# Patient Record
Sex: Female | Born: 1950 | Race: White | Hispanic: No | Smoking: Former smoker
Health system: Southern US, Community
[De-identification: ages and names within clinical notes are randomized; demographics above are authoritative.]

## PROBLEM LIST (undated history)

## (undated) DIAGNOSIS — K635 Polyp of colon: Secondary | ICD-10-CM

## (undated) DIAGNOSIS — D649 Anemia, unspecified: Secondary | ICD-10-CM

## (undated) DIAGNOSIS — K589 Irritable bowel syndrome without diarrhea: Secondary | ICD-10-CM

## (undated) DIAGNOSIS — R110 Nausea: Secondary | ICD-10-CM

## (undated) DIAGNOSIS — E785 Hyperlipidemia, unspecified: Secondary | ICD-10-CM

## (undated) DIAGNOSIS — R002 Palpitations: Secondary | ICD-10-CM

## (undated) DIAGNOSIS — J449 Chronic obstructive pulmonary disease, unspecified: Secondary | ICD-10-CM

## (undated) DIAGNOSIS — N281 Cyst of kidney, acquired: Secondary | ICD-10-CM

## (undated) DIAGNOSIS — D329 Benign neoplasm of meninges, unspecified: Secondary | ICD-10-CM

## (undated) DIAGNOSIS — R911 Solitary pulmonary nodule: Secondary | ICD-10-CM

## (undated) DIAGNOSIS — L570 Actinic keratosis: Secondary | ICD-10-CM

## (undated) DIAGNOSIS — J358 Other chronic diseases of tonsils and adenoids: Secondary | ICD-10-CM

## (undated) HISTORY — DX: Hyperlipidemia, unspecified: E78.5

## (undated) HISTORY — DX: Actinic keratosis: L57.0

## (undated) HISTORY — DX: Solitary pulmonary nodule: R91.1

## (undated) HISTORY — DX: Anemia, unspecified: D64.9

## (undated) HISTORY — DX: Benign neoplasm of meninges, unspecified: D32.9

## (undated) HISTORY — DX: Nausea: R11.0

## (undated) HISTORY — DX: Irritable bowel syndrome, unspecified: K58.9

## (undated) HISTORY — PX: VAGINAL HYSTERECTOMY: SUR661

## (undated) HISTORY — DX: Cyst of kidney, acquired: N28.1

## (undated) HISTORY — DX: Chronic obstructive pulmonary disease, unspecified: J44.9

## (undated) HISTORY — DX: Palpitations: R00.2

## (undated) HISTORY — DX: Polyp of colon: K63.5

## (undated) HISTORY — DX: Other chronic diseases of tonsils and adenoids: J35.8

## (undated) HISTORY — PX: COLONOSCOPY: SHX174

## (undated) HISTORY — PX: ESOPHAGOGASTRODUODENOSCOPY: SHX1529

---

## 1998-09-14 ENCOUNTER — Encounter (INDEPENDENT_AMBULATORY_CARE_PROVIDER_SITE_OTHER): Payer: Self-pay | Admitting: Internal Medicine

## 1998-09-14 LAB — CONVERTED CEMR LAB

## 2001-09-26 ENCOUNTER — Other Ambulatory Visit: Admission: RE | Admit: 2001-09-26 | Discharge: 2001-09-26 | Payer: Self-pay | Admitting: Obstetrics and Gynecology

## 2001-10-03 ENCOUNTER — Encounter: Admission: RE | Admit: 2001-10-03 | Discharge: 2001-10-03 | Payer: Self-pay | Admitting: Obstetrics and Gynecology

## 2001-10-03 ENCOUNTER — Encounter: Payer: Self-pay | Admitting: Obstetrics and Gynecology

## 2002-07-31 ENCOUNTER — Emergency Department (HOSPITAL_COMMUNITY): Admission: EM | Admit: 2002-07-31 | Discharge: 2002-07-31 | Payer: Self-pay

## 2002-10-02 ENCOUNTER — Other Ambulatory Visit: Admission: RE | Admit: 2002-10-02 | Discharge: 2002-10-02 | Payer: Self-pay | Admitting: Obstetrics and Gynecology

## 2002-10-09 ENCOUNTER — Encounter: Admission: RE | Admit: 2002-10-09 | Discharge: 2002-10-09 | Payer: Self-pay | Admitting: Obstetrics and Gynecology

## 2002-10-09 ENCOUNTER — Encounter: Payer: Self-pay | Admitting: Obstetrics and Gynecology

## 2003-10-06 ENCOUNTER — Other Ambulatory Visit: Admission: RE | Admit: 2003-10-06 | Discharge: 2003-10-06 | Payer: Self-pay | Admitting: Obstetrics and Gynecology

## 2003-10-11 ENCOUNTER — Encounter: Admission: RE | Admit: 2003-10-11 | Discharge: 2003-10-11 | Payer: Self-pay | Admitting: Obstetrics and Gynecology

## 2003-10-22 ENCOUNTER — Encounter (HOSPITAL_COMMUNITY): Admission: RE | Admit: 2003-10-22 | Discharge: 2004-01-20 | Payer: Self-pay | Admitting: Family Medicine

## 2003-10-30 LAB — FECAL OCCULT BLOOD, GUAIAC: Fecal Occult Blood: NEGATIVE

## 2004-06-05 ENCOUNTER — Encounter: Admission: RE | Admit: 2004-06-05 | Discharge: 2004-06-05 | Payer: Self-pay | Admitting: Orthopedic Surgery

## 2004-10-11 ENCOUNTER — Ambulatory Visit: Payer: Self-pay | Admitting: Internal Medicine

## 2004-10-12 ENCOUNTER — Encounter: Admission: RE | Admit: 2004-10-12 | Discharge: 2004-10-12 | Payer: Self-pay | Admitting: Family Medicine

## 2004-10-12 ENCOUNTER — Other Ambulatory Visit: Admission: RE | Admit: 2004-10-12 | Discharge: 2004-10-12 | Payer: Self-pay | Admitting: Obstetrics and Gynecology

## 2004-12-15 ENCOUNTER — Ambulatory Visit (HOSPITAL_COMMUNITY): Admission: RE | Admit: 2004-12-15 | Discharge: 2004-12-15 | Payer: Self-pay | Admitting: Endocrinology

## 2004-12-29 ENCOUNTER — Ambulatory Visit (HOSPITAL_COMMUNITY): Admission: RE | Admit: 2004-12-29 | Discharge: 2004-12-29 | Payer: Self-pay | Admitting: Endocrinology

## 2005-01-04 ENCOUNTER — Ambulatory Visit: Payer: Self-pay | Admitting: Family Medicine

## 2005-01-29 ENCOUNTER — Encounter: Admission: RE | Admit: 2005-01-29 | Discharge: 2005-01-29 | Payer: Self-pay | Admitting: General Surgery

## 2005-06-28 ENCOUNTER — Ambulatory Visit: Payer: Self-pay | Admitting: Unknown Physician Specialty

## 2005-07-04 ENCOUNTER — Ambulatory Visit: Payer: Self-pay | Admitting: Unknown Physician Specialty

## 2005-08-13 ENCOUNTER — Ambulatory Visit (HOSPITAL_COMMUNITY): Admission: RE | Admit: 2005-08-13 | Discharge: 2005-08-13 | Payer: Self-pay | Admitting: Endocrinology

## 2005-08-17 ENCOUNTER — Ambulatory Visit: Payer: Self-pay | Admitting: Unknown Physician Specialty

## 2005-09-03 ENCOUNTER — Encounter (INDEPENDENT_AMBULATORY_CARE_PROVIDER_SITE_OTHER): Payer: Self-pay | Admitting: Specialist

## 2005-09-03 ENCOUNTER — Other Ambulatory Visit: Admission: RE | Admit: 2005-09-03 | Discharge: 2005-09-03 | Payer: Self-pay | Admitting: Interventional Radiology

## 2005-09-03 ENCOUNTER — Encounter: Admission: RE | Admit: 2005-09-03 | Discharge: 2005-09-03 | Payer: Self-pay | Admitting: Endocrinology

## 2005-09-05 ENCOUNTER — Ambulatory Visit: Payer: Self-pay | Admitting: Family Medicine

## 2005-10-10 ENCOUNTER — Ambulatory Visit: Payer: Self-pay | Admitting: Family Medicine

## 2005-10-12 ENCOUNTER — Other Ambulatory Visit: Admission: RE | Admit: 2005-10-12 | Discharge: 2005-10-12 | Payer: Self-pay | Admitting: Obstetrics and Gynecology

## 2005-10-12 ENCOUNTER — Encounter: Admission: RE | Admit: 2005-10-12 | Discharge: 2005-10-12 | Payer: Self-pay | Admitting: Obstetrics and Gynecology

## 2005-10-26 ENCOUNTER — Ambulatory Visit: Payer: Self-pay | Admitting: Family Medicine

## 2005-11-13 ENCOUNTER — Ambulatory Visit: Payer: Self-pay | Admitting: Family Medicine

## 2005-11-15 HISTORY — PX: THYROIDECTOMY: SHX17

## 2005-11-19 ENCOUNTER — Ambulatory Visit (HOSPITAL_COMMUNITY): Admission: RE | Admit: 2005-11-19 | Discharge: 2005-11-20 | Payer: Self-pay | Admitting: Surgery

## 2005-11-19 ENCOUNTER — Encounter (INDEPENDENT_AMBULATORY_CARE_PROVIDER_SITE_OTHER): Payer: Self-pay | Admitting: Specialist

## 2006-01-01 ENCOUNTER — Encounter: Admission: RE | Admit: 2006-01-01 | Discharge: 2006-01-01 | Payer: Self-pay | Admitting: Surgery

## 2006-02-18 ENCOUNTER — Ambulatory Visit: Payer: Self-pay | Admitting: Family Medicine

## 2006-03-04 ENCOUNTER — Encounter: Admission: RE | Admit: 2006-03-04 | Discharge: 2006-03-04 | Payer: Self-pay | Admitting: Allergy

## 2006-04-23 ENCOUNTER — Ambulatory Visit: Payer: Self-pay | Admitting: Family Medicine

## 2006-07-26 ENCOUNTER — Ambulatory Visit: Payer: Self-pay | Admitting: Family Medicine

## 2006-10-14 ENCOUNTER — Ambulatory Visit: Payer: Self-pay | Admitting: Family Medicine

## 2006-10-14 ENCOUNTER — Encounter: Admission: RE | Admit: 2006-10-14 | Discharge: 2006-10-14 | Payer: Self-pay | Admitting: Family Medicine

## 2007-01-23 ENCOUNTER — Encounter: Payer: Self-pay | Admitting: Cardiovascular Disease

## 2007-02-05 ENCOUNTER — Encounter (INDEPENDENT_AMBULATORY_CARE_PROVIDER_SITE_OTHER): Payer: Self-pay | Admitting: Internal Medicine

## 2007-02-06 ENCOUNTER — Encounter (INDEPENDENT_AMBULATORY_CARE_PROVIDER_SITE_OTHER): Payer: Self-pay | Admitting: Internal Medicine

## 2007-02-14 ENCOUNTER — Encounter (INDEPENDENT_AMBULATORY_CARE_PROVIDER_SITE_OTHER): Payer: Self-pay | Admitting: Internal Medicine

## 2007-02-25 ENCOUNTER — Encounter (INDEPENDENT_AMBULATORY_CARE_PROVIDER_SITE_OTHER): Payer: Self-pay | Admitting: Internal Medicine

## 2007-02-25 HISTORY — PX: CARDIAC CATHETERIZATION: SHX172

## 2007-02-28 ENCOUNTER — Ambulatory Visit: Payer: Self-pay | Admitting: Otolaryngology

## 2007-02-28 ENCOUNTER — Encounter (INDEPENDENT_AMBULATORY_CARE_PROVIDER_SITE_OTHER): Payer: Self-pay | Admitting: Internal Medicine

## 2007-03-14 ENCOUNTER — Encounter (INDEPENDENT_AMBULATORY_CARE_PROVIDER_SITE_OTHER): Payer: Self-pay | Admitting: Internal Medicine

## 2007-03-18 ENCOUNTER — Ambulatory Visit: Payer: Self-pay | Admitting: Family Medicine

## 2007-03-18 DIAGNOSIS — I471 Supraventricular tachycardia, unspecified: Secondary | ICD-10-CM | POA: Insufficient documentation

## 2007-03-18 DIAGNOSIS — E0590 Thyrotoxicosis, unspecified without thyrotoxic crisis or storm: Secondary | ICD-10-CM | POA: Insufficient documentation

## 2007-03-19 ENCOUNTER — Encounter: Payer: Self-pay | Admitting: Pulmonary Disease

## 2007-03-19 ENCOUNTER — Ambulatory Visit: Payer: Self-pay

## 2007-03-24 ENCOUNTER — Encounter (INDEPENDENT_AMBULATORY_CARE_PROVIDER_SITE_OTHER): Payer: Self-pay | Admitting: Internal Medicine

## 2007-03-28 ENCOUNTER — Encounter: Payer: Self-pay | Admitting: Cardiovascular Disease

## 2007-04-01 ENCOUNTER — Telehealth (INDEPENDENT_AMBULATORY_CARE_PROVIDER_SITE_OTHER): Payer: Self-pay | Admitting: Internal Medicine

## 2007-04-25 ENCOUNTER — Ambulatory Visit: Payer: Self-pay | Admitting: Podiatry

## 2007-04-25 HISTORY — PX: FOOT SURGERY: SHX648

## 2007-05-16 ENCOUNTER — Ambulatory Visit: Payer: Self-pay | Admitting: Family Medicine

## 2007-05-16 LAB — CONVERTED CEMR LAB
Bilirubin Urine: NEGATIVE
Glucose, Urine, Semiquant: NEGATIVE
Ketones, urine, test strip: NEGATIVE
Nitrite: NEGATIVE
Protein, U semiquant: NEGATIVE
Specific Gravity, Urine: 1.02
Urobilinogen, UA: NEGATIVE
pH: 6

## 2007-06-12 ENCOUNTER — Encounter (INDEPENDENT_AMBULATORY_CARE_PROVIDER_SITE_OTHER): Payer: Self-pay | Admitting: Internal Medicine

## 2007-06-19 ENCOUNTER — Ambulatory Visit: Payer: Self-pay | Admitting: Internal Medicine

## 2007-06-19 DIAGNOSIS — J019 Acute sinusitis, unspecified: Secondary | ICD-10-CM | POA: Insufficient documentation

## 2007-07-17 ENCOUNTER — Encounter (INDEPENDENT_AMBULATORY_CARE_PROVIDER_SITE_OTHER): Payer: Self-pay | Admitting: Internal Medicine

## 2007-08-04 ENCOUNTER — Telehealth (INDEPENDENT_AMBULATORY_CARE_PROVIDER_SITE_OTHER): Payer: Self-pay | Admitting: *Deleted

## 2007-08-11 ENCOUNTER — Ambulatory Visit: Payer: Self-pay | Admitting: Family Medicine

## 2007-08-13 ENCOUNTER — Encounter (INDEPENDENT_AMBULATORY_CARE_PROVIDER_SITE_OTHER): Payer: Self-pay | Admitting: Internal Medicine

## 2007-08-13 ENCOUNTER — Telehealth (INDEPENDENT_AMBULATORY_CARE_PROVIDER_SITE_OTHER): Payer: Self-pay | Admitting: *Deleted

## 2007-08-13 DIAGNOSIS — M3500 Sicca syndrome, unspecified: Secondary | ICD-10-CM | POA: Insufficient documentation

## 2007-08-13 DIAGNOSIS — Z860100 Personal history of colon polyps, unspecified: Secondary | ICD-10-CM | POA: Insufficient documentation

## 2007-08-13 DIAGNOSIS — F17200 Nicotine dependence, unspecified, uncomplicated: Secondary | ICD-10-CM | POA: Insufficient documentation

## 2007-08-13 DIAGNOSIS — F410 Panic disorder [episodic paroxysmal anxiety] without agoraphobia: Secondary | ICD-10-CM | POA: Insufficient documentation

## 2007-08-13 DIAGNOSIS — N6019 Diffuse cystic mastopathy of unspecified breast: Secondary | ICD-10-CM | POA: Insufficient documentation

## 2007-08-13 DIAGNOSIS — E039 Hypothyroidism, unspecified: Secondary | ICD-10-CM | POA: Insufficient documentation

## 2007-09-01 ENCOUNTER — Encounter: Admission: RE | Admit: 2007-09-01 | Discharge: 2007-09-01 | Payer: Self-pay | Admitting: Family Medicine

## 2007-09-01 ENCOUNTER — Encounter (INDEPENDENT_AMBULATORY_CARE_PROVIDER_SITE_OTHER): Payer: Self-pay | Admitting: Internal Medicine

## 2007-09-01 ENCOUNTER — Encounter: Admission: RE | Admit: 2007-09-01 | Discharge: 2007-09-01 | Payer: Self-pay | Admitting: Neurosurgery

## 2007-09-15 ENCOUNTER — Encounter (INDEPENDENT_AMBULATORY_CARE_PROVIDER_SITE_OTHER): Payer: Self-pay | Admitting: Internal Medicine

## 2007-09-30 ENCOUNTER — Encounter (INDEPENDENT_AMBULATORY_CARE_PROVIDER_SITE_OTHER): Payer: Self-pay | Admitting: Internal Medicine

## 2007-09-30 ENCOUNTER — Encounter: Admission: RE | Admit: 2007-09-30 | Discharge: 2007-09-30 | Payer: Self-pay | Admitting: Obstetrics and Gynecology

## 2007-09-30 ENCOUNTER — Encounter (INDEPENDENT_AMBULATORY_CARE_PROVIDER_SITE_OTHER): Payer: Self-pay | Admitting: *Deleted

## 2007-10-01 ENCOUNTER — Encounter (INDEPENDENT_AMBULATORY_CARE_PROVIDER_SITE_OTHER): Payer: Self-pay | Admitting: Internal Medicine

## 2007-10-07 ENCOUNTER — Ambulatory Visit: Payer: Self-pay | Admitting: Family Medicine

## 2007-10-07 DIAGNOSIS — M6281 Muscle weakness (generalized): Secondary | ICD-10-CM | POA: Insufficient documentation

## 2007-10-07 DIAGNOSIS — M25559 Pain in unspecified hip: Secondary | ICD-10-CM | POA: Insufficient documentation

## 2007-10-08 ENCOUNTER — Encounter (INDEPENDENT_AMBULATORY_CARE_PROVIDER_SITE_OTHER): Payer: Self-pay | Admitting: Internal Medicine

## 2008-03-02 ENCOUNTER — Telehealth (INDEPENDENT_AMBULATORY_CARE_PROVIDER_SITE_OTHER): Payer: Self-pay | Admitting: Internal Medicine

## 2008-03-22 ENCOUNTER — Ambulatory Visit: Payer: Self-pay | Admitting: Family Medicine

## 2008-03-22 ENCOUNTER — Telehealth (INDEPENDENT_AMBULATORY_CARE_PROVIDER_SITE_OTHER): Payer: Self-pay | Admitting: Internal Medicine

## 2008-03-22 DIAGNOSIS — M2550 Pain in unspecified joint: Secondary | ICD-10-CM | POA: Insufficient documentation

## 2008-03-22 DIAGNOSIS — E162 Hypoglycemia, unspecified: Secondary | ICD-10-CM | POA: Insufficient documentation

## 2008-03-23 ENCOUNTER — Encounter (INDEPENDENT_AMBULATORY_CARE_PROVIDER_SITE_OTHER): Payer: Self-pay | Admitting: Internal Medicine

## 2008-03-26 ENCOUNTER — Telehealth (INDEPENDENT_AMBULATORY_CARE_PROVIDER_SITE_OTHER): Payer: Self-pay | Admitting: Internal Medicine

## 2008-04-13 ENCOUNTER — Encounter (INDEPENDENT_AMBULATORY_CARE_PROVIDER_SITE_OTHER): Payer: Self-pay | Admitting: Internal Medicine

## 2008-04-14 DIAGNOSIS — IMO0002 Reserved for concepts with insufficient information to code with codable children: Secondary | ICD-10-CM | POA: Insufficient documentation

## 2008-04-14 DIAGNOSIS — M1710 Unilateral primary osteoarthritis, unspecified knee: Secondary | ICD-10-CM

## 2008-04-14 DIAGNOSIS — E559 Vitamin D deficiency, unspecified: Secondary | ICD-10-CM | POA: Insufficient documentation

## 2008-04-14 DIAGNOSIS — M1990 Unspecified osteoarthritis, unspecified site: Secondary | ICD-10-CM | POA: Insufficient documentation

## 2008-08-06 ENCOUNTER — Ambulatory Visit: Payer: Self-pay | Admitting: Family Medicine

## 2008-08-06 DIAGNOSIS — R413 Other amnesia: Secondary | ICD-10-CM | POA: Insufficient documentation

## 2008-08-16 ENCOUNTER — Ambulatory Visit: Payer: Self-pay | Admitting: Pulmonary Disease

## 2008-08-16 DIAGNOSIS — G4733 Obstructive sleep apnea (adult) (pediatric): Secondary | ICD-10-CM | POA: Insufficient documentation

## 2008-08-18 ENCOUNTER — Telehealth (INDEPENDENT_AMBULATORY_CARE_PROVIDER_SITE_OTHER): Payer: Self-pay | Admitting: *Deleted

## 2008-08-19 ENCOUNTER — Ambulatory Visit: Payer: Self-pay | Admitting: Family Medicine

## 2008-08-19 ENCOUNTER — Encounter (INDEPENDENT_AMBULATORY_CARE_PROVIDER_SITE_OTHER): Payer: Self-pay | Admitting: Internal Medicine

## 2008-08-30 ENCOUNTER — Encounter: Admission: RE | Admit: 2008-08-30 | Discharge: 2008-08-30 | Payer: Self-pay | Admitting: Neurosurgery

## 2008-09-01 ENCOUNTER — Telehealth: Payer: Self-pay | Admitting: Pulmonary Disease

## 2008-09-13 ENCOUNTER — Encounter: Admission: RE | Admit: 2008-09-13 | Discharge: 2008-09-13 | Payer: Self-pay | Admitting: Family Medicine

## 2008-09-13 ENCOUNTER — Ambulatory Visit: Payer: Self-pay | Admitting: Pulmonary Disease

## 2008-09-14 ENCOUNTER — Encounter (INDEPENDENT_AMBULATORY_CARE_PROVIDER_SITE_OTHER): Payer: Self-pay | Admitting: *Deleted

## 2008-09-17 ENCOUNTER — Encounter (INDEPENDENT_AMBULATORY_CARE_PROVIDER_SITE_OTHER): Payer: Self-pay | Admitting: Internal Medicine

## 2008-09-29 ENCOUNTER — Ambulatory Visit: Payer: Self-pay | Admitting: Unknown Physician Specialty

## 2008-09-29 ENCOUNTER — Encounter (INDEPENDENT_AMBULATORY_CARE_PROVIDER_SITE_OTHER): Payer: Self-pay | Admitting: Internal Medicine

## 2008-10-06 ENCOUNTER — Telehealth (INDEPENDENT_AMBULATORY_CARE_PROVIDER_SITE_OTHER): Payer: Self-pay | Admitting: *Deleted

## 2008-10-28 ENCOUNTER — Encounter (INDEPENDENT_AMBULATORY_CARE_PROVIDER_SITE_OTHER): Payer: Self-pay | Admitting: Internal Medicine

## 2008-10-29 ENCOUNTER — Telehealth: Payer: Self-pay | Admitting: Pulmonary Disease

## 2008-11-16 ENCOUNTER — Telehealth: Payer: Self-pay | Admitting: Pulmonary Disease

## 2008-11-23 ENCOUNTER — Ambulatory Visit: Payer: Self-pay | Admitting: Family Medicine

## 2008-11-23 ENCOUNTER — Encounter (INDEPENDENT_AMBULATORY_CARE_PROVIDER_SITE_OTHER): Payer: Self-pay | Admitting: Internal Medicine

## 2008-11-25 ENCOUNTER — Encounter (INDEPENDENT_AMBULATORY_CARE_PROVIDER_SITE_OTHER): Payer: Self-pay | Admitting: *Deleted

## 2009-03-04 ENCOUNTER — Encounter (INDEPENDENT_AMBULATORY_CARE_PROVIDER_SITE_OTHER): Payer: Self-pay | Admitting: Internal Medicine

## 2009-04-22 ENCOUNTER — Encounter (INDEPENDENT_AMBULATORY_CARE_PROVIDER_SITE_OTHER): Payer: Self-pay | Admitting: Internal Medicine

## 2009-06-06 ENCOUNTER — Encounter (INDEPENDENT_AMBULATORY_CARE_PROVIDER_SITE_OTHER): Payer: Self-pay | Admitting: Internal Medicine

## 2009-06-28 ENCOUNTER — Encounter (INDEPENDENT_AMBULATORY_CARE_PROVIDER_SITE_OTHER): Payer: Self-pay | Admitting: Internal Medicine

## 2009-06-29 ENCOUNTER — Ambulatory Visit: Payer: Self-pay | Admitting: Family Medicine

## 2009-07-05 ENCOUNTER — Telehealth (INDEPENDENT_AMBULATORY_CARE_PROVIDER_SITE_OTHER): Payer: Self-pay | Admitting: Internal Medicine

## 2009-07-05 LAB — CONVERTED CEMR LAB
BUN: 6 mg/dL (ref 6–23)
Basophils Absolute: 0 10*3/uL (ref 0.0–0.1)
Basophils Relative: 0.4 % (ref 0.0–3.0)
CO2: 29 meq/L (ref 19–32)
Calcium: 8.8 mg/dL (ref 8.4–10.5)
Chloride: 105 meq/L (ref 96–112)
Creatinine, Ser: 0.6 mg/dL (ref 0.4–1.2)
Eosinophils Absolute: 0 10*3/uL (ref 0.0–0.7)
Eosinophils Relative: 1.2 % (ref 0.0–5.0)
GFR calc non Af Amer: 108.63 mL/min (ref >60)
Glucose, Bld: 83 mg/dL (ref 70–99)
HCT: 38.3 % (ref 36.0–46.0)
Hemoglobin: 13 g/dL (ref 12.0–15.0)
Lymphocytes Relative: 33.2 % (ref 12.0–46.0)
Lymphs Abs: 1.1 10*3/uL (ref 0.7–4.0)
MCHC: 33.8 g/dL (ref 30.0–36.0)
MCV: 98.3 fL (ref 78.0–100.0)
Monocytes Absolute: 0.3 10*3/uL (ref 0.1–1.0)
Monocytes Relative: 8 % (ref 3.0–12.0)
Neutro Abs: 1.8 10*3/uL (ref 1.4–7.7)
Neutrophils Relative %: 57.2 % (ref 43.0–77.0)
Platelets: 149 10*3/uL — ABNORMAL LOW (ref 150.0–400.0)
Potassium: 4.4 meq/L (ref 3.5–5.1)
RBC: 3.9 M/uL (ref 3.87–5.11)
RDW: 12.1 % (ref 11.5–14.6)
Sodium: 142 meq/L (ref 135–145)
WBC: 3.2 10*3/uL — ABNORMAL LOW (ref 4.5–10.5)

## 2009-07-25 ENCOUNTER — Encounter (INDEPENDENT_AMBULATORY_CARE_PROVIDER_SITE_OTHER): Payer: Self-pay | Admitting: Internal Medicine

## 2009-08-09 ENCOUNTER — Encounter (INDEPENDENT_AMBULATORY_CARE_PROVIDER_SITE_OTHER): Payer: Self-pay | Admitting: Internal Medicine

## 2009-08-11 ENCOUNTER — Telehealth (INDEPENDENT_AMBULATORY_CARE_PROVIDER_SITE_OTHER): Payer: Self-pay | Admitting: Internal Medicine

## 2009-08-18 ENCOUNTER — Encounter (INDEPENDENT_AMBULATORY_CARE_PROVIDER_SITE_OTHER): Payer: Self-pay | Admitting: Internal Medicine

## 2009-08-19 ENCOUNTER — Ambulatory Visit: Payer: Self-pay | Admitting: Family Medicine

## 2009-08-19 DIAGNOSIS — M8580 Other specified disorders of bone density and structure, unspecified site: Secondary | ICD-10-CM | POA: Insufficient documentation

## 2009-08-19 DIAGNOSIS — E785 Hyperlipidemia, unspecified: Secondary | ICD-10-CM | POA: Insufficient documentation

## 2009-09-16 ENCOUNTER — Encounter: Admission: RE | Admit: 2009-09-16 | Discharge: 2009-09-16 | Payer: Self-pay | Admitting: Obstetrics and Gynecology

## 2009-09-23 ENCOUNTER — Encounter (INDEPENDENT_AMBULATORY_CARE_PROVIDER_SITE_OTHER): Payer: Self-pay | Admitting: Internal Medicine

## 2009-10-17 ENCOUNTER — Telehealth (INDEPENDENT_AMBULATORY_CARE_PROVIDER_SITE_OTHER): Payer: Self-pay | Admitting: Internal Medicine

## 2010-01-26 ENCOUNTER — Encounter: Payer: Self-pay | Admitting: Cardiovascular Disease

## 2010-02-13 ENCOUNTER — Telehealth: Payer: Self-pay | Admitting: Cardiovascular Disease

## 2010-02-24 ENCOUNTER — Telehealth: Payer: Self-pay | Admitting: Cardiovascular Disease

## 2010-02-24 ENCOUNTER — Ambulatory Visit: Payer: Self-pay | Admitting: Cardiovascular Disease

## 2010-02-24 DIAGNOSIS — R000 Tachycardia, unspecified: Secondary | ICD-10-CM | POA: Insufficient documentation

## 2010-02-27 ENCOUNTER — Encounter: Payer: Self-pay | Admitting: Cardiovascular Disease

## 2010-03-22 ENCOUNTER — Telehealth: Payer: Self-pay | Admitting: Cardiovascular Disease

## 2010-03-28 ENCOUNTER — Encounter: Payer: Self-pay | Admitting: Cardiovascular Disease

## 2010-03-28 ENCOUNTER — Telehealth: Payer: Self-pay | Admitting: Family Medicine

## 2010-03-29 ENCOUNTER — Telehealth: Payer: Self-pay | Admitting: Family Medicine

## 2010-04-06 ENCOUNTER — Encounter: Payer: Self-pay | Admitting: Cardiovascular Disease

## 2010-04-12 ENCOUNTER — Telehealth: Payer: Self-pay | Admitting: Cardiovascular Disease

## 2010-06-08 ENCOUNTER — Telehealth: Payer: Self-pay | Admitting: Cardiovascular Disease

## 2010-06-13 ENCOUNTER — Ambulatory Visit: Payer: Self-pay | Admitting: Family Medicine

## 2010-06-15 ENCOUNTER — Telehealth: Payer: Self-pay | Admitting: Family Medicine

## 2010-08-18 ENCOUNTER — Encounter: Payer: Self-pay | Admitting: Family Medicine

## 2010-08-23 ENCOUNTER — Telehealth: Payer: Self-pay | Admitting: Family Medicine

## 2010-08-28 ENCOUNTER — Ambulatory Visit: Payer: Self-pay | Admitting: Cardiovascular Disease

## 2010-08-29 ENCOUNTER — Ambulatory Visit: Payer: Self-pay | Admitting: Family Medicine

## 2010-08-29 DIAGNOSIS — R3915 Urgency of urination: Secondary | ICD-10-CM | POA: Insufficient documentation

## 2010-08-29 LAB — CONVERTED CEMR LAB
Bilirubin Urine: NEGATIVE
Blood in Urine, dipstick: NEGATIVE
Glucose, Urine, Semiquant: NEGATIVE
Ketones, urine, test strip: NEGATIVE
Nitrite: NEGATIVE
Specific Gravity, Urine: 1.015
Urobilinogen, UA: 0.2
WBC Urine, dipstick: NEGATIVE
pH: 7

## 2010-09-10 ENCOUNTER — Encounter: Admission: RE | Admit: 2010-09-10 | Discharge: 2010-09-10 | Payer: Self-pay | Admitting: Neurosurgery

## 2010-09-22 ENCOUNTER — Encounter
Admission: RE | Admit: 2010-09-22 | Discharge: 2010-09-22 | Payer: Self-pay | Source: Home / Self Care | Attending: Family Medicine | Admitting: Family Medicine

## 2010-09-22 ENCOUNTER — Encounter: Payer: Self-pay | Admitting: Family Medicine

## 2010-11-10 ENCOUNTER — Encounter: Payer: Self-pay | Admitting: Family Medicine

## 2010-11-14 NOTE — Progress Notes (Signed)
Summary: discussion about bipap...will d/c  Phone Note Call from Patient   Caller: Patient Call For: Keasia Dubose Summary of Call: wants to talk to dr Jettie Mannor re: bi-pap. best time to reach pt is between 12:30-1:00pm at [phone] or 4:45pm at [phone] or [phone]. Initial call taken by: Tivis Ringer,  November 16, 2008 10:36 AM  Follow-up for Phone Call        called and spoke with pt.  Pt is calling back regarding the message she left on 10-29-2008.  Pt states she is still having problems with bipap machine.  Pt states after approx 2 hours of wearing bipap the pressue goes up and ends up blowing mask off her face.  pt would like to discuss other options.    Pt states it's best to reach her between 12:30 to 1 at [phone]  or after  4:30pm at [phone] or [phone].  Please advise .Aundra Millet Reynolds LPN  November 16, 2008 10:52 AM   Additional Follow-up for Phone Call Additional follow up Details #1::        Finally able to speak with pt.  she is having continued intolerance to the pressure even with low level bipap.  I have had a long discussion with her...she and I have both come to the conclusion this is not a viable therapy for her.  she has been successful at losing weight, and is continuing to do so.  I also discussed with her the possibility of nasal surgery.  She will think about this.  I will send an order to dme to d/c bipap Additional Follow-up by: Barbaraann Share MD,  November 17, 2008 5:45 PM

## 2010-11-14 NOTE — Progress Notes (Signed)
Summary: needs order for labwork  Phone Note Call from Patient Call back at [phone]   Caller: Patient Summary of Call: Pt is coming in november for her physical.  She is asking that order for labwork be mailed to her home.  She works at [employer]. Initial call taken by: Lowella Petties CMA,  March 29, 2010 10:09 AM  Follow-up for Phone Call        In my box. Follow-up by: Ruthe Mannan MD,  March 29, 2010 10:16 AM  Additional Follow-up for Phone Call Additional follow up Details #1::        Order mailed to patient's home address. Additional Follow-up by: Linde Gillis CMA Duncan Dull),  March 29, 2010 10:23 AM

## 2010-11-14 NOTE — Letter (Signed)
Summary: Results Follow up Letter  [place] [facility]  [address] [place], [place] [address]   Phone: [phone]  Fax: [phone]    09/14/2008 MRN: [id number]        [facility] [address] [place], [place]  [address]      Dear Meghan Mack,  The following are the results of your recent test(s):  Test         Result    Pap Smear:        Normal _____  Not Normal _____ Comments: ______________________________________________________ Cholesterol: LDL(Bad cholesterol):         Your goal is less than:         HDL (Good cholesterol):       Your goal is more than: Comments:  ______________________________________________________ Mammogram:        Normal __X___  Not Normal _____ Comments:  ___________________________________________________________________ Hemoccult:        Normal _____  Not normal _______ Comments:    _____________________________________________________________________ Other Tests:    We routinely do not discuss normal results over the telephone.  If you desire a copy of the results, or you have any questions about this information we can discuss them at your next office visit.   Sincerely,   Billie-Lynn Garner Nash Bean,FNP

## 2010-11-14 NOTE — Assessment & Plan Note (Signed)
Summary: cpx/cmt   Vital Signs:  Patient Profile:   60 Years Old Female Height:     66.75 inches Weight:      168 pounds Temp:     97.9 degrees F oral Pulse rate:   72 / minute Pulse rhythm:   regular BP sitting:   120 / 82  (left arm) Cuff size:   regular  Vitals Entered By: Lowella Petties (August 06, 2008 8:45 AM)                 Chief Complaint:  CPX.  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  was found to have hypo Vit D and started on 2000mg  of vit D daily.  --to see Dr Jule Ser in 12/09--if no increase this time in size of meningioma will be seen in 1 yr--MRI to be done 11/09.  --continues to wake gasping for air, not nightly but 3xwk.  Had sleep study done this past summer in [place], ordered by cardio--Dr McQueen--[facility]--now Dr Sonnie Alamo will call them for referral.  --daughter has noted memory loss, no one at work has said anything--last eval 4/09--excellent.  See ROS.  --has stopped paxil for 2wks--wants to be off due to wanting to loose wt--was taking for anxiety, so far so good.  has joined [employer] and is loosing.    Current Allergies: ! SULFA ! CODEINE ! * NEXIUM ! * DECONGESTANTS ! PROPANTHELINE BROMIDE (PROPANTHELINE BROMIDE)  Past Surgical History:    Reviewed history from 09/29/2007 and no changes required:       R foot surgery 04/25/07       Hysterectomy (ovaries intact)              EGD - gastritis-- 01/99/// 11/06       Colonoscopy negative-- 01/99/// polyps 11/06       DEXA - wn-- 02/13/02       MRI L/S basically-- wnl       MRI pelvis, right hip ? lesion        Thyroidectomy, total--02/07       MRI brain- meningeal based mass--02/28/07       EMG--egative--02/14/07       Cardiac cath-- negative --02/25/07       2 D Echo- borderline mitral valve prolapse with MR--5/03       Holter  negative-- 1995/// palpitations-- 04/08                                                                                                        MRI brain with and without contrast--09/01/07--[facility]--probalbl neg   Social History:    Reviewed history from 03/18/2007 and no changes required:       Marital Status: Married       Children: 2       Occupation: Journalist, newspaper, [employer]   Risk Factors:  Tobacco use:  quit    Year quit:  1998 Passive smoke exposure:  no Drug use:  no HIV high-risk behavior:  no Caffeine use:  2 drinks per day Alcohol use:  no Exercise:  yes    Times per week:  5    Type:  walks 5xwk for Seatbelt use:  100 % Sun Exposure:  rarely  Mammogram History:    Date of Last Mammogram:  10/14/2006  PAP Smear History:    Date of Last PAP Smear:  09/14/1998   Review of Systems  Eyes      eye doc 3/09--no cats, ? early glaucoma--new rx needed  CV      Complains of palpitations.      Denies chest pain or discomfort, swelling of feet, and swelling of hands.      Beta blocker helping with increased rate  Resp      See HPI      Denies cough, shortness of breath, and wheezing.  GI      Complains of constipation.      Denies abdominal pain, bloody stools, diarrhea, nausea, and vomiting.      colonoscopy due 12/09--Dr Elliott--had polyps last time done  MS      See HPI  Derm      Denies lesion(s) and rash.      uses sun skin at times of increased exposure  Neuro      Complains of difficulty with concentration and memory loss.      Denies disturbances in coordination, falling down, tremors, and weakness.      not sleeping well --memory--problems with birthdates--new, has not gotten lost in car, does not burn food, husband pays the bills,   Psych      Denies anxiety and depression.   Physical Exam  General:     alert, well-developed, well-nourished, and well-hydrated.  10 lb wt loss since 6/09 Neck:     thyroid surgically  absentno masses, no JVD, and no carotid bruits.   Lungs:     normal respiratory effort, no intercostal retractions, no accessory muscle use, and normal breath sounds.   Heart:     normal rate, regular rhythm, and no murmur.   Abdomen:     soft, non-tender, normal bowel sounds, no distention, no masses, no guarding, no abdominal hernia, no inguinal hernia, no hepatomegaly, and no splenomegaly.   Msk:     no joint tenderness, no joint swelling, no joint warmth, and no redness over joints.  both knees have boggy appearance Extremities:     no edema either lower  leg Neurologic:     alert & oriented X3, strength normal in all extremities, sensation intact to light touch, gait normal, and DTRs symmetrical and normal.   Skin:     turgor normal, color normal, and no rashes.   Cervical Nodes:     no anterior cervical adenopathy and no posterior cervical adenopathy.   Inguinal Nodes:     no R inguinal adenopathy and no L inguinal adenopathy.   Psych:     normally interactive.      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) Assessment: New will schedule in for memory testing all blood work except RPR has been done recently by Dr Jimmy Footman or myself to have MRI of head in 11/09--had one within last yr--neg  except for miningioma  Problem # 3:  VITAMIN D DEFICIENCY (ICD-268.9) Assessment: New taking additional vit  D per Dr Selinda Eon note of 7/09 was reviewed with her  Problem # 4:  PANIC ATTACK (ICD-300.01) Assessment: Comment Only took herself off paxil as she felt it contributed to her inability to loose wt--follow and restart if needed The following medications were removed from the medication list:    Paxil Cr 12.5 Mg Tb24 (Paroxetine hcl) .Marland Kitchen... 1 once daily   Complete Medication List: 1)  Bystolic 2.5mg   .... Take 1 tablet by mouth once a day 2)   Prevacid 30 Mg Cpdr (Lansoprazole) .... Take one by mouth once a day 3)  Zocor 20 Mg Tabs (Simvastatin) .... Take 1 tablet by mouth once a day 4)  Synthroid 75 Mcg Tabs (Levothyroxine sodium) .... Take 1 tablet by mouth once a day   Patient Instructions: 1)  schedule in for memory testing--30 min   ] Prior Medications (reviewed today): BYSTOLIC  2.5MG  () Take 1 tablet by mouth once a day PREVACID 30 MG  CPDR (LANSOPRAZOLE) Take one by mouth once a day ZOCOR 20 MG  TABS (SIMVASTATIN) Take 1 tablet by mouth once a day SYNTHROID 75 MCG  TABS (LEVOTHYROXINE SODIUM) Take 1 tablet by mouth once a day Current Allergies: ! SULFA ! CODEINE ! * NEXIUM ! * DECONGESTANTS ! PROPANTHELINE BROMIDE (PROPANTHELINE BROMIDE)

## 2010-11-14 NOTE — Progress Notes (Signed)
Summary: rxn to pnemonia vacc  Phone Note Call from Patient Call back at Work Phone [phone]   Caller: Patient Call For: bean Summary of Call: pt seen mon for pnemonia vacc she says her arm is extremely sore, but today in front of arm and down into armpit she has broken out in a rash that is really sore, no redness around injection site. please advise Initial call taken by: Liane Comber,  August 13, 2007 8:48 AM  Follow-up for Phone Call        needs to come in if not better tomorrow  ..................................................................Marland KitchenBillie-Lynn Tyler Deis FNP  August 13, 2007 1:47 PM   Additional Follow-up for Phone Call Additional follow up Details #1::        Advised patient.  ......................................................Marland KitchenLiane Comber August 13, 2007 2:02 PM

## 2010-11-14 NOTE — Letter (Signed)
Summary: Medical Record Release  Medical Record Release   Imported By: Harlon Flor 02/22/2010 14:14:46  _____________________________________________________________________  External Attachment:    Type:   Image     Comment:   External Document

## 2010-11-14 NOTE — Progress Notes (Signed)
Summary: pnemonia shot booster?  Phone Note Call from Patient Call back at Work Phone [phone]   Caller: Patient Call For: bean Summary of Call: pt had a pnemonia shot in 2002, pt wants to know if she needs a booster shot since it has been 6 years. Initial call taken by: Liane Comber,  August 04, 2007 1:25 PM  Follow-up for Phone Call        she can have 2 before the age of 40 and 1 after 25....can you help her with this--is ok to have if she meets this ..................................................................Marland KitchenBillie-Lynn Tyler Deis FNP  August 04, 2007 1:53 PM   Additional Follow-up for Phone Call Additional follow up Details #1::        Advised patient.  ......................................................Marland KitchenLiane Comber August 04, 2007 2:02 PM

## 2010-11-14 NOTE — Progress Notes (Signed)
Summary: ? Tamiflu  Phone Note Call from Patient Call back at Work Phone [phone]   Caller: Patient Call For: Everrett Coombe, FNP Summary of Call: Patient states that her grandchildren have been diagnosed at their Pediatrician's office with "flu".  She has been exposed to them.  Should she begin Tamiflu? Initial call taken by: Delilah Shan,  Mar 02, 2008 1:56 PM  Follow-up for Phone Call        --do we know what kind of "flu" the grandchildren have? --do9es not need Tamiflu unless intimate contact or begins to have symptoms   Billie-Lynn Tyler Deis FNP  Mar 02, 2008 2:01 PM   Additional Follow-up for Phone Call Additional follow up Details #1::        pc to pt, given instructions. Additional Follow-up by: Cooper Render,  Mar 02, 2008 2:57 PM

## 2010-11-14 NOTE — Assessment & Plan Note (Signed)
Summary: CPX/OK'D BY DR [place] Inthavong/CLE   Vital Signs:  Patient profile:   60 year old female Height:      67 inches Weight:      153 pounds BMI:     24.05 Temp:     97.5 degrees F oral Pulse rate:   80 / minute Pulse rhythm:   regular BP sitting:   140 / 80  (left arm) Cuff size:   regular  Vitals Entered By: Linde Gillis CMA Duncan Dull) (August 29, 2010 8:04 AM) CC: complete physicial, no pap   History of Present Illness: 60 yo here for CPX.  Labs reviewed with her- cholesterol, liver function, BMEt and Vit D look excellent. She would like to continue higher dose Vitamin D for a few more weeks because she had been in 2000 International Units daily and Vit D was very low.  Immunizations UTD. Scheduled for mammogram in december, pap smear tomorrow.    Urinary frequency- started a few days ago, has vaginal itching as well.  No dysuria, back pain, nause or vomiting.  No fevers.    Anxiety- has had panic attacks for years, used to be on Paxil. Now anxiety is much improved but under a great deal of stress at work and planning her son's wedding. Notices that certain situations make her very anxious and trigger a panic attack, a few times per month. Denies any symptoms of depression. No SI or HI. Does not want to take something daily.  Was ok at her son's wedding, no panic attacks.  Current Medications (verified): 1)  Bystolic 5 Mg Tabs (Nebivolol Hcl) .... Take 1/2 Tablet By Mouth Once A Day 2)  Synthroid 88 Mcg Tabs (Levothyroxine Sodium) .... Take 1 By Mouth Once Daily 3)  Nasonex 50 Mcg/act Susp (Mometasone Furoate) .... One Spray Each Nostril Once or Twice Daily 4)  Prevacid 15 Mg Cpdr (Lansoprazole) .... Take One To Two Tabs Daily As Needed 5)  Zocor 20 Mg Tabs (Simvastatin) .... Take One Tablet Daily 6)  Zantac 150 Mg Tabs (Ranitidine Hcl) .... As Needed 7)  Multivitamins   Tabs (Multiple Vitamin) .... One Tab Once Daily 8)  Probiotic  Caps (Probiotic Product) .... Once Daily 9)   Valium 5 Mg Tabs (Diazepam) .Marland Kitchen.. 1 Tab By Mouth Two Times A Day As Needed Anxiety 10)  Vitamin D3 50000 Unit Caps (Cholecalciferol) .Marland Kitchen.. 1 Tab By Mouth Weekly X 8 Weeks.  Allergies: 1)  ! Sulfa 2)  ! Codeine 3)  ! * Nexium 4)  ! * Decongestants 5)  ! Propantheline Bromide (Propantheline Bromide) 6)  ! Demerol (Meperidine Hcl) 7)  ! Darvocet  Past History:  Past Medical History: Last updated: 08/13/2007 COPD Colonic polyps, hx of  Past Surgical History: Last updated: 10/28/2008 R foot surgery 04/25/07 Hysterectomy (ovaries intact)  EGD - gastritis-- 01/99/// 11/06 Colonoscopy negative-- 01/99/// polyps 11/06--polyps 09/29/08 DEXA - wn-- 02/13/02 MRI L/S basically-- wnl MRI pelvis, right hip ? lesion  Thyroidectomy, total--02/07 MRI brain- meningeal based mass--02/28/07---minimal increase in mass 08/30/08 EMG--egative--02/14/07 Cardiac cath-- negative --02/25/07 2 D Echo- borderline mitral valve prolapse with MR--5/03 Holter negative-- 1995/// palpitations-- 04/08  MRI brain with and without contrast--09/01/07--[facility]--probalbl neg  Family History: Last updated: 02/07/2010 Family history - CAD, elevated BP, CVA, DM, lung cancer, COPD, palpitations  emphysema: father allergies: daughter heart disease: father rheumatism: mother cancer: brother (lung), mother (colon), father (leukemia), maternal grandfather (stomach), Maunt--melanoma   Social History: Last updated: 08/16/2008 Marital Status: Married Children: 2 Occupation: Journalist, newspaper, Clinical cytogeneticist 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   Risk Factors: Alcohol Use: 0 (08/19/2009) Caffeine Use: 3 (08/19/2009) Exercise: yes (08/19/2009)  Risk Factors: Smoking Status: quit (08/19/2009) Packs/Day: 0.5 (08/19/2009) Passive Smoke Exposure: no (08/06/2008)  Review of Systems      See HPI General:  Denies  malaise. Eyes:  Denies blurring. ENT:  Denies difficulty swallowing. CV:  Denies chest pain or discomfort. Resp:  Denies shortness of breath. GI:  Denies abdominal pain and bloody stools. GU:  Complains of urinary frequency; denies discharge and dysuria. MS:  Denies joint pain, joint redness, and joint swelling. Derm:  Denies rash. Neuro:  Denies headaches. Psych:  Complains of anxiety; denies depression. Endo:  Denies cold intolerance and heat intolerance.  Physical Exam  General:  well-appearing woman in no apparent distress, pleasant. Head:  Normocephalic and atraumatic without obvious abnormalities. No apparent alopecia or balding. Eyes:  vision grossly intact, pupils equal, pupils round, and pupils reactive to light.   Ears:  R ear normal and L ear normal.   Nose:  no external deformity.   Mouth:  good dentition.   Neck:  No deformities, masses, or tenderness noted. Lungs:  Normal respiratory effort, chest expands symmetrically. Lungs are clear to auscultation, no crackles or wheezes. Heart:  Normal rate and regular rhythm. S1 and S2 normal without gallop, murmur, click, rub or other extra sounds. Abdomen:  Bowel sounds positive,abdomen soft and non-tender without masses, organomegaly or hernias noted. NO CVA or suprapubic tenderness Msk:  No deformity or scoliosis noted of thoracic or lumbar spine.   Extremities:  no edema Neurologic:  alert & oriented X3 and gait normal.   Skin:  Intact without suspicious lesions or rashes Psych:  Cognition and judgment appear intact. Alert and cooperative with normal attention span and concentration. No apparent delusions, illusions, hallucinations   Impression & Recommendations:  Problem # 1:  WELL ADULT EXAM (ICD-V70.0) Reviewed preventive care protocols, scheduled due services, and updated immunizations Discussed nutrition, exercise, diet, and healthy lifestyle.  UTD on prevention.  Discussed labs.  Problem # 2:  URINARY URGENCY  (ZOX-096.04) Assessment: New UA neg. ? vaginal infection.   she deffered vaginal exam as she has appt with OBGYN tomorrow.    Complete Medication List: 1)  Bystolic 5 Mg Tabs (Nebivolol hcl) .... Take 1/2 tablet by mouth once a day 2)  Synthroid 88 Mcg Tabs (Levothyroxine sodium) .... Take 1 by mouth once daily 3)  Nasonex 50 Mcg/act Susp (Mometasone furoate) .... One spray each nostril once or twice daily 4)  Prevacid 15 Mg Cpdr (Lansoprazole) .... Take one to two tabs daily as needed 5)  Zocor 20 Mg Tabs (Simvastatin) .... Take one tablet daily 6)  Zantac 150 Mg Tabs (Ranitidine hcl) .... As needed 7)  Multivitamins Tabs (Multiple vitamin) .... One tab once daily 8)  Probiotic Caps (Probiotic product) .... Once daily 9)  Valium 5 Mg Tabs (Diazepam) .Marland Kitchen.. 1 tab by mouth two times a day as needed anxiety 10)  Vitamin D3 50000 Unit Caps (Cholecalciferol) .Marland Kitchen.. 1 tab by mouth weekly x 8 weeks.  Other Orders: UA Dipstick w/o Micro (manual) (54098)  Prescriptions: VITAMIN D3 50000 UNIT CAPS (CHOLECALCIFEROL) 1 tab by mouth weekly x 8 weeks.  #8 x 0   Entered and Authorized by:   Ruthe Mannan MD   Signed by:   Ruthe Mannan MD on 08/29/2010   Method used:   Electronically to        [facility] [id number][facility] (retail)       [address] [place], [place]  [address]       Ph: [phone] or [phone]       Fax: [phone]   RxID:   [id number]    Orders Added: 1)  UA Dipstick w/o Micro (manual) [81002] 2)  Est. Patient 40-64 years [34742]    Current Allergies (reviewed today): ! SULFA ! CODEINE ! * NEXIUM ! * DECONGESTANTS ! PROPANTHELINE BROMIDE (PROPANTHELINE BROMIDE) ! DEMEROL (MEPERIDINE HCL) ! DARVOCET    Laboratory Results   Urine Tests  Date/Time Received: August 29, 2010 8:25 AM   Routine Urinalysis   Color: yellow Appearance: Clear Glucose: negative   (Normal Range: Negative) Bilirubin: negative   (Normal Range: Negative) Ketone:  negative   (Normal Range: Negative) Spec. Gravity: 1.015   (Normal Range: 1.003-1.035) Blood: negative   (Normal Range: Negative) pH: 7.0   (Normal Range: 5.0-8.0) Protein: trace   (Normal Range: Negative) Urobilinogen: 0.2   (Normal Range: 0-1) Nitrite: negative   (Normal Range: Negative) Leukocyte Esterace: negative   (Normal Range: Negative)

## 2010-11-14 NOTE — Miscellaneous (Signed)
Summary: Mammogram screening  Clinical Lists Changes  Observations: Added new observation of MAMMO DUE: 09/2009 (08/18/2009 17:39) Added new observation of MAMMOGRAM: normal (09/13/2008 17:45)      Preventive Care Screening  Mammogram:    Date:  09/13/2008    Next Due:  09/2009    Results:  normal

## 2010-11-14 NOTE — Miscellaneous (Signed)
Summary: MRI brain 11/09  Clinical Lists Changes  Observations: Added new observation of PAST SURG HX: R foot surgery 04/25/07 Hysterectomy (ovaries intact)  EGD - gastritis-- 01/99/// 11/06 Colonoscopy negative-- 01/99/// polyps 11/06--polyps 09/29/08 DEXA - wn-- 02/13/02 MRI L/S basically-- wnl MRI pelvis, right hip ? lesion  Thyroidectomy, total--02/07 MRI brain- meningeal based mass--02/28/07---minimal increase in mass 08/30/08 EMG--egative--02/14/07 Cardiac cath-- negative --02/25/07 2 D Echo- borderline mitral valve prolapse with MR--5/03 Holter negative-- 1995/// palpitations-- 04/08                                                                                                        MRI brain with and without contrast--09/01/07--[facility]--probalbl neg  (10/28/2008 18:03)      Past Surgical History:    R foot surgery 04/25/07    Hysterectomy (ovaries intact)        EGD - gastritis-- 01/99/// 11/06    Colonoscopy negative-- 01/99/// polyps 11/06--polyps 09/29/08    DEXA - wn-- 02/13/02    MRI L/S basically-- wnl    MRI pelvis, right hip ? lesion     Thyroidectomy, total--02/07    MRI brain- meningeal based mass--02/28/07---minimal increase in mass 08/30/08    EMG--egative--02/14/07    Cardiac cath-- negative --02/25/07    2 D Echo- borderline mitral valve prolapse with MR--5/03    Holter negative-- 1995/// palpitations-- 04/08                                                                                                        MRI brain with and without contrast--09/01/07--[facility]--probalbl neg

## 2010-11-14 NOTE — Progress Notes (Signed)
Summary: wants order for UA  Phone Note Call from Patient Call back at [phone]   Caller: Patient Call For: Meghan Mannan MD Summary of Call: Patient says that she has been having burning with urination and urine frequency. I offered her an appt for today, she says that she is coming in next week for a cpx and doesn't want to come in right now. She is asking if she could get an order for a UA sent to [facility] which is where she works. She wants the order to be faxed to [phone] to her attention. Please advise.  Initial call taken by: Melody Comas,  August 23, 2010 8:41 AM  Follow-up for Phone Call        I think we were told that we could no longer do this right?  I will send to [facility] to verify. Meghan Mannan MD  August 23, 2010 8:48 AM  Verified with Aram Beecham, patients are no longer to come in and leave urine specimen if they think they have a UTI, needs to be seen.  Linde Gillis CMA Duncan Dull)  August 23, 2010 9:05 AM   Patient notified.  Follow-up by: Melody Comas,  August 23, 2010 9:20 AM

## 2010-11-14 NOTE — Assessment & Plan Note (Signed)
Summary: CPX/CLE   Vital Signs:  Patient profile:   60 year old female Height:      67.25 inches Weight:      152.50 pounds BMI:     23.79 Temp:     98.2 degrees F oral Pulse rate:   72 / minute Pulse rhythm:   regular BP sitting:   122 / 82  (left arm) Cuff size:   regular  Vitals Entered By: Lewanda Rife LPN (August 19, 2009 9:24 AM)  Primary Care Provider:  Everrett Coombe  CC:  complete physical .  History of Present Illness: Here for CPX--follow up of several medical problems --has appt with GYN for pap and breast --Has Vit D edff, currently o 50K qowk--Ca+ in diet--milk 4oz. yogurt--6 oz, both daily, no suppliment tabs, followed by DrBalan  --last colonoscopy 1/09--benign polyps--is under GI care, seen regulrily --last hemacult --1/05--neg --last mammo--1109--neg --last DEXA--9/09--osteopenia done by Dr Talmage Nap --last tetnus 10/3./2002 --last pneumonia vaccine--07/2007  --follow up of recent labs--LDL well conttrolled on zocor  --dizzy x 1 wk, got better, then lightening bolt in head, R ear full --seen at [facility] 07/25/2009 for palpitations--see note in chart  Preventive Screening-Counseling & Management  Alcohol-Tobacco     Alcohol drinks/day: 0     Smoking Status: quit     Packs/Day: 0.5     Year Quit: 2000  Caffeine-Diet-Exercise     Caffeine use/day: 3     Does Patient Exercise: yes     Type of exercise: walks      Exercise (avg: min/session): 30-60     Times/week: 5  Problems Prior to Update: 1)  Lymphangitis  (ICD-457.2) 2)  Leg Cramps  (ICD-729.82) 3)  Dyspnea  (ICD-786.05) 4)  Obstructive Sleep Apnea  (ICD-327.23) 5)  Vitamin D Deficiency  (ICD-268.9) 6)  Degenerative Joint Disease, Knee  (ICD-715.96) 7)  Memory Loss  (ICD-780.93) 8)  Well Adult Exam  (ICD-V70.0) 9)  Hypoglycemia  (ICD-251.2) 10)  Pain in Joint, Multiple Sites  (ICD-719.49) 11)  Hip Pain, Right  (ICD-719.45) 12)  Muscle Weakness (GENERALIZED)  (ICD-728.87) 13)  Meningioma,  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) 14)  Colonic Polyps, Hx of  (ICD-V12.72) 15)  COPD Via X-ray  (ICD-496) 16)  Gastritis, Reflex Esophagitis, Duodenitis  (ICD-535.50) 17)  Panic Attack  (ICD-300.01) 18)  Fibrocystic Breast Disease  (ICD-610.1) 19)  Supraventricular Tachycardia, (HOLTER)  (ICD-427.89) 20)  Goiter, Multicystic  (ICD-240.9) 21)  Tobacco Abuse  (ICD-305.1) 22)  Sjogren's Syndrome  (ICD-710.2) 23)  Mitral Valve Prolapse, + Sbe Prophylaxis  (ICD-424.0) 24)  Sinusitis, Acute  (ICD-461.9) 25)  Vaginitis Nos  (ICD-616.10) 26)  Palpitations  (ICD-785.1) 27)  Hypothyroidism  (ICD-244.9) 28)  Hyperthyroidism  (ICD-242.90) 29)  Preoperative Examination  (ICD-V72.84)  Medications Prior to Update: 1)  Bystolic  2.5mg  .... Take 1 Tablet By Mouth Once A Day 2)  Prevacid 30 Mg  Cpdr (Lansoprazole) .... Take One By Mouth Once A Day 3)  Zocor 10 Mg Tabs (Simvastatin) .... Take 1 Tablet By Mouth Once A Day 4)  Synthroid 75 Mcg  Tabs (Levothyroxine Sodium) .... Take 1 Tablet By Mouth Once A Day 5)  Nasonex 50 Mcg/act Susp (Mometasone Furoate) .... One Spray Each Nostril Once or Twice Daily 6)  Align  Caps (Probiotic Product) .... One Daily  Allergies: 1)  ! Sulfa 2)  ! Codeine 3)  ! * Nexium 4)  ! * Decongestants 5)  ! Propantheline Bromide (Propantheline Bromide) 6)  !  Demerol (Meperidine Hcl) 7)  ! Darvocet  Past History:  Past Medical History: Last updated: 08/13/2007 COPD Colonic polyps, hx of  Past Surgical History: Last updated: 10/28/2008 R foot surgery 04/25/07 Hysterectomy (ovaries intact)  EGD - gastritis-- 01/99/// 11/06 Colonoscopy negative-- 01/99/// polyps 11/06--polyps 09/29/08 DEXA - wn-- 02/13/02 MRI L/S basically-- wnl MRI pelvis, right hip ? lesion  Thyroidectomy, total--02/07 MRI brain- meningeal based mass--02/28/07---minimal increase in mass 08/30/08 EMG--egative--02/14/07 Cardiac cath-- negative --02/25/07 2 D Echo- borderline mitral  valve prolapse with MR--5/03 Holter negative-- 1995/// palpitations-- 04/08                                                                                                        MRI brain with and without contrast--09/01/07--[facility]--probalbl neg  Family History: Last updated: 08/19/2009 Family history - CAD, elevated BP, CVA, DM, lung cancer, COPD   emphysema: father allergies: daughter heart disease: father rheumatism: mother cancer: brother (lung), mother (colon), father (leukemia), maternal grandfather (stomach), Maunt--melanoma   Social History: Last updated: 08/16/2008 Marital Status: Married Children: 2 Occupation: Journalist, newspaper, Clinical cytogeneticist 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   Risk Factors: Alcohol Use: 0 (08/19/2009) Caffeine Use: 3 (08/19/2009) Exercise: yes (08/19/2009)  Risk Factors: Smoking Status: quit (08/19/2009) Packs/Day: 0.5 (08/19/2009) Passive Smoke Exposure: no (08/06/2008)  Family History: Family history - CAD, elevated BP, CVA, DM, lung cancer, COPD   emphysema: father allergies: daughter heart disease: father rheumatism: mother cancer: brother (lung), mother (colon), father (leukemia), maternal grandfather (stomach), Maunt--melanoma   Social History: Reviewed history from 08/16/2008 and no changes required. Marital Status: Married Children: 2 Occupation: Journalist, newspaper, Clinical cytogeneticist 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  Packs/Day:  0.5  Review of Systems Eyes:  eye doc --<65yr, boarderline glaucoma--62mo, no cats. CV:  Complains of palpitations and swelling of feet; denies chest pain or discomfort and swelling of hands; wears support hose in cooler weather. Resp:  Denies cough, shortness of breath, and wheezing. GI:  Complains of constipation and vomiting blood; denies change in bowel habits, diarrhea, nausea, and vomiting; uses dulcolax tab every other day--woeks hx of colon polyps--last colonoscopy--09/29/08. MS:  Denies joint pain and  muscle aches. Derm:  Denies lesion(s) and rash; full body check by Derm--has AK--to be removed next mo. Neuro:  Complains of memory loss and tremors; denies difficulty with concentration, falling down, headaches, seizures, and weakness; memory unchanged.  Physical Exam  General:  alert, well-developed, well-nourished, and well-hydrated.   Head:  normocephalic, atraumatic, and no abnormalities observed.   Eyes:  vision grossly intact, pupils equal, pupils round, and no injection.  glasses Ears:  R ear impacted with cerumen L ear canal clear, TM clear Mouth:  pharynx pink and moist and no exudates.   Neck:  thyroid surgically absent, scar well healed no JVD and no carotid bruits.   Lungs:  normal respiratory effort, no intercostal retractions, no accessory muscle use, and normal breath sounds.   Heart:  normal rate, regular rhythm, and no murmur.   Abdomen:  soft, non-tender, normal bowel sounds, no distention, no masses, no guarding, no abdominal  hernia, no inguinal hernia, no hepatomegaly, and no splenomegaly.   Genitalia:  deferred to GYN Msk:  no joint swelling, no joint warmth, and no redness over joints.   Extremities:  no edema either lower leg at 9:30am  Neurologic:  alert & oriented X3, strength normal in all extremities, sensation intact to light touch, and gait normal.   Skin:  turgor normal, color normal, and no rashes.   Inguinal Nodes:  no R inguinal adenopathy and no L inguinal adenopathy.   Psych:  normally interactive, flat affect, and subdued.     Impression & Recommendations:  Problem # 1:  GOITER, MULTICYSTIC (ICD-240.9) Assessment Unchanged thyroidectomy stable on current dose of synthroid--sees endocrinologist  Problem # 2:  CERUMEN IMPACTION, LEFT (ICD-380.4) Assessment: New  removed wit irrigation with irrigation instrument  Orders: Cerumen Impaction Removal (06237)  Problem # 3:  OSTEOPENIA (ICD-733.90) Assessment: Comment Only DEXA ordered by Dr  Talmage Nap, rheumatologist has known vit D def--is taking high dose Vit D suppliment  Problem # 4:  GASTRITIS, REFLEX ESOPHAGITIS, DUODENITIS (ICD-535.50) Assessment: Unchanged cuurrently stable on zantac 150 as needed and daily prevacid15 mg The following medications were removed from the medication list:    Prevacid 30 Mg Cpdr (Lansoprazole) .Marland Kitchen... Take one by mouth once a day Her updated medication list for this problem includes:    Prevacid 15 Mg Cpdr (Lansoprazole) .Marland Kitchen... Take one to two tabs daily    Zantac 150 Mg Tabs (Ranitidine hcl) ..... Otc as directed.  Problem # 5:  HYPERLIPIDEMIA (ICD-272.4) stable on current dose of zocor 20 mg --taking daily labs done at [facility] The following medications were removed from the medication list:    Zocor 10 Mg Tabs (Simvastatin) .Marland Kitchen... Take 1 tablet by mouth once a day Her updated medication list for this problem includes:    Zocor 20 Mg Tabs (Simvastatin) .Marland Kitchen... Take one tablet daily  Complete Medication List: 1)  Bystolic 2.5mg   .... Take 1 tablet by mouth once a day 2)  Synthroid 75 Mcg Tabs (Levothyroxine sodium) .... Take 1 tablet by mouth once a day 3)  Nasonex 50 Mcg/act Susp (Mometasone furoate) .... One spray each nostril once or twice daily 4)  Align Caps (Probiotic product) .... One daily 5)  Prevacid 15 Mg Cpdr (Lansoprazole) .... Take one to two tabs daily 6)  Zocor 20 Mg Tabs (Simvastatin) .... Take one tablet daily 7)  Zantac 150 Mg Tabs (Ranitidine hcl) .... Otc as directed. 8)  Multivitamins Tabs (Multiple vitamin) .... One tab twice a day 9)  Vitamin D 50,000 Units  .... One every other week  Current Allergies (reviewed today): ! SULFA ! CODEINE ! * NEXIUM ! * DECONGESTANTS ! PROPANTHELINE BROMIDE (PROPANTHELINE BROMIDE) ! DEMEROL (MEPERIDINE HCL) ! DARVOCET   Immunization History:  Influenza Immunization History:    Influenza:  historical (06/22/2009)

## 2010-11-14 NOTE — Progress Notes (Signed)
Summary: paxil cr needs to be changed to generic  Phone Note Call from Patient Call back at Home Phone [phone]   Caller: Patient Summary of Call: Pt called  says her insurance says her Paxil Cr will be outrageous if she doesnt get the generic, so pharmacy will be calling for the generic paxil   any questons can call at home and tomorrow she will be at [facility] at work [phone] Initial call taken by: Kandice Hams,  March 22, 2008 3:14 PM  Follow-up for Phone Call        noted   Billie-Lynn Tyler Deis FNP  March 22, 2008 5:01 PM

## 2010-11-14 NOTE — Progress Notes (Signed)
  Phone Note Call from Patient Call back at [phone]   Caller: Patient Call For: Dr.Bryah O[place]ltree Summary of Call: This is a patient of Billie's.  She called to set up a cpx w/ you in November.  She said Billie recommended you and her daughter,Jessica Sneed,was able to transfer to you from [place].  She asked if you would consider seeing her.  Please advise. Initial call taken by: Beau Fanny,  March 28, 2010 3:50 PM  Follow-up for Phone Call        yes that is fine. Ruthe Mannan MD  March 29, 2010 7:38 AM   Additional Follow-up for Phone Call Additional follow up Details #1::        Pt. scheduled appt. for 03/24/10. Additional Follow-up by: Beau Fanny,  March 29, 2010 10:07 AM

## 2010-11-14 NOTE — Progress Notes (Signed)
Summary: RESULTS  Phone Note Call from Patient Call back at [phone]   Summary of Call: Patient would like results from event monitor.  She goes on break today at 3:30 and can take a phone call, please call her cell phone number [phone] Initial call taken by: [place],  April 12, 2010 12:57 PM  Follow-up for Phone Call        04/06/10 Holter/event monitor results scanned into EMR show NSR with occ PVC's per Dr Mariah Milling.  Will attempt to notify pt at 3:30 when pt is able to take phone calls. Follow-up by: Cloyde Reams RN,  April 12, 2010 3:03 PM  Additional Follow-up for Phone Call Additional follow up Details #1::        Called spoke with pt advised of results.  Pt states Dr Mariah Milling was going to review results of holter and advise pt whether or not to incr Bystolic to 5mg  daily or stay at 2.5mg .  Pt states when she has the fluttering feeling sometimes she gets lightheaded.  Results reflect NSR when pt reports fluttering feeling, pt wants to know what else this might be.  Additional Follow-up by: Cloyde Reams RN,  April 12, 2010 3:44 PM    Additional Follow-up for Phone Call Additional follow up Details #2::    She could try bystolic 5 mg daily. Not seeing fluttering. Occassional PVC which may feel like fluttering. Benign rhythm.   Appended Document: RESULTS    Clinical Lists Changes  Medications: Changed medication from BYSTOLIC 2.5 MG TABS (NEBIVOLOL HCL) Take 1 by mouth once daily to BYSTOLIC 5 MG TABS (NEBIVOLOL HCL) Take 1 tablet by mouth once a day

## 2010-11-14 NOTE — Procedures (Signed)
[facility]: [facility]   Imported By: Harlon Flor 03/24/2010 11:46:09  _____________________________________________________________________  External Attachment:    Type:   Image     Comment:   External Document

## 2010-11-14 NOTE — Consult Note (Signed)
Summary: [facility]/Consultation Report/Dr. Claudette [facility]/Consultation Report/Dr. [place] Callas   Imported By: Mickle Asper 10/17/2007 14:58:46  _____________________________________________________________________  External Attachment:    Type:   Image     Comment:   External Document

## 2010-11-14 NOTE — Consult Note (Signed)
Summary: Consultation Report  Consultation Report   Imported By: Beau Fanny 04/09/2007 09:53:27  _____________________________________________________________________  External Attachment:    Type:   Image     Comment:   External Document

## 2010-11-14 NOTE — Letter (Signed)
Summary: [facility]  [facility]   Imported By: Lanelle Bal 06/07/2009 09:32:26  _____________________________________________________________________  External Attachment:    Type:   Image     Comment:   External Document

## 2010-11-14 NOTE — Consult Note (Signed)
Summary: Dr. Mcarthur Rossetti Office Note  Dr. Mcarthur Rossetti Office Note   Imported By: Beau Fanny 08/04/2007 11:29:44  _____________________________________________________________________  External Attachment:    Type:   Image     Comment:   External Document

## 2010-11-14 NOTE — Progress Notes (Signed)
Summary: EVENT MONITOR  Phone Note Call from Patient Call back at [phone]   Caller: SELF Call For: [facility] Summary of Call: PT SPOKE WITH [facility] ABOUT GETTING A 30 DAY EVENT MONITOR-WOULD LIKE FOR AN ORDER TO BE SENT TO THEM WITH A DX CODE-THE LADY THAT SHE SPOKE WITH IS Consuella Lose #[phone] Initial call taken by: Harlon Flor,  Feb 24, 2010 2:22 PM  Follow-up for Phone Call        Called spoke with pt.  Pt is an employee at [employer], and she just needs a faxed rx with dx sent to [facility] and they will file the insurance and cover the monitor even if insurance doesn't cover.  No pre-cert required.  Order faxed.  Pt aware.   Follow-up by: Cloyde Reams RN,  Feb 24, 2010 2:52 PM

## 2010-11-14 NOTE — Assessment & Plan Note (Signed)
Summary: acute/discuss going back on previous meds/sore and stiff/cmt   Vital Signs:  Patient Profile:   60 Years Old Female Height:     68 inches Weight:      178 pounds Temp:     98.0 degrees F oral Pulse rate:   79 / minute BP sitting:   134 / 84  (right arm) Cuff size:   regular  Vitals Entered By: Cooper Render (March 22, 2008 8:53 AM)                 Chief Complaint:  discuss going back on paxil, ck thyroid, and stays stiff & sore.  History of Present Illness: Here to discuss need for antidepressant--off Paxil x 1 yr.--feels tired and anxious.  She called her cardiologist re side effect of Bystoic --told can cause depression.  Agreed that antidepressant may be a good thing.  Wants thyroid checked  Increase stress at work and at home.    Is stiff and sore--onset x few months.  Hard to get out of the car, feet sore.      Current Allergies (reviewed today): ! SULFA ! CODEINE ! * NEXIUM ! * DECONGESTANTS ! PROPANTHELINE BROMIDE (PROPANTHELINE BROMIDE)  Past Medical History:    Reviewed history from 08/13/2007 and no changes required:       COPD       Colonic polyps, hx of     Review of Systems  General      Complains of fatigue and malaise.  CV      Complains of swelling of feet.      Denies chest pain or discomfort, palpitations, and swelling of hands.  Resp      Denies chest pain with inspiration, cough, shortness of breath, and wheezing.  MS      see HPI  Psych      Complains of depression.   Physical Exam  General:     alert, well-developed, well-nourished, and well-hydrated.  up 5 lbs in past 1 yr Eyes:     pupils equal, pupils round, and no injection.  glasses Lungs:     normal respiratory effort, no intercostal retractions, no accessory muscle use, and normal breath sounds.   Heart:     normal rate, regular rhythm, and no murmur.   Msk:     no joint swelling, no joint warmth, and no redness over joints.  minimal deformity in finger  joints Neurologic:     alert & oriented X3.  slow to rise from seated, gait nl after several steps Psych:     normally interactive, good eye contact, flat affect, and subdued.      Impression & Recommendations:  Problem # 1:  PANIC ATTACK (ICD-300.01) Assessment: Deteriorated due to increased stress, will start back on Paxil--discussed generic vs branded--wants branded see back in 1 mo for follow up discussed fact that she may need to stay on for more than 1 yr as has been off and on several times Her updated medication list for this problem includes:    Paxil Cr 12.5 Mg Tb24 (Paroxetine hcl) .Marland Kitchen... 1 once daily   Problem # 2:  SJOGREN'S SYNDROME (ICD-710.2) Assessment: Comment Only Has seen Rheumatoogy in the past, was told would probalbly have periods of fatigue and joint pain may need referral if antidepressant not helpful and if joint pain continues Orders: TLB-TSH (Thyroid Stimulating Hormone) (84443-TSH)   Problem # 3:  PAIN IN JOINT, MULTIPLE SITES (ICD-719.49) Assessment: Deteriorated having more pain in  multiple joints--will get labs encouraged tylenol on regular basis Orders: Venipuncture (30865) TLB-Rheumatoid Factor (RA) (86430-RA) TLB-Sedimentation Rate (ESR) (85651-ESR) TLB-BMP (Basic Metabolic Panel-BMET) (80048-METABOL) TLB-CBC Platelet - w/Differential (85025-CBCD)   Complete Medication List: 1)  Singulair 10 Mg Tabs (Montelukast sodium) .... Take 1 tablet by mouth once a day 2)  Bystolic 2.5mg   .... Take 1 tablet by mouth once a day 3)  Aspirin 81 Mg Tbec (Aspirin) .... Take 1 tablet by mouth once a day 4)  Prevacid 30 Mg Cpdr (Lansoprazole) .... Take one by mouth once a day 5)  Zocor 20 Mg Tabs (Simvastatin) .... Take 1 tablet by mouth once a day 6)  Synthroid 75 Mcg Tabs (Levothyroxine sodium) .... Take 1 tablet by mouth once a day 7)  Paxil Cr 12.5 Mg Tb24 (Paroxetine hcl) .Marland Kitchen.. 1 once daily   Patient Instructions: 1)  Please schedule a follow-up  appointment in 1 month.   Prescriptions: PAXIL CR 12.5 MG  TB24 (PAROXETINE HCL) 1 once daily Brand medically necessary #90 x 3   Entered and Authorized by:   Gildardo Griffes FNP   Signed by:   Gildardo Griffes FNP on 03/22/2008   Method used:   Print then Give to Patient   RxID:   [id number]  ] Prior Medications (reviewed today): SINGULAIR 10 MG TABS (MONTELUKAST SODIUM) Take 1 tablet by mouth once a day BYSTOLIC  2.5MG  () Take 1 tablet by mouth once a day ASPIRIN 81 MG TBEC (ASPIRIN) Take 1 tablet by mouth once a day PREVACID 30 MG  CPDR (LANSOPRAZOLE) Take one by mouth once a day ZOCOR 20 MG  TABS (SIMVASTATIN) Take 1 tablet by mouth once a day SYNTHROID 75 MCG  TABS (LEVOTHYROXINE SODIUM) Take 1 tablet by mouth once a day Current Allergies (reviewed today): ! SULFA ! CODEINE ! * NEXIUM ! * DECONGESTANTS ! PROPANTHELINE BROMIDE (PROPANTHELINE BROMIDE)

## 2010-11-14 NOTE — Progress Notes (Signed)
Summary: EVENT MONITOR  Phone Note Call from Patient Call back at [phone]   Caller: SELF Call For: [facility] Summary of Call: PT HAS BEEN WEARING AN EVENT MONITOR-WAS IN A REMOTE AREA FOR AWHILE AND COULD NOT TRANSMIT-HAS BEEN TOLD BY [facility] THAT SHE CAN HAVE AN EXTENSION ON WEARING IT Initial call taken by: Harlon Flor,  March 22, 2010 3:19 PM  Follow-up for Phone Call        Talked to pt she was camping for 1 week were she did not get to wear event monitor due to being in remote location, order sent to [facility] to wear event monitor for 1 more week. Follow-up by: Benedict Needy, RN,  March 22, 2010 3:52 PM

## 2010-11-14 NOTE — Progress Notes (Signed)
Summary: written order for cbc and lab results mailed  Phone Note Outgoing Call Call back at [phone] pt's cell   Call placed by: Lewanda Rife Call placed to: Patient Summary of Call: I called pt with lab results and pt wants copy of lab results mailed to her at her home address along with a written order for the CBC to be done at [facility]. Pt request call back at [phone] or pt's cell [phone] to verify this can be done. Please advise.  Initial call taken by: Lewanda Rife LPN,  July 05, 2009 4:02 PM  Follow-up for Phone Call        order completed  Billie-Lynn Tyler Deis FNP  July 05, 2009 5:14 PM   Patient notified as instructed by telephone. Confirmed pt's address to mail info to. Letter mailed to patient as instructed.  Follow-up by: Lewanda Rife LPN,  July 06, 2009 9:40 AM

## 2010-11-14 NOTE — Assessment & Plan Note (Signed)
Summary: HAS NEW PT APPT COMING UP BUT NEEDS SOONER FOR VIRUS AND COUG...   Vital Signs:  Patient profile:   60 year old female Height:      67 inches Weight:      151.50 pounds BMI:     23.81 Temp:     98.6 degrees F oral Pulse rate:   80 / minute Pulse rhythm:   regular BP sitting:   110 / 80  (left arm) Cuff size:   regular  Vitals Entered By: Linde Gillis CMA Duncan Dull) (June 13, 2010 7:56 AM) CC: anxiety   History of Present Illness: 60 yo here to discuss anxiety.  Anxiety- has had panic attacks for years, used to be on Paxil. Now anxiety is much improved but under a great deal of stress at work and planning her son's wedding. Notices that certain situations make her very anxious and trigger a panic attack, a few times per month. Denies any symptoms of depression. No SI or HI. Does not want to take something daily.  Current Medications (verified): 1)  Bystolic 5 Mg Tabs (Nebivolol Hcl) .... Take 1 Tablet By Mouth Once A Day 2)  Synthroid 88 Mcg Tabs (Levothyroxine Sodium) .... Take 1 By Mouth Once Daily 3)  Nasonex 50 Mcg/act Susp (Mometasone Furoate) .... One Spray Each Nostril Once or Twice Daily 4)  Prevacid 15 Mg Cpdr (Lansoprazole) .... Take One To Two Tabs Daily 5)  Zocor 20 Mg Tabs (Simvastatin) .... Take One Tablet Daily 6)  Zantac 150 Mg Tabs (Ranitidine Hcl) .... Otc As Directed. 7)  Multivitamins   Tabs (Multiple Vitamin) .... One Tab Twice A Day 8)  Align  Caps (Probiotic Product) .... Take 1 By Mouth Once Daily 9)  Valium 5 Mg Tabs (Diazepam) .Marland Kitchen.. 1 Tab By Mouth Two Times A Day As Needed Anxiety  Allergies: 1)  ! Sulfa 2)  ! Codeine 3)  ! * Nexium 4)  ! * Decongestants 5)  ! Propantheline Bromide (Propantheline Bromide) 6)  ! Demerol (Meperidine Hcl) 7)  ! Darvocet  Past History:  Past Medical History: Last updated: 08/13/2007 COPD Colonic polyps, hx of  Past Surgical History: Last updated: 10/28/2008 R foot surgery 04/25/07 Hysterectomy  (ovaries intact)  EGD - gastritis-- 01/99/// 11/06 Colonoscopy negative-- 01/99/// polyps 11/06--polyps 09/29/08 DEXA - wn-- 02/13/02 MRI L/S basically-- wnl MRI pelvis, right hip ? lesion  Thyroidectomy, total--02/07 MRI brain- meningeal based mass--02/28/07---minimal increase in mass 08/30/08 EMG--egative--02/14/07 Cardiac cath-- negative --02/25/07 2 D Echo- borderline mitral valve prolapse with MR--5/03 Holter negative-- 1995/// palpitations-- 04/08                                                                                                        MRI brain with and without contrast--09/01/07--[facility]--probalbl neg  Family History: Last updated: 02/07/2010 Family history - CAD, elevated BP, CVA, DM, lung cancer, COPD, palpitations  emphysema: father allergies: daughter heart disease: father rheumatism: mother cancer: brother (lung), mother (colon), father (leukemia), maternal grandfather (stomach), Maunt--melanoma   Social History: Last updated: 08/16/2008 Marital Status: Married Children:  2 Occupation: Journalist, newspaper, [employer] 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   Risk Factors: Alcohol Use: 0 (08/19/2009) Caffeine Use: 3 (08/19/2009) Exercise: yes (08/19/2009)  Risk Factors: Smoking Status: quit (08/19/2009) Packs/Day: 0.5 (08/19/2009) Passive Smoke Exposure: no (08/06/2008)  Review of Systems      See HPI Psych:  Complains of anxiety and panic attacks; denies depression, easily angered, easily tearful, irritability, mental problems, sense of great danger, suicidal thoughts/plans, thoughts of violence, unusual visions or sounds, and thoughts /plans of harming others.  Physical Exam  General:  well-appearing woman in no apparent distress, pleasant. Psych:  normally interactive, flat affect, and subdued.     Impression & Recommendations:  Problem # 1:  PANIC ATTACK (ICD-300.01) Assessment Unchanged Time spent with patient 25 minutes, more than 50% of  this time was spent counseling patient on anxiety. Discussed addictive potential of benzos but she would only want to use it a few times a month. Ok with using in this situation as they are short acting and very effective with panic attacks. Pt in agreement with plan.  Her updated medication list for this problem includes:    Valium 5 Mg Tabs (Diazepam) .Marland Kitchen... 1 tab by mouth two times a day as needed anxiety  Complete Medication List: 1)  Bystolic 5 Mg Tabs (Nebivolol hcl) .... Take 1 tablet by mouth once a day 2)  Synthroid 88 Mcg Tabs (Levothyroxine sodium) .... Take 1 by mouth once daily 3)  Nasonex 50 Mcg/act Susp (Mometasone furoate) .... One spray each nostril once or twice daily 4)  Prevacid 15 Mg Cpdr (Lansoprazole) .... Take one to two tabs daily 5)  Zocor 20 Mg Tabs (Simvastatin) .... Take one tablet daily 6)  Zantac 150 Mg Tabs (Ranitidine hcl) .... Otc as directed. 7)  Multivitamins Tabs (Multiple vitamin) .... One tab twice a day 8)  Align Caps (Probiotic product) .... Take 1 by mouth once daily 9)  Valium 5 Mg Tabs (Diazepam) .Marland Kitchen.. 1 tab by mouth two times a day as needed anxiety Prescriptions: VALIUM 5 MG TABS (DIAZEPAM) 1 tab by mouth two times a day as needed anxiety  #60 x 0   Entered and Authorized by:   Ruthe Mannan MD   Signed by:   Ruthe Mannan MD on 06/13/2010   Method used:   Print then Give to Patient   RxID:   [id number]   Current Allergies (reviewed today): ! SULFA ! CODEINE ! * NEXIUM ! * DECONGESTANTS ! PROPANTHELINE BROMIDE (PROPANTHELINE BROMIDE) ! DEMEROL (MEPERIDINE HCL) ! DARVOCET  Flex Sig Next Due:  Not Indicated Last Hemoccult Result: Negative (10/30/2003 10:15:21 AM) Hemoccult Next Due:  Not Indicated   Appended Document: HAS NEW PT APPT COMING UP BUT NEEDS SOONER FOR VIRUS AND COUG...     Allergies: 1)  ! Sulfa 2)  ! Codeine 3)  ! * Nexium 4)  ! * Decongestants 5)  ! Propantheline Bromide (Propantheline Bromide) 6)  ! Demerol  (Meperidine Hcl) 7)  ! Darvocet   Complete Medication List: 1)  Bystolic 5 Mg Tabs (Nebivolol hcl) .... Take 1 tablet by mouth once a day 2)  Synthroid 88 Mcg Tabs (Levothyroxine sodium) .... Take 1 by mouth once daily 3)  Nasonex 50 Mcg/act Susp (Mometasone furoate) .... One spray each nostril once or twice daily 4)  Prevacid 15 Mg Cpdr (Lansoprazole) .... Take one to two tabs daily 5)  Zocor 20 Mg Tabs (Simvastatin) .... Take one tablet daily 6)  Zantac 150 Mg Tabs (  Ranitidine hcl) .... Otc as directed. 7)  Multivitamins Tabs (Multiple vitamin) .... One tab twice a day 8)  Align Caps (Probiotic product) .... Take 1 by mouth once daily 9)  Valium 5 Mg Tabs (Diazepam) .Marland Kitchen.. 1 tab by mouth two times a day as needed anxiety  Other Orders: Admin 1st Vaccine ([address]) Flu Vaccine 29yrs + ([address]) Zoster (Shingles) Vaccine Live (09811) Admin of Any Addtl Vaccine (91478) Flu Vaccine Consent Questions     Do you have a history of severe allergic reactions to this vaccine? no    Any prior history of allergic reactions to egg and/or gelatin? no    Do you have a sensitivity to the preservative Thimersol? no    Do you have a past history of Guillan-Barre Syndrome? no    Do you currently have an acute febrile illness? no    Have you ever had a severe reaction to latex? no    Vaccine information given and explained to patient? yes    Are you currently pregnant? no    Lot Number:[id number]   Exp Date:04/14/2011   Site Given  Right Deltoid IM  Orders: Admin 1st Vaccine (29562) Flu Vaccine 55yrs + ([address])      Immunizations Administered:  Zostavax # 1:    Vaccine Type: Zostavax    Site: left deltoid    Mfr: Merck    Dose: 0.70ml    Route: [place]    Given by: Linde Gillis CMA (AAMA)    Exp. Date: 05/10/2011    Lot #: [id number]    VIS given: 07/27/05 given June 13, 2010.

## 2010-11-14 NOTE — Consult Note (Signed)
Summary: Consultation Report  Consultation Report   Imported By: Beau Fanny 04/08/2007 11:16:55  _____________________________________________________________________  External Attachment:    Type:   Image     Comment:   External Document

## 2010-11-14 NOTE — Progress Notes (Signed)
Summary: reminder  Phone Note Call from Patient Call back at Work Phone [phone]   Caller: Patient Call For: Shamia Uppal Summary of Call: pt seen for a pre op cpx last week, pt says you told her to call you to remind you to send cpx paperwork to dr troxler surgeon Initial call taken by: Liane Comber,  April 01, 2007 1:07 PM  Follow-up for Phone Call        noted

## 2010-11-14 NOTE — Progress Notes (Signed)
Summary: cpap  Phone Note Call from Patient Call back at Work Phone [phone] Call back at try work phone first then cell:[phone]   Caller: Patient Call For: Meghan Mack Reason for Call: Talk to Nurse, Talk to Doctor Summary of Call: patient having problems with cpap machine and has some questions for the nurse Initial call taken by: Rickard Patience,  September 01, 2008 12:39 PM  Follow-up for Phone Call        Pt states she has been using the nasal pillows for 2 weeks now and she is not sleeping as well. She says her nose feels like it is on fire. She had tried a full face mask previously and says this caused irritation of her face. Please advise. [name] [facility]  September 01, 2008 2:03 PM  Additional Follow-up for Phone Call Additional follow up Details #1::        we can switch her to nasal cpap mask will send order to dme for this.  she needs to keep 4 week f/u with me.  this should be first week of december. Additional Follow-up by: Barbaraann Share MD,  September 01, 2008 4:45 PM    Additional Follow-up for Phone Call Additional follow up Details #2::    LMTCB. [name] [facility]  September 01, 2008 4:52 PM   called pt at her work number....she is aware that the order for the nasal mask has been sent to her DME.  she also requested that rhonda call her, she has some questions for her from the last time they talked.  i told pt that i would send rhonda a flag and let her know to call pt.   Marijo File CMA  September 02, 2008 11:23 AM

## 2010-11-14 NOTE — Assessment & Plan Note (Signed)
Summary: 30 MIN MEMORY TESTING/DLO   Vital Signs:  Patient Profile:   60 Years Old Female Height:     66.75 inches Weight:      168.04 pounds Temp:     97.6 degrees F oral Pulse rate:   88 / minute BP sitting:   120 / 90  (left arm)  Pt. in pain?   no  Vitals Entered By: Jeremy Johann CMA (August 19, 2008 8:31 AM)                  Referred by:  Everrett Coombe  PCP:  Everrett Coombe  Chief Complaint:  memory test and clean right ear .  History of Present Illness: Here for memory testing --hasindicated concern at last office visit that she is forgetful of names, has not gottern lost in car or burned food on stove.  Husband pays bills.  Does not forget appointments --has had excellent evals at [facility], no mention by cowowwrkers of things forgotten --no family member, except daughter has said anything about her memory --does not keep a calander for events, birthdays, etc  No family hx of Alzheimer's.    Current Allergies (reviewed today): ! SULFA ! CODEINE ! * NEXIUM ! * DECONGESTANTS ! PROPANTHELINE BROMIDE (PROPANTHELINE BROMIDE)  Past Medical History:    Reviewed history from 08/13/2007 and no changes required:       COPD       Colonic polyps, hx of   Family History:    Reviewed history from 08/16/2008 and no changes required:       Family history - CAD, elevated BP, CVA, DM, lung cancer, COPD                     emphysema: father       allergies: daughter       heart disease: father       rheumatism: mother       cancer: brother (lung), mother (colon), father (leukemia), maternal grandfather (stomach)   Social History:    Reviewed history from 08/16/2008 and no changes required:       Marital Status: Married       Children: 2       Occupation: Journalist, newspaper, Clinical cytogeneticist       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     Review of Systems      See HPI   Physical Exam  General:     alert, well-developed, well-nourished, and well-hydrated.    Neurologic:     --scored 29/30 on Folstein--missed date, changed and missed last number in serial 7's but was able to spell word "world" correctly backwards on first attempt --scored 4/4 on clock draw --named 17 animals in 1 min for animalgram    Impression & Recommendations:  Problem # 1:  MEMORY LOSS (ICD-780.93) Assessment: New scored high on all memory testing, if has memory loss is very early and meds not indicated at this time labs to evaluate treatable problems have been done and are all WNL--no treatable etiology found. cannot justify MRI of brain at this time--will follow discussed all this with her and the need to repeat testing in 1 yr for comparisome--agrees   in exam room  Problem # 2:  PANIC ATTACK (ICD-300.01) Assessment: New agrees to try Buspar for anxiety see back in 1 mo to monitor and titrate Her updated medication list for this problem includes:    Buspar 5 Mg Tabs (Buspirone hcl) .Marland KitchenMarland KitchenMarland KitchenMarland Kitchen  1/2 tab once daily x 1-2 wks, then increase to 1 once daily or as directed   Complete Medication List: 1)  Bystolic 2.5mg   .... Take 1 tablet by mouth once a day 2)  Prevacid 30 Mg Cpdr (Lansoprazole) .... Take one by mouth once a day 3)  Zocor 10 Mg Tabs (Simvastatin) .... Take 1 tablet by mouth once a day 4)  Synthroid 75 Mcg Tabs (Levothyroxine sodium) .... Take 1 tablet by mouth once a day 5)  Buspar 5 Mg Tabs (Buspirone hcl) .... 1/2 tab once daily x 1-2 wks, then increase to 1 once daily or as directed   Patient Instructions: 1)  Please schedule a follow-up appointment in 1 month.   Prescriptions: BUSPAR 5 MG TABS (BUSPIRONE HCL) 1/2 tab once daily x 1-2 wks, then increase to 1 once daily or as directed  #30 x 1   Entered and Authorized by:   Gildardo Griffes FNP   Signed by:   Gildardo Griffes FNP on 08/19/2008   Method used:   Print then Give to Patient   RxID:   [id number]  ]

## 2010-11-14 NOTE — Letter (Signed)
Summary: Generic Letter  [place] [facility]  [address] [place], [place] [address]   Phone: [phone]  Fax: [phone]    11/25/2008     [facility] [address] [place], [place]  [address]     Dear Ms. Gayman,    Enclosed is a copy of your lab results.     Sincerely,   [place] at [facility]

## 2010-11-14 NOTE — Progress Notes (Signed)
Summary: C PAP  Phone Note Call from Patient Call back at [phone]   Caller: Patient Call For: [facility] Summary of Call: HAVE QUESTIONS C PAP MACHINE CARD Initial call taken by: Rickard Patience,  August 18, 2008 4:57 PM  Follow-up for Phone Call        Spoke with pt and advised that per [facility] instructions she is to try cpap x 4 wks, then followup, but call sooner if had problems.  She thought that a "card" was supposed to be sent.  I advised to just use cpap as directed and keep fu, but call sooner as needed. Follow-up by: Vernie Murders,  August 18, 2008 5:11 PM

## 2010-11-14 NOTE — Progress Notes (Signed)
Summary: generic paxil cr 12.5  Phone Note Call from Patient   Caller: Patient Call For: Nigel Wessman Summary of Call: pt uses mail order pharmacy Next rx. rx given for paxil cr 12.5 # 90. med needs to be generic paxil cr12.5. pt is checking on status. Initial call taken by: Cooper Render,  March 26, 2008 10:40 AM  Follow-up for Phone Call        Rx to Karen's desk ---is signed on the left--this makes it generic--paxilCR   Billie-Lynn Tyler Deis FNP  March 29, 2008 1:23 PM   Additional Follow-up for Phone Call Additional follow up Details #1::        pt informed. rx mailed to pt. Additional Follow-up by: Cooper Render,  March 29, 2008 2:43 PM      Prescriptions: PAXIL CR 12.5 MG  TB24 (PAROXETINE HCL) 1 once daily Brand medically necessary #90 x 3   Entered and Authorized by:   Gildardo Griffes FNP   Signed by:   Gildardo Griffes FNP on 03/29/2008   Method used:   Print then Give to Patient   RxID:   [id number]

## 2010-11-14 NOTE — Assessment & Plan Note (Signed)
Summary: PRE OP CLEARANCE FOR FOOT SURG/CLE   Vital Signs:  Patient Profile:   60 Years Old Female Height:     68 inches Weight:      173 pounds Temp:     97.6 degrees F oral Pulse rate:   78 / minute BP sitting:   111 / 80  (right arm) Cuff size:   regular  Vitals Entered By: Cooper Render (March 18, 2007 11:35 AM)               Chief Complaint:  pre op clearance.  History of Present Illness: Here for clearance for R great toe surg 04/25/07, under local block, by Dr Recardo Evangelist, podiatrist.  Has had 2 previous surg on this foot.  This toe has had previous surg and has scar tissue build up causing the problem.    Now on Simvastatin 20mg .  LDL now 90.  Is seen by Dr Jenne Campus, cardio.  Had cardiac cath 02/25/07 neg per pt.  Dyastolic has been 90 or above, systolic is OK.  Palpitations improved on current med. (we have no records of procedures)  Sudden onset of dizziness, saw ENT.  Had MRI which revealed small mein-----(tumor).  To see Dr Jule Ser, neurosurg today.--(-we have no records)  Current Allergies: ! SULFA ! CODEINE ! * NEXIUM ! * DECONGESTANTS   Social History:    Marital Status: Married    Children: 2    Occupation: Journalist, newspaper, [employer]   Risk Factors:  Tobacco use:  quit Drug use:  no HIV high-risk behavior:  no   Review of Systems  General      Denies chills and fever.      wt stable x1+ yr  CV      Complains of palpitations.      Denies chest pain or discomfort.      see HPI  Resp      Denies shortness of breath.  GI      Denies change in bowel habits, nausea, and vomiting.  MS      Complains of joint pain.      Denies joint redness and joint swelling.      see HPI  Derm      Denies lesion(s) and rash.  Psych      Denies anxiety and depression.  Endo      see HPI   Physical Exam  General:     alert, well-developed, well-nourished, well-hydrated, and overweight-appearing.   Head:     normocephalic.   Eyes:  pupils equal, pupils round, and no injection.   Ears:     R ear normal and L ear normal.   Nose:     boggy with some edema Mouth:     good dentition and pharynx pink and moist.   Neck:     supple and no masses.  thyroid surgically absent Chest Wall:     no tenderness and no mass.   Lungs:     normal respiratory effort, normal breath sounds, no crackles, and no wheezes.   Heart:     normal rate, regular rhythm, and no murmur.   Abdomen:     soft, non-tender, normal bowel sounds, no distention, no masses, no inguinal hernia, no hepatomegaly, and no splenomegaly.   Msk:     normal ROM, no joint tenderness, no joint swelling, no joint warmth, and no muscle atrophy.   Pulses:     4+ pedals bilat, cannot papl post tibs bilat.  dilate capillaries both lower legs Extremities:     no edema lower legs bilat Neurologic:     alert & oriented X3, strength normal in all extremities, sensation intact to light touch, gait normal, and DTRs symmetrical and normal.   Skin:     turgor normal and color normal.   Cervical Nodes:     no anterior cervical adenopathy and no posterior cervical adenopathy.   Inguinal Nodes:     no R inguinal adenopathy and no L inguinal adenopathy.   Psych:     Oriented X3, normally interactive, not anxious appearing, and not depressed appearing.      Impression & Recommendations:  Problem # 1:  PREOPERATIVE EXAMINATION (ICD-V72.84) get needed records--ENT, Cardiologist will do CBC --notify of results will do clearance when records available  Problem # 2:  HYPOTHYROIDISM (ICD-244.9)  Her updated medication list for this problem includes:    Synthroid 88 Mcg Tabs (Levothyroxine sodium) .Marland Kitchen... Take 1 tablet by mouth once a day under care of Dr Talmage Nap, endocrin  Problem # 3:  PALPITATIONS (ICD-785.1) under care of Cardio.  Medications Added to Medication List This Visit: 1)  Synthroid 88 Mcg Tabs (Levothyroxine sodium) .... Take 1 tablet by mouth once a day 2)   Zocor 20 Mg Tabs (Simvastatin) .... .qdtab 3)  Singulair 10 Mg Tabs (Montelukast sodium) .... Take 1 tablet by mouth once a day 4)  Bystolic 2.5mg   .... Take 1 tablet by mouth once a day 5)  Aspirin 81 Mg Tbec (Aspirin) .... Take 1 tablet by mouth once a day   Patient Instructions: 1)  please get records from [facility] Heart:  last EKG, cardiac cath report, last office note 2)  please ger MRI report from Dr Willeen Cass, [facility], and office note--most recent 3)  Get labs done 03/13/07 at [facility] for Dr Talmage Nap

## 2010-11-14 NOTE — Progress Notes (Signed)
Summary: Refill simvastatin  Phone Note Refill Request   Refills Requested: Medication #1:  ZOCOR 20 MG TABS take one tablet daily   Dosage confirmed as above?Dosage Confirmed [facility]- 90 day supply  Initial call taken by: West Carbo,  June 08, 2010 10:37 AM    Prescriptions: ZOCOR 20 MG TABS (SIMVASTATIN) take one tablet daily  #90 x 3   Entered by:   Bishop Dublin, CMA   Authorized by:   Dossie Arbour MD   Signed by:   Bishop Dublin, CMA on 06/08/2010   Method used:   Faxed to ...       [facility] [phone] (retail)       [address] [address]       [place], [place]  [id number]       Ph: [phone]       Fax: [phone]   RxID:   [id number]

## 2010-11-14 NOTE — Progress Notes (Signed)
Summary: needs order for UA and culture  Phone Note Call from Patient Call back at [phone], [phone].   Caller: Patient Call For: Gildardo Griffes FNP Summary of Call: Pt was treated by her gyn for a UTI and was told to follow up with you to make sure it is gone.  She finished her cipro yesterday and didnt know how long she should wait before getting another ua and culture.  She works at [employer] so she will need an order faxed to them at [phone]. Initial call taken by: Lowella Petties CMA,  October 17, 2009 4:27 PM  Follow-up for Phone Call        she should wait 3-4 d after completion of ABT for culture she needs to call her GYN for the order for the culture  Billie-Lynn Tyler Deis FNP  October 18, 2009 7:43 AM   Patient notified as instructed by telephone.  Follow-up by: Lewanda Rife LPN,  October 18, 2009 9:39 AM

## 2010-11-14 NOTE — Procedures (Signed)
Summary: [facility]/Colonoscopy/Dr. Mechele Collin  [facility]/Colonoscopy/Dr. Mechele Collin   Imported By: Eleonore Chiquito 10/06/2008 11:19:47  _____________________________________________________________________  External Attachment:    Type:   Image     Comment:   External Document

## 2010-11-14 NOTE — Letter (Signed)
Summary: [facility]  [facility]   Imported By: Maryln Gottron 07/29/2009 15:19:53  _____________________________________________________________________  External Attachment:    Type:   Image     Comment:   External Document

## 2010-11-14 NOTE — Letter (Signed)
Summary: Results Follow up Letter  [place] at [facility]  [address] [place], [place] [address]   Phone: [phone]  Fax: [phone]    09/30/2007 MRN: [id number]  [facility] [address] [place], [place]  [address]  Dear Ms. [place]well,  The following are the results of your recent test(s):  Test         Result    Pap Smear:        Normal _____  Not Normal _____ Comments: ______________________________________________________ Cholesterol: LDL(Bad cholesterol):         Your goal is less than:         HDL (Good cholesterol):       Your goal is more than: Comments:  ______________________________________________________ Mammogram:        Normal __X___  Not Normal _____ Comments:  Repeat in 1 year please.  ___________________________________________________________________ Hemoccult:        Normal _____  Not normal _______ Comments:    _____________________________________________________________________ Other Tests:    We routinely do not discuss normal results over the telephone.  If you desire a copy of the results, or you have any questions about this information we can discuss them at your next office visit.   Sincerely,    Billie Bean,FNP/[place],CMA

## 2010-11-14 NOTE — Assessment & Plan Note (Signed)
Summary: ST,FEVER/CLE   Vital Signs:  Patient Profile:   60 Years Old Female Height:     68 inches Weight:      175.4 pounds Temp:     98.3 degrees F oral Pulse rate:   68 / minute Pulse rhythm:   regular BP sitting:   112 / 78  (left arm) Cuff size:   regular  Vitals Entered By: Sydell Axon (June 19, 2007 5:16 PM)                 Chief Complaint:  sinus drainage, fever, and sore neck.  History of Present Illness: Sinus problems for about 1 week Now with soreness in glands on right neck Has frontal pressure and pain Drainage down throat causing sore throat some cough--has been clear so far Low grade fever last night and this morning  No SOB No N/V/diarrhea Appetite okay No ear pain--just a little pressure  Tried claritin Is on singulair for allergies Advil has helped a little  Current Allergies (reviewed today): ! SULFA ! CODEINE ! * NEXIUM ! * DECONGESTANTS ! PROPANTHELINE BROMIDE (PROPANTHELINE BROMIDE)     Review of Systems      See HPI   Physical Exam  General:     NAD Head:     maxillary tenderness slight frontal sensitvity Ears:     R ear normal and L ear normal.   Nose:     mild inflammation Mouth:     no erythema and no exudates.   Neck:     supple.   Mildly tender right ant cervical nodes Lungs:     normal respiratory effort and normal breath sounds.      Impression & Recommendations:  Problem # 1:  SINUSITIS, ACUTE (ICD-461.9) Assessment: New Not clear that this is more than just viral but fever and tender node is concerning for secondary bacterial infection P:Rx for amoxil to fill if worsens    analgesics  The following medications were removed from the medication list:    Cipro 500 Mg Tabs (Ciprofloxacin hcl) .Marland Kitchen... 1 bid  Her updated medication list for this problem includes:    Amoxil 500 Mg Caps (Amoxicillin) .Marland Kitchen... 2 two times a day   Complete Medication List: 1)  Synthroid 88 Mcg Tabs (Levothyroxine  sodium) .... Take 1 tablet by mouth once a day 2)  Zocor 20 Mg Tabs (Simvastatin) .... .qdtab 3)  Singulair 10 Mg Tabs (Montelukast sodium) .... Take 1 tablet by mouth once a day 4)  Bystolic 2.5mg   .... Take 1 tablet by mouth once a day 5)  Aspirin 81 Mg Tbec (Aspirin) .... Take 1 tablet by mouth once a day 6)  Prevacid 30 Mg Cpdr (Lansoprazole) .... Take one by mouth once a day 7)  Amoxil 500 Mg Caps (Amoxicillin) .... 2 two times a day   Patient Instructions: 1)  Please schedule a follow-up appointment as needed.    Prescriptions: AMOXIL 500 MG  CAPS (AMOXICILLIN) 2 two times a day  #40 x 0   Entered and Authorized by:   Cindee Salt MD   Signed by:   Cindee Salt MD on 06/19/2007   Method used:   Print then Give to Patient   RxID:   [id number]   Current Allergies (reviewed today): ! SULFA ! CODEINE ! * NEXIUM ! * DECONGESTANTS ! PROPANTHELINE BROMIDE (PROPANTHELINE BROMIDE)

## 2010-11-14 NOTE — Cardiovascular Report (Signed)
Summary: Cardiac Cath Other  Cardiac Cath Other   Imported By: Frazier Butt Chriscoe 03/28/2010 10:59:17  _____________________________________________________________________  External Attachment:    Type:   Image     Comment:   External Document

## 2010-11-14 NOTE — Progress Notes (Signed)
Summary: Question about Labs  Phone Note Call from Patient Call back at Home Phone [phone]   Caller: Patient Call For: Dr. Mariah Milling Summary of Call: Patient called and stated she has appt. with Dr. Mariah Milling on May 13th.  Does she need to have lab work prior to appt and if so can we add a TSH onto this lab work? Initial call taken by: [place],  Feb 13, 2010 2:11 PM  Follow-up for Phone Call        Attempted TCB pt.  LMOM to call back.  Pt appears to have a new pt appt with Dr Mariah Milling on 02/24/10.  No bloodwork scheduled to be done prior to appt. Follow-up by: Cloyde Reams RN,  Feb 13, 2010 3:09 PM  Additional Follow-up for Phone Call Additional follow up Details #1::        Spoke with pt.  Pt is a former pt of Dr Windell Hummingbird at [facility] saw him in [place].  Has signed release of records.  Advised no prior labwork necessary before 02/24/10 OV.  Pt works at [employer] and will have labs drawn there if needed.   Additional Follow-up by: Cloyde Reams RN,  Feb 13, 2010 4:24 PM

## 2010-11-14 NOTE — Progress Notes (Signed)
Summary: local reaction to topicort  Phone Note Call from Patient Call back at [phone]   Caller: Patient Summary of Call: Pt was given zostavax on 8/30 and she says the area around the injection site is red and itchy- about the size of a 50 cent piece.  She is asking if ok to use topicort  to help with the itching, advised yes. Initial call taken by: Lowella Petties CMA,  June 15, 2010 8:26 AM  Follow-up for Phone Call        agree Follow-up by: Hannah Beat MD,  June 15, 2010 8:37 AM

## 2010-11-14 NOTE — Consult Note (Signed)
Summary: Dr. Jenne Campus Office Note  Dr. Jenne Campus Office Note   Imported By: Beau Fanny 06/23/2007 10:51:48  _____________________________________________________________________  External Attachment:    Type:   Image     Comment:   External Document

## 2010-11-14 NOTE — Letter (Signed)
Summary: Immunization/Shot Record  [place] [facility]  [address] [place], [place] [address]   Phone: [phone]  Fax: [phone]     Immunization Record for: Meghan Mack  Vaccine 1 2 3 4 5 6  HepB Hepatitis B                    DTP Diphtheria, Tetanus, Pertussis                         HIB Haemophilus influenzae Type b                 LKGMWNUUVO IPV Inactivated Poliovirus             MMR Measles, Mumps, Jeanella Craze ZDGUYQIHKV QQVZDGLOVF Varicella Varivax    IEPPIRJJOA CZYSAYTKZS WFUXNATFTD Pneumococcal           Hep A Hepatitis A   DUKGURKYHC WCBJSEGBTD VVOHYWVPXT GGYIRSWNIO        Tetanus Booster Date of Last: Td 07/17/2001  Flu Shot Date of Last: 06/13/2010 Pneumovax Date of Last: 08/11/2007 Meningococcal Vaccine Given:       Other Vaccines HPV Vaccine/ Date of Last:    Vaccine/ Date of Last:    Vaccine/ Date of Last:     [name]  EVOJJKKXFG  HWEXHBZJIR Rotavirus Vaccine/ Date of Last:    Vaccine/ Date of Last:    Vaccine/ Date of Last:     CVELFYBOFB  PZWCHENIDP  OEUMPNTIRW Zostavax Vaccine/ Date of Last: Zostavax 06/13/2010   [name]  ERXVQMGQQP  YPPJKDTOIZ  TIWPYKDXIP  JASNKNLZJQ  Recommended Childhood and Adolescent Immunization Schedule [place]  2006 Vaccine Age Birth 1 mos 2 mos 4 mos 6 mos 12 mos 15 mos 18 mos 24 mos 4-6 yrs 11-12 yrs 13-14 yrs 15 yrs 16-18 yrs Hepatitis B1 HepB HepB HepB1  HepB  HepB Series Catch-Up Diphtheria, Tetanus, Pertussis2   DTaP DTaP DTaP   DTaP  DTaP Tdap  Tdap Catch-Up Haemophilus influenzae type b3   Hib Hib Hib3 Hib        Inactivated Poliovirus   IPV IPV  IPV   IPV     Measles, Mumps, Rubella4      MMR   MMR M MR MMR Catch-Up Varicella5       Varicella  Varicella  Catch-Up Meningo-coccal6           MCV4  MCV4 CatchUpV4           MPSV4 for High Risk Groups  C MCV4 for High Risk Groups Pneumo-coccal7   PCV PCV PCV PCV   PCV  Catch-Up PPV for High Risk Groups         PPV for High Risk Groups  Influenza8      Influenza (Yearly)  Influenza (Yearly) for High Risk Groups Hepatitis A9       HepA Series  This schedule indicates the recommended ages for routine administration of currently licensed childhood vaccines, as of September 14, 2004, for children through age 73 years. Any dose not administered at the recommended age should be administered at any subsequent visit when indicated and feasible. Indicates age groups that warrant special effort to administer those vaccines not previously administered. Additional vaccines may be licensed and recommended during the year. Licensed combination vaccines may be used whenever any components of the combination are indicated and other components of the vaccine are not contraindicated and if approved by the  Food and Drug Administration for that dose of the series. Providers should consult the respective ACIP statement for detailed recommendations. Clinically significant adverse events that follow immunization should be reported to the Vaccine Adverse Event Reporting System (VAERS). Guidance about how to obtain and complete a VAERS form is available at www.vaers.LAgents.no or by telephone, ([phone].  The Childhood and Adolescent Immunization Schedule is approved by: Advisory Committee on Administrator http://www.wade.com/   American Academy of Pediatrics BridgeDigest.com.cy   American Academy [employer].aafp.org

## 2010-11-14 NOTE — Letter (Signed)
Summary: Painful Varicose Veins/[facility]  Painful Varicose Veins/[facility]   Imported By: Sherian Rein 06/09/2009 13:04:37  _____________________________________________________________________  External Attachment:    Type:   Image     Comment:   External Document

## 2010-11-14 NOTE — Progress Notes (Signed)
Summary: Need written order for CBC for [facility]  Phone Note Outgoing Call   Call placed by: Lewanda Rife Summary of Call: Pt was notified about lab results. Pt wants copy of labs and a written order for CBC in 2 months mailed to her. Please write the written order for CBC for [facility] and give to me and I will mail with copy of labs to the pt. Thank you. Initial call taken by: Lewanda Rife LPN,  August 11, 2009 2:12 PM  Follow-up for Phone Call        order done  Billie-Lynn Tyler Deis FNP  August 11, 2009 5:27 PM   Lab results and order for next lab work for [facility] to pt as instructed. Follow-up by: Lewanda Rife LPN,  August 12, 2009 8:27 AM

## 2010-11-14 NOTE — Progress Notes (Signed)
Summary: problem w/ bi pap   Phone Note Call from Patient Call back at [phone]   Caller: Spouse Call For: clance Summary of Call: pt having difficulty w/ bi pap. says card was read, but pt hasn't heard from anyone re: results. pt sleeps for a few hours then the pressure wakes her up. pt ph # [phone] Initial call taken by: Tivis Ringer,  October 06, 2008 10:54 AM  Follow-up for Phone Call        [facility] have you seen this download on pt  Meghan Mack [facility]  October 06, 2008 12:34 PM   Additional Follow-up for Phone Call Additional follow up Details #1::        I have not, but it is probably somewhere in the stacks of paperwork. Additional Follow-up by: Barbaraann Share MD,  October 06, 2008 7:10 PM    Additional Follow-up for Phone Call Additional follow up Details #2::    Results located and given to [facility] to review and address problems. Cloyde Reams RN  October 07, 2008 10:34 AM   Additional Follow-up for Phone Call Additional follow up Details #3:: Details for Additional Follow-up Action Taken: let pt know that her Geraldine Solar shows she only wore 7/14 days, and never greater than 4 hrs. Her optimal pressure during her limited time appears to be 12/8.  If she wants to continue trying bipap, I would set her on 12/8 and give her 4 weeks to see how things go.  Let me know if she is willing to do this.  Pt says she is only wearing bipap 4 hours because the pressure seems to change after 4 hours and is too much. She wanted to make sure you are aware of this before she decides to try bipap longer. Please advise. [name] CMA  October 07, 2008 1:40 PM   she was on auto bipap before which raises pressure in response to worsening episodes of sleep apnea during the night.  The sudden increase means she needed the pressure to treat a more severe event of sleep apnea.  I would like to try her on bipap with a FIXED pressure, then increase as needed at her tolerance allows.   lets try bipap at  12/8 for 4 weeks. will send an order to [facility].  let her know to use the ramp button if she needs to decrease the pressure some until she can fall asleep.  she can hit that button as much as she wants.  Needs ov with me in 4 weeks.  spoke with patient, stated that she will try the bipap at the setting suggested by Dr. Shelle Iron, but will wait to make her follow up for when she has a work schedule available.  Patient would like to know however, where the "four sudden bursts of pressure" come from 3-4 hours after initial start of the bipap.  Thanks! Boone Master CNA  October 11, 2008 8:58 AM   LMOMTCB Boone Master CNA  October 11, 2008 4:54 PM   [facility] Boone Master CNA  October 13, 2008 3:37 PM   Pt called back, please call her at number privided at beginning of message [phone].  Eugene Gavia  October 14, 2008 9:18 AM  Called spoke with pt.  Advised sudden bursts of pressure were only present because she was on the auto BiPAP and the incr pressure was in response to more severe episodes of sleep apnea.  Advised will not occur at fixed pressure.EWJ Additional Follow-up by: Mellody Dance  Harle Stanford MD,  October 07, 2008 1:29 PM

## 2010-11-14 NOTE — Assessment & Plan Note (Signed)
Summary: ?UTI   Vital Signs:  Patient Profile:   60 Years Old Female Height:     68 inches Weight:      175 pounds Temp:     97.9 degrees F oral Pulse rate:   79 / minute BP sitting:   112 / 80  (left arm) Cuff size:   regular  Vitals Entered By: Cooper Render (May 16, 2007 10:29 AM)               Chief Complaint:  ?uti.  History of Present Illness: Onset of UTI sx  ~1wk ago, with dysuria x 4-5 d.  Had been using Monistat due to vag discharge. Nocturia no more than usual.  Urgency and incontinence  Had foot surg 04/25/07--has been on 2 rounds of ABT.  Currently taking nothing, was on Keflex. To start PT today.  Developed vaginal discharge and itch with ABT usage.  Current Allergies (reviewed today): ! SULFA ! CODEINE ! * NEXIUM ! * DECONGESTANTS Updated/Current Medications (including changes made in today's visit):  SYNTHROID 88 MCG TABS (LEVOTHYROXINE SODIUM) Take 1 tablet by mouth once a day ZOCOR 20 MG TABS (SIMVASTATIN) .qdtab SINGULAIR 10 MG TABS (MONTELUKAST SODIUM) Take 1 tablet by mouth once a day * BYSTOLIC  2.5MG  Take 1 tablet by mouth once a day ASPIRIN 81 MG TBEC (ASPIRIN) Take 1 tablet by mouth once a day PROPANTHELINE BROMIDE   POWD (PROPANTHELINE BROMIDE) as directed CIPRO 500 MG  TABS (CIPROFLOXACIN HCL) 1 bid   Past Surgical History:    R foot surgery 04/25/07     Review of Systems      See HPI   Physical Exam  General:     alert, well-developed, well-nourished, and well-hydrated.   Head:     normocephalic.   Lungs:     normal respiratory effort.   Abdomen:     suprapubic discomfort with palp, no CVA tenderness Neurologic:     alert & oriented X3 and sensation intact to light touch.  moon boot on R foot--ambulating without cane Skin:     turgor normal and color normal.   Psych:     normally interactive.      Impression & Recommendations:  Problem # 1:  U T I (ICD-599.0) Assessment: New  Her updated medication list for this  problem includes:    Cipro 500 Mg Tabs (Ciprofloxacin hcl) .Marland Kitchen... 1 bid   Problem # 2:  VAGINITIS NOS (ICD-616.10)  Her updated medication list for this problem includes:    Cipro 500 Mg Tabs (Ciprofloxacin hcl) .Marland Kitchen... 1 two times a day  Will need to use Monostat post ABT, if occurs due to being on Simvastatin...understands use vinegar rinses after voiding and yogart with culture daily.  Complete Medication List: 1)  Synthroid 88 Mcg Tabs (Levothyroxine sodium) .... Take 1 tablet by mouth once a day 2)  Zocor 20 Mg Tabs (Simvastatin) .... .qdtab 3)  Singulair 10 Mg Tabs (Montelukast sodium) .... Take 1 tablet by mouth once a day 4)  Bystolic 2.5mg   .... Take 1 tablet by mouth once a day 5)  Aspirin 81 Mg Tbec (Aspirin) .... Take 1 tablet by mouth once a day 6)  Propantheline Bromide Powd (Propantheline bromide) .... As directed 7)  Cipro 500 Mg Tabs (Ciprofloxacin hcl) .Marland Kitchen.. 1 bid    Patient Instructions: 1)  reviewed plan with patient--agrees    Prescriptions: CIPRO 500 MG  TABS (CIPROFLOXACIN HCL) 1 bid  #14 x 0  Entered and Authorized by:   Gildardo Griffes FNP   Signed by:   Gildardo Griffes FNP on 05/16/2007   Method used:   Print then Give to Patient   RxID:   [id number]      Laboratory Results   Urine Tests  Date/Time Recieved: 05/16/2007 Date/Time Reported: 05/16/07  Routine Urinalysis   Glucose: negative   (Normal Range: Negative) Bilirubin: negative   (Normal Range: Negative) Ketone: negative   (Normal Range: Negative) Spec. Gravity: 1.020   (Normal Range: 1.003-1.035) Blood: small   (Normal Range: Negative) pH: 6.0   (Normal Range: 5.0-8.0) Protein: negative   (Normal Range: Negative) Urobilinogen: negative   (Normal Range: 0-1) Nitrite: negative   (Normal Range: Negative) Leukocyte Esterace: small   (Normal Range: Negative)  Urine Microscopic WBC/hpf: 10-20 RBC/hpf: 20-25 Bacteria: +--rods Epithelial: rare

## 2010-11-14 NOTE — Letter (Signed)
Summary: PHI  PHI   Imported By: Harlon Flor 02/27/2010 11:15:41  _____________________________________________________________________  External Attachment:    Type:   Image     Comment:   External Document

## 2010-11-14 NOTE — Assessment & Plan Note (Signed)
Summary: CHECK LYMPH NODES/CLE   Vital Signs:  Patient profile:   60 year old female Height:      67.25 inches Weight:      153 pounds BMI:     23.87 Temp:     98 degrees F oral Pulse rate:   64 / minute Pulse rhythm:   irregular BP sitting:   120 / 80  (left arm) Cuff size:   regular  Vitals Entered By: Lewanda Rife LPN (June 29, 2009 8:35 AM)  Primary Care Provider:  Everrett Coombe  CC:  ck lymph nodes in neck and heart flutters and weakness and shaky.  History of Present Illness:  Here for enlarged lymph--neck at visit with Dr Lisabeth Devoid 06/23/2009--not aware of lesion or irritation of scalp or head at time of enlargement.  no recent fever or URI signs.  Had episode of shakey legs, has flutters each  day at times but not with these episodes, head feels foggy--lasted  ~65min then resolved, x3 over past several mo --happened  ~29yr ago --sees Dr Lewie Loron, cardio at [facility] work up, no appt at present, 1xqyr     Preventive Screening-Counseling & Management  Caffeine-Diet-Exercise     Caffeine use/day: 3  Problems Prior to Update: 1)  Leg Cramps  (ICD-729.82) 2)  Dyspnea  (ICD-786.05) 3)  Obstructive Sleep Apnea  (ICD-327.23) 4)  Vitamin D Deficiency  (ICD-268.9) 5)  Degenerative Joint Disease, Knee  (ICD-715.96) 6)  Memory Loss  (ICD-780.93) 7)  Well Adult Exam  (ICD-V70.0) 8)  Hypoglycemia  (ICD-251.2) 9)  Pain in Joint, Multiple Sites  (ICD-719.49) 10)  Hip Pain, Right  (ICD-719.45) 11)  Muscle Weakness (GENERALIZED)  (ICD-728.87) 12)  Meningioma, 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90) 28)  Preoperative Examination  (ICD-V72.84)  Medications Prior to Update: 1)  Bystolic  2.5mg  .... Take 1 Tablet By Mouth Once A Day 2)  Prevacid 30 Mg  Cpdr (Lansoprazole) .... Take One By Mouth Once A Day 3)  Zocor 10 Mg Tabs (Simvastatin) .... Take 1 Tablet By Mouth Once A Day 4)  Synthroid 75 Mcg  Tabs (Levothyroxine Sodium) .... Take 1 Tablet By Mouth Once A Day 5)  Buspar 5 Mg Tabs (Buspirone Hcl) .... 1/2 Tab Once Daily X 1-2 Wks, Then Increase To 1 Once Daily or As Directed 6)  Soma 350 Mg Tabs (Carisoprodol) .Marland Kitchen.. 1 At Bedtime As Needed Leg Cramps  Allergies: 1)  ! Sulfa 2)  ! Codeine 3)  ! * Nexium 4)  ! * Decongestants 5)  ! Propantheline Bromide (Propantheline Bromide) 6)  ! Demerol (Meperidine Hcl)  Past History:  Past Medical History: Last updated: 08/13/2007 COPD Colonic polyps, hx of  Past Surgical History: Last updated: 10/28/2008 R foot surgery 04/25/07 Hysterectomy (ovaries intact)  EGD - gastritis-- 01/99/// 11/06 Colonoscopy negative-- 01/99/// polyps 11/06--polyps 09/29/08 DEXA - wn-- 02/13/02 MRI L/S basically-- wnl MRI pelvis, right hip ? lesion  Thyroidectomy, total--02/07 MRI brain- meningeal based mass--02/28/07---minimal increase in mass 08/30/08 EMG--egative--02/14/07 Cardiac cath-- negative --02/25/07 2 D Echo- borderline mitral valve prolapse with MR--5/03 Holter negative-- 1995/// palpitations-- 04/08                                                                                                        MRI brain with and without contrast--09/01/07--[place]--probalbl neg  Family History: Last updated: 08/16/2008 Family history - CAD, elevated BP, CVA, DM, lung cancer, COPD   emphysema: father allergies:  daughter heart disease: father rheumatism: mother cancer: brother (lung), mother (colon), father (leukemia), maternal grandfather (stomach)   Social History: Last updated: 08/16/2008 Marital Status: Married Children: 2 Occupation: Journalist, newspaper, Clinical cytogeneticist 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   Risk Factors: Caffeine Use: 3 (06/29/2009) Exercise: yes (08/06/2008)  Risk Factors: Smoking Status: quit (08/16/2008) Passive Smoke Exposure: no (08/06/2008)  Social History: Caffeine use/day:  3  Review of Systems CV:  Complains of chest pain or discomfort, fatigue, lightheadness, and palpitations; denies swelling of feet and swelling of hands. Resp:  Denies cough, shortness of breath, and wheezing. GI:  Denies nausea and vomiting. MS:  Complains of stiffness. Neuro:  Denies difficulty with concentration, disturbances in coordination, falling down, memory loss, numbness, and tremors. Psych:  Complains of anxiety and depression.  Physical Exam  General:  alert, well-developed, well-nourished, and well-hydrated.  7lb wt loss since last visit  ~33mo ago Eyes:  pupils equal, pupils round, and no injection.   Lungs:  normal respiratory effort, no intercostal retractions, no accessory muscle use, and normal breath sounds.   Heart:  normal rate, regular rhythm, and no murmur.  EKG: NSR Neurologic:  alert & oriented X3, sensation intact to light touch, and gait normal.   Cervical Nodes:  no anterior cervical adenopathy and no posterior cervical adenopathy.     Impression & Recommendations:  Problem # 1:  LYMPHANGITIS (ICD-457.2) Assessment New no nodes currently in neck--discussed fact that they increase with infection/irritation in area they drain and go "down" when not needed  will get CBC and bmet--tx or refer if elevated Orders: Venipuncture (95621) TLB-BMP (Basic Metabolic Panel-BMET) (80048-METABOL) TLB-CBC Platelet - w/Differential (85025-CBCD)  Problem # 2:  PALPITATIONS  (ICD-785.1) Assessment: New EKG now NSR--will make appt with cardio if this continues to be a problem--understands Orders: 12 Lead EKG (12 Lead EKG)  Complete Medication List: 1)  Bystolic 2.5mg   .... Take 1 tablet by mouth once a day 2)  Prevacid 30 Mg Cpdr (Lansoprazole) .... Take one by mouth once a day 3)  Zocor 10 Mg Tabs (Simvastatin) .... Take 1 tablet by mouth once a day 4)  Synthroid 75 Mcg Tabs (Levothyroxine sodium) .... Take 1 tablet by mouth once a day 5)  Nasonex 50 Mcg/act Susp (Mometasone furoate) .... One spray each nostril once or twice daily 6)  Align Caps (Probiotic product) .... One daily  Current Allergies (reviewed today): ! SULFA ! CODEINE ! * NEXIUM ! * DECONGESTANTS ! PROPANTHELINE BROMIDE (PROPANTHELINE BROMIDE) ! DEMEROL (MEPERIDINE HCL)

## 2010-11-14 NOTE — Assessment & Plan Note (Signed)
Summary: LEG CRAMPS//CYD   Vital Signs:  Patient Profile:   60 Years Old Female Height:     66.75 inches Weight:      160 pounds Temp:     98.3 degrees F oral Pulse rate:   84 / minute Pulse rhythm:   regular BP sitting:   130 / 88  (left arm) Cuff size:   regular  Vitals Entered By: Sydell Axon (November 23, 2008 2:54 PM)                 Referred by:  Everrett Coombe  PCP:  Everrett Coombe  Chief Complaint:  Leg cramps and leg weakness after the cramps.  History of Present Illness: Here due to leg cramps--wakes her up the back of leg--leg weak after last cramp  ~5 nights  ago --no change in usual walking routine, unusual activity    Current Allergies (reviewed today): ! SULFA ! CODEINE ! * NEXIUM ! * DECONGESTANTS ! PROPANTHELINE BROMIDE (PROPANTHELINE BROMIDE) ! DEMEROL (MEPERIDINE HCL)  Past Medical History:    Reviewed history from 08/13/2007 and no changes required:       COPD       Colonic polyps, hx of     Review of Systems      See HPI   Physical Exam  General:     alert, well-developed, well-nourished, and well-hydrated.   Msk:     full ROM of L ankle Extremities:     no pitting  edema either lower leg numerous spider veins throughout upper and lower leg, no varicosities pain in calf with dorsiflexion no hot spot over L calf, but is tender to touch in area of pain with dorsiflexion Neurologic:     alert & oriented X3, sensation intact to light touch, and gait normal.   Skin:     turgor normal and color normal.   Psych:     normally interactive and good eye contact.      Impression & Recommendations:  Problem # 1:  LEG CRAMPS (ICD-729.82) Assessment: New new L leg cramps willl try on Soma 250 at hs--sample given will try tonic water 4 oz at supper and hs next 4-5 nights will check electrolites--notify see back if not improved Orders: Venipuncture (47829) TLB-BMP (Basic Metabolic Panel-BMET) (80048-METABOL)   Complete Medication  List: 1)  Bystolic 2.5mg   .... Take 1 tablet by mouth once a day 2)  Prevacid 30 Mg Cpdr (Lansoprazole) .... Take one by mouth once a day 3)  Zocor 10 Mg Tabs (Simvastatin) .... Take 1 tablet by mouth once a day 4)  Synthroid 75 Mcg Tabs (Levothyroxine sodium) .... Take 1 tablet by mouth once a day 5)  Buspar 5 Mg Tabs (Buspirone hcl) .... 1/2 tab once daily x 1-2 wks, then increase to 1 once daily or as directed 6)  Soma 350 Mg Tabs (Carisoprodol) .Marland Kitchen.. 1 at bedtime as needed leg cramps     Current Allergies (reviewed today): ! SULFA ! CODEINE ! * NEXIUM ! * DECONGESTANTS ! PROPANTHELINE BROMIDE (PROPANTHELINE BROMIDE) ! DEMEROL (MEPERIDINE HCL)

## 2010-11-14 NOTE — Therapy (Signed)
Summary: Audiology  Audiology   Imported By: Beau Fanny 04/08/2007 11:20:21  _____________________________________________________________________  External Attachment:    Type:   Image     Comment:   External Document

## 2010-11-14 NOTE — Letter (Signed)
Summary: [facility]   [facility]   Imported By: Carin Primrose 08/26/2008 10:29:41  _____________________________________________________________________  External Attachment:    Type:   Image     Comment:   External Document

## 2010-11-14 NOTE — Assessment & Plan Note (Signed)
Summary: rov for osa   Referred by:  Everrett Coombe  PCP:  Everrett Coombe  Chief Complaint:  Pt is here for a 4 week f/u appt.  Pt states she is using her cpap machine almost every night.  Pt c/o difficulty exhaling while wearing cpap machine. Pt also c/o nasal congestion making it difficult to breath through nose while wearing mask.  Pt states she now has full face mask.  Marland Kitchen  History of Present Illness: the pt comes in today after her trial of cpap for mild EDS and intermittent breathless episodes during the night.  She has been having issues with expiratory pressure tolerance, and also nasal congestion.  She did go to a full facemask to help solve the congestion issue.  I also asked her to increase the heat on her humidifier to see if that would help.  Despite wearing the cpap, she has still had some of the episodes of where she awakens with breathlessness.  She denies having reflux or chest pain.     Current Allergies: ! SULFA ! CODEINE ! * NEXIUM ! * DECONGESTANTS ! PROPANTHELINE BROMIDE (PROPANTHELINE BROMIDE)     Review of Systems      See HPI   Vital Signs:  Patient Profile:   60 Years Old Female Height:     66.75 inches Weight:      166.38 pounds O2 Sat:      92 % O2 treatment:    Room Air Temp:     97.6 degrees F oral Pulse rate:   82 / minute BP sitting:   120 / 80  (left arm) Cuff size:   regular  Vitals Entered By: Cyndia Diver LPN (September 13, 2008 9:18 AM)             Comments Medications reviewed with patient Cyndia Diver LPN  September 13, 2008 9:18 AM      Physical Exam  General:     wd female in nad Nose:     no skin breakdown or pressure necrosis from the cpap mask.      Impression & Recommendations:  Problem # 1:  OBSTRUCTIVE SLEEP APNEA (ICD-327.23) the pt states that she has been wearing cpap, but doesn't feel any more rested.  She is having issues with exhalation pressure, and I would like to give her one more chance with a bipap device  before giving up on this form of treatment.  The other consideration is whether she should consider nasal surgery, not just for her mild sleep apnea, but also for her chronic nasal congestion that occurs even during the day.  Problem # 2:  DYSPNEA (ICD-786.05) the pt continues to have occasional awakenings with feeling of breathlessness.  It is unclear whether this is due to sleep apnea or not.  She has had a few episodes recently despite wearing cpap, but has issues with pressure tolerance.  Other considerations would be reflux, her nasal obstruction, panic attacks, cardiac disease.  Pulmonary disease such as asthma rarely would present this way.   Patient Instructions: 1)  will try auto-bipap for 2 weeks.  Call me with response. 2)  consider nasal surgery for chronic congestion.   ]

## 2010-11-14 NOTE — Miscellaneous (Signed)
Summary: colonpolyps--neg --12/09  Clinical Lists Changes  Observations: Added new observation of PAST SURG HX: R foot surgery 04/25/07 Hysterectomy (ovaries intact)  EGD - gastritis-- 01/99/// 11/06 Colonoscopy negative-- 01/99/// polyps 11/06--polyps 09/29/08 DEXA - wn-- 02/13/02 MRI L/S basically-- wnl MRI pelvis, right hip ? lesion  Thyroidectomy, total--02/07 MRI brain- meningeal based mass--02/28/07 EMG--egative--02/14/07 Cardiac cath-- negative --02/25/07 2 D Echo- borderline mitral valve prolapse with MR--5/03 Holter negative-- 1995/// palpitations-- 04/08                                                                                                        MRI brain with and without contrast--09/01/07--[facility]--probalbl neg  (10/28/2008 17:58) Added new observation of COLONOSCOPY: Adenomatous Polyp (09/29/2008 17:59)      Past Surgical History:    R foot surgery 04/25/07    Hysterectomy (ovaries intact)        EGD - gastritis-- 01/99/// 11/06    Colonoscopy negative-- 01/99/// polyps 11/06--polyps 09/29/08    DEXA - wn-- 02/13/02    MRI L/S basically-- wnl    MRI pelvis, right hip ? 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

## 2010-11-14 NOTE — Consult Note (Signed)
Summary: [facility]/Dr. [facility]/Dr. Jimmy Footman   Imported By: Eleonore Chiquito 04/27/2008 10:10:13  _____________________________________________________________________  External Attachment:    Type:   Image     Comment:   External Document

## 2010-11-14 NOTE — Procedures (Signed)
Summary: Holter and Event  Holter and Event   Imported By: West Carbo 04/06/2010 11:57:21  _____________________________________________________________________  External Attachment:    Type:   Image     Comment:   External Document

## 2010-11-14 NOTE — Progress Notes (Signed)
Summary: bipap problems  Phone Note Call from Patient   Caller: Patient Call For: Sanoe Hazan Reason for Call: Referral Summary of Call: pt c/o pressure being set too high on bipap. pt says she does fine for first 30 mins then it "blows her away". also has great trouble in sizing of mask. no size has fit so far. work # first [phone] or cell [phone] Initial call taken by: Tivis Ringer,  October 29, 2008 12:37 PM  Follow-up for Phone Call        pt needs ov to discuss also have pcc check with dme and see if she is still on autobipap? Follow-up by: Barbaraann Share MD,  November 01, 2008 9:37 AM  Additional Follow-up for Phone Call Additional follow up Details #1::        Spoke with pt and she is unable to come in this week will call back for appt next week, can you check with her DME to see if she is still on auto bipap Armita [facility], LPN  November 01, 2008 10:16 AM    kc i spoke  to this pt and she is very upset about the bipap she shays it is very hard to get off work and she said the bipap feels like it has a burst of about 4 deep puffs that takes her breath i see w put her on 12/8 10/06/08 she says she is having a hard time with mask fit also and just wants your advise will you call her one day after 4:[phone] Oneita Jolly  November 01, 2008 3:49 PM     Additional Follow-up for Phone Call Additional follow up Details #2::    Spoke with pt and is unable to come in this week will call for appt next week and also sent this to [facility] to check on pts settings and if she is on auto bipap Armita Donzetta [facility]ters RCP, LPN  November 01, 2008 10:17 AM   Additional Follow-up for Phone Call Additional follow up Details #3:: Details for Additional Follow-up Action Taken: call pt at home#, work#, and cell#.  LMOM at all numbers.  asked her to call us with a reliable contact  Additional Follow-up by: Barbaraann Share MD,  November 04, 2008 4:52 PM

## 2010-11-14 NOTE — Assessment & Plan Note (Signed)
Summary: PNEUMONIA SHOT/[facility]  Nurse Visit    Prior Medications: SYNTHROID 88 MCG TABS (LEVOTHYROXINE SODIUM) Take 1 tablet by mouth once a day ZOCOR 20 MG TABS (SIMVASTATIN) .qdtab SINGULAIR 10 MG TABS (MONTELUKAST SODIUM) Take 1 tablet by mouth once a day BYSTOLIC  2.5MG  () Take 1 tablet by mouth once a day ASPIRIN 81 MG TBEC (ASPIRIN) Take 1 tablet by mouth once a day PREVACID 30 MG  CPDR (LANSOPRAZOLE) Take one by mouth once a day AMOXIL 500 MG  CAPS (AMOXICILLIN) 2 two times a day Current Allergies: ! SULFA ! CODEINE ! * NEXIUM ! * DECONGESTANTS ! PROPANTHELINE BROMIDE (PROPANTHELINE BROMIDE)   Pneumovax Vaccine    Vaccine Type: Pneumovax    Site: right deltoid    Mfr: Merck    Dose: 0.5 ml    Route: IM    Given by: Cooper Render    Exp. Date: 03/06/2009    Lot #: [id number]    VIS given: 05/12/96 version given August 11, 2007.   Orders Added: 1)  Pneumococcal Vaccine [90732] 2)  Admin 1st Vaccine [email or web address]    ]

## 2010-11-14 NOTE — Consult Note (Signed)
Summary: [facility] / L Posterior Fossameningioma / Dr.   [facility] / L Posterior Fossameningioma / Dr. Shirlean Kelly   Imported By: Carin Primrose 10/07/2008 09:23:51  _____________________________________________________________________  External Attachment:    Type:   Image     Comment:   External Document

## 2010-11-14 NOTE — Assessment & Plan Note (Signed)
Summary: consult for osa   Referred by:  Everrett Coombe  PCP:  Everrett Coombe  Chief Complaint:  Sleep Consult.  History of Present Illness: the pt is a 60 y/o female who I have been asked to see for sleep disruption.  The pt had a sleep study in [place] last year which showed an ahi of 3/hr, but a rdi of 10/hr.  There was oxygen desaturation as low as 84%.  She did not receive treatment for this, but clearly is having significant sleeping issues currently.  She describes awakenings during the night with breathlessness, but denies any reflux symptoms at that time.  There is no set pattern to these episodes, and sometimes they can occur only 1-2 times a week.  She denies any frank choking arousals.  No one has ever mentioned hearing her snore, or noted pauses in her breathing during sleep.  She typically goes to bed at 9-10 pm, and arises at 530 to start her day.  She feels tired quite often upon arising.  She denies any kicking during the night, or any symptoms c/w the RLS.  She works as a Nutritional therapist during the day, and does note some degree of sleep pressure at times.  She doesn't doze in the evening with inactiivity, and has no issues with sleepiness while driving.  Her weight is down 9 pounds over the last 2 years by her history.     Current Allergies: ! SULFA ! CODEINE ! * NEXIUM ! * DECONGESTANTS ! PROPANTHELINE BROMIDE (PROPANTHELINE BROMIDE)  Past Medical History:    Reviewed history from 08/13/2007 and no changes required:       COPD       Colonic polyps, hx of  Past Surgical History:    Reviewed history from 09/29/2007 and no changes required:       R foot surgery 04/25/07       Hysterectomy (ovaries intact)              EGD - gastritis-- 01/99/// 11/06       Colonoscopy negative-- 01/99/// polyps 11/06       DEXA - wn-- 02/13/02       MRI L/S basically-- wnl       MRI pelvis, right hip ? lesion        Thyroidectomy, total--02/07       MRI brain- meningeal based  mass--02/28/07       EMG--egative--02/14/07       Cardiac cath-- negative --02/25/07       2 D Echo- borderline mitral valve prolapse with MR--5/03       Holter negative-- 1995/// palpitations-- 04/08                                                                                                        MRI brain with and without contrast--09/01/07--[facility]--probalbl neg   Family History:    Reviewed history from 08/13/2007 and no changes required:       Family history - CAD, elevated BP, CVA, DM, lung cancer, COPD  emphysema: father       allergies: daughter       heart disease: father       rheumatism: mother       cancer: brother (lung), mother (colon), father (leukemia), maternal grandfather (stomach)   Social History:    Reviewed history from 03/18/2007 and no changes required:       Marital Status: Married       Children: 2       Occupation: Journalist, newspaper, Clinical cytogeneticist       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    Risk Factors:  Tobacco use:  quit    Year quit:  1998    Pack-years:  less than 1ppd    Comments:  started smoking at age 29  Passive smoke exposure:  no Drug use:  no HIV high-risk behavior:  no Caffeine use:  2 drinks per day Alcohol use:  no Exercise:  yes    Times per week:  5    Type:  walks 5xwk for Seatbelt use:  100 % Sun Exposure:  rarely  Mammogram History:    Date of Last Mammogram:  10/14/2006  PAP Smear History:    Date of Last PAP Smear:  09/14/1998   Review of Systems      See HPI   Vital Signs:  Patient Profile:   60 Years Old Female Height:     66.75 inches Weight:      170.38 pounds O2 Sat:      100 % O2 treatment:    Room Air Temp:     97.5 degrees F oral Pulse rate:   60 / minute BP sitting:   124 / 76  (left arm) Cuff size:   regular  Vitals Entered By: Cyndia Diver LPN (August 16, 2008 12:04 PM)             Is Patient Diabetic? No Comments Medications reviewed with patient Cyndia Diver LPN  August 16, 2008 12:04 PM      Physical Exam  General:     wd female in nad Eyes:     PERRLA and EOMI.   Nose:     narrowed bilaterally Mouth:     mild elongation of uvula, normal palate Neck:     no jvd, tmg, ln Lungs:     clear to auscultation Heart:     rrr, no mrg Abdomen:     soft and nt, bs+ Extremities:     mild edema, mild varicosities Neurologic:     alert and oriented , moves all 4      Impression & Recommendations:  Problem # 1:  OBSTRUCTIVE SLEEP APNEA (ICD-327.23) the pt has a history of very mild osa documented last year, although I cannot say for sure this is responsible for her poor sleep and sleep pressure during the day.  Her episodes of breathlessness during the night could be caused by many things, including her osa.  I have gone over the various treatment options available to her if we assume that osa is the cause of her symptoms.  These include oral appliance, possible surgery, and cpap.  I have told her that I would recommend cpap if she wanted to treat her osa to see if things got better.  This could serve as both a diagnostic and therapeutic trial.  If she then sees improvement with cpap, she could consider oral appliance or surgery as further options.  Medications Added to Medication List This  Visit: 1)  Zocor 10 Mg Tabs (Simvastatin) .... Take 1 tablet by mouth once a day   Patient Instructions: 1)  will try cpap for 4 weeks to see how you respond. 2)  f/u with me in 4 weeks, but call if having issues.   ]

## 2010-11-14 NOTE — Assessment & Plan Note (Signed)
Summary: F6M/AMD   Visit Type:  Follow-up Referring Provider:  Everrett Coombe  Primary Provider:  Everrett Coombe, MD  CC:  shortness of breath with walking.  History of Present Illness: [name] is a very pleasant 60 year old woman with a history of palpitations, remote history of chest pain, hyperlipidemia and hypertension with a strong family history of coronary artery disease whoPresents for routine followup.    overall, she has been doing well. She takes bystolic 2.5 mg every day. She does continue to have rare episodes of palpitations. She walks on a daily basis and has no symptoms of chest discomfort or tachypalpitations. Rarely she has symptoms of shortness of breath but this seems to be rare and typically is able to walk without any exertional symptoms.  EKG shows normal sinus rhythm with rate 72 beats per minute with no significant ST or T wave changes  Current Medications (verified): 1)  Bystolic 5 Mg Tabs (Nebivolol Hcl) .... Take 1/2 Tablet By Mouth Once A Day 2)  Synthroid 88 Mcg Tabs (Levothyroxine Sodium) .... Take 1 By Mouth Once Daily 3)  Nasonex 50 Mcg/act Susp (Mometasone Furoate) .... One Spray Each Nostril Once or Twice Daily 4)  Prevacid 15 Mg Cpdr (Lansoprazole) .... Take One To Two Tabs Daily As Needed 5)  Zocor 20 Mg Tabs (Simvastatin) .... Take One Tablet Daily 6)  Zantac 150 Mg Tabs (Ranitidine Hcl) .... As Needed 7)  Multivitamins   Tabs (Multiple Vitamin) .... One Tab Once Daily 8)  Probiotic  Caps (Probiotic Product) .... Once Daily 9)  Valium 5 Mg Tabs (Diazepam) .Marland Kitchen.. 1 Tab By Mouth Two Times A Day As Needed Anxiety  Allergies (verified): 1)  ! Sulfa 2)  ! Codeine 3)  ! * Nexium 4)  ! * Decongestants 5)  ! Propantheline Bromide (Propantheline Bromide) 6)  ! Demerol (Meperidine Hcl) 7)  ! Darvocet  Past History:  Past Medical History: Last updated: 08/13/2007 COPD Colonic polyps, hx of  Past Surgical History: Last updated: 10/28/2008 R foot  surgery 04/25/07 Hysterectomy (ovaries intact)  EGD - gastritis-- 01/99/// 11/06 Colonoscopy negative-- 01/99/// polyps 11/06--polyps 09/29/08 DEXA - wn-- 02/13/02 MRI L/S basically-- wnl MRI pelvis, right hip ? lesion  Thyroidectomy, total--02/07 MRI brain- meningeal based mass--02/28/07---minimal increase in mass 08/30/08 EMG--egative--02/14/07 Cardiac cath-- negative --02/25/07 2 D Echo- borderline mitral valve prolapse with MR--5/03 Holter negative-- 1995/// palpitations-- 04/08                                                                                                        MRI brain with and without contrast--09/01/07--[facility]--probalbl neg  Family History: Last updated: 02/07/2010 Family history - CAD, elevated BP, CVA, DM, lung cancer, COPD, palpitations  emphysema: father allergies: daughter heart disease: father rheumatism: mother cancer: brother (lung), mother (colon), father (leukemia), maternal grandfather (stomach), Maunt--melanoma   Social History: Last updated: 08/16/2008 Marital Status: Married Children: 2 Occupation: Journalist, newspaper, Clinical cytogeneticist 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   Risk Factors: Alcohol Use: 0 (08/19/2009) Caffeine Use: 3 (08/19/2009) Exercise: yes (08/19/2009)  Risk Factors: Smoking Status:  quit (08/19/2009) Packs/Day: 0.5 (08/19/2009) Passive Smoke Exposure: no (08/06/2008)  Review of Systems       The patient complains of dyspnea on exertion.  The patient denies fever, weight loss, weight gain, vision loss, decreased hearing, hoarseness, chest pain, syncope, peripheral edema, prolonged cough, abdominal pain, incontinence, muscle weakness, depression, and enlarged lymph nodes.         rare SOB  Vital Signs:  Patient profile:   60 year old female Height:      67 inches Weight:      154 pounds BMI:     24.21 Pulse rate:   72 / minute BP sitting:   100 / 68  (left arm) Cuff size:   regular  Vitals Entered By: Hardin Negus, RMA (August 28, 2010 9:51 AM)  Physical Exam  General:  well-appearing woman in no apparent distress, pleasant. Head:  normocephalic, atraumatic, and no abnormalities observed.   Neck:  thyroid surgically absent, scar well healed no JVD and no carotid bruits.   Lungs:  normal respiratory effort, no intercostal retractions, no accessory muscle use, and normal breath sounds.   Heart:  normal rate, regular rhythm, and no murmur.   Abdomen:  soft, non-tender, normal bowel sounds, no distention, no masses, no guarding, no abdominal hernia, no inguinal hernia, no hepatomegaly, and no splenomegaly.   Msk:  no joint swelling, no joint warmth, and no redness over joints.   Pulses:  pulses normal in all 4 extremities Extremities:  No clubbing or cyanosis. Neurologic:  Alert and oriented x 3. Skin:  Intact without lesions or rashes. Psych:  Normal affect.   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 We have suggested that she could increase her beta blocker 5 mg daily. This can also be taken as needed. she is otherwise relatively asymptomatic as these happen sporadically.  Her updated medication list for this problem includes:    Bystolic 5 Mg Tabs (Nebivolol hcl) .Marland Kitchen... Take 1/2 tablet by mouth once a day  Orders: EKG w/ Interpretation (93000)  Problem # 2:  HYPERLIPIDEMIA (ICD-272.4) We have suggested that she check her cholesterol level and she sees her primary care physician in followup. She does have a strong family history of coronary artery disease. We have suggested that she stay on her simvastatin.  Her updated medication list for this problem includes:    Zocor 20 Mg Tabs (Simvastatin) .Marland Kitchen... Take one tablet daily  Problem # 3:  DYSPNEA (ICD-786.05) She has rare episodes of shortness of breath, sometimes at rest and sometimes with exertion. I suspect that these  are unrelated to any underlying cardiac issue as she is able to exercise without symptoms and frequent basis. I've asked her to contact me if her symptoms get worse at which time we will do further testing.  Her updated medication list for this problem includes:    Bystolic 5 Mg Tabs (Nebivolol hcl) .Marland Kitchen... Take 1/2 tablet by mouth once a day

## 2010-11-14 NOTE — Consult Note (Signed)
Summary: [facility]/Consultation Report/Dr. [facility]/Consultation Report/Dr. Newell Coral   Imported By: Mickle Asper 10/31/2007 15:48:48  _____________________________________________________________________  External Attachment:    Type:   Image     Comment:   External Document

## 2010-11-14 NOTE — Assessment & Plan Note (Signed)
Summary: NEW PT;   Referring Provider:  Everrett Coombe  Primary Provider:  Everrett Coombe, MD  CC:  ROV ([facility]); Awakens at night with rapid HR; Anxiety (?).  History of Present Illness: [name] is a very pleasant 60 year old woman with a history of palpitations, remote history of chest pain, hyperlipidemia and hypertension with a strong family history of coronary artery disease who presents to reestablish care. She was last seen at [facility] October 2010.  She states that over the past several weeks to months, she has had more tachypalpitations. It wakes her up at nighttime, typically lasts at least 10 minutes. She has this at least once a week if not more. She tries to breathe through it and it seems to resolve without intervention. She has been taking low dose Bystolic 2.5 milligrams though with no relief. She does state that her thyroid medicine was increased from 75 micrograms to 88 micrograms some months ago. She does not have any followup with a primary care physician or her endocrinologist and is looking for a referral. She is also not had her cholesterol checked in some time.  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) 17)  Colonic Polyps, Hx of  (ICD-V12.72) 18)  COPD Via X-ray  (ICD-496) 19)  Gastritis, Reflex Esophagitis, Duodenitis  (ICD-535.50) 20)  Panic  Attack  (ICD-300.01) 21)  Fibrocystic Breast Disease  (ICD-610.1) 22)  Supraventricular Tachycardia, (HOLTER)  (ICD-427.89) 23)  Goiter, Multicystic  (ICD-240.9) 24)  Tobacco Abuse  (ICD-305.1) 25)  Sjogren's Syndrome  (ICD-710.2) 26)  Mitral Valve Prolapse, + Sbe Prophylaxis  (ICD-424.0) 27)  Sinusitis, Acute  (ICD-461.9) 28)  Vaginitis Nos  (ICD-616.10) 29)  Palpitations  (ICD-785.1) 30)  Hypothyroidism  (ICD-244.9) 31)  Hyperthyroidism  (ICD-242.90) 32)  Preoperative Examination  (ICD-V72.84)  Medications Prior to Update: 1)  Bystolic  2.5mg  .... Take 1 Tablet By Mouth Once A Day 2)  Synthroid 75 Mcg  Tabs (Levothyroxine Sodium) .... Take 1 Tablet By Mouth Once A Day 3)  Nasonex 50 Mcg/act Susp (Mometasone Furoate) .... One Spray Each Nostril Once or Twice Daily 4)  Prevacid 15 Mg Cpdr (Lansoprazole) .... Take One To Two Tabs Daily 5)  Zocor 20 Mg Tabs (Simvastatin) .... Take One Tablet Daily 6)  Zantac 150 Mg Tabs (Ranitidine Hcl) .... Otc As Directed. 7)  Multivitamins   Tabs (Multiple Vitamin) .... One Tab Twice A Day 8)  Vitamin D 50,000  Units .... One Every Other Week  Current Medications (verified): 1)  Bystolic 2.5 Mg Tabs (Nebivolol Hcl) .... Take 1 By Mouth Once Daily 2)  Synthroid 88 Mcg Tabs (Levothyroxine Sodium) .... Take 1 By Mouth Once Daily 3)  Nasonex 50 Mcg/act Susp (Mometasone Furoate) .... One Spray Each Nostril Once or Twice Daily 4)  Prevacid 15 Mg Cpdr (Lansoprazole) .... Take One To Two Tabs Daily 5)  Zocor 20  Mg Tabs (Simvastatin) .... Take One Tablet Daily 6)  Zantac 150 Mg Tabs (Ranitidine Hcl) .... Otc As Directed. 7)  Multivitamins   Tabs (Multiple Vitamin) .... One Tab Twice A Day 8)  Align  Caps (Probiotic Product) .... Take 1 By Mouth Once Daily  Allergies: 1)  ! Sulfa 2)  ! Codeine 3)  ! * Nexium 4)  ! * Decongestants 5)  ! Propantheline Bromide (Propantheline Bromide) 6)  ! Demerol (Meperidine Hcl) 7)  ! Darvocet  Past History:  Past  Medical History: Last updated: 08/13/2007 COPD Colonic polyps, hx of  Past Surgical History: Last updated: 10/28/2008 R foot surgery 04/25/07 Hysterectomy (ovaries intact)  EGD - gastritis-- 01/99/// 11/06 Colonoscopy negative-- 01/99/// polyps 11/06--polyps 09/29/08 DEXA - wn-- 02/13/02 MRI L/S basically-- wnl MRI pelvis, right hip ? lesion  Thyroidectomy, total--02/07 MRI brain- meningeal based mass--02/28/07---minimal increase in mass 08/30/08 EMG--egative--02/14/07 Cardiac cath-- negative --02/25/07 2 D Echo- borderline mitral valve prolapse with MR--5/03 Holter negative-- 1995/// palpitations-- 04/08                                                                                                        MRI brain with and without contrast--09/01/07--[place]--probalbl neg  Family History: Last updated: 02/07/2010 Family history - CAD, elevated BP, CVA, DM, lung cancer, COPD, palpitations  emphysema: father allergies: daughter heart disease: father rheumatism: mother cancer: brother (lung), mother (colon), father (leukemia), maternal grandfather (stomach), Maunt--melanoma   Social History: Last updated: 08/16/2008 Marital Status: Married Children: 2 Occupation: Journalist, newspaper, Clinical cytogeneticist 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   Risk Factors: Alcohol Use: 0 (08/19/2009) Caffeine Use: 3 (08/19/2009) Exercise: yes (08/19/2009)  Risk Factors: Smoking Status: quit (08/19/2009) Packs/Day: 0.5 (08/19/2009) Passive Smoke Exposure: no (08/06/2008)  Review of Systems       The patient complains of weight loss.  The patient denies fever, weight gain, vision loss, decreased hearing, hoarseness, chest pain, syncope, dyspnea on exertion, peripheral edema, prolonged cough, abdominal pain, incontinence, muscle weakness, depression, and enlarged lymph nodes.         Tachypalpitatations, fluttering in chest  Vital Signs:  Patient profile:   59 year old female Height:      67  inches Weight:      152.75 pounds BMI:     24.01 Pulse rate:   73 / minute BP sitting:   132 / 82  (right arm) Cuff size:   regular  Vitals Entered By: Stanton Kidney, EMT-P (Feb 24, 2010 10:33 AM)  Physical Exam  General:  well-appearing woman in no apparent distress, HEENT exam is benign, oropharynx is clear, neck is supple with no JVP or carotid bruits, heart sounds are regular with S1-S2 and no murmurs appreciated, lungs are clear to auscultation with no wheezes Rales, abdominal exam is benign, no significant lower extremity edema, neurologic exam is nonfocal, skin is warm and dry, pulses are equal and symmetrical in her upper and lower extremities.   New Orders:     1)  T-Lipid Profile ([id number])     2)  T-Hepatic Function [id number])  3)  T-TSH [id number])   EKG  Procedure date:  02/24/2010  Findings:      normal sinus rhythm with a rate of 73 beats per minute, no significant ST or T wave changes.  Impression & Recommendations:  Problem # 1:  PALPITATIONS, RECURRENT (ICD-785.1) Assessment Unchanged recent worsening of her tachycardia palpitations and fluttering particularly at nighttime. She is very concerned about her symptoms as they seem to wake her up at nighttime. Chest has fluttering during the day this seems to be different from the daytime as they are very brief period  We ordered an event monitor for her as her symptoms seem to be less than once a day, sometimes once or twice a week. while she wears a monitor and as we get recordings back, we could increase this to a higher dose of beta blocker that she did take in the evenings as opposed to the morning.  Her updated medication list for this problem includes:    Bystolic 2.5 Mg Tabs (Nebivolol hcl) .Marland Kitchen... Take 1 by mouth once daily  Problem # 2:  HYPERLIPIDEMIA (ICD-272.4) We have ordered a lipid panel and TSH as this has not been done in some time.  Her updated medication list for this problem  includes:    Zocor 20 Mg Tabs (Simvastatin) .Marland Kitchen... Take one tablet daily  Orders: T-Lipid Profile [id number]) T-Hepatic Function [id number])  Other Orders: T-TSH ([id number])  Patient Instructions: 1)  Your physician recommends that you schedule a follow-up appointment in: 6 months 2)  Your physician recommends that you have a FASTING lipid profile: done at [facility] (lipid/liver) 3)  Your physician has recommended that you wear an event monitor.  Event monitors are medical devices that record the heart's electrical activity. Doctors most often use these monitors to diagnose arrhythmias. Arrhythmias are problems with the speed or rhythm of the heartbeat. The monitor is a small, portable device. You can wear one while you do your normal daily activities. This is usually used to diagnose what is causing palpitations/syncope (passing out).

## 2010-11-14 NOTE — Assessment & Plan Note (Signed)
Summary: PROBLEMS WITH LEGS/RBH   Vital Signs:  Patient Profile:   60 Years Old Female Height:     68 inches Weight:      175 pounds Temp:     97.5 degrees F oral Pulse rate:   90 / minute BP sitting:   132 / 81  (left arm) Cuff size:   regular  Vitals Entered By: Cooper Render (October 07, 2007 3:54 PM)                 Chief Complaint:  leg problems and on levaquin for sinus.  History of Present Illness: Here due to problems getting legs to work at times: --On Father's Day (6/08) had problem stepping up, fell with fx of 5th toe--saw podiatrist.  continues to have pain in R hip. --In October she could not rise from seated position on flight of steps, legs would not let her rise, partially up and fell onto banister. --Over week end went out with group, tried to get into the third row of seats--required some one pulling and some one pushing to get her in and out.  not able to get legs to pull her into position.  Had MRI of brain 09/01/07, ordered by Dr Ebbie Ridge unchanged.  Additional finding of abnormal signal in white matter adjacent to frontal horn of L lateral ventricle--not diagnostic of MS.  Current Allergies (reviewed today): ! SULFA ! CODEINE ! * NEXIUM ! * DECONGESTANTS ! PROPANTHELINE BROMIDE (PROPANTHELINE BROMIDE) Updated/Current Medications (including changes made in today's visit):  SYNTHROID 88 MCG TABS (LEVOTHYROXINE SODIUM) Take 1 tablet by mouth once a day ZOCOR 20 MG TABS (SIMVASTATIN) .qdtab SINGULAIR 10 MG TABS (MONTELUKAST SODIUM) Take 1 tablet by mouth once a day * BYSTOLIC  2.5MG  Take 1 tablet by mouth once a day ASPIRIN 81 MG TBEC (ASPIRIN) Take 1 tablet by mouth once a day PREVACID 30 MG  CPDR (LANSOPRAZOLE) Take one by mouth once a day      Review of Systems      See HPI   Physical Exam  General:     alert, well-developed, and well-nourished.  anxious Eyes:     pupils equal, pupils round, and no injection.   Lungs:  normal respiratory effort, no accessory muscle use, and normal breath sounds.   Heart:     normal rate, regular rhythm, and no murmur.   Neurologic:     alert & oriented X3, strength normal in all extremities, sensation intact to light touch, gait normal, DTRs symmetrical and normal, and Romberg negative--no palmer drift Skin:     turgor normal, color normal, and no rashes.   Psych:     Oriented X3, good eye contact, and slightly anxious.      Impression & Recommendations:  Problem # 1:  MUSCLE WEAKNESS (GENERALIZED) (ICD-728.87) Assessment: New will refer to neurology for eval due to new sx that she did not discuss with Dr Jule Ser and the MRI which she has read. my exam finds no deficites Orders: Neurology Referral (Neuro)   Problem # 2:  HIP PAIN, RIGHT (ICD-719.45) Assessment: Unchanged will get xrays of R hip and pelvis due to the prolonged sx of pain and not being able to lie on at night. refer if needed. Her updated medication list for this problem includes:    Aspirin 81 Mg Tbec (Aspirin) .Marland Kitchen... Take 1 tablet by mouth once a day  Orders: Radiology other (Radiology Other)   Complete Medication List: 1)  Synthroid 88 Mcg Tabs (  Levothyroxine sodium) .... Take 1 tablet by mouth once a day 2)  Zocor 20 Mg Tabs (Simvastatin) .... .qdtab 3)  Singulair 10 Mg Tabs (Montelukast sodium) .... Take 1 tablet by mouth once a day 4)  Bystolic 2.5mg   .... Take 1 tablet by mouth once a day 5)  Aspirin 81 Mg Tbec (Aspirin) .... Take 1 tablet by mouth once a day 6)  Prevacid 30 Mg Cpdr (Lansoprazole) .... Take one by mouth once a day     ]

## 2010-11-15 ENCOUNTER — Telehealth: Payer: Self-pay | Admitting: Family Medicine

## 2010-11-16 NOTE — Letter (Signed)
Summary: [facility]  [facility]   Imported By: Lester Greenwood 10/17/2010 10:02:26  _____________________________________________________________________  External Attachment:    Type:   Image     Comment:   External Document  Appended Document: [facility] no change in meningioma, repeat MRI in 3 years.

## 2010-11-17 NOTE — Consult Note (Signed)
Summary: [facility]/Consultation Report/E  [facility]/Consultation Report/Elmira Powell,PA-C   Imported By: Mickle Asper 10/01/2007 14:39:13  _____________________________________________________________________  External Attachment:    Type:   Image     Comment:   External Document

## 2010-11-22 ENCOUNTER — Encounter: Payer: Self-pay | Admitting: Family Medicine

## 2010-11-22 NOTE — Progress Notes (Signed)
Summary: refill request for vitamin D  Phone Note Refill Request Message from:  Fax from Pharmacy  Refills Requested: Medication #1:  VITAMIN D3 50000 UNIT CAPS 1 tab by mouth weekly x 8 weeks..   Last Refilled: 09/18/2010 [facility] [address], request is for D2.  Is pt still supposed to be taking this?  Initial call taken by: Lowella Petties CMA, AAMA,  November 15, 2010 4:28 PM  Follow-up for Phone Call        needs to have Vit D level checked. Ruthe Mannan MD  November 16, 2010 7:55 AM  Patient advised as instructed via telephone, she would like a written order to have labs drawn at [facility] mailed to her home address.  Linde Gillis CMA Duncan Dull)  November 16, 2010 8:39 AM    Additional Follow-up for Phone Call Additional follow up Details #1::        in my box. Ruthe Mannan MD  November 16, 2010 11:44 AM  Order mailed to patients home address.  Additional Follow-up by: Linde Gillis CMA Duncan Dull),  November 16, 2010 12:08 PM

## 2010-11-27 ENCOUNTER — Telehealth: Payer: Self-pay | Admitting: Family Medicine

## 2010-11-30 NOTE — Letter (Signed)
Summary: [facility]   Imported By: Kassie Mends 11/22/2010 08:35:08  _____________________________________________________________________  External Attachment:    Type:   Image     Comment:   External Document

## 2010-12-06 NOTE — Progress Notes (Signed)
Summary: regarding vitamin D  Phone Note Call from Patient Call back at Home Phone [phone]   Caller: Patient Call For: Meghan Mannan MD Summary of Call: Pt states she got her vitamin d results off phone tree, but she is asking if she is to continue to take this.  She says her level usually drops if she takes OTC.  Please advise. Initial call taken by: Lowella Petties CMA, AAMA,  November 27, 2010 4:50 PM  Follow-up for Phone Call        60 is a good level but I am concerned that high dose is not necessary at this time.  We can send in a prescription for a lower dose like 2000 International Units daily and then recheck levels in 3-6 months . Meghan Mannan MD  November 28, 2010 7:29 AM  Patient advised as instructed via telephone.  She would like a Rx for the lower dose [facility] [address]  Will call back to schedule labs.  Linde Gillis CMA Duncan Dull)  November 28, 2010 9:10 AM      New/Updated Medications: VITAMIN D 2000 UNIT TABS (CHOLECALCIFEROL) 1 tab by mouth daily. Prescriptions: VITAMIN D 2000 UNIT TABS (CHOLECALCIFEROL) 1 tab by mouth daily.  #90 x 1   Entered and Authorized by:   Meghan Mannan MD   Signed by:   Meghan Mannan MD on 11/28/2010   Method used:   [facility]ically to        [facility] [employer] [phone]* (retail)       [address] [place], [place]  [address]       Ph: [phone] or [phone]       Fax: [phone]   RxID:   [id number]

## 2011-02-19 ENCOUNTER — Telehealth: Payer: Self-pay | Admitting: *Deleted

## 2011-02-19 NOTE — Telephone Encounter (Signed)
Order for labs mailed to patients home address. 

## 2011-02-19 NOTE — Telephone Encounter (Signed)
Order on my desk.

## 2011-02-19 NOTE — Telephone Encounter (Signed)
Patient thinks that she is due to have her vitamin D level checked again in May. Patient would like for you to send her a written order so that she can get it drawn at [facility].

## 2011-03-02 NOTE — Op Note (Signed)
NAME:  Meghan Mack, Meghan Mack                ACCOUNT NO.:  [id number]   MEDICAL RECORD NO.:  [id number]          PATIENT TYPE:  OIB   LOCATION:  0098                         FACILITY:  [facility]   PHYSICIAN:  Velora Heckler, MD      DATE OF BIRTH:  1950/12/27   DATE OF PROCEDURE:  11/19/2005  DATE OF DISCHARGE:                                 OPERATIVE REPORT   PREOPERATIVE DIAGNOSIS:  Right thyroid nodule with atypia.   POSTOPERATIVE DIAGNOSIS:  Multiple thyroid nodules, bilateral, Hashimoto's  thyroiditis.   PROCEDURE:  Total thyroidectomy.   SURGEON:  Velora Heckler, MD, FACS   ASSISTANT:  Lorelee New, MD, FACS   ANESTHESIA:  General.   ESTIMATED BLOOD LOSS:  Minimal.   PREPARATION:  Betadine.   COMPLICATIONS:  None.   INDICATIONS:  The patient is a 60 year old white female from [place],  [place].  She is referred by Dr. Dorisann Frames, to our practice for  thyroidectomy for multiple thyroid nodules.  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  The patient now comes  to surgery for resection.   The procedure is done in OR #6 at the [facility].  The  patient is brought to the operating room, placed in a supine position on the  operating room table.  Following administration of general anesthesia, the  patient is positioned and then prepped and draped in the usual strict  aseptic fashion.  After ascertaining that an adequate level of anesthesia  had been obtained, a Kocher incision is made a #15 blade.  Dissection is  carried down through subcutaneous tissues and platysma.  Hemostasis is  obtained with the electrocautery.  Skin flaps are elevated cephalad and  caudad from the thyroid notch to the sternal notch.  A Mahorner self-  retaining retractor is placed for exposure.  Strap muscles are incised in  the midline; dissection is begun on  the left side.  Left thyroid lobe shows  multiple small nodules.  There is a firm, dense nodule measuring  approximately 8 mm in the mid lower pole.  This is suspicious for neoplasm.  Examination of the right thyroid lobe also shows multiple nodules.  The  largest is in the right superior pole and measures about 2 cm in size.  This  is the nodule that been previously biopsied showing Hurthle cell change and  cytologic atypia.   Therefore, we began our dissection to the right.  Strap muscles are  reflected laterally.  Right thyroid lobe is exposed.  Venous tributaries are  divided between medium Ligaclips.  Gland is mobilized laterally.  Superior  pole is exposed.  Superior pole vessels are ligated in continuity with 2-0  silk ties and medium Ligaclips and divided.  Care is taken to preserve a  neurologic structure seen quite high on the right pole.  This may represent  a hypoglossal nerve.  However, it more likely represents either a  nonrecurrent nerve or a superior laryngeal nerve.  At  any rate, it is spared  during the dissection.  Right lobe is then mobilized inferiorly with  inferior venous tributaries ligated in continuity and divided.  Gland is  rolled anteriorly.  Inferior thyroid artery is divided between medium  Ligaclips.  Superior and inferior parathyroid glands are identified and  preserved.  They are mobilized off the thyroid capsule on their vascular  pedicle.  Recurrent nerve is identified and preserved.  Gland is dissected  down to the ligament of Allyson Sabal which is transected with the electrocautery,  and the gland is rolled up and onto the anterior trachea.  It is mobilized  across the midline.  Isthmus is then transected between hemostats and suture  ligated with 3-0 Vicryl suture ligatures.  Right thyroid lobe is submitted  to pathology.  Frozen section analysis shows changes consistent with  Hashimoto's thyroiditis.  The largest nodule is felt to likely be a   hyperplastic nodule.  No sign of papillary cancer is identified.   Right neck is irrigated with warm saline and dry pack is placed.  Good  hemostasis is noted.  We then turned our attention to the left thyroid lobe.  Due to the multiple nodules and to the suspicious nodule in the inferior  portion of the left lobe, a decision is made to proceed with total  thyroidectomy.  Therefore, middle thyroid vein is divided between medium  Ligaclips.  Inferior venous tributaries are ligated in continuity with 2-0  silk ties and divided.  Superior pole is dissected out.  Superior pole  vessels are ligated in continuity with 2-0 silk ties and medium Ligaclips  and divided.  Gland is rolled anteriorly.  Both the superior and inferior  parathyroid glands are identified.  Recurrent nerve is identified and  preserved.  Branches of the inferior thyroid artery are divided between  small Ligaclips.  Dissection is carried down onto the ligament of Allyson Sabal  which is transected with the electrocautery.  Remaining portion of the left  lobe is then excised off the trachea, and it is submitted to pathology for  permanent section analysis only.  Left neck is irrigated with warm saline  and good hemostasis achieved.  Surgicel is placed over the area of the  recurrent nerve and parathyroid glands.  Right thyroid bed is also  irrigated, and Surgicel is placed.  Good hemostasis is noted throughout.  Strap muscles are reapproximated in the midline with interrupted 3-0 Vicryl  sutures.  Platysma is closed with interrupted 3-0 Vicryl sutures.  Skin is  closed with a running 4-0 Vicryl subcuticular suture.  Wound is washed and  dried, and Benzoin and Steri-Strips are applied.  Sterile dressings are  applied.  The patient is awakened from anesthesia and brought to the  recovery room in stable condition.      Velora Heckler, MD  Electronically Signed    TMG/MEDQ  D:  11/19/2005  T:  11/19/2005  Job:  [id number]   cc:    Dorisann Frames, M.D.  Fax: [phone]   Legrand Rams, PA  [facility]

## 2011-03-05 ENCOUNTER — Ambulatory Visit (INDEPENDENT_AMBULATORY_CARE_PROVIDER_SITE_OTHER)
Admission: RE | Admit: 2011-03-05 | Discharge: 2011-03-05 | Disposition: A | Discharge status: Home / Self Care | Payer: 59 | Source: Ambulatory Visit | Attending: Family Medicine | Admitting: Family Medicine

## 2011-03-05 ENCOUNTER — Ambulatory Visit (INDEPENDENT_AMBULATORY_CARE_PROVIDER_SITE_OTHER): Payer: 59 | Admitting: Family Medicine

## 2011-03-05 ENCOUNTER — Encounter: Payer: Self-pay | Admitting: Family Medicine

## 2011-03-05 VITALS — BP 130/80 | HR 96 | Temp 97.9°F | Wt 157.1 lb

## 2011-03-05 DIAGNOSIS — R05 Cough: Secondary | ICD-10-CM

## 2011-03-05 DIAGNOSIS — R059 Cough, unspecified: Secondary | ICD-10-CM

## 2011-03-05 LAB — HM MAMMOGRAPHY

## 2011-03-05 LAB — HM SIGMOIDOSCOPY

## 2011-03-05 LAB — HM COLONOSCOPY

## 2011-03-05 NOTE — Progress Notes (Signed)
60 yo here for cough.  Past two weeks, dry cough, particularly at night. When she is talking on phone, sometimes feels a tickle in her throat. No wheezing, sputum production, SOB or chest pain. She is a former smoker.  H/o anxiety and she is concerned she has lung cancer. Family h/o lung CA and COPD.  Does have h/o reflux. Has not been taking her Zantac or Prevacid regularly.    The PMH, PSH, Social History, Family History, Medications, and allergies have been reviewed in [facility], and have been updated if relevant.   Review of Systems       See HPI General:  Denies malaise. Eyes:  Denies blurring. ENT:  Denies difficulty swallowing. CV:  Denies chest pain or discomfort. Resp:  Denies shortness of breath. MS:  Denies joint pain, joint redness, and joint swelling. Derm:  Denies rash. Neuro:  Denies headaches. Psych:  Complains of anxiety; denies depression. Endo:  Denies cold intolerance and heat intolerance.  Physical Exam BP 130/80  Pulse 96  Temp(Src) 97.9 F (36.6 C) (Oral)  Wt 157 lb 1.9 oz (71.269 kg) General:  well-appearing woman in no apparent distress, pleasant. Head:  Normocephalic and atraumatic without obvious abnormalities. No apparent alopecia or balding. Eyes:  vision grossly intact, pupils equal, pupils round, and pupils reactive to light.   Ears:  R ear normal and L ear normal.   Nose:  no external deformity.   Mouth:  good dentition.   Neck:  No deformities, masses, or tenderness noted. Lungs:  Normal respiratory effort, chest expands symmetrically. Lungs are clear to auscultation, no crackles or wheezes. Heart:  Normal rate and regular rhythm. S1 and S2 normal without gallop, murmur, click, rub or other extra sounds. AExtremities:  no edema Neurologic:  alert & oriented X3 and gait normal.   Skin:  Intact without suspicious lesions or rashes Psych:  Cognition and judgment appear intact. Alert and cooperative with normal attention span and concentration. No  apparent delusions, illusions, hallucinations

## 2011-03-05 NOTE — Assessment & Plan Note (Signed)
New. Exam unremarkable. Likely reflux but given patient's level of anxiety and personal h/o abnormal CXR in past, will get 2 view CXR today. The patient indicates understanding of these issues and agrees with the plan.

## 2011-03-07 ENCOUNTER — Encounter: Payer: Self-pay | Admitting: Family Medicine

## 2011-04-23 ENCOUNTER — Other Ambulatory Visit: Payer: Self-pay | Admitting: Family Medicine

## 2011-04-23 ENCOUNTER — Telehealth: Payer: Self-pay | Admitting: *Deleted

## 2011-04-23 DIAGNOSIS — Z1231 Encounter for screening mammogram for malignant neoplasm of breast: Secondary | ICD-10-CM

## 2011-04-23 NOTE — Telephone Encounter (Signed)
Order for labs mailed to patients home address.

## 2011-04-23 NOTE — Telephone Encounter (Signed)
Pt is coming in for physical in the fall and is asking that an order for lab work be mailed to her home address.

## 2011-04-23 NOTE — Telephone Encounter (Signed)
Rx in my box.

## 2011-04-27 ENCOUNTER — Telehealth: Payer: Self-pay | Admitting: *Deleted

## 2011-04-27 NOTE — Telephone Encounter (Signed)
Patient says that she received her order for her labs and she said that she was really wanting to have a cbc done and that wasn't on the order. She is asking if an order for a cbc can be mailed to her home.

## 2011-04-27 NOTE — Telephone Encounter (Signed)
Ordered written on Rx paper, CBC dx 785.9-Tachycardia.  Used Dr. Elmer Sow signature stamp.  Will mail to patients home address.

## 2011-04-27 NOTE — Telephone Encounter (Signed)
Yes ok to add the order and send it to her home.

## 2011-06-20 ENCOUNTER — Telehealth: Payer: Self-pay

## 2011-06-20 MED ORDER — NEBIVOLOL HCL 5 MG PO TABS: 5.0000 mg | ORAL_TABLET | Freq: Every day | ORAL | Status: DC

## 2011-06-20 NOTE — Telephone Encounter (Signed)
Requested bystolic 5 mg take one tablet daily for 90 day supply [facility]

## 2011-06-22 ENCOUNTER — Other Ambulatory Visit: Payer: Self-pay | Admitting: *Deleted

## 2011-06-22 MED ORDER — NEBIVOLOL HCL 5 MG PO TABS: 5.0000 mg | ORAL_TABLET | Freq: Every day | ORAL | Status: DC

## 2011-06-25 ENCOUNTER — Ambulatory Visit (INDEPENDENT_AMBULATORY_CARE_PROVIDER_SITE_OTHER): Payer: 59 | Admitting: Family Medicine

## 2011-06-25 ENCOUNTER — Encounter: Payer: Self-pay | Admitting: Family Medicine

## 2011-06-25 DIAGNOSIS — E559 Vitamin D deficiency, unspecified: Secondary | ICD-10-CM

## 2011-06-25 DIAGNOSIS — I4902 Ventricular flutter: Secondary | ICD-10-CM

## 2011-06-25 DIAGNOSIS — R002 Palpitations: Secondary | ICD-10-CM

## 2011-06-25 DIAGNOSIS — E0590 Thyrotoxicosis, unspecified without thyrotoxic crisis or storm: Secondary | ICD-10-CM

## 2011-06-25 DIAGNOSIS — I498 Other specified cardiac arrhythmias: Secondary | ICD-10-CM

## 2011-06-25 DIAGNOSIS — R42 Dizziness and giddiness: Secondary | ICD-10-CM

## 2011-06-25 NOTE — Progress Notes (Signed)
60 yo here for several episodes of fatigue and dizziness.    Has had three episodes in last 6 weeks of shakiness and weakness. No CP or SOB. No n/v/d. No palpitations. Pt is taking synthroid 88 mcg. TSH not checked recently.  Sees Dr. Mariah Milling for occassional palpitations/flutters.   The PMH, PSH, Social History, Family History, Medications, and allergies have been reviewed in [facility], and have been updated if relevant.   Review of Systems       See HPI General:  Complains of fatigue Eyes:  Denies blurring. ENT:  Denies difficulty swallowing. CV:  Denies chest pain or discomfort. Resp:  Denies shortness of breath. MS:  Denies joint pain, joint redness, and joint swelling. Derm:  Denies rash. Neuro:  Denies headaches. Endo:  Denies cold intolerance and heat intolerance.  Physical Exam BP 110/80  Pulse 68  Temp(Src) 98.1 F (36.7 C) (Oral)  Wt 156 lb 12 oz (71.101 kg) General:  well-appearing woman in no apparent distress, pleasant. Head:  Normocephalic and atraumatic without obvious abnormalities. No apparent alopecia or balding. Eyes:  vision grossly intact, pupils equal, pupils round, and pupils reactive to light.   Ears:  R ear normal and L ear normal.   Nose:  no external deformity.   Mouth:  good dentition.   Neck:  No deformities, masses, or tenderness noted. Lungs:  Normal respiratory effort, chest expands symmetrically. Lungs are clear to auscultation, no crackles or wheezes. Heart:  Normal rate and regular rhythm. S1 and S2 normal without gallop, murmur, click, rub or other extra sounds. AExtremities:  no edema Neurologic:  alert & oriented X3 and gait normal.   Skin:  Intact without suspicious lesions or rashes Psych:  Cognition and judgment appear intact. Alert and cooperative with normal attention span and concentration. No apparent delusions, illusions, hallucinations  Assessment and Plan: 1 Dizziness  T4, free, CBC w/Diff, HgB A1c   New.  Exam unremarkable.   EKG essentially unchanged from prior.  Will forward to Dr. Mariah Milling for his review. Will check labs today to rule out other causes of possible dizziness. Orders Placed This Encounter  Procedures  . TSH  . T4, free  . CBC w/Diff  . Vitamin D (25 hydroxy)  . HgB A1c  . EKG 12-Lead

## 2011-06-25 NOTE — Progress Notes (Signed)
Addended by: Dianne Dun on: 06/25/2011 04:33 PM   Modules accepted: Orders

## 2011-06-29 ENCOUNTER — Telehealth: Payer: Self-pay | Admitting: *Deleted

## 2011-06-29 NOTE — Telephone Encounter (Signed)
Pt calling about ekg changes, Jasmine December had discussed with you earlier regarding recent EKG at Dr. Elmer Sow office compared to previous in Nov 2011. I told pt that her EKG is unchanged, still NSR. She is just curious about her symptoms of recent "weakness," and just wanted to make sure you did not have any other suggestions regarding symptoms. There is office note from Dr. Dayton Martes from 06/25/11.

## 2011-07-03 ENCOUNTER — Ambulatory Visit: Payer: Self-pay | Admitting: Unknown Physician Specialty

## 2011-07-03 NOTE — Telephone Encounter (Signed)
EKG looks great. No problems noted.

## 2011-07-04 NOTE — Telephone Encounter (Signed)
Notified pt TG confirmed normal EKG. F/u with pt's symptoms of spoken "weakness," episodes; and she states she will be walking and at random will get "shaky, very tired, hands tremble." PCP has evaluated thyroid, glucose, and now EKG all of which were normal. Pt denies SOB or CP w/ these episodes. Pt unable to take BP during these episodes as well, however, most recent at home reading was 98/68. I suggested pt monitor theses symptoms, if they become more frequent or continue to have, to call our office. TG may want to evaluate possible holter. She had worn an event monitor in 2011, but feels her symptoms were not captured. Told pt to call if having episode and we could see if we have an opening at the time to do and EKG to capture any possible abnormalities, otherwise will need to f/u with TG. Pt ok with this and will keep [place] informed.

## 2011-07-06 ENCOUNTER — Ambulatory Visit: Payer: Self-pay | Admitting: Unknown Physician Specialty

## 2011-07-24 ENCOUNTER — Other Ambulatory Visit: Payer: Self-pay

## 2011-07-24 MED ORDER — SIMVASTATIN 20 MG PO TABS: 20.0000 mg | ORAL_TABLET | Freq: Every day | ORAL | Status: DC

## 2011-07-24 NOTE — Telephone Encounter (Signed)
[facility] day supply for simvastatin.

## 2011-08-07 ENCOUNTER — Encounter (INDEPENDENT_AMBULATORY_CARE_PROVIDER_SITE_OTHER): Payer: Self-pay | Admitting: Surgery

## 2011-08-08 ENCOUNTER — Ambulatory Visit (INDEPENDENT_AMBULATORY_CARE_PROVIDER_SITE_OTHER): Payer: 59 | Admitting: Surgery

## 2011-08-08 ENCOUNTER — Encounter (INDEPENDENT_AMBULATORY_CARE_PROVIDER_SITE_OTHER): Payer: Self-pay | Admitting: Surgery

## 2011-08-08 VITALS — BP 136/94 | HR 64 | Temp 97.7°F | Resp 18 | Ht 67.0 in | Wt 157.0 lb

## 2011-08-08 DIAGNOSIS — K824 Cholesterolosis of gallbladder: Secondary | ICD-10-CM

## 2011-08-08 NOTE — Progress Notes (Signed)
Chief Complaint  Patient presents with  . New Evaluation    eval GB stones/polyps - referral from Amedeo Kinsman, NP, and Dr. Ruthe Mannan, [facility]    HISTORY: Patient is a 60 year old white female known to my surgical practice from prior thyroidectomy for Hashimoto's thyroiditis. Patient is now referred for chronic symptoms of intermittent nausea and intermittent gaseous distention and discomfort. Patient has been undergoing a workup by her gastroenterologist. She did have an abdominal ultrasound performed which shows either tiny gallstones or tiny polyps on the gallbladder wall. There was no sign of inflammation. There is no sign of ductal obstruction. Liver function tests were normal. Nuclear medicine hepatobiliary scan was normal with a normal gallbladder ejection fraction.  Patient has no prior history of jaundice or acholic stools. She denies fevers or chills with her attacks. There is no family history of gallbladder disease. Patient has had no history of hepatitis or pancreatitis.   Past Medical History  Diagnosis Date  . COPD (chronic obstructive pulmonary disease)   . Colonic polyp   . IBS (irritable bowel syndrome)   . Hyperlipidemia   . Anemia   . Palpitations   . Constipation   . Nausea      Current Outpatient Prescriptions  Medication Sig Dispense Refill  . Cholecalciferol (VITAMIN D) 2000 UNITS CAPS Take 1 capsule by mouth daily.        . diazepam (VALIUM) 5 MG tablet Take 5 mg by mouth 2 (two) times daily as needed.        . lansoprazole (PREVACID) 15 MG capsule Take one to two tablets daily as needed       . levothyroxine (SYNTHROID, LEVOTHROID) 88 MCG tablet Take 88 mcg by mouth daily.        . mometasone (NASONEX) 50 MCG/ACT nasal spray 1 spray by Nasal route daily.        . Multiple Vitamin (MULTIVITAMIN PO) Take 1 tablet by mouth daily.        . nebivolol (BYSTOLIC) 5 MG tablet Take 1 tablet (5 mg total) by mouth daily. Take 1/2 tablet by mouth once a day   90 tablet  3  . Probiotic Product (PROBIOTIC PO) Take 1 tablet by mouth daily.        . ranitidine (ZANTAC) 150 MG tablet Take 150 mg by mouth as needed.        . simvastatin (ZOCOR) 20 MG tablet Take 1 tablet (20 mg total) by mouth daily.  90 tablet  3     Allergies  Allergen Reactions  . Codeine   . Esomeprazole Magnesium     REACTION: nausea  . Meperidine Hcl     REACTION: Causes heart to race  . Propantheline Bromide   . Propoxyphene N-Acetaminophen     REACTION: heart race  . Sulfonamide Derivatives      Family History  Problem Relation Age of Onset  . Cancer Mother     colon  . Emphysema Father   . Heart disease Father   . Cancer Father     leukemia  . Cancer Brother     lung  . Cancer Maternal Aunt     melanoma  . Cancer Maternal Grandfather     stomach     History   Social History  . Marital Status: Married    Spouse Name: N/A    Number of Children: 2  . Years of Education: N/A   Occupational History  . Customer Operations [employer]  Social History Main Topics  . Smoking status: Former Games developer  . Smokeless tobacco: Never Used  . Alcohol Use: No  . Drug Use: No  . Sexually Active:    Other Topics Concern  . None   Social History Narrative  . None     REVIEW OF SYSTEMS - PERTINENT POSITIVES ONLY: Patient notes symptoms occur sporadically over the past several years. Primary symptom is nausea. Gaseous distention and discomfort is centered around the umbilicus. Attacks may last for minutes to days.   EXAM: Filed Vitals:   08/08/11 0939  BP: 136/94  Pulse: 64  Temp: 97.7 F (36.5 C)  Resp: 18    HEENT: normocephalic; pupils equal and reactive; sclerae clear; dentition good; mucous membranes moist NECK:  No palpable masses or nodules; symmetric on extension; no palpable anterior or posterior cervical lymphadenopathy; no supraclavicular masses; no tenderness CHEST: clear to auscultation bilaterally without rales, rhonchi, or  wheezes CARDIAC: regular rate and rhythm without significant murmur; peripheral pulses are full ABDOMEN: Non tender, no distension, no hepatosplenomegaly, no masses, no herniae EXT:  non-tender without edema; no deformity NEURO: no gross focal deficits; no sign of tremor   LABORATORY RESULTS: See E-Chart for most recent results   RADIOLOGY RESULTS: See E-Chart or I-Site for most recent results   IMPRESSION: #1 tiny gallbladder polyps versus cholelithiasis #2 nausea and gaseous abdominal pain of undetermined etiology, consider irritable bowel syndrome #3 post surgical hypothyroidism   PLAN: The patient and I discussed all of the above findings. At this point I do not feel that she has symptomatic gallbladder disease. Given the fact that there may be small polyps in the gallbladder, I think a followup ultrasound is indicated and we will request a followup abdominal ultrasound in one year. Patient will continue followup with her gastroenterologist for medical management. She will return to see me as needed.   Velora Heckler, MD, FACS General & Endocrine Surgery [facility]      Visit Diagnoses: 1. Gallbladder polyp versus tiny stones on ultrasound     Primary Care Physician: Ruthe Mannan, MD, MD  Dr. Mechele Collin, Gastroenterology Amedeo Kinsman, NP

## 2011-08-14 ENCOUNTER — Encounter (INDEPENDENT_AMBULATORY_CARE_PROVIDER_SITE_OTHER): Payer: Self-pay

## 2011-08-14 ENCOUNTER — Encounter: Payer: Self-pay | Admitting: Cardiovascular Disease

## 2011-08-16 LAB — HM PAP SMEAR

## 2011-08-17 ENCOUNTER — Ambulatory Visit (INDEPENDENT_AMBULATORY_CARE_PROVIDER_SITE_OTHER): Payer: 59 | Admitting: Cardiovascular Disease

## 2011-08-17 ENCOUNTER — Encounter: Payer: Self-pay | Admitting: Cardiovascular Disease

## 2011-08-17 VITALS — BP 118/78 | HR 69 | Ht 67.0 in | Wt 157.0 lb

## 2011-08-17 DIAGNOSIS — E785 Hyperlipidemia, unspecified: Secondary | ICD-10-CM

## 2011-08-17 DIAGNOSIS — R002 Palpitations: Secondary | ICD-10-CM

## 2011-08-17 NOTE — Patient Instructions (Signed)
You are doing well. No medication changes were made.  Please call us if you have new issues that need to be addressed before your next appt.  The office will contact you for a follow up Appt. In 12 months  

## 2011-08-17 NOTE — Progress Notes (Signed)
Patient ID: Meghan Mack, female    DOB: 15-Jan-1951, 60 y.o.   MRN: [id number]  HPI Comments: [name] is a very pleasant 60 year old woman with a history of palpitations, remote history of chest pain, hyperlipidemia and hypertension with a strong family history of coronary artery disease who presents for routine followup.      overall, she has been doing well. She takes bystolic 2.5 mg every day. She does continue to have rare episodes of palpitations. She walks on a daily basis and has no symptoms of chest discomfort or tachypalpitations. Rarely she has symptoms of shortness of breath. She has chronic lower extremity edema. He is asking about an over-the-counter diuretic to make her edema better though has not tried this yet. Otherwise she has no new complaints   EKG shows normal sinus rhythm with rate 72 beats per minute with no significant ST or T wave changes      Outpatient Encounter Prescriptions as of 08/17/2011  Medication Sig Dispense Refill  . Cholecalciferol (VITAMIN D) 2000 UNITS CAPS Take 1 capsule by mouth daily.        . diazepam (VALIUM) 5 MG tablet Take 5 mg by mouth 2 (two) times daily as needed.        . lansoprazole (PREVACID) 15 MG capsule Take one to two tablets daily as needed       . levothyroxine (SYNTHROID, LEVOTHROID) 88 MCG tablet Take 88 mcg by mouth daily.        . mometasone (NASONEX) 50 MCG/ACT nasal spray 1 spray by Nasal route daily.        . Multiple Vitamin (MULTIVITAMIN PO) Take 1 tablet by mouth daily.        . nebivolol (BYSTOLIC) 5 MG tablet Take 1 tablet (5 mg total) by mouth daily. Take 1/2 tablet by mouth once a day  90 tablet  3  . Probiotic Product (PROBIOTIC PO) Take 1 tablet by mouth daily.        . ranitidine (ZANTAC) 150 MG tablet Take 150 mg by mouth as needed.        . simvastatin (ZOCOR) 20 MG tablet Take 1 tablet (20 mg total) by mouth daily.  90 tablet  3     Review of Systems  Constitutional: Negative.   HENT: Negative.     Eyes: Negative.   Respiratory: Negative.   Cardiovascular: Negative.   Gastrointestinal: Negative.   Musculoskeletal: Negative.   Skin: Negative.   Neurological: Negative.   Hematological: Negative.   Psychiatric/Behavioral: Negative.   All other systems reviewed and are negative.    BP 118/78  Pulse 69  Ht 5\' 7"  (1.702 m)  Wt 157 lb (71.215 kg)  BMI 24.59 kg/m2  Physical Exam  Nursing note and vitals reviewed. Constitutional: She is oriented to person, place, and time. She appears well-developed and well-nourished.  HENT:  Head: Normocephalic.  Nose: Nose normal.  Mouth/Throat: Oropharynx is clear and moist.  Eyes: Conjunctivae are normal. Pupils are equal, round, and reactive to light.  Neck: Normal range of motion. Neck supple. No JVD present.  Cardiovascular: Normal rate, regular rhythm, S1 normal, S2 normal, normal heart sounds and intact distal pulses.  Exam reveals no gallop and no friction rub.   No murmur heard. Pulmonary/Chest: Effort normal and breath sounds normal. No respiratory distress. She has no wheezes. She has no rales. She exhibits no tenderness.  Abdominal: Soft. Bowel sounds are normal. She exhibits no distension. There is no tenderness.  Musculoskeletal: Normal range of motion. She exhibits no edema and no tenderness.  Lymphadenopathy:    She has no cervical adenopathy.  Neurological: She is alert and oriented to person, place, and time. Coordination normal.  Skin: Skin is warm and dry. No rash noted. No erythema.  Psychiatric: She has a normal mood and affect. Her behavior is normal. Judgment and thought content normal.         Assessment and Plan

## 2011-08-17 NOTE — Assessment & Plan Note (Signed)
Well-controlled with very low dose bystolic. We have suggested she try b.i.d. Dosing 2.5 mg for breakthrough palpitations. Previous Holter has shown APCs and PVCs. We discussed this arrhythmia with her in detail.

## 2011-08-17 NOTE — Assessment & Plan Note (Signed)
I have suggested she continue her statin. She is tolerating this well.

## 2011-08-20 LAB — LIPID PANEL
Cholesterol: 163 mg/dL (ref 0–200)
HDL: 76 mg/dL — AB (ref 35–70)
LDL Cholesterol: 69 mg/dL
Triglycerides: 88 mg/dL (ref 40–160)

## 2011-08-20 LAB — TSH: TSH: 0.54 u[IU]/mL (ref <=5.90)

## 2011-08-20 LAB — BASIC METABOLIC PANEL WITH GFR: Glucose: 86 mg/dL

## 2011-08-21 ENCOUNTER — Encounter: Payer: Self-pay | Admitting: Cardiovascular Disease

## 2011-08-21 ENCOUNTER — Encounter: Payer: Self-pay | Admitting: Family Medicine

## 2011-09-03 ENCOUNTER — Ambulatory Visit (INDEPENDENT_AMBULATORY_CARE_PROVIDER_SITE_OTHER): Payer: 59 | Admitting: Family Medicine

## 2011-09-03 ENCOUNTER — Encounter: Payer: Self-pay | Admitting: Family Medicine

## 2011-09-03 VITALS — BP 140/86 | HR 75 | Temp 98.2°F | Ht 67.5 in | Wt 158.8 lb

## 2011-09-03 DIAGNOSIS — F410 Panic disorder [episodic paroxysmal anxiety] without agoraphobia: Secondary | ICD-10-CM

## 2011-09-03 DIAGNOSIS — Z0000 Encounter for general adult medical examination without abnormal findings: Secondary | ICD-10-CM | POA: Insufficient documentation

## 2011-09-03 DIAGNOSIS — Z1211 Encounter for screening for malignant neoplasm of colon: Secondary | ICD-10-CM

## 2011-09-03 DIAGNOSIS — Z23 Encounter for immunization: Secondary | ICD-10-CM

## 2011-09-03 DIAGNOSIS — R002 Palpitations: Secondary | ICD-10-CM

## 2011-09-03 DIAGNOSIS — E039 Hypothyroidism, unspecified: Secondary | ICD-10-CM

## 2011-09-03 DIAGNOSIS — R300 Dysuria: Secondary | ICD-10-CM

## 2011-09-03 LAB — POCT URINALYSIS DIPSTICK
Bilirubin, UA: NEGATIVE
Blood, UA: NEGATIVE
Glucose, UA: NEGATIVE
Ketones, UA: NEGATIVE
Leukocytes, UA: NEGATIVE
Nitrite, UA: NEGATIVE
Protein, UA: NEGATIVE
Spec Grav, UA: <=1.005
Urobilinogen, UA: 0.2
pH, UA: 6.5

## 2011-09-03 MED ORDER — MECLIZINE HCL 12.5 MG PO TABS: 12.5000 mg | ORAL_TABLET | Freq: Three times a day (TID) | ORAL | Status: AC | PRN

## 2011-09-03 NOTE — Patient Instructions (Signed)
Good to see you. I sent the Meclizine into your pharmacy, you can use it as needed for dizziness or nausea.  Meclizine tablets or capsules What is this medicine? MECLIZINE (MEK li zeen) is an antihistamine. It is used to prevent nausea, vomiting, or dizziness caused by motion sickness. It is also used to prevent and treat vertigo (extreme dizziness or a feeling that you or your surroundings are tilting or spinning around). This medicine may be used for other purposes; ask your health care provider or pharmacist if you have questions. What should I tell my health care provider before I take this medicine? They need to know if you have any of these conditions: -asthma -glaucoma -prostate trouble -stomach problems -urinary problems -an unusual or allergic reaction to meclizine, other medicines, foods, dyes, or preservatives -pregnant or trying to get pregnant -breast-feeding How should I use this medicine? Take this medicine by mouth with a glass of water. Follow the directions on the prescription label. If you are using this medicine to prevent motion sickness, take the dose at least 1 hour before travel. If it upsets your stomach, take it with food or milk. Take your doses at regular intervals. Do not take your medicine more often than directed. Talk to your pediatrician regarding the use of this medicine in children. Special care may be needed. Overdosage: If you think you have taken too much of this medicine contact a poison control center or emergency room at once. NOTE: This medicine is only for you. Do not share this medicine with others. What if I miss a dose? If you miss a dose, take it as soon as you can. If it is almost time for your next dose, take only that dose. Do not take double or extra doses. What may interact with this medicine? -barbiturate medicines for inducing sleep or treating seizures -digoxin -medicines for anxiety or sleeping problems, like alprazolam, diazepam or  temazepam -medicines for hay fever and other allergies -medicines for mental depression -medicines for movement abnormalities as in Parkinson's disease, or for stomach problems -medicines for pain -medicines that relax muscles This list may not describe all possible interactions. Give your health care provider a list of all the medicines, herbs, non-prescription drugs, or dietary supplements you use. Also tell them if you smoke, drink alcohol, or use illegal drugs. Some items may interact with your medicine. What should I watch for while using this medicine? If you are taking this medicine on a regular schedule, visit your doctor or health care professional for regular checks on your progress. You may get dizzy, drowsy or have blurred vision. Do not drive, use machinery, or do anything that needs mental alertness until you know how this medicine affects you. Do not stand or sit up quickly, especially if you are an older patient. This reduces the risk of dizzy or fainting spells. Alcohol can increase possible dizziness. Avoid alcoholic drinks. Your mouth may get dry. Chewing sugarless gum or sucking hard candy, and drinking plenty of water may help. Contact your doctor if the problem does not go away or is severe. This medicine may cause dry eyes and blurred vision. If you wear contact lenses you may feel some discomfort. Lubricating drops may help. See your eye doctor if the problem does not go away or is severe. What side effects may I notice from receiving this medicine? Side effects that you should report to your doctor or health care professional as soon as possible: -fainting spells -fast or irregular  heartbeat Side effects that usually do not require medical attention (report to your doctor or health care professional if they continue or are bothersome): -constipation -difficulty passing urine -difficulty sleeping -headache -stomach upset This list may not describe all possible side  effects. Call your doctor for medical advice about side effects. You may report side effects to FDA at 1-800-FDA-1088. Where should I keep my medicine? Keep out of the reach of children. Store at room temperature between 15 and 30 degrees C (59 and 86 degrees F). Keep container tightly closed. Throw away any unused medicine after the expiration date. NOTE: This sheet is a summary. It may not cover all possible information. If you have questions about this medicine, talk to your doctor, pharmacist, or health care provider.  2012, Elsevier/Gold Standard. (04/08/2008 10:35:36 AM)

## 2011-09-03 NOTE — Progress Notes (Signed)
60 yo here for CPX.  HLD- on Zocor 20 mg daily.  Well controlled without complaints. Lab Results  Component Value Date   CHOL 163 08/20/2011   HDL 76* 08/20/2011   LDLCALC 69 08/20/2011   TRIG 88 08/20/2011    Due for Tdap.  Scheduled for mammogram in december, has GYN.  H/o colon polpys   Urinary frequency- started a few days ago.  No dysuria. She has been drinking more water. Has GYN exam on Monday.  Does have issues with intermittent vertigo, has been pretty stable lately.  Patient Active Problem List  Diagnoses  . HYPERTHYROIDISM  . HYPOTHYROIDISM  . HYPOGLYCEMIA  . VITAMIN D DEFICIENCY  . HYPERLIPIDEMIA  . PANIC ATTACK  . TOBACCO ABUSE  . OBSTRUCTIVE SLEEP APNEA  . SINUSITIS, ACUTE  . FIBROCYSTIC BREAST DISEASE  . SJOGREN'S SYNDROME  . DEGENERATIVE JOINT DISEASE, KNEE  . HIP PAIN, RIGHT  . PAIN IN JOINT, MULTIPLE SITES  . MUSCLE WEAKNESS (GENERALIZED)  . OSTEOPENIA  . MEMORY LOSS  . TACHYCARDIA  . Palpitations  . URINARY URGENCY  . COLONIC POLYPS, HX OF  . Cough  . Dizziness  . Gallbladder polyp versus tiny stones on ultrasound  . Routine general medical examination at a health care facility   Past Medical History  Diagnosis Date  . COPD (chronic obstructive pulmonary disease)   . Colonic polyp   . IBS (irritable bowel syndrome)   . Hyperlipidemia   . Anemia   . Palpitations   . Constipation   . Nausea    Past Surgical History  Procedure Date  . Foot surgery 04/25/2007    right  . Vaginal hysterectomy     ovaries intact  . Esophagogastroduodenoscopy 10/1997 & 08/2005  . Colonoscopy   . Thyroidectomy 11/2005  . Cardiac catheterization 02/25/2007   History  Substance Use Topics  . Smoking status: Former Smoker -- 0.2 packs/day for 10 years    Types: Cigarettes    Quit date: 09/25/1997  . Smokeless tobacco: Never Used  . Alcohol Use: 0.5 oz/week    1 drink(s) per week   Family History  Problem Relation Age of Onset  . Cancer Mother     colon  . Emphysema Father   . Heart disease Father   . Cancer Father     leukemia  . Cancer Brother     lung  . Cancer Maternal Aunt     melanoma  . Cancer Maternal Grandfather     stomach   Allergies  Allergen Reactions  . Codeine   . Esomeprazole Magnesium     REACTION: nausea  . Meperidine Hcl     REACTION: Causes heart to race  . Propantheline Bromide   . Propoxyphene N-Acetaminophen     REACTION: heart race  . Sulfonamide Derivatives    Current Outpatient Prescriptions on File Prior to Visit  Medication Sig Dispense Refill  . Cholecalciferol (VITAMIN D) 2000 UNITS CAPS Take 1 capsule by mouth daily.        . diazepam (VALIUM) 5 MG tablet Take 5 mg by mouth 2 (two) times daily as needed.        . lansoprazole (PREVACID) 15 MG capsule Take one to two tablets daily as needed       . levothyroxine (SYNTHROID, LEVOTHROID) 88 MCG tablet Take 88 mcg by mouth daily.        . mometasone (NASONEX) 50 MCG/ACT nasal spray 1 spray by Nasal route daily.        Marland Kitchen  Multiple Vitamin (MULTIVITAMIN PO) Take 1 tablet by mouth daily.        . nebivolol (BYSTOLIC) 5 MG tablet Take 1 tablet (5 mg total) by mouth daily. Take 1/2 tablet by mouth once a day  90 tablet  3  . Probiotic Product (PROBIOTIC PO) Take 1 tablet by mouth daily.        . ranitidine (ZANTAC) 150 MG tablet Take 150 mg by mouth as needed.        . simvastatin (ZOCOR) 20 MG tablet Take 1 tablet (20 mg total) by mouth daily.  90 tablet  3   The PMH, PSH, Social History, Family History, Medications, and allergies have been reviewed in [facility], and have been updated if relevant.   Review of Systems       See HPI Patient reports no  vision/ hearing changes,anorexia, weight change, fever ,adenopathy, persistant / recurrent hoarseness, swallowing issues, chest pain, edema,persistant / recurrent cough, hemoptysis, dyspnea(rest, exertional, paroxysmal nocturnal), gastrointestinal  bleeding (melena, rectal bleeding), abdominal pain,  excessive heart burn, GU symptoms(dysuria, hematuria, pyuria, voiding/incontinence  Issues) syncope, focal weakness, severe memory loss, concerning skin lesions, depression, anxiety, abnormal bruising/bleeding, major joint swelling, breast masses or abnormal vaginal bleeding.     Physical Exam BP 140/86  Pulse 75  Temp(Src) 98.2 F (36.8 C) (Oral)  Ht 5' 7.5" (1.715 m)  Wt 158 lb 12 oz (72.009 kg)  BMI 24.50 kg/m2 General:  well-appearing woman in no apparent distress, pleasant. Head:  Normocephalic and atraumatic without obvious abnormalities. No apparent alopecia or balding. Eyes:  vision grossly intact, pupils equal, pupils round, and pupils reactive to light.   Ears:  R ear normal and L ear normal.   Nose:  no external deformity.   Mouth:  good dentition.   Neck:  No deformities, masses, or tenderness noted. Lungs:  Normal respiratory effort, chest expands symmetrically. Lungs are clear to auscultation, no crackles or wheezes. Heart:  Normal rate and regular rhythm. S1 and S2 normal without gallop, murmur, click, rub or other extra sounds. Abdomen:  Bowel sounds positive,abdomen soft and non-tender without masses, organomegaly or hernias noted. NO CVA or suprapubic tenderness Msk:  No deformity or scoliosis noted of thoracic or lumbar spine.   Extremities:  no edema Neurologic:  alert & oriented X3 and gait normal.   Skin:  Intact without suspicious lesions or rashes Psych:  Cognition and judgment appear intact. Alert and cooperative with normal attention span and concentration. No apparent delusions, illusions, hallucinations  Assessment and Plan: 1. Routine general medical examination at a health care facility  Reviewed preventive care protocols, scheduled due services, and updated immunizations Discussed nutrition, exercise, diet, and healthy lifestyle.   IFOB  2. HYPOTHYROIDISM  Stable.  Continue current dose of synthroid.   3. Dysuria  UA neg.  Reassurance provided. POCT  urinalysis dipstick  4. Need for Tdap vaccination  Tdap vaccine greater than or equal to 7yo IM

## 2011-09-10 ENCOUNTER — Telehealth: Payer: Self-pay | Admitting: *Deleted

## 2011-09-10 NOTE — Telephone Encounter (Signed)
Noted.  Please add note in her allergy list as an "intolerance to injections" but not a true allergy.

## 2011-09-10 NOTE — Telephone Encounter (Signed)
Tdap listed on allergy list, unable to list "injections" on allergy list must list the type of injection.

## 2011-09-10 NOTE — Telephone Encounter (Signed)
Pt just wanted you to know that after she got her tdap last week she had a local reaction of swelling, which is going down, and now has an itchy rash around the site.  She is going to take benedryl for this.  She says that she always reacts to injections this way and wants this information to be in her chart.

## 2011-09-15 LAB — HM MAMMOGRAPHY

## 2011-09-25 ENCOUNTER — Ambulatory Visit
Admission: RE | Admit: 2011-09-25 | Discharge: 2011-09-25 | Disposition: A | Discharge status: Home / Self Care | Payer: 59 | Source: Ambulatory Visit | Attending: Family Medicine | Admitting: Family Medicine

## 2011-09-25 ENCOUNTER — Ambulatory Visit: Payer: 59

## 2011-09-25 DIAGNOSIS — Z1231 Encounter for screening mammogram for malignant neoplasm of breast: Secondary | ICD-10-CM

## 2011-09-26 ENCOUNTER — Other Ambulatory Visit: Payer: Self-pay | Admitting: Family Medicine

## 2011-09-26 DIAGNOSIS — R928 Other abnormal and inconclusive findings on diagnostic imaging of breast: Secondary | ICD-10-CM

## 2011-10-11 ENCOUNTER — Ambulatory Visit
Admission: RE | Admit: 2011-10-11 | Discharge: 2011-10-11 | Disposition: A | Discharge status: Home / Self Care | Payer: 59 | Source: Ambulatory Visit | Attending: Family Medicine | Admitting: Family Medicine

## 2011-10-11 DIAGNOSIS — R928 Other abnormal and inconclusive findings on diagnostic imaging of breast: Secondary | ICD-10-CM

## 2011-10-15 ENCOUNTER — Telehealth: Payer: Self-pay | Admitting: Radiology

## 2011-10-15 NOTE — Telephone Encounter (Signed)
[facility] notified us that the patient never returned their ifob test, lmom for patient to return my call.

## 2011-10-17 ENCOUNTER — Other Ambulatory Visit: Payer: Self-pay | Admitting: Family Medicine

## 2011-10-17 ENCOUNTER — Other Ambulatory Visit: Payer: 59

## 2011-10-17 ENCOUNTER — Encounter: Payer: Self-pay | Admitting: *Deleted

## 2011-10-17 LAB — FECAL OCCULT BLOOD, IMMUNOCHEMICAL: Fecal Occult Bld: NEGATIVE

## 2011-10-18 ENCOUNTER — Telehealth: Payer: Self-pay

## 2011-10-18 MED ORDER — SIMVASTATIN 20 MG PO TABS: 20.0000 mg | ORAL_TABLET | Freq: Every day | ORAL | Status: DC

## 2011-10-18 MED ORDER — NEBIVOLOL HCL 5 MG PO TABS: 5.0000 mg | ORAL_TABLET | Freq: Every day | ORAL | Status: DC

## 2011-10-18 NOTE — Telephone Encounter (Signed)
Refill sent to [facility] mail [facility].

## 2011-10-24 ENCOUNTER — Telehealth: Payer: Self-pay

## 2011-10-24 NOTE — Telephone Encounter (Signed)
Spoke with Jesusita Oka (pharmacisit) told Rx was sent in on Jan. 3, 2013 but they say did not receive it. A new Rx for the simvastatin and bystolic was called today to [facility] mail order. Patient was notified regarding the pharmacy.

## 2011-10-24 NOTE — Telephone Encounter (Signed)
Results are in

## 2011-11-12 ENCOUNTER — Telehealth: Payer: Self-pay

## 2011-11-12 MED ORDER — SIMVASTATIN 20 MG PO TABS: 20.0000 mg | ORAL_TABLET | Freq: Every day | ORAL | Status: DC

## 2011-11-12 NOTE — Telephone Encounter (Signed)
Refill sent for simvastatin 20 mg one tablet daily for 90 day supply.

## 2012-01-23 ENCOUNTER — Encounter: Payer: Self-pay | Admitting: Family Medicine

## 2012-01-23 ENCOUNTER — Ambulatory Visit (INDEPENDENT_AMBULATORY_CARE_PROVIDER_SITE_OTHER): Payer: 59 | Admitting: Family Medicine

## 2012-01-23 DIAGNOSIS — F4321 Adjustment disorder with depressed mood: Secondary | ICD-10-CM | POA: Insufficient documentation

## 2012-01-23 DIAGNOSIS — H9319 Tinnitus, unspecified ear: Secondary | ICD-10-CM

## 2012-01-23 DIAGNOSIS — F411 Generalized anxiety disorder: Secondary | ICD-10-CM

## 2012-01-23 DIAGNOSIS — H9312 Tinnitus, left ear: Secondary | ICD-10-CM | POA: Insufficient documentation

## 2012-01-23 DIAGNOSIS — F419 Anxiety disorder, unspecified: Secondary | ICD-10-CM | POA: Insufficient documentation

## 2012-01-23 MED ORDER — BUSPIRONE HCL 15 MG PO TABS: 7.5000 mg | ORAL_TABLET | Freq: Two times a day (BID) | ORAL | Status: DC

## 2012-01-23 NOTE — Progress Notes (Signed)
Addended by: Baldomero Lamy on: 01/23/2012 08:10 AM   Modules accepted: Orders

## 2012-01-23 NOTE — Progress Notes (Signed)
Addended by: Dianne Dun on: 01/23/2012 11:33 AM   Modules accepted: Orders

## 2012-01-23 NOTE — Patient Instructions (Signed)
Great to see you. Let's start Buspar 7.5 mg (1/2 tablet twice daily).  Call me in 2 weeks with an update of your symptoms. Please stop by to see marion on your way out to set up your CT scan.

## 2012-01-23 NOTE — Progress Notes (Addendum)
Subjective:    Patient ID: Meghan Mack, female    DOB: July 19, 1951, 61 y.o.   MRN: [id number]  HPI  61 yo here for:  1.  Tinnitus- symptoms for over 2 months.  High pitched ringing, non pulsatile. Went to ENT, Dr. Willeen Cass- per pt, was told he does not know what is causing it. Not taking any new ototoxic medications. Hearing test at ENT per pt was normal.  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  2.  Insomnia- past two months, very anxious at night.  Difficulty sleeping- mind is racing.  New job but she likes it.  Denies feeling depressed. No tearfulness or anhedonia.  No SI or HI.  Patient Active Problem List  Diagnoses  . HYPERTHYROIDISM  . HYPOTHYROIDISM  . HYPOGLYCEMIA  . VITAMIN D DEFICIENCY  . HYPERLIPIDEMIA  . PANIC ATTACK  . TOBACCO ABUSE  . OBSTRUCTIVE SLEEP APNEA  . SINUSITIS, ACUTE  . FIBROCYSTIC BREAST DISEASE  . SJOGREN'S SYNDROME  . DEGENERATIVE JOINT DISEASE, KNEE  . HIP PAIN, RIGHT  . PAIN IN JOINT, MULTIPLE SITES  . MUSCLE WEAKNESS (GENERALIZED)  . OSTEOPENIA  . MEMORY LOSS  . TACHYCARDIA  . Palpitations  . URINARY URGENCY  . COLONIC POLYPS, HX OF  . Cough  . Dizziness  . Gallbladder polyp versus tiny stones on ultrasound  . Routine general medical examination at a health care facility  . Tinnitus   Past Medical History  Diagnosis Date  . COPD (chronic obstructive pulmonary disease)   . Colonic polyp   . IBS (irritable bowel syndrome)   . Hyperlipidemia   . Anemia   . Palpitations   . Constipation   . Nausea    Past Surgical History  Procedure Date  . Foot surgery 04/25/2007    right  . Vaginal hysterectomy     ovaries intact  . Esophagogastroduodenoscopy 10/1997 & 08/2005  . Colonoscopy   . Thyroidectomy 11/2005  . Cardiac catheterization 02/25/2007   History  Substance Use Topics  . Smoking status: Former Smoker -- 0.2 packs/day for 10 years    Types:  Cigarettes    Quit date: 09/25/1997  . Smokeless tobacco: Never Used  . Alcohol Use: 0.5 oz/week    1 drink(s) per week   Family History  Problem Relation Age of Onset  . Cancer Mother     colon  . Emphysema Father   . Heart disease Father   . Cancer Father     leukemia  . Cancer Brother     lung  . Cancer Maternal Aunt     melanoma  . Cancer Maternal Grandfather     stomach   Allergies  Allergen Reactions  . Tdap (Adacel) Swelling and Rash  . Codeine   . Esomeprazole Magnesium     REACTION: nausea  . Meperidine Hcl     REACTION: Causes heart to race  . Propantheline Bromide   . Propoxacet-N     REACTION: heart race  . Sulfonamide Derivatives    Current Outpatient Prescriptions on File Prior to Visit  Medication Sig Dispense Refill  . Cholecalciferol (VITAMIN D) 2000 UNITS CAPS Take 1 capsule by mouth daily.        . diazepam (VALIUM) 5 MG tablet Take 5 mg by mouth 2 (two) times daily as needed.        . lansoprazole (PREVACID) 15 MG capsule Take one to two tablets  daily as needed       . levothyroxine (SYNTHROID, LEVOTHROID) 88 MCG tablet Take 88 mcg by mouth daily. Take 1 tablet 6 days a week and 1/2 tablet on sunday      . meclizine (ANTIVERT) 12.5 MG tablet Take 1 tablet (12.5 mg total) by mouth 3 (three) times daily as needed for dizziness or nausea.  30 tablet  1  . mometasone (NASONEX) 50 MCG/ACT nasal spray 1 spray by Nasal route daily.        . Multiple Vitamin (MULTIVITAMIN PO) Take 1 tablet by mouth daily.        . nebivolol (BYSTOLIC) 5 MG tablet Take 1 tablet (5 mg total) by mouth daily. Take 1/2 tablet by mouth once a day  90 tablet  3  . Probiotic Product (PROBIOTIC PO) Take 1 tablet by mouth daily.        . ranitidine (ZANTAC) 150 MG tablet Take 150 mg by mouth as needed.        . simvastatin (ZOCOR) 20 MG tablet Take 1 tablet (20 mg total) by mouth daily.  90 tablet  3   The PMH, PSH, Social History, Family History, Medications, and allergies have been  reviewed in [facility], and have been updated if relevant.   Review of Systems See HPI   Objective:   Physical Exam BP 150/90  Pulse 97  Temp(Src) 97.7 F (36.5 C) (Oral)  Ht 5' 7.5" (1.715 m)  Wt 157 lb 1.9 oz (71.269 kg)  BMI 24.25 kg/m2  SpO2 98%  General:  Well-developed,well-nourished,in no acute distress; alert,appropriate and cooperative throughout examination Head:  normocephalic and atraumatic.   Eyes:  vision grossly intact, pupils equal, pupils round, and pupils reactive to light.   Ears:  R ear normal and L ear normal.   Nose:  no external deformity.   Mouth:  good dentition.   Neck:  No deformities, masses, or tenderness noted. Psych:  Cognition and judgment appear intact. Anxious Alert and cooperative with normal attention span and concentration. No apparent delusions, illusions, hallucinations     Assessment & Plan:   1. Tinnitus   Deteriorated with unclear etiology- per pt, no etiology per ENT. Will get CT of head with contrast to rule out any pathology not visible on exam. The patient indicates understanding of these issues and agrees with the plan.  CT Head W Contrast, Comprehensive metabolic panel  2. Anxiety  Deteriorated. Start Buspar 7.5 mg twice daily Follow up in 2 weeks.

## 2012-01-24 ENCOUNTER — Other Ambulatory Visit: Payer: 59

## 2012-01-24 LAB — COMPREHENSIVE METABOLIC PANEL
ALT: 28 IU/L (ref 0–32)
AST: 26 IU/L (ref 0–40)
Albumin/Globulin Ratio: 1.6 (ref 1.1–2.5)
Albumin: 4.2 g/dL (ref 3.6–4.8)
Alkaline Phosphatase: 102 IU/L (ref 25–165)
BUN/Creatinine Ratio: 14 (ref 11–26)
BUN: 10 mg/dL (ref 8–27)
CO2: 25 mmol/L (ref 20–32)
Calcium: 9.7 mg/dL (ref 8.6–10.2)
Chloride: 104 mmol/L (ref 97–108)
Creatinine, Ser: 0.72 mg/dL (ref 0.57–1.00)
GFR calc Af Amer: 105 mL/min/{1.73_m2} (ref >59)
GFR calc non Af Amer: 91 mL/min/{1.73_m2} (ref >59)
Globulin, Total: 2.6 g/dL (ref 1.5–4.5)
Glucose: 84 mg/dL (ref 65–99)
Potassium: 4.6 mmol/L (ref 3.5–5.2)
Sodium: 141 mmol/L (ref 134–144)
Total Bilirubin: 0.6 mg/dL (ref 0.0–1.2)
Total Protein: 6.8 g/dL (ref 6.0–8.5)

## 2012-01-25 ENCOUNTER — Other Ambulatory Visit: Payer: Self-pay | Admitting: *Deleted

## 2012-01-25 ENCOUNTER — Other Ambulatory Visit: Payer: Self-pay

## 2012-01-25 MED ORDER — DIAZEPAM 5 MG PO TABS: 5.0000 mg | ORAL_TABLET | Freq: Two times a day (BID) | ORAL | Status: DC | PRN

## 2012-01-25 MED ORDER — MOMETASONE FUROATE 50 MCG/ACT NA SUSP: 1.0000 | Freq: Every day | NASAL | Status: DC

## 2012-01-25 NOTE — Telephone Encounter (Signed)
Faxed request from [facility] [address], no last filled date given.

## 2012-01-25 NOTE — Telephone Encounter (Signed)
Pt seen 01/23/12 and thought Nasonex was being refilled but not at pharmacy. Nasonex #17 g x 3 to [facility] [address]. Pt notified.

## 2012-01-25 NOTE — Telephone Encounter (Signed)
Pt left message needed call back re; refill (did not specify which med). Left v/m for pt to call back.

## 2012-01-25 NOTE — Telephone Encounter (Signed)
Medicine called to pharmacy. 

## 2012-01-30 ENCOUNTER — Telehealth: Payer: Self-pay

## 2012-01-30 MED ORDER — FLUTICASONE PROPIONATE 50 MCG/ACT NA SUSP: 2.0000 | Freq: Every day | NASAL | Status: DC

## 2012-01-30 NOTE — Telephone Encounter (Signed)
Yes ok to replace with generic flonase if pt request.

## 2012-01-30 NOTE — Telephone Encounter (Signed)
[facility] [address] left v/m pt request a generic nasal spray such as flonase instead of Nasonex due to cost.  Unable to reach pt to verify request. Please advise. [facility] be reached at [phone].

## 2012-02-02 ENCOUNTER — Ambulatory Visit
Admission: RE | Admit: 2012-02-02 | Discharge: 2012-02-02 | Disposition: A | Discharge status: Home / Self Care | Payer: 59 | Source: Ambulatory Visit | Attending: Family Medicine | Admitting: Family Medicine

## 2012-05-14 ENCOUNTER — Telehealth: Payer: Self-pay

## 2012-05-14 NOTE — Telephone Encounter (Signed)
Pt left v/m requesting date of last CPX. Spoke with pt last CPX 09/03/11. Pt said that was all she needed.

## 2012-05-16 ENCOUNTER — Other Ambulatory Visit: Payer: Self-pay | Admitting: Obstetrics and Gynecology

## 2012-05-16 ENCOUNTER — Other Ambulatory Visit: Payer: Self-pay | Admitting: Cardiovascular Disease

## 2012-05-16 DIAGNOSIS — Z1231 Encounter for screening mammogram for malignant neoplasm of breast: Secondary | ICD-10-CM

## 2012-05-16 NOTE — Telephone Encounter (Signed)
Patient would like hand written prescription.  She was using mail order and found out it was shipping from [place], would like to have prescription from pharmacy in the [place].  Please call patient when script is written also wants to know if there is a discount card since she will now have to pay for this out of pocket.

## 2012-08-07 ENCOUNTER — Other Ambulatory Visit: Payer: 59

## 2012-08-08 ENCOUNTER — Telehealth (INDEPENDENT_AMBULATORY_CARE_PROVIDER_SITE_OTHER): Payer: Self-pay

## 2012-08-08 ENCOUNTER — Ambulatory Visit
Admission: RE | Admit: 2012-08-08 | Discharge: 2012-08-08 | Disposition: A | Discharge status: Home / Self Care | Payer: 59 | Source: Ambulatory Visit | Attending: Surgery | Admitting: Surgery

## 2012-08-08 ENCOUNTER — Other Ambulatory Visit (INDEPENDENT_AMBULATORY_CARE_PROVIDER_SITE_OTHER): Payer: Self-pay

## 2012-08-08 DIAGNOSIS — K824 Cholesterolosis of gallbladder: Secondary | ICD-10-CM

## 2012-08-08 NOTE — Telephone Encounter (Signed)
Per Dr Ardine Eng request pt advised of U/S result and will need to repeat U/S in one year to follow polyps. Order in epic for U/S 2014.

## 2012-08-11 ENCOUNTER — Other Ambulatory Visit: Payer: Self-pay | Admitting: Internal Medicine

## 2012-08-11 ENCOUNTER — Encounter: Payer: Self-pay | Admitting: Internal Medicine

## 2012-08-11 ENCOUNTER — Ambulatory Visit (INDEPENDENT_AMBULATORY_CARE_PROVIDER_SITE_OTHER): Payer: 59 | Admitting: Internal Medicine

## 2012-08-11 VITALS — BP 140/80 | HR 88 | Temp 97.8°F | Ht 67.5 in | Wt 158.5 lb

## 2012-08-11 DIAGNOSIS — M25569 Pain in unspecified knee: Secondary | ICD-10-CM

## 2012-08-11 DIAGNOSIS — IMO0001 Reserved for inherently not codable concepts without codable children: Secondary | ICD-10-CM

## 2012-08-11 DIAGNOSIS — M25561 Pain in right knee: Secondary | ICD-10-CM

## 2012-08-11 DIAGNOSIS — Z13820 Encounter for screening for osteoporosis: Secondary | ICD-10-CM | POA: Insufficient documentation

## 2012-08-11 DIAGNOSIS — M791 Myalgia, unspecified site: Secondary | ICD-10-CM

## 2012-08-11 DIAGNOSIS — N951 Menopausal and female climacteric states: Secondary | ICD-10-CM

## 2012-08-11 NOTE — Assessment & Plan Note (Signed)
Patient with some diffuse muscle pain most prominent in her thighs and forearms. Question if this may be secondary to use of statin medication. Will have her stop medication for one to 2 weeks to see if any improvement. We'll also send CK level with labs today. Followup one month.

## 2012-08-11 NOTE — Progress Notes (Signed)
Subjective:    Patient ID: Meghan Mack, female    DOB: 08-11-1951, 61 y.o.   MRN: [id number]  HPI 61 year old female with history of hypertension, hypothyroidism, hyperlipidemia presents to establish care. She was previously seen at one of our other offices. Her primary concern today is diffuse myalgia, most prominent in her thighs and forearms. This is been ongoing for approximately 3 months. She denies any new medications or supplements. She denies any fever, chills, change in appetite or weight. She does note some instability in her right knee and sensation that her knee may give out on her. She has not had any falls. She denies any trauma to her knee. Muscle pain and pain in her knee persist throughout the day. They're sometimes worsened with activity. Recently, she had to cut short a hiking trip because she was unable to complete the hike because of pain and weakness in her right leg.  She is also concerned today about hot flashes. These occur on a daily basis. They affect her ability to function at work. She questions whether medication might be helpful. She does not want to try hormone supplementation.  Outpatient Encounter Prescriptions as of 08/11/2012  Medication Sig Dispense Refill  . Cholecalciferol (VITAMIN D) 2000 UNITS CAPS Take 1 capsule by mouth daily.        Marland Kitchen levothyroxine (SYNTHROID, LEVOTHROID) 88 MCG tablet Take 1 tablet 6 days a week and 1/2 tablet on sunday      . Multiple Vitamin (MULTIVITAMIN PO) Take 1 tablet by mouth daily.        . nebivolol (BYSTOLIC) 5 MG tablet Take 2.5 mg tablets to 5mg  daily      . Probiotic Product (PROBIOTIC PO) Take 1 tablet by mouth daily.        . ranitidine (ZANTAC) 150 MG tablet Take 150 mg by mouth as needed.        . simvastatin (ZOCOR) 20 MG tablet Take 1 tablet (20 mg total) by mouth daily.  30 tablet  6  . DISCONTD: nebivolol (BYSTOLIC) 5 MG tablet Take 1 tablet (5 mg total) by mouth daily. Take 1/2 tablet by mouth once a day  90  tablet  3  . busPIRone (BUSPAR) 15 MG tablet Take 0.5 tablets (7.5 mg total) by mouth 2 (two) times daily.  30 tablet  1  . diazepam (VALIUM) 5 MG tablet Take 1 tablet (5 mg total) by mouth 2 (two) times daily as needed.  60 tablet  0  . fluticasone (FLONASE) 50 MCG/ACT nasal spray Place 2 sprays into the nose daily.  16 g  6  . lansoprazole (PREVACID) 15 MG capsule Take one to two tablets daily as needed       . meclizine (ANTIVERT) 12.5 MG tablet Take 1 tablet (12.5 mg total) by mouth 3 (three) times daily as needed for dizziness or nausea.  30 tablet  1    Review of Systems  Constitutional: Positive for diaphoresis. Negative for fever, chills, appetite change and unexpected weight change.  HENT: Negative for ear pain, congestion, sore throat, trouble swallowing, neck pain, voice change and sinus pressure.   Eyes: Negative for visual disturbance.  Respiratory: Negative for cough, shortness of breath, wheezing and stridor.   Cardiovascular: Negative for chest pain, palpitations and leg swelling.  Gastrointestinal: Negative for nausea, vomiting, abdominal pain, diarrhea, constipation, blood in stool, abdominal distention and anal bleeding.  Genitourinary: Negative for dysuria and flank pain.  Musculoskeletal: Positive for myalgias and arthralgias. Negative  for gait problem.  Skin: Negative for color change and rash.  Neurological: Negative for dizziness and headaches.  Hematological: Negative for adenopathy. Does not bruise/bleed easily.  Psychiatric/Behavioral: Negative for suicidal ideas, disturbed wake/sleep cycle and dysphoric mood. The patient is not nervous/anxious.        Objective:   Physical Exam  Constitutional: She is oriented to person, place, and time. She appears well-developed and well-nourished. No distress.  HENT:  Head: Normocephalic and atraumatic.  Right Ear: External ear normal.  Left Ear: External ear normal.  Nose: Nose normal.  Mouth/Throat: Oropharynx is clear  and moist. No oropharyngeal exudate.  Eyes: Conjunctivae normal are normal. Pupils are equal, round, and reactive to light. Right eye exhibits no discharge. Left eye exhibits no discharge. No scleral icterus.  Neck: Normal range of motion. Neck supple. No tracheal deviation present. No thyromegaly present.  Cardiovascular: Normal rate, regular rhythm, normal heart sounds and intact distal pulses.  Exam reveals no gallop and no friction rub.   No murmur heard. Pulmonary/Chest: Effort normal and breath sounds normal. No respiratory distress. She has no wheezes. She has no rales. She exhibits no tenderness.  Musculoskeletal: Normal range of motion. She exhibits no edema and no tenderness.       Right knee: She exhibits normal range of motion, no swelling, no deformity, normal alignment, no LCL laxity and normal patellar mobility. tenderness found. Patellar tendon tenderness noted.  Lymphadenopathy:    She has no cervical adenopathy.  Neurological: She is alert and oriented to person, place, and time. No cranial nerve deficit. She exhibits normal muscle tone. Coordination normal.  Skin: Skin is warm and dry. No rash noted. She is not diaphoretic. No erythema. No pallor.  Psychiatric: She has a normal mood and affect. Her behavior is normal. Judgment and thought content normal.          Assessment & Plan:

## 2012-08-11 NOTE — Assessment & Plan Note (Signed)
Bone density scan ordered today   

## 2012-08-11 NOTE — Assessment & Plan Note (Signed)
Patient with some intermittent hot flashes which interfere with ADLs. We discussed potentially adding SSRI to help with symptoms. She will think about this and call or email if she decides to try this.

## 2012-08-11 NOTE — Assessment & Plan Note (Signed)
Symptoms of right knee pain and exam seem most consistent with either infrapatellar bursitis or meniscal tear, however given persistence of symptoms, will get plain x-ray for initial evaluation. May ultimately need MRI for further eval.

## 2012-08-12 LAB — COMPREHENSIVE METABOLIC PANEL
ALT: 30 IU/L (ref 0–32)
AST: 30 IU/L (ref 0–40)
Albumin/Globulin Ratio: 1.6 (ref 1.1–2.5)
Albumin: 4.2 g/dL (ref 3.6–4.8)
Alkaline Phosphatase: 113 IU/L — ABNORMAL HIGH (ref 47–112)
BUN/Creatinine Ratio: 8 — ABNORMAL LOW (ref 11–26)
BUN: 6 mg/dL — ABNORMAL LOW (ref 8–27)
CO2: 24 mmol/L (ref 19–28)
Calcium: 9.1 mg/dL (ref 8.6–10.2)
Chloride: 101 mmol/L (ref 97–108)
Creatinine, Ser: 0.71 mg/dL (ref 0.57–1.00)
GFR calc Af Amer: 106 mL/min/{1.73_m2} (ref >59)
GFR calc non Af Amer: 92 mL/min/{1.73_m2} (ref >59)
Globulin, Total: 2.7 g/dL (ref 1.5–4.5)
Glucose: 96 mg/dL (ref 65–99)
Potassium: 4.1 mmol/L (ref 3.5–5.2)
Sodium: 142 mmol/L (ref 134–144)
Total Bilirubin: 0.5 mg/dL (ref 0.0–1.2)
Total Protein: 6.9 g/dL (ref 6.0–8.5)

## 2012-08-12 LAB — T4: T4, Total: 10.4 ug/dL (ref 4.5–12.0)

## 2012-08-12 LAB — CBC WITH DIFFERENTIAL
Basophils Absolute: 0 10*3/uL (ref 0.0–0.2)
Basos: 0 % (ref 0–3)
Eos: 1 % (ref 0–5)
Eosinophils Absolute: 0 10*3/uL (ref 0.0–0.4)
HCT: 39.6 % (ref 34.0–46.6)
Hemoglobin: 13.5 g/dL (ref 11.1–15.9)
Immature Grans (Abs): 0 10*3/uL (ref 0.0–0.1)
Immature Granulocytes: 0 % (ref 0–2)
Lymphocytes Absolute: 1 10*3/uL (ref 0.7–3.1)
Lymphs: 29 % (ref 14–46)
MCH: 32.7 pg (ref 26.6–33.0)
MCHC: 34.1 g/dL (ref 31.5–35.7)
MCV: 96 fL (ref 79–97)
Monocytes Absolute: 0.3 10*3/uL (ref 0.1–0.9)
Monocytes: 8 % (ref 4–12)
Neutrophils Absolute: 2.2 10*3/uL (ref 1.4–7.0)
Neutrophils Relative %: 62 % (ref 40–74)
Platelets: 176 10*3/uL (ref 155–379)
RBC: 4.13 x10E6/uL (ref 3.77–5.28)
RDW: 12.9 % (ref 12.3–15.4)
WBC: 3.5 10*3/uL (ref 3.4–10.8)

## 2012-08-12 LAB — LIPID PANEL W/O CHOL/HDL RATIO
Cholesterol, Total: 164 mg/dL (ref 100–199)
HDL: 86 mg/dL (ref >39)
LDL Calculated: 62 mg/dL (ref 0–99)
Triglycerides: 82 mg/dL (ref 0–149)
VLDL Cholesterol Cal: 16 mg/dL (ref 5–40)

## 2012-08-12 LAB — SEDIMENTATION RATE: Sed Rate: 2 mm/hr (ref 0–40)

## 2012-08-12 LAB — B12 AND FOLATE PANEL
Folate: >19.9 ng/mL (ref >3.0)
Vitamin B-12: 709 pg/mL (ref 211–946)

## 2012-08-12 LAB — TSH: TSH: 1.28 u[IU]/mL (ref 0.450–4.500)

## 2012-08-12 LAB — ANA: Anti Nuclear Antibody(ANA): POSITIVE — AB

## 2012-08-12 LAB — RHEUMATOID FACTOR: Rhuematoid fact SerPl-aCnc: 10.6 IU/mL (ref 0.0–13.9)

## 2012-08-12 LAB — VITAMIN D 25 HYDROXY (VIT D DEFICIENCY, FRACTURES): Vit D, 25-Hydroxy: 53.1 ng/mL (ref 30.0–100.0)

## 2012-08-12 LAB — C-REACTIVE PROTEIN: CRP: 0.4 mg/L (ref 0.0–4.9)

## 2012-08-15 ENCOUNTER — Ambulatory Visit (INDEPENDENT_AMBULATORY_CARE_PROVIDER_SITE_OTHER)
Admission: RE | Admit: 2012-08-15 | Discharge: 2012-08-15 | Disposition: A | Discharge status: Home / Self Care | Payer: 59 | Source: Ambulatory Visit | Attending: Internal Medicine | Admitting: Internal Medicine

## 2012-08-15 ENCOUNTER — Telehealth: Payer: Self-pay | Admitting: Internal Medicine

## 2012-08-15 DIAGNOSIS — M25569 Pain in unspecified knee: Secondary | ICD-10-CM

## 2012-08-15 DIAGNOSIS — M25561 Pain in right knee: Secondary | ICD-10-CM

## 2012-08-15 LAB — SPECIMEN STATUS REPORT

## 2012-08-15 NOTE — Telephone Encounter (Signed)
I reviewed bone density report from 08/15/2012. Bone density in the spine was normal. Bone density in the hip showed early weakening, called osteopenia. However, bone density had increased significantly compared with baseline measurement. We can discuss this further at your next visit.

## 2012-08-20 LAB — SPECIMEN STATUS REPORT

## 2012-08-20 LAB — ANTINUCLEAR ANTIBODIES, IFA: ANA Titer 1: NEGATIVE

## 2012-08-21 ENCOUNTER — Encounter: Payer: Self-pay | Admitting: Internal Medicine

## 2012-08-22 ENCOUNTER — Encounter: Payer: Self-pay | Admitting: Internal Medicine

## 2012-08-29 ENCOUNTER — Ambulatory Visit (INDEPENDENT_AMBULATORY_CARE_PROVIDER_SITE_OTHER): Payer: 59 | Admitting: Cardiovascular Disease

## 2012-08-29 ENCOUNTER — Encounter: Payer: Self-pay | Admitting: Cardiovascular Disease

## 2012-08-29 VITALS — BP 134/80 | HR 86 | Ht 68.0 in | Wt 160.8 lb

## 2012-08-29 DIAGNOSIS — E785 Hyperlipidemia, unspecified: Secondary | ICD-10-CM

## 2012-08-29 DIAGNOSIS — R002 Palpitations: Secondary | ICD-10-CM

## 2012-08-29 NOTE — Patient Instructions (Addendum)
You are doing well. No medication changes were made.  Try to download the apps for your phone: "instant heart rate"   "cardiograph"  Please call us if you have new issues that need to be addressed before your next appt.  Your physician wants you to follow-up in: 12 months.  You will receive a reminder letter in the mail two months in advance. If you don't receive a letter, please call our office to schedule the follow-up appointment.

## 2012-08-29 NOTE — Progress Notes (Signed)
Patient ID: Meghan Mack, female    DOB: 1951-06-05, 61 y.o.   MRN: [id number]  HPI Comments: Meghan Mack is a very pleasant 61 year old woman with a history of palpitations, remote history of chest pain, hyperlipidemia and hypertension with a strong family history of coronary artery disease who presents for routine followup.      overall, she has been doing well. She takes bystolic 2.5 mg every day. She does continue to have rare episodes of palpitations. She walks on a daily basis and has no symptoms of chest discomfort or tachypalpitations.Otherwise she has no new complaints. She does have occasional muscle aches and wonders if he could be her cholesterol pill. Symptoms are mild.   EKG shows normal sinus rhythm with rate 86 beats per minute with no significant ST or T wave changes      Outpatient Encounter Prescriptions as of 08/29/2012  Medication Sig Dispense Refill  . diazepam (VALIUM) 5 MG tablet Take 1 tablet (5 mg total) by mouth 2 (two) times daily as needed.  60 tablet  0  . ergocalciferol (VITAMIN D2) 50000 UNITS capsule Take 50,000 Units by mouth every 30 (thirty) days.      . fluticasone (FLONASE) 50 MCG/ACT nasal spray Place 2 sprays into the nose daily.  16 g  6  . lansoprazole (PREVACID) 15 MG capsule Take one to two tablets daily as needed       . levothyroxine (SYNTHROID, LEVOTHROID) 88 MCG tablet Take 1 tablet 6 days a week and 1/2 tablet on sunday      . meclizine (ANTIVERT) 12.5 MG tablet Take 1 tablet (12.5 mg total) by mouth 3 (three) times daily as needed for dizziness or nausea.  30 tablet  1  . Multiple Vitamin (MULTIVITAMIN PO) Take 1 tablet by mouth daily.        . nebivolol (BYSTOLIC) 5 MG tablet Take 2.5 mg tablets to 5mg  daily      . ranitidine (ZANTAC) 150 MG tablet Take 150 mg by mouth as needed.        . simvastatin (ZOCOR) 20 MG tablet Take 1 tablet (20 mg total) by mouth daily.  30 tablet  6     Review of Systems  Constitutional: Negative.   HENT:  Negative.   Eyes: Negative.   Respiratory: Negative.   Cardiovascular: Negative.   Gastrointestinal: Negative.   Musculoskeletal: Negative.   Skin: Negative.   Neurological: Negative.   Hematological: Negative.   Psychiatric/Behavioral: Negative.   All other systems reviewed and are negative.    BP 134/80  Pulse 86  Ht 5\' 8"  (1.727 m)  Wt 160 lb 12 oz (72.916 kg)  BMI 24.44 kg/m2  Physical Exam  Nursing note and vitals reviewed. Constitutional: She is oriented to person, place, and time. She appears well-developed and well-nourished.  HENT:  Head: Normocephalic.  Nose: Nose normal.  Mouth/Throat: Oropharynx is clear and moist.  Eyes: Conjunctivae normal are normal. Pupils are equal, round, and reactive to light.  Neck: Normal range of motion. Neck supple. No JVD present.  Cardiovascular: Normal rate, regular rhythm, S1 normal, S2 normal, normal heart sounds and intact distal pulses.  Exam reveals no gallop and no friction rub.   No murmur heard. Pulmonary/Chest: Effort normal and breath sounds normal. No respiratory distress. She has no wheezes. She has no rales. She exhibits no tenderness.  Abdominal: Soft. Bowel sounds are normal. She exhibits no distension. There is no tenderness.  Musculoskeletal: Normal range of motion.  She exhibits no edema and no tenderness.  Lymphadenopathy:    She has no cervical adenopathy.  Neurological: She is alert and oriented to person, place, and time. Coordination normal.  Skin: Skin is warm and dry. No rash noted. No erythema.  Psychiatric: She has a normal mood and affect. Her behavior is normal. Judgment and thought content normal.         Assessment and Plan

## 2012-08-29 NOTE — Assessment & Plan Note (Addendum)
Cholesterol is at goal on the current lipid regimen. No changes to the medications were made. We did discuss other cholesterol medications. She will just hold her cholesterol medication for several days here and there if she has significant myalgias.

## 2012-08-29 NOTE — Assessment & Plan Note (Signed)
Continues to have rare palpitations. She will take extra beta blocker as needed. We have suggested she try to monitor her rhythm with her Smartt phone. There are several programs that can help her with pulse monitoring. Previous Holter. Given previous APCs and PVCs, do not need to retest.

## 2012-09-02 ENCOUNTER — Telehealth: Payer: Self-pay | Admitting: *Deleted

## 2012-09-02 NOTE — Telephone Encounter (Signed)
Sure, we can start Paxil 10mg  po daily, disp #30 with 6 refills. Pt should have a follow up to see how things are going in 1 month or so.

## 2012-09-02 NOTE — Telephone Encounter (Signed)
Patient called stating that Dr. Dan Humphreys mentioned putting her on a low dose of Paxil for hot flashes.  Please advise.

## 2012-09-03 MED ORDER — PAROXETINE HCL 10 MG PO TABS: 10.0000 mg | ORAL_TABLET | ORAL | Status: DC

## 2012-09-03 NOTE — Telephone Encounter (Signed)
Pt notified and med filled.  

## 2012-09-08 ENCOUNTER — Encounter: Payer: Self-pay | Admitting: Obstetrics and Gynecology

## 2012-09-08 ENCOUNTER — Ambulatory Visit (INDEPENDENT_AMBULATORY_CARE_PROVIDER_SITE_OTHER): Payer: 59 | Admitting: Obstetrics and Gynecology

## 2012-09-08 VITALS — BP 110/62 | HR 80 | Resp 16 | Ht 68.0 in | Wt 160.0 lb

## 2012-09-08 DIAGNOSIS — N898 Other specified noninflammatory disorders of vagina: Secondary | ICD-10-CM

## 2012-09-08 DIAGNOSIS — Z124 Encounter for screening for malignant neoplasm of cervix: Secondary | ICD-10-CM

## 2012-09-08 DIAGNOSIS — Z01419 Encounter for gynecological examination (general) (routine) without abnormal findings: Secondary | ICD-10-CM

## 2012-09-08 DIAGNOSIS — N949 Unspecified condition associated with female genital organs and menstrual cycle: Secondary | ICD-10-CM

## 2012-09-08 LAB — POCT WET PREP (WET MOUNT)
Whiff Test: NEGATIVE
pH: 5.5

## 2012-09-08 NOTE — Progress Notes (Signed)
Regular Periods: no Mammogram: 09/25/2011  Monthly Breast Ex.: no Exercise: yes "Walking 3 x a week.   Tetanus < 10 years: yes Seatbelts: yes  NI. Bladder Functn.: yes Abuse at home: no  Daily BM's: no Stressful Work: no  Healthy Diet: yes Sigmoid-Colonoscopy: 2009 Due 2014 per pt  Calcium: no Medical problems this year: None per pt    LAST PAP:2012 per pt last note states 2011 *[facility]"  Contraception: HYST  Mammogram:  Scheduled 09/2012  PCP: Dr.Walker  PMH: No Changes  FMH: No Changes  Last Bone Scan: pt recently had bone scan in [place] per pt Last of October. She has not  Been made aware of results.

## 2012-09-08 NOTE — Progress Notes (Signed)
Subjective:    Meghan Mack is a 61 y.o. female No obstetric history on file. who presents for annual exam. The patient has no complaints today.     Review of Systems Gastrointestinal:No change in bowel habits, no abdominal pain, no rectal bleeding Genitourinary:negative for abnormal vaginal bleeding,  dysuria, frequency, hematuria, nocturia and urinary incontinence    Objective:     BP 110/62  Pulse 80  Resp 16  Ht 5\' 8"  (1.727 m)  Wt 160 lb (72.576 kg)  BMI 24.33 kg/m2 Weight:  Wt Readings from Last 1 Encounters:  09/08/12 160 lb (72.576 kg)   Body mass index is 24.33 kg/(m^2). General Appearance:  Well nourished in no acute distress HEENT: Grossly normal Neck / Thyroid: Supple, no masses, nodes or enlargement Lungs: Clear to auscultation bilaterally Back: No CVA tenderness Breast Exam: No masses or nodes.No dimpling, nipple retraction or discharge. Cardiovascular: Regular rate and rhythm.  Gastrointestinal: Soft, non-tender, no masses or organomegaly Pelvic Exam: EGBUS-atrophic, vagina-atrophic, cervix/uterus-surgically;  adnexae-no masses or tenderness Rectovaginal: Normal sphincter tone and  no masses  Lymphatic Exam: Non-palpable nodes in neck, clavicular, axillary, or inguinal regions Skin: No rash or abnormalities Extremities: no clubbing cyanosis or edema Neurologic: Grossly normal Psychiatric: Alert and oriented x 3    Wet Prep:  ph-5.5;   whiff-negative; no clue, yeast , trichomoniasis  Assessment:   Routine GYN Exam Vulvovaginal atrophy symptomatic   Plan:   PAP sent-patient request  RTO 1 year or prn  Olive Oil for vaginal atrophy (OTC  lubricants/moisturizers cause burning), doesn't want estrogen  Ashleigh Luckow,ELMIRAPA-C

## 2012-09-13 LAB — PAP IG W/ RFLX HPV ASCU: PAP Smear Comment: 0

## 2012-10-03 ENCOUNTER — Ambulatory Visit
Admission: RE | Admit: 2012-10-03 | Discharge: 2012-10-03 | Disposition: A | Discharge status: Home / Self Care | Payer: 59 | Source: Ambulatory Visit | Attending: Obstetrics and Gynecology | Admitting: Obstetrics and Gynecology

## 2012-10-03 ENCOUNTER — Ambulatory Visit: Payer: 59

## 2012-10-03 DIAGNOSIS — Z1231 Encounter for screening mammogram for malignant neoplasm of breast: Secondary | ICD-10-CM

## 2012-10-03 LAB — HM MAMMOGRAPHY

## 2012-10-06 ENCOUNTER — Ambulatory Visit (INDEPENDENT_AMBULATORY_CARE_PROVIDER_SITE_OTHER): Payer: 59 | Admitting: Internal Medicine

## 2012-10-06 ENCOUNTER — Encounter: Payer: Self-pay | Admitting: Internal Medicine

## 2012-10-06 VITALS — BP 138/90 | HR 83 | Temp 97.9°F | Resp 16 | Ht 68.5 in | Wt 162.5 lb

## 2012-10-06 DIAGNOSIS — M791 Myalgia, unspecified site: Secondary | ICD-10-CM

## 2012-10-06 DIAGNOSIS — Z1331 Encounter for screening for depression: Secondary | ICD-10-CM

## 2012-10-06 DIAGNOSIS — Z0000 Encounter for general adult medical examination without abnormal findings: Secondary | ICD-10-CM | POA: Insufficient documentation

## 2012-10-06 DIAGNOSIS — H9319 Tinnitus, unspecified ear: Secondary | ICD-10-CM

## 2012-10-06 DIAGNOSIS — IMO0001 Reserved for inherently not codable concepts without codable children: Secondary | ICD-10-CM

## 2012-10-06 DIAGNOSIS — H9312 Tinnitus, left ear: Secondary | ICD-10-CM

## 2012-10-06 NOTE — Progress Notes (Signed)
Subjective:    Patient ID: Meghan Mack, female    DOB: 03-05-1951, 61 y.o.   MRN: [id number]  HPI 61YO female with h/o Sjogren's Syndrome, hypertension, hyperlipidemia presents for annual exam. She reports some worsening of chronic pain symptoms with burning pain in her lower legs and arms and some tender points over her upper arms and shoulders.  She does not take any medication for pain. After her last visit, she tried stopping her Simvastatin with no improvement in myalgia.  She also noted once instance when she felt weak in her thighs when trying to climb a flight of stairs.  However, this has not been persistent.  She has not had any falls. She has not had any joint swelling, fever, change in weight or appetite, change in energy level.  She is also concerned about persistent ringing in her left ear.  She has had this for several years.  She has been evaluated by ENT and with MRI brain, which showed stable left sided menigioma, but no acute changes.  She occasionally has high pitched ringing which is new for her. No ear pain, change in hearing.  In regards to general health, she reports she recently underwent Pap smear and mammogram. She follows a healthy diet. She gets regular physical activity by walking. She occasionally has constipation for which she takes Dulcolax.  Outpatient Encounter Prescriptions as of 10/06/2012  Medication Sig Dispense Refill  . ergocalciferol (VITAMIN D2) 50000 UNITS capsule Take 50,000 Units by mouth every 30 (thirty) days.      Marland Kitchen levothyroxine (SYNTHROID, LEVOTHROID) 88 MCG tablet Take 1 tablet 6 days a week and 1/2 tablet on sunday      . Multiple Vitamin (MULTIVITAMIN PO) Take 1 tablet by mouth daily.        . nebivolol (BYSTOLIC) 5 MG tablet Take 2.5 mg tablets to 5mg  daily      . ranitidine (ZANTAC) 150 MG tablet Take 150 mg by mouth as needed.        . simvastatin (ZOCOR) 20 MG tablet Take 1 tablet (20 mg total) by mouth daily.  30 tablet  6  . diazepam  (VALIUM) 5 MG tablet Take 1 tablet (5 mg total) by mouth 2 (two) times daily as needed.  60 tablet  0  . fluticasone (FLONASE) 50 MCG/ACT nasal spray Place 2 sprays into the nose daily.  16 g  6  . lansoprazole (PREVACID) 15 MG capsule Take one to two tablets daily as needed       . PARoxetine (PAXIL) 10 MG tablet Take 1 tablet (10 mg total) by mouth every morning.  30 tablet  6   BP 138/90  Pulse 83  Temp 97.9 F (36.6 C) (Oral)  Resp 16  Ht 5' 8.5" (1.74 m)  Wt 162 lb 8 oz (73.71 kg)  BMI 24.35 kg/m2  SpO2 99%  Review of Systems  Constitutional: Negative for fever, chills, appetite change, fatigue and unexpected weight change.  HENT: Negative for ear pain, congestion, sore throat, trouble swallowing, neck pain, voice change and sinus pressure.   Eyes: Negative for visual disturbance.  Respiratory: Negative for cough, shortness of breath, wheezing and stridor.   Cardiovascular: Negative for chest pain, palpitations and leg swelling.  Gastrointestinal: Negative for nausea, vomiting, abdominal pain, diarrhea, constipation, blood in stool, abdominal distention and anal bleeding.  Genitourinary: Negative for dysuria and flank pain.  Musculoskeletal: Positive for myalgias and arthralgias. Negative for gait problem.  Skin: Negative for color change  and rash.  Neurological: Positive for weakness (occasional thighs). Negative for dizziness and headaches.  Hematological: Negative for adenopathy. Does not bruise/bleed easily.  Psychiatric/Behavioral: Negative for suicidal ideas, sleep disturbance and dysphoric mood. The patient is not nervous/anxious.        Objective:   Physical Exam  Constitutional: She is oriented to person, place, and time. She appears well-developed and well-nourished. No distress.  HENT:  Head: Normocephalic and atraumatic.  Right Ear: External ear normal.  Left Ear: External ear normal.  Nose: Nose normal.  Mouth/Throat: Oropharynx is clear and moist. No  oropharyngeal exudate.  Eyes: Conjunctivae normal are normal. Pupils are equal, round, and reactive to light. Right eye exhibits no discharge. Left eye exhibits no discharge. No scleral icterus.  Neck: Normal range of motion. Neck supple. No tracheal deviation present. No thyromegaly present.  Cardiovascular: Normal rate, regular rhythm, normal heart sounds and intact distal pulses.  Exam reveals no gallop and no friction rub.   No murmur heard. Pulmonary/Chest: Effort normal and breath sounds normal. No respiratory distress. She has no wheezes. She has no rales. She exhibits no tenderness.  Abdominal: Soft. Bowel sounds are normal. She exhibits no distension and no mass. There is no tenderness. There is no rebound and no guarding.  Musculoskeletal: Normal range of motion. She exhibits no edema and no tenderness.       Right upper arm: She exhibits tenderness. She exhibits no deformity.       Left upper arm: She exhibits tenderness. She exhibits no deformity.  Lymphadenopathy:    She has no cervical adenopathy.  Neurological: She is alert and oriented to person, place, and time. No cranial nerve deficit. She exhibits normal muscle tone. Coordination normal.  Skin: Skin is warm and dry. No rash noted. She is not diaphoretic. No erythema. No pallor.  Psychiatric: She has a normal mood and affect. Her behavior is normal. Judgment and thought content normal.          Assessment & Plan:

## 2012-10-06 NOTE — Assessment & Plan Note (Signed)
Symptoms persistent and worsening. Exam is normal. Recent MRI brain was unchanged. Encouraged pt to follow up with ENT. Also discussed using prednisone to help with inflammation, however pt would like to hold off for now.

## 2012-10-06 NOTE — Assessment & Plan Note (Signed)
General medical exam normal today. PAP and breast exam recently performed by her OB/GYN and were normal. Mammogram from 12/20 is pending. Immunizations UTD.  Colonoscopy scheduled for 2014.  Encouraged continued efforts at healthy diet and exercise. Follow up 3 months and prn.

## 2012-10-06 NOTE — Assessment & Plan Note (Signed)
Symptoms of diffuse myalgia with some trigger points suggestive of Fibromyalgia.  However pt also has history of Sjogrens Syndrome, with pos ANA. Will set up rheumatology evaluation. We discussed potentially starting Lyrica today to help with pain symptoms, however pt would prefer not to start any medications at this point.

## 2012-11-05 ENCOUNTER — Other Ambulatory Visit: Payer: Self-pay | Admitting: *Deleted

## 2012-11-05 MED ORDER — SIMVASTATIN 20 MG PO TABS: 20.0000 mg | ORAL_TABLET | Freq: Every day | ORAL | Status: DC

## 2012-11-05 NOTE — Telephone Encounter (Signed)
Refilled Simvastatin. 

## 2012-11-17 ENCOUNTER — Other Ambulatory Visit: Payer: Self-pay | Admitting: *Deleted

## 2012-11-17 MED ORDER — SIMVASTATIN 20 MG PO TABS: 20.0000 mg | ORAL_TABLET | Freq: Every day | ORAL | Status: DC

## 2012-11-17 NOTE — Telephone Encounter (Signed)
Was sent to [facility] instead of [facility]. Pt is almost out of meds

## 2012-11-19 ENCOUNTER — Other Ambulatory Visit: Payer: Self-pay | Admitting: *Deleted

## 2012-11-19 MED ORDER — NEBIVOLOL HCL 5 MG PO TABS: 2.5000 mg | ORAL_TABLET | Freq: Every day | ORAL | Status: DC

## 2012-11-19 NOTE — Telephone Encounter (Signed)
Refilled Bystolic

## 2013-08-10 ENCOUNTER — Telehealth: Payer: Self-pay | Admitting: Internal Medicine

## 2013-08-10 NOTE — Telephone Encounter (Signed)
FYI to Dr. Walker 

## 2013-08-10 NOTE — Telephone Encounter (Signed)
Patient Information:  Caller Name: Aram Beecham  Phone: [phone]  Patient: Meghan, Mack  Gender: Female  DOB: 25-Jun-1951  Age: 63 Years  PCP: Ronna Polio (Adults only)  Office Follow Up:  Does the office need to follow up with this patient?: No  Instructions For The Office: N/A   Symptoms  Reason For Call & Symptoms: Spider bite on right 3rd finger with pain and swelling. Occurred at 1330.  Reviewed Health History In EMR: Yes  Reviewed Medications In EMR: Yes  Reviewed Allergies In EMR: No  Reviewed Surgeries / Procedures: Yes  Date of Onset of Symptoms: 08/10/2013  Treatments Tried: Benadryl.  Ice  Treatments Tried Worked: No  Guideline(s) Used:  Geographical information systems officer  Disposition Per Guideline:   Home Care  Reason For Disposition Reached:   Non-serious spider bite  Advice Given:  Cleaning:   Wash the bite with antibacterial soap and warm water.  Pain Medicines:  For pain relief, you can take either acetaminophen, ibuprofen, or naproxen.  Ibuprofen (e.g., Motrin, Advil):  Take 400 mg (two 200 mg pills) by mouth every 6 hours.  The most you should take each day is 1,200 mg (six 200 mg pills), unless your doctor has told you to take more.  Expected Course:  Some swelling and pain for 1 to 2 days. It shouldn't be any worse than a bee sting.  Call Back If:  Severe bite pain lasts longer than 2 hours after pain medicine  Abdominal pains or muscle spasms occur  Local pain lasts more than 2 days (48 hours)  Bite begins to look infected  You become worse  Patient Will Follow Care Advice:  YES

## 2013-08-12 NOTE — Telephone Encounter (Signed)
Looks a little better just a itchy and in the morning its swollen. She is still taking her Bendaryl and Ibuprofen. She will continue to watch it if not better then she will to let us know.

## 2013-08-12 NOTE — Telephone Encounter (Signed)
Left message to call back, calling to check on how she was doing.

## 2013-08-20 ENCOUNTER — Other Ambulatory Visit: Payer: Self-pay

## 2013-08-31 ENCOUNTER — Other Ambulatory Visit: Payer: Self-pay

## 2013-08-31 ENCOUNTER — Ambulatory Visit (INDEPENDENT_AMBULATORY_CARE_PROVIDER_SITE_OTHER): Payer: 59 | Admitting: Cardiovascular Disease

## 2013-08-31 ENCOUNTER — Telehealth: Payer: Self-pay | Admitting: Internal Medicine

## 2013-08-31 ENCOUNTER — Encounter: Payer: Self-pay | Admitting: Cardiovascular Disease

## 2013-08-31 VITALS — BP 126/84 | HR 83 | Ht 67.5 in | Wt 152.2 lb

## 2013-08-31 DIAGNOSIS — Z1231 Encounter for screening mammogram for malignant neoplasm of breast: Secondary | ICD-10-CM

## 2013-08-31 DIAGNOSIS — I498 Other specified cardiac arrhythmias: Secondary | ICD-10-CM

## 2013-08-31 DIAGNOSIS — R002 Palpitations: Secondary | ICD-10-CM

## 2013-08-31 DIAGNOSIS — R42 Dizziness and giddiness: Secondary | ICD-10-CM

## 2013-08-31 DIAGNOSIS — I4902 Ventricular flutter: Secondary | ICD-10-CM

## 2013-08-31 DIAGNOSIS — E785 Hyperlipidemia, unspecified: Secondary | ICD-10-CM

## 2013-08-31 NOTE — Assessment & Plan Note (Signed)
Etiology of her rhythm is still not clear. No Holter or 30 day monitor available for review. She is symptomatic, has palpitations, tachycardia, shortness of breath with episodes. She is requesting a 30 day monitor. She does not feel that a today monitor will help identify her arrhythmia as she can have stretches without any arrhythmia.

## 2013-08-31 NOTE — Assessment & Plan Note (Signed)
Cholesterol is at goal on the current lipid regimen. No changes to the medications were made.  

## 2013-08-31 NOTE — Patient Instructions (Signed)
You are doing well. No medication changes were made.  We will order a 30 day monitor for tachycardia, palpitations, lightheaded spells    Please call us if you have new issues that need to be addressed before your next appt.  Your physician wants you to follow-up in: 6 months.  You will receive a reminder letter in the mail two months in advance. If you don't receive a letter, please call our office to schedule the follow-up appointment.

## 2013-08-31 NOTE — Progress Notes (Signed)
Patient ID: Meghan Mack, female    DOB: [birth year], 62 y.o.   MRN: [id number]  HPI Comments: Meghan Mack is a very pleasant 62 year old woman with a history of palpitations, remote history of chest pain, hyperlipidemia and hypertension with a strong family history of coronary artery disease who presents for routine followup.      overall, she has been doing well. She takes bystolic 2.5 mg every day. She does continue to have  episodes of palpitations, commonly in the evening 3-4 days per week. It is short-lived, more notable at rest, less with exertion. She is relatively asymptomatic. She has worn a monitor several years ago. These are not available for our review today. His symptoms have persisted, she is requesting a 30 day monitor. She is afraid that the today monitor will not capture her rhythm has some time she has stretches without any arrhythmia.    She walks on a daily basis and has no symptoms of chest discomfort or tachypalpitations.now working out at [employer]   EKG shows normal sinus rhythm with rate 83 beats per minute with nonspecific ST abnormality      Outpatient Encounter Prescriptions as of 08/31/2013  Medication Sig  . acyclovir ointment (ZOVIRAX) 5 % Apply 1 application topically every 3 (three) hours.   . ergocalciferol (VITAMIN D2) 50000 UNITS capsule Take 50,000 Units by mouth every 30 (thirty) days.  . lansoprazole (PREVACID) 15 MG capsule Take one to two tablets daily as needed   . levothyroxine (SYNTHROID, LEVOTHROID) 88 MCG tablet Take 1 tablet 6 days a week and 1/2 tablet on sunday  . Multiple Vitamin (MULTIVITAMIN PO) Take 1 tablet by mouth daily.    . nebivolol (BYSTOLIC) 5 MG tablet Take 2.5 mg tablets to 5mg  daily  . PARoxetine (PAXIL) 10 MG tablet Take 1 tablet (10 mg total) by mouth every morning.  . ranitidine (ZANTAC) 150 MG tablet Take 150 mg by mouth as needed.    . simvastatin (ZOCOR) 20 MG tablet Take 1 tablet (20 mg total) by mouth daily.  .  [DISCONTINUED] nebivolol (BYSTOLIC) 5 MG tablet Take 0.5 tablets (2.5 mg total) by mouth daily. Take 2.5 mg tablets to 5mg  daily  . [DISCONTINUED] diazepam (VALIUM) 5 MG tablet Take 1 tablet (5 mg total) by mouth 2 (two) times daily as needed.  . [DISCONTINUED] fluticasone (FLONASE) 50 MCG/ACT nasal spray Place 2 sprays into the nose daily.     Review of Systems  Constitutional: Negative.   HENT: Negative.   Eyes: Negative.   Respiratory: Negative.   Cardiovascular: Positive for palpitations.       Episodes of tachycardia  Gastrointestinal: Negative.   Endocrine: Negative.   Musculoskeletal: Negative.   Skin: Negative.   Allergic/Immunologic: Negative.   Neurological: Negative.   Hematological: Negative.   Psychiatric/Behavioral: Negative.   All other systems reviewed and are negative.    BP 126/84  Pulse 83  Ht 5' 7.5" (1.715 m)  Wt 152 lb 4 oz (69.06 kg)  BMI 23.48 kg/m2  Physical Exam  Nursing note and vitals reviewed. Constitutional: She is oriented to person, place, and time. She appears well-developed and well-nourished.  HENT:  Head: Normocephalic.  Nose: Nose normal.  Mouth/Throat: Oropharynx is clear and moist.  Eyes: Conjunctivae are normal. Pupils are equal, round, and reactive to light.  Neck: Normal range of motion. Neck supple. No JVD present.  Cardiovascular: Normal rate, regular rhythm, S1 normal, S2 normal, normal heart sounds and intact distal pulses.  Exam reveals no gallop and no friction rub.   No murmur heard. Pulmonary/Chest: Effort normal and breath sounds normal. No respiratory distress. She has no wheezes. She has no rales. She exhibits no tenderness.  Abdominal: Soft. Bowel sounds are normal. She exhibits no distension. There is no tenderness.  Musculoskeletal: Normal range of motion. She exhibits no edema and no tenderness.  Lymphadenopathy:    She has no cervical adenopathy.  Neurological: She is alert and oriented to person, place, and  time. Coordination normal.  Skin: Skin is warm and dry. No rash noted. No erythema.  Psychiatric: She has a normal mood and affect. Her behavior is normal. Judgment and thought content normal.    Assessment and Plan

## 2013-08-31 NOTE — Assessment & Plan Note (Signed)
30 day monitor requested for continued symptoms

## 2013-08-31 NOTE — Telephone Encounter (Signed)
Please remind me and I will fill out [facility] form tomorrow.

## 2013-08-31 NOTE — Telephone Encounter (Signed)
Fwd to Dr. Walker 

## 2013-08-31 NOTE — Assessment & Plan Note (Signed)
Shortness of breath, rare dizziness with arrhythmia. 30 day monitor ordered

## 2013-08-31 NOTE — Telephone Encounter (Signed)
The patient is wanting a script mailed to her for labs . She is a Librarian, academic. Her physical is scheduled on 12.30.14.

## 2013-09-01 DIAGNOSIS — R000 Tachycardia, unspecified: Secondary | ICD-10-CM

## 2013-09-01 DIAGNOSIS — R42 Dizziness and giddiness: Secondary | ICD-10-CM

## 2013-09-03 NOTE — Telephone Encounter (Signed)
Lab form completed to be mailed out today

## 2013-09-05 LAB — LIPID PANEL
Chol/HDL Ratio: 1.9 ratio units (ref 0.0–4.4)
Cholesterol, Total: 154 mg/dL (ref 100–199)
HDL: 81 mg/dL (ref >39)
LDL Calculated: 61 mg/dL (ref 0–99)
Triglycerides: 60 mg/dL (ref 0–149)
VLDL Cholesterol Cal: 12 mg/dL (ref 5–40)

## 2013-09-05 LAB — HEPATIC FUNCTION PANEL
ALT: 24 IU/L (ref 0–32)
AST: 25 IU/L (ref 0–40)
Albumin: 4.2 g/dL (ref 3.6–4.8)
Alkaline Phosphatase: 97 IU/L (ref 39–117)
Bilirubin, Direct: 0.16 mg/dL (ref 0.00–0.40)
Total Bilirubin: 0.6 mg/dL (ref 0.0–1.2)
Total Protein: 6.3 g/dL (ref 6.0–8.5)

## 2013-10-05 ENCOUNTER — Other Ambulatory Visit: Payer: Self-pay | Admitting: Internal Medicine

## 2013-10-05 ENCOUNTER — Ambulatory Visit
Admission: RE | Admit: 2013-10-05 | Discharge: 2013-10-05 | Disposition: A | Discharge status: Home / Self Care | Payer: 59 | Source: Ambulatory Visit

## 2013-10-05 DIAGNOSIS — Z1231 Encounter for screening mammogram for malignant neoplasm of breast: Secondary | ICD-10-CM

## 2013-10-06 LAB — LIPID PANEL W/O CHOL/HDL RATIO
Cholesterol, Total: 166 mg/dL (ref 100–199)
HDL: 79 mg/dL (ref >39)
LDL Calculated: 74 mg/dL (ref 0–99)
Triglycerides: 67 mg/dL (ref 0–149)
VLDL Cholesterol Cal: 13 mg/dL (ref 5–40)

## 2013-10-06 LAB — CBC WITH DIFFERENTIAL
Basophils Absolute: 0 10*3/uL (ref 0.0–0.2)
Basos: 0 %
Eos: 1 %
Eosinophils Absolute: 0.1 10*3/uL (ref 0.0–0.4)
HCT: 38.8 % (ref 34.0–46.6)
Hemoglobin: 13.5 g/dL (ref 11.1–15.9)
Immature Grans (Abs): 0 10*3/uL (ref 0.0–0.1)
Immature Granulocytes: 0 %
Lymphocytes Absolute: 1.2 10*3/uL (ref 0.7–3.1)
Lymphs: 23 %
MCH: 33.1 pg — ABNORMAL HIGH (ref 26.6–33.0)
MCHC: 34.8 g/dL (ref 31.5–35.7)
MCV: 95 fL (ref 79–97)
Monocytes Absolute: 0.3 10*3/uL (ref 0.1–0.9)
Monocytes: 7 %
Neutrophils Absolute: 3.6 10*3/uL (ref 1.4–7.0)
Neutrophils Relative %: 69 %
Platelets: 187 10*3/uL (ref 150–379)
RBC: 4.08 x10E6/uL (ref 3.77–5.28)
RDW: 12.6 % (ref 12.3–15.4)
WBC: 5.2 10*3/uL (ref 3.4–10.8)

## 2013-10-06 LAB — COMPREHENSIVE METABOLIC PANEL
ALT: 20 IU/L (ref 0–32)
AST: 25 IU/L (ref 0–40)
Albumin/Globulin Ratio: 2.3 (ref 1.1–2.5)
Albumin: 4.5 g/dL (ref 3.6–4.8)
Alkaline Phosphatase: 93 IU/L (ref 39–117)
BUN/Creatinine Ratio: 13 (ref 11–26)
BUN: 9 mg/dL (ref 8–27)
CO2: 26 mmol/L (ref 18–29)
Calcium: 9.2 mg/dL (ref 8.6–10.2)
Chloride: 100 mmol/L (ref 97–108)
Creatinine, Ser: 0.7 mg/dL (ref 0.57–1.00)
GFR calc Af Amer: 107 mL/min/{1.73_m2} (ref >59)
GFR calc non Af Amer: 93 mL/min/{1.73_m2} (ref >59)
Globulin, Total: 2 g/dL (ref 1.5–4.5)
Glucose: 86 mg/dL (ref 65–99)
Potassium: 4 mmol/L (ref 3.5–5.2)
Sodium: 141 mmol/L (ref 134–144)
Total Bilirubin: 0.6 mg/dL (ref 0.0–1.2)
Total Protein: 6.5 g/dL (ref 6.0–8.5)

## 2013-10-06 LAB — TSH: TSH: 1.35 u[IU]/mL (ref 0.450–4.500)

## 2013-10-06 LAB — VITAMIN D 25 HYDROXY (VIT D DEFICIENCY, FRACTURES): Vit D, 25-Hydroxy: 49.7 ng/mL (ref 30.0–100.0)

## 2013-10-06 LAB — HGB A1C W/O EAG: Hgb A1c MFr Bld: 5.5 % (ref 4.8–5.6)

## 2013-10-13 ENCOUNTER — Encounter: Payer: 59 | Admitting: Internal Medicine

## 2013-10-14 ENCOUNTER — Telehealth: Payer: Self-pay

## 2013-10-14 NOTE — Telephone Encounter (Signed)
Pt would like monitor results. 

## 2013-10-14 NOTE — Telephone Encounter (Signed)
Patient would like results and she wanted to make sure that Dr. Mariah Milling looked at 12/15. That is when she felt the most activity.

## 2013-10-15 NOTE — Telephone Encounter (Signed)
3 very short episodes of atrial tach 3 beats, 4 beats and 8 beats (8 beat  on 09/28/13) Not much arrhythmia overall Some apcs Would continue currents meds

## 2013-10-16 ENCOUNTER — Encounter: Payer: Self-pay | Admitting: Cardiovascular Disease

## 2013-10-16 ENCOUNTER — Ambulatory Visit (INDEPENDENT_AMBULATORY_CARE_PROVIDER_SITE_OTHER): Payer: 59

## 2013-10-16 DIAGNOSIS — R000 Tachycardia, unspecified: Secondary | ICD-10-CM

## 2013-10-16 NOTE — Telephone Encounter (Signed)
Reviewed results w/ pt.  She reports that she felt like she was going to pass out on 12/15. Reports she addressed this symptom at last ov and was told that it may be her bp dropping, but she did not check this during episode. States that she will continue her current med regimen and call w/ any other concerns. She states that she will be scheduling a colonoscopy in the near future and will have clearance request faxed to our office.

## 2013-10-16 NOTE — Telephone Encounter (Signed)
Left message for pt to call back  °

## 2013-12-18 ENCOUNTER — Other Ambulatory Visit: Payer: Self-pay | Admitting: Internal Medicine

## 2013-12-18 ENCOUNTER — Telehealth: Payer: Self-pay | Admitting: Emergency Medicine

## 2013-12-18 ENCOUNTER — Telehealth: Payer: Self-pay

## 2013-12-18 MED ORDER — PAROXETINE HCL 10 MG PO TABS: 10.0000 mg | ORAL_TABLET | ORAL | Status: DC

## 2013-12-18 NOTE — Telephone Encounter (Signed)
We can call in 7 days on Paxil, then must have follow up here.

## 2013-12-18 NOTE — Telephone Encounter (Signed)
Spoke w/ pt.  She reports that her BP normally runs low to normal, but yesterday it was 147/81 HR 69, 150/80 HR 123. Pt admits that she has quite a bit of anxiety and was feeling anxious during these readings. Advised pt to monitor BP over the weekend and call Monday w/ readings, in the meantime, to contact her PCP and talk about anxiety issues. Pt reports that she was previously given rx for Paxil, but she has not been taking this. She will call the office on Monday to update me on her status.

## 2013-12-18 NOTE — Telephone Encounter (Signed)
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  Called cell number, automated system said the number was no longer working.  Attempted to leave a message on home vm but the machine disconnected.   Sent in 7 days of Paxil, Pt will need follow up appt for further medication

## 2013-12-18 NOTE — Telephone Encounter (Signed)
Pt states for the last couple days her BP has been 150/80 pulse 123, yesterday it was 147/81 pulse 69.

## 2013-12-18 NOTE — Telephone Encounter (Signed)
Pt has not been seen in over a year, she had a scheduled appt with you in Dec '14, but cancelled.  Please advise

## 2013-12-18 NOTE — Telephone Encounter (Signed)
The patient is wanting a refill on her Paxil, the script has expired. [facility] [address].

## 2013-12-21 ENCOUNTER — Telehealth: Payer: Self-pay

## 2013-12-21 NOTE — Telephone Encounter (Signed)
Spoke w/ pt.  She reports that her highest BP over the weekend was 142/96 and the lowest 130/82. She states that she has been so anxious this am that she has not checked it today. Reports that Dr. Gilford Rile sent her rx to a different drug store, so she was unable to pick it up yet. States that the pharmacist told her that there is  an interaction b/t paxil & bystolic, in that the paxil will increase the effect of bystolic. She states that she is pretty sure Dr. Rockey Situ told her it would be okay to take them together, but she wanted to make sure.  If it is ok to take them both, she would like to know who far apart to take them and if she should continue on 2.5mg  w/ option of 5mg  if BP is elevated.  Please advise.  Thank you.

## 2013-12-21 NOTE — Telephone Encounter (Signed)
This encounter was created in error - please disregard.

## 2013-12-22 NOTE — Telephone Encounter (Signed)
Ok to take both. Doses are very low Would monitor BP as she goes along

## 2013-12-23 ENCOUNTER — Ambulatory Visit (INDEPENDENT_AMBULATORY_CARE_PROVIDER_SITE_OTHER): Payer: 59 | Admitting: Adult Health

## 2013-12-23 ENCOUNTER — Telehealth: Payer: Self-pay | Admitting: Internal Medicine

## 2013-12-23 ENCOUNTER — Encounter: Payer: Self-pay | Admitting: Adult Health

## 2013-12-23 VITALS — BP 122/78 | HR 100 | Temp 98.0°F | Resp 14 | Wt 151.0 lb

## 2013-12-23 DIAGNOSIS — R300 Dysuria: Secondary | ICD-10-CM

## 2013-12-23 DIAGNOSIS — K6289 Other specified diseases of anus and rectum: Secondary | ICD-10-CM

## 2013-12-23 DIAGNOSIS — R208 Other disturbances of skin sensation: Secondary | ICD-10-CM | POA: Insufficient documentation

## 2013-12-23 LAB — POCT URINALYSIS DIPSTICK
Bilirubin, UA: NEGATIVE
Blood, UA: NEGATIVE
Glucose, UA: NEGATIVE
Ketones, UA: NEGATIVE
Leukocytes, UA: NEGATIVE
Nitrite, UA: NEGATIVE
Protein, UA: NEGATIVE
Spec Grav, UA: 1.01
Urobilinogen, UA: 0.2
pH, UA: 6.5

## 2013-12-23 MED ORDER — NYSTATIN 100000 UNIT/GM EX CREA: 1.0000 "application " | TOPICAL_CREAM | Freq: Two times a day (BID) | CUTANEOUS | Status: DC

## 2013-12-23 MED ORDER — PAROXETINE HCL 10 MG PO TABS: 10.0000 mg | ORAL_TABLET | ORAL | Status: DC

## 2013-12-23 NOTE — Telephone Encounter (Signed)
Is there a reason only 7 pills were sent in for patient? Is her medication changing?

## 2013-12-23 NOTE — Telephone Encounter (Signed)
Spoke w/ pt.  Advised her of Dr. Donivan Scull recommendation. She verbalizes understanding and is agreeable to plan.

## 2013-12-23 NOTE — Telephone Encounter (Signed)
Yes, I was trying to call in a 1 week supply until she can make an appointment. She has not been seen since 2013

## 2013-12-23 NOTE — Telephone Encounter (Signed)
Spoke with patient, informed her we would send in 30 day supply only to local pharmacy because she has not been seen since 09/2012. Patient verbalized understanding. While on the phone patient mentioned she has been having some urinary tract issues and would like to be seen today. Patient confirmed appointment for today with Raquel for 130. If culture is needed on patient or any other labs please send to [facility]

## 2013-12-23 NOTE — Progress Notes (Signed)
Pre visit review using our clinic review tool, if applicable. No additional management support is needed unless otherwise documented below in the visit note. 

## 2013-12-23 NOTE — Telephone Encounter (Signed)
The patient is wanting to know why only seven pills of her PARoxetine (PAXIL) 10 MG tablet  were called into her mail order. She is requesting that the rest of her prescription to be [facility] into  [facility].

## 2013-12-23 NOTE — Progress Notes (Signed)
Patient ID: Meghan Mack, female   DOB: 08-05-1951, 63 y.o.   MRN: [id number]    Subjective:    Patient ID: Meghan Mack, female    DOB: 04/09/1951, 63 y.o.   MRN: [id number]  HPI  Pt is a 63 y/o female who presents to clinic with dysuria. She has sensitivity around her rectum. She has looked with a mirror and reports that it is raw. Hx of herpes and is wondering if this could be an outbreak. Reports that her rectum burns when urine comes in contact with skin.   Past Medical History  Diagnosis Date  . COPD (chronic obstructive pulmonary disease)   . Colonic polyp   . IBS (irritable bowel syndrome)   . Hyperlipidemia   . Anemia   . Palpitations   . Constipation   . Nausea     Current Outpatient Prescriptions on File Prior to Visit  Medication Sig Dispense Refill  . acyclovir ointment (ZOVIRAX) 5 % Apply 1 application topically every 3 (three) hours.       . ergocalciferol (VITAMIN D2) 50000 UNITS capsule Take 50,000 Units by mouth every 30 (thirty) days.      . lansoprazole (PREVACID) 15 MG capsule Take one to two tablets daily as needed       . levothyroxine (SYNTHROID, LEVOTHROID) 88 MCG tablet Take 1 tablet 6 days a week and 1/2 tablet on sunday      . Multiple Vitamin (MULTIVITAMIN PO) Take 1 tablet by mouth daily.        . nebivolol (BYSTOLIC) 5 MG tablet Take 2.5 mg tablets to 5mg  daily      . PARoxetine (PAXIL) 10 MG tablet Take 1 tablet (10 mg total) by mouth every morning.  30 tablet  0  . ranitidine (ZANTAC) 150 MG tablet Take 150 mg by mouth as needed.        . simvastatin (ZOCOR) 20 MG tablet Take 1 tablet (20 mg total) by mouth daily.  90 tablet  3   No current facility-administered medications on file prior to visit.     Review of Systems  Constitutional: Negative for fever and chills.  Gastrointestinal: Positive for rectal pain.       Burning around rectum  Genitourinary: Negative for frequency, hematuria, flank pain and difficulty urinating.    Psychiatric/Behavioral: Negative.   All other systems reviewed and are negative.       Objective:  BP 122/78  Pulse 100  Temp(Src) 98 F (36.7 C) (Oral)  Wt 151 lb (68.493 kg)  SpO2 99%   Physical Exam  Constitutional: She is oriented to person, place, and time. She appears well-developed and well-nourished. No distress.  Cardiovascular: Normal rate and regular rhythm.   Pulmonary/Chest: Effort normal. No respiratory distress.  Genitourinary:  Erythema around rectum. No herpetic lesions noted. UA without any evidence of urinary tract infection  Musculoskeletal: Normal range of motion.  Neurological: She is alert and oriented to person, place, and time.  Skin: No rash noted. There is erythema.  Psychiatric: She has a normal mood and affect. Her behavior is normal. Judgment and thought content normal.          Assessment & Plan:   1. Dysuria UA does not show evidence of UTI. I will send for culture to confirm given her symptoms. - POCT urinalysis dipstick - Urine culture  2. Rectal burning Skin surrounding rectum with yeast - it is raw, erythematous. No herpetic lesions noted. Nystatin cream bid to area.  RTC if no improvement within 5 days or sooner if necessary.

## 2013-12-24 ENCOUNTER — Telehealth (INDEPENDENT_AMBULATORY_CARE_PROVIDER_SITE_OTHER): Payer: Self-pay

## 2013-12-24 NOTE — Telephone Encounter (Signed)
Pt called stating she had GB u/s in 2013 that showed gallbladder polyps. She states she was to have it repeated in 2014. Pt advised I see the old order in system for abd u/s to follow gallbladder polyp but there is no notation or exam note in system as far back as 2012. I advised pt we can order the ultrasound but since we have not seen her in 3 or more years she will need an office visit also.  Pt states she is to see her PCP PA next week. The pt will ask the PA to review her 2013 ultrasound and order a repeat ultrasound to see if the gallbladder polyps have changed. I advised pt that if the PA or her PCP will not order u/s to call our office and we can set up u/s and office visit. Pt advised this note will be visible to her PCP and PA thru epic.  Pt states she understands.

## 2013-12-27 ENCOUNTER — Encounter: Payer: Self-pay | Admitting: Adult Health

## 2013-12-27 LAB — URINE CULTURE: Colony Count: 15000

## 2013-12-28 ENCOUNTER — Encounter: Payer: Self-pay | Admitting: Adult Health

## 2013-12-28 ENCOUNTER — Ambulatory Visit (INDEPENDENT_AMBULATORY_CARE_PROVIDER_SITE_OTHER): Payer: 59 | Admitting: Adult Health

## 2013-12-28 VITALS — BP 128/72 | HR 100 | Resp 16 | Wt 150.5 lb

## 2013-12-28 DIAGNOSIS — K6289 Other specified diseases of anus and rectum: Secondary | ICD-10-CM | POA: Insufficient documentation

## 2013-12-28 DIAGNOSIS — E039 Hypothyroidism, unspecified: Secondary | ICD-10-CM

## 2013-12-28 DIAGNOSIS — F411 Generalized anxiety disorder: Secondary | ICD-10-CM

## 2013-12-28 MED ORDER — BUSPIRONE HCL 7.5 MG PO TABS: 7.5000 mg | ORAL_TABLET | Freq: Two times a day (BID) | ORAL | Status: DC | PRN

## 2013-12-28 MED ORDER — AMOXICILLIN-POT CLAVULANATE 875-125 MG PO TABS: 1.0000 | ORAL_TABLET | Freq: Two times a day (BID) | ORAL | Status: DC

## 2013-12-28 NOTE — Progress Notes (Signed)
Pre visit review using our clinic review tool, if applicable. No additional management support is needed unless otherwise documented below in the visit note. 

## 2013-12-28 NOTE — Progress Notes (Signed)
Patient ID: Meghan Mack, female   DOB: [birth year], 63 y.o.   MRN: [id number]    Subjective:    Patient ID: Meghan Mack, female    DOB: Apr 13, 1951, 63 y.o.   MRN: [id number]  HPI  There is a pleasant 63 year old female who presents to clinic for followup of rectal irritation. She also would like to discuss anxiety issues. I saw her in clinic approximately one week ago and started her on nystatin for irritation of the skin surrounding the rectum. She reports that the area is still raw. She also reports that her GYN started her on Augmentin 1 and this happened to her one other time. She is requesting a prescription for same. She is not having any dysuria. She denies bleeding from her rectum.  She reports that she is on Paxil and bystolic. She has not started the Paxil because the pharmacist advised that there was a potential reaction between the two. She would like to discuss this. She also reports that she would like something to take prn. She has been on valium in the past. Pt is wondering if her thyroid could be contributing to her symptoms. She has a f/u appt with endocrine in June.   Past Medical History  Diagnosis Date  . COPD (chronic obstructive pulmonary disease)   . Colonic polyp   . IBS (irritable bowel syndrome)   . Hyperlipidemia   . Anemia   . Palpitations   . Constipation   . Nausea     Current Outpatient Prescriptions on File Prior to Visit  Medication Sig Dispense Refill  . acyclovir ointment (ZOVIRAX) 5 % Apply 1 application topically every 3 (three) hours.       . ergocalciferol (VITAMIN D2) 50000 UNITS capsule Take 50,000 Units by mouth every 30 (thirty) days.      . lansoprazole (PREVACID) 15 MG capsule Take one to two tablets daily as needed       . levothyroxine (SYNTHROID, LEVOTHROID) 88 MCG tablet Take 1 tablet 6 days a week and 1/2 tablet on sunday      . Multiple Vitamin (MULTIVITAMIN PO) Take 1 tablet by mouth daily.        . nebivolol (BYSTOLIC) 5 MG  tablet Take 2.5 mg tablets to 5mg  daily      . nystatin cream (MYCOSTATIN) Apply 1 application topically 2 (two) times daily.  30 g  0  . ranitidine (ZANTAC) 150 MG tablet Take 150 mg by mouth as needed.        . simvastatin (ZOCOR) 20 MG tablet Take 1 tablet (20 mg total) by mouth daily.  90 tablet  3  . PARoxetine (PAXIL) 10 MG tablet Take 1 tablet (10 mg total) by mouth every morning.  30 tablet  0   No current facility-administered medications on file prior to visit.     Review of Systems  Constitutional: Negative.   HENT: Negative.   Eyes: Negative.   Respiratory: Negative.   Cardiovascular: Negative.   Gastrointestinal: Negative.        Irritation of skin surrounding rectum  Endocrine: Negative.   Genitourinary: Negative.   Musculoskeletal: Negative.   Skin: Negative.   Allergic/Immunologic: Negative.   Neurological: Negative.   Hematological: Negative.   Psychiatric/Behavioral: Positive for sleep disturbance. The patient is nervous/anxious.        Objective:  BP 128/72  Pulse 100  Resp 16  Wt 150 lb 8 oz (68.266 kg)  SpO2 95%   Physical Exam  Constitutional: She is oriented to person, place, and time. She appears well-developed and well-nourished. No distress.  HENT:  Head: Normocephalic and atraumatic.  Nose: Nose normal.  Eyes: Conjunctivae and EOM are normal.  Neck: Normal range of motion. Neck supple. No tracheal deviation present.  Cardiovascular: Normal rate, regular rhythm, normal heart sounds and intact distal pulses.  Exam reveals no gallop and no friction rub.   No murmur heard. Pulmonary/Chest: Effort normal and breath sounds normal. No respiratory distress. She has no wheezes. She has no rales.  Musculoskeletal: Normal range of motion.  Neurological: She is alert and oriented to person, place, and time.  Skin: Skin is warm and dry.  Psychiatric: She has a normal mood and affect. Her behavior is normal. Judgment and thought content normal.         Assessment & Plan:   1. Anxiety state, unspecified Discussed use of paxil with bystolic. She is on the lowest dose of both. She needs to make sure that her blood pressure does not drop too low (<100/60) or that her pulse drops below 60. Her pulse today was 100. I suspect that pt will tolerate this dose without any problems.   - TSH  2. Hypothyroid She take levothyroxine. She has been taking this with other medication. Will check TSH to make sure therapeutic. - TSH  3. Rectal irritation Augmentin bid x 7 days. She is to advise if symptoms not resolved.

## 2014-01-06 ENCOUNTER — Other Ambulatory Visit: Payer: Self-pay | Admitting: Neurosurgery

## 2014-01-06 ENCOUNTER — Telehealth: Payer: Self-pay | Admitting: Internal Medicine

## 2014-01-06 DIAGNOSIS — D320 Benign neoplasm of cerebral meninges: Secondary | ICD-10-CM

## 2014-01-06 NOTE — Telephone Encounter (Signed)
Pt states Dr. Gala Lewandowsky office sent a note to Dr. Gilford Rile to have f/u scan for polyps in gallbladder ordered for pt at [facility].  States it was due in October.  Asking if Dr. Gilford Rile and order and schedule this.  States Dr. Gala Lewandowsky office usually schedules but pt was told by their office that Dr. Gilford Rile should schedule it this time since she has been seen here recently by the PA.  States if we schedule it, the pt will not have to have an office visit with Dr. Harlow Asa.

## 2014-01-07 NOTE — Telephone Encounter (Signed)
Pt needs to schedule a follow up visit to discuss.

## 2014-01-07 NOTE — Telephone Encounter (Signed)
Please read below and advise.

## 2014-01-07 NOTE — Telephone Encounter (Signed)
Left detailed message on patient voicemail to call office and schedule an appointment to discuss.

## 2014-01-09 ENCOUNTER — Other Ambulatory Visit: Payer: Self-pay | Admitting: Cardiovascular Disease

## 2014-01-11 ENCOUNTER — Encounter: Payer: Self-pay | Admitting: Adult Health

## 2014-01-11 ENCOUNTER — Other Ambulatory Visit: Payer: Self-pay | Admitting: *Deleted

## 2014-01-11 ENCOUNTER — Ambulatory Visit (INDEPENDENT_AMBULATORY_CARE_PROVIDER_SITE_OTHER): Payer: 59 | Admitting: Adult Health

## 2014-01-11 VITALS — BP 128/74 | HR 118 | Temp 98.2°F | Resp 14 | Wt 150.0 lb

## 2014-01-11 DIAGNOSIS — R300 Dysuria: Secondary | ICD-10-CM

## 2014-01-11 DIAGNOSIS — J029 Acute pharyngitis, unspecified: Secondary | ICD-10-CM | POA: Insufficient documentation

## 2014-01-11 LAB — POCT URINALYSIS DIPSTICK
Bilirubin, UA: NEGATIVE
Blood, UA: NEGATIVE
Glucose, UA: NEGATIVE
Ketones, UA: NEGATIVE
Leukocytes, UA: NEGATIVE
Nitrite, UA: NEGATIVE
Protein, UA: 30
Spec Grav, UA: 1.015
Urobilinogen, UA: 0.2
pH, UA: 5.5

## 2014-01-11 LAB — POCT RAPID STREP A (OFFICE): Rapid Strep A Screen: NEGATIVE

## 2014-01-11 MED ORDER — NEBIVOLOL HCL 5 MG PO TABS: ORAL_TABLET | ORAL | Status: DC

## 2014-01-11 MED ORDER — AZITHROMYCIN 250 MG PO TABS: ORAL_TABLET | ORAL | Status: DC

## 2014-01-11 NOTE — Telephone Encounter (Signed)
Requested Prescriptions   Signed Prescriptions Disp Refills  . nebivolol (BYSTOLIC) 5 MG tablet 90 tablet 3    Sig: Take 2.5 mg tablets to 5mg  daily    Authorizing Provider: Minna Merritts    Ordering User: Britt Bottom

## 2014-01-11 NOTE — Progress Notes (Signed)
Pre visit review using our clinic review tool, if applicable. No additional management support is needed unless otherwise documented below in the visit note. 

## 2014-01-11 NOTE — Patient Instructions (Signed)
  Start Azithromycin 2 tablets today then 1 tablet daily for the next 4 days.  Continue to gargle with salt water solution or listerine  Tylenol for general discomfort, fever or pain  Chloraseptic spray or lozenges for sore throat.  Please call if symptoms are not improved within 4-5 days or sooner if necessary.

## 2014-01-11 NOTE — Progress Notes (Signed)
   Subjective:    Patient ID: Meghan Mack, female    DOB: 1951-04-04, 63 y.o.   MRN: [id number]  HPI Pt is a 63 y/o female who presents to clinic with sore throat. Symptoms started 3 days ago. She reports that she has been gargling. Has not taken any other medication for her throat. She also would like to repeat the urinalysis to see if there is any infection. She was recently treated for symptoms of UTI; however, culture came back with insignificant growth. Pt is leaving on vacation this weekend.   Current Outpatient Prescriptions on File Prior to Visit  Medication Sig Dispense Refill  . acyclovir ointment (ZOVIRAX) 5 % Apply 1 application topically every 3 (three) hours.       . busPIRone (BUSPAR) 7.5 MG tablet Take 1 tablet (7.5 mg total) by mouth 2 (two) times daily as needed.  30 tablet  3  . ergocalciferol (VITAMIN D2) 50000 UNITS capsule Take 50,000 Units by mouth every 30 (thirty) days.      . lansoprazole (PREVACID) 15 MG capsule Take one to two tablets daily as needed       . levothyroxine (SYNTHROID, LEVOTHROID) 88 MCG tablet Take 1 tablet 6 days a week and 1/2 tablet on sunday      . Multiple Vitamin (MULTIVITAMIN PO) Take 1 tablet by mouth daily.        . nebivolol (BYSTOLIC) 5 MG tablet Take 2.5 mg tablets to 5mg  daily      . nystatin cream (MYCOSTATIN) Apply 1 application topically 2 (two) times daily.  30 g  0  . PARoxetine (PAXIL) 10 MG tablet Take 1 tablet (10 mg total) by mouth every morning.  30 tablet  0  . ranitidine (ZANTAC) 150 MG tablet Take 150 mg by mouth as needed.        . simvastatin (ZOCOR) 20 MG tablet Take 1 tablet (20 mg total) by mouth daily.  90 tablet  3   No current facility-administered medications on file prior to visit.    Review of Systems  Constitutional: Negative for fever and chills.  HENT: Positive for sore throat.   All other systems reviewed and are negative.       Objective:   Physical Exam  Constitutional: She is oriented to  person, place, and time. She appears well-developed and well-nourished. No distress.  HENT:  Head: Normocephalic and atraumatic.  Mouth/Throat: Oropharyngeal exudate present.  Pharyngeal drainage noted.  Pulmonary/Chest: Effort normal. No respiratory distress.  Musculoskeletal: Normal range of motion.  Neurological: She is alert and oriented to person, place, and time.  Psychiatric: She has a normal mood and affect. Her behavior is normal. Judgment and thought content normal.       Assessment & Plan:   1. Sore throat Rapid strep was negative. She does have exudate present and reports considerable discomfort. Treating empirically for strep with Azithromycin.  2. Dysuria UA without any signs of infection - POCT Urinalysis Dipstick

## 2014-01-13 ENCOUNTER — Ambulatory Visit: Payer: 59 | Admitting: Internal Medicine

## 2014-01-18 ENCOUNTER — Telehealth: Payer: Self-pay | Admitting: Internal Medicine

## 2014-01-18 ENCOUNTER — Encounter: Payer: Self-pay | Admitting: Adult Health

## 2014-01-18 NOTE — Telephone Encounter (Signed)
Pt is out of town in [place].  States she has come here several times for UTI recently.  States she is still having symptoms and will not be back to town until Thursday.  States her most recent urinalysis came back negative for protein and then a protein of 30; she feels that this is a change though she was told there was no change.  States she usually goes to [facility] and there is one nearby in [place].  Asking if the lab could be sent/called over. Did not have details for [facility].

## 2014-01-18 NOTE — Telephone Encounter (Signed)
Spoke with patient to discuss her concerns. Her kidney function has been normal. Dipstick UA is preliminary test and when abnormal results we usually repeat. She is currently in [place]. Will fax lab request to [facility] in area for her to have urinalysis and bmet checked.

## 2014-01-18 NOTE — Telephone Encounter (Signed)
Patient left a message this morning on voicemail in reference to the below about her UA. Could you please help explain why she now has protein in her urine.

## 2014-01-20 ENCOUNTER — Telehealth: Payer: Self-pay | Admitting: *Deleted

## 2014-01-20 NOTE — Telephone Encounter (Signed)
Advised pt of normal results from [facility]

## 2014-01-21 ENCOUNTER — Ambulatory Visit: Payer: 59 | Admitting: Internal Medicine

## 2014-01-24 ENCOUNTER — Ambulatory Visit
Admission: RE | Admit: 2014-01-24 | Discharge: 2014-01-24 | Disposition: A | Discharge status: Home / Self Care | Payer: 59 | Source: Ambulatory Visit | Attending: Neurosurgery | Admitting: Neurosurgery

## 2014-01-24 DIAGNOSIS — D320 Benign neoplasm of cerebral meninges: Secondary | ICD-10-CM

## 2014-01-24 MED ORDER — GADOBENATE DIMEGLUMINE 529 MG/ML IV SOLN
14.0000 mL | Freq: Once | INTRAVENOUS | Status: AC | PRN
  Administered 2014-01-24: 14 mL via INTRAVENOUS

## 2014-02-03 ENCOUNTER — Ambulatory Visit: Payer: 59 | Admitting: Internal Medicine

## 2014-02-18 ENCOUNTER — Telehealth (INDEPENDENT_AMBULATORY_CARE_PROVIDER_SITE_OTHER): Payer: Self-pay

## 2014-02-18 NOTE — Telephone Encounter (Signed)
I have followed in epic to see if pt would be having her GB u/s with her PCP. Pt was seen by Dr Harlow Asa in 2013 and was to have repeat u/s in one year due to GB polyp.  Since no order in epic and no notation from pcp that this has been done I will forward request to Dr Harlow Asa for ok to place order in epic for GB u/s and follow up with pt.

## 2014-02-24 ENCOUNTER — Encounter: Payer: Self-pay | Admitting: Adult Health

## 2014-03-01 ENCOUNTER — Ambulatory Visit: Payer: 59 | Admitting: Cardiovascular Disease

## 2014-03-04 ENCOUNTER — Encounter: Payer: Self-pay | Admitting: Cardiovascular Disease

## 2014-03-04 ENCOUNTER — Ambulatory Visit (INDEPENDENT_AMBULATORY_CARE_PROVIDER_SITE_OTHER): Payer: 59 | Admitting: Cardiovascular Disease

## 2014-03-04 VITALS — BP 140/90 | HR 93 | Ht 68.0 in | Wt 147.5 lb

## 2014-03-04 DIAGNOSIS — R000 Tachycardia, unspecified: Secondary | ICD-10-CM

## 2014-03-04 DIAGNOSIS — F419 Anxiety disorder, unspecified: Secondary | ICD-10-CM

## 2014-03-04 DIAGNOSIS — F411 Generalized anxiety disorder: Secondary | ICD-10-CM

## 2014-03-04 DIAGNOSIS — R002 Palpitations: Secondary | ICD-10-CM

## 2014-03-04 NOTE — Assessment & Plan Note (Signed)
Or palpitations, she could take extra low-dose beta blocker. Overall she seems to be doing well

## 2014-03-04 NOTE — Progress Notes (Signed)
Patient ID: MARENE GILLIAM, female    DOB: [birth year], 63 y.o.   MRN: [id number]  HPI Comments: Ms. Cuthbert is a very pleasant 63 year old woman with a history of palpitations, anxiety, remote history of chest pain, hyperlipidemia and hypertension with a strong family history of coronary artery disease who presents for routine followup.      overall, she has been doing well. She takes bystolic 2.5 mg every day. She does have occasional palpitations. In she is concerned about her anxiety. She was prescribed Paxil but has not started this yet as she was told by the pharmacy there was an interaction with her beta blocker. She feels that she might need the medication as she does have anxiety She is tolerating her simvastatin   She walks on a daily basis and has no symptoms of chest discomfort or tachypalpitations.  EKG shows normal sinus rhythm with rate 93 beats per minute with nonspecific ST abnormality      Outpatient Encounter Prescriptions as of 03/04/2014  Medication Sig  . acyclovir ointment (ZOVIRAX) 5 % Apply 1 application topically every 3 (three) hours.   . ergocalciferol (VITAMIN D2) 50000 UNITS capsule Take 50,000 Units by mouth every 30 (thirty) days.  . lansoprazole (PREVACID) 15 MG capsule Take one to two tablets daily as needed   . levothyroxine (SYNTHROID, LEVOTHROID) 88 MCG tablet Take 1 tablet 6 days a week and 1/2 tablet on sunday  . Multiple Vitamin (MULTIVITAMIN PO) Take 1 tablet by mouth daily.    . nebivolol (BYSTOLIC) 5 MG tablet Take 2.5 mg tablets to 5mg  daily  . ranitidine (ZANTAC) 150 MG tablet Take 150 mg by mouth as needed.    . simvastatin (ZOCOR) 20 MG tablet Take 1 tablet (20 mg total) by mouth daily.  Marland Kitchen PARoxetine (PAXIL) 10 MG tablet Take 1 tablet (10 mg total) by mouth every morning.   Review of Systems  Constitutional: Negative.   HENT: Negative.   Eyes: Negative.   Respiratory: Negative.   Cardiovascular: Positive for palpitations.   Gastrointestinal: Negative.   Endocrine: Negative.   Musculoskeletal: Negative.   Skin: Negative.   Allergic/Immunologic: Negative.   Neurological: Negative.   Hematological: Negative.   Psychiatric/Behavioral: Negative.   All other systems reviewed and are negative.   BP 140/90  Pulse 93  Ht 5\' 8"  (1.727 m)  Wt 147 lb 8 oz (66.906 kg)  BMI 22.43 kg/m2  Physical Exam  Nursing note and vitals reviewed. Constitutional: She is oriented to person, place, and time. She appears well-developed and well-nourished.  HENT:  Head: Normocephalic.  Nose: Nose normal.  Mouth/Throat: Oropharynx is clear and moist.  Eyes: Conjunctivae are normal. Pupils are equal, round, and reactive to light.  Neck: Normal range of motion. Neck supple. No JVD present.  Cardiovascular: Normal rate, regular rhythm, S1 normal, S2 normal, normal heart sounds and intact distal pulses.  Exam reveals no gallop and no friction rub.   No murmur heard. Pulmonary/Chest: Effort normal and breath sounds normal. No respiratory distress. She has no wheezes. She has no rales. She exhibits no tenderness.  Abdominal: Soft. Bowel sounds are normal. She exhibits no distension. There is no tenderness.  Musculoskeletal: Normal range of motion. She exhibits no edema and no tenderness.  Lymphadenopathy:    She has no cervical adenopathy.  Neurological: She is alert and oriented to person, place, and time. Coordination normal.  Skin: Skin is warm and dry. No rash noted. No erythema.  Psychiatric: She has a  normal mood and affect. Her behavior is normal. Judgment and thought content normal.    Assessment and Plan

## 2014-03-04 NOTE — Patient Instructions (Signed)
You are doing well. No medication changes were made.  Please call us if you have new issues that need to be addressed before your next appt.  Your physician wants you to follow-up in: 12 months.  You will receive a reminder letter in the mail two months in advance. If you don't receive a letter, please call our office to schedule the follow-up appointment. 

## 2014-03-04 NOTE — Assessment & Plan Note (Signed)
Episodes of tachycardia much improved on low-dose beta blocker. Suggested she could increase the dose up to 5 mg a day as needed for take extra 2.5 mg when necessary

## 2014-03-04 NOTE — Assessment & Plan Note (Signed)
Suggested she start the Paxil as prescribed. Given she is on low-dose beta blocker and Paxil his low-dose, I do not think it will be a major interaction as suggested by the pharmacist

## 2014-03-17 ENCOUNTER — Other Ambulatory Visit (INDEPENDENT_AMBULATORY_CARE_PROVIDER_SITE_OTHER): Payer: Self-pay

## 2014-03-17 DIAGNOSIS — K824 Cholesterolosis of gallbladder: Secondary | ICD-10-CM

## 2014-03-17 NOTE — Telephone Encounter (Signed)
Cindy,  Let's order a new abdominal ultrasound to evaluate history of gallbladder polyp.  After study completed, I would like to see in the office to discuss.  tmg  Earnstine Regal, MD, [facility] Office: [phone]

## 2014-03-17 NOTE — Telephone Encounter (Signed)
Order for GB ultrasound in epic and given to referral coord to set up appt and call pt.

## 2014-03-18 ENCOUNTER — Telehealth (INDEPENDENT_AMBULATORY_CARE_PROVIDER_SITE_OTHER): Payer: Self-pay | Admitting: *Deleted

## 2014-03-18 NOTE — Telephone Encounter (Signed)
Spoke to pt, made her aware of her appt for US Abdomen 03-22-14 arriving at 7:45 for a 8:00 appt at [facility].   Pt agreed and showed understanding.  Anderson Malta

## 2014-03-22 ENCOUNTER — Ambulatory Visit
Admission: RE | Admit: 2014-03-22 | Discharge: 2014-03-22 | Disposition: A | Discharge status: Home / Self Care | Payer: 59 | Source: Ambulatory Visit | Attending: Surgery | Admitting: Surgery

## 2014-03-22 DIAGNOSIS — K824 Cholesterolosis of gallbladder: Secondary | ICD-10-CM

## 2014-03-22 NOTE — Progress Notes (Signed)
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  No need for office visit or further surgical follow up.  Continue regular follow up with primary care physician.  Earnstine Regal, MD, [facility] Office: [phone]    ______

## 2014-04-12 ENCOUNTER — Ambulatory Visit: Payer: Self-pay | Admitting: Unknown Physician Specialty

## 2014-04-12 LAB — HM COLONOSCOPY

## 2014-04-15 LAB — PATHOLOGY REPORT

## 2014-04-26 ENCOUNTER — Encounter: Payer: Self-pay | Admitting: *Deleted

## 2014-05-03 ENCOUNTER — Encounter: Payer: Self-pay | Admitting: Internal Medicine

## 2014-06-07 ENCOUNTER — Telehealth: Payer: Self-pay

## 2014-06-07 DIAGNOSIS — Z780 Asymptomatic menopausal state: Secondary | ICD-10-CM

## 2014-06-07 NOTE — Telephone Encounter (Signed)
The patient called and is hoping to get an order placed for a bone density test.  Her last was November 2013.

## 2014-08-10 ENCOUNTER — Other Ambulatory Visit: Payer: Self-pay

## 2014-08-10 DIAGNOSIS — Z1239 Encounter for other screening for malignant neoplasm of breast: Secondary | ICD-10-CM

## 2014-08-26 ENCOUNTER — Other Ambulatory Visit: Payer: Self-pay | Admitting: *Deleted

## 2014-08-26 MED ORDER — SIMVASTATIN 20 MG PO TABS: ORAL_TABLET | ORAL | Status: DC

## 2014-09-02 LAB — HM PAP SMEAR

## 2014-09-14 ENCOUNTER — Ambulatory Visit: Payer: 59

## 2014-09-14 ENCOUNTER — Ambulatory Visit
Admission: RE | Admit: 2014-09-14 | Discharge: 2014-09-14 | Disposition: A | Discharge status: Home / Self Care | Payer: BC Managed Care – PPO | Source: Ambulatory Visit

## 2014-09-14 ENCOUNTER — Other Ambulatory Visit: Payer: Self-pay

## 2014-09-14 DIAGNOSIS — Z1231 Encounter for screening mammogram for malignant neoplasm of breast: Secondary | ICD-10-CM

## 2014-09-15 ENCOUNTER — Other Ambulatory Visit: Payer: Self-pay | Admitting: Obstetrics and Gynecology

## 2014-09-15 DIAGNOSIS — R928 Other abnormal and inconclusive findings on diagnostic imaging of breast: Secondary | ICD-10-CM

## 2014-09-16 ENCOUNTER — Telehealth: Payer: Self-pay

## 2014-09-16 NOTE — Telephone Encounter (Signed)
Left message for pt to return call.

## 2014-09-16 NOTE — Telephone Encounter (Signed)
The patient called and wants to discuss her immunizations.

## 2014-09-17 NOTE — Telephone Encounter (Signed)
Left a message to return call.  

## 2014-09-17 NOTE — Telephone Encounter (Signed)
Left message for pt to return call.

## 2014-09-20 NOTE — Telephone Encounter (Signed)
Left message again for pt to return call.  Closing encounter until pt returns call

## 2014-09-30 ENCOUNTER — Ambulatory Visit
Admission: RE | Admit: 2014-09-30 | Discharge: 2014-09-30 | Disposition: A | Discharge status: Home / Self Care | Payer: BC Managed Care – PPO | Source: Ambulatory Visit | Attending: Obstetrics and Gynecology | Admitting: Obstetrics and Gynecology

## 2014-09-30 DIAGNOSIS — R928 Other abnormal and inconclusive findings on diagnostic imaging of breast: Secondary | ICD-10-CM

## 2014-10-02 LAB — HM MAMMOGRAPHY: HM Mammogram: NORMAL

## 2014-10-18 ENCOUNTER — Telehealth: Payer: Self-pay

## 2014-10-18 NOTE — Telephone Encounter (Signed)
Can she come at 1pm today?

## 2014-10-18 NOTE — Telephone Encounter (Signed)
The patient stated she could not come in today, but could come in tomorrow.  I went ahead and added her in the 11:30am time slot.  If you want her moved, let me know, and i'll be happy to move her wherever you would like.   (the schedule already had a 1pm and 1:30pm for tomorrow)

## 2014-10-18 NOTE — Telephone Encounter (Signed)
The patient called and is complaining of redness, soreness, and pain in her left breast (near the nipple area, around 6 oclock)   Do you want her worked in, if so where?   Callback - [phone]

## 2014-10-19 ENCOUNTER — Ambulatory Visit (INDEPENDENT_AMBULATORY_CARE_PROVIDER_SITE_OTHER): Payer: 59 | Admitting: Internal Medicine

## 2014-10-19 ENCOUNTER — Telehealth: Payer: Self-pay | Admitting: Internal Medicine

## 2014-10-19 ENCOUNTER — Other Ambulatory Visit: Payer: Self-pay | Admitting: Internal Medicine

## 2014-10-19 ENCOUNTER — Encounter: Payer: Self-pay | Admitting: Internal Medicine

## 2014-10-19 VITALS — BP 139/85 | HR 113 | Temp 98.1°F | Ht 68.5 in | Wt 156.2 lb

## 2014-10-19 DIAGNOSIS — N63 Unspecified lump in unspecified breast: Secondary | ICD-10-CM

## 2014-10-19 DIAGNOSIS — H109 Unspecified conjunctivitis: Secondary | ICD-10-CM

## 2014-10-19 MED ORDER — POLYMYXIN B-TRIMETHOPRIM 10000-0.1 UNIT/ML-% OP SOLN: 2.0000 [drp] | OPHTHALMIC | Status: DC

## 2014-10-19 NOTE — Telephone Encounter (Signed)
12noon on Tuesday

## 2014-10-19 NOTE — Assessment & Plan Note (Signed)
Symptoms and exam consistent with recurrent bacterial conjunctivitis. Will start topical Polytrim. She will call if symptoms are not improving.

## 2014-10-19 NOTE — Assessment & Plan Note (Signed)
Her description of initial induration and nodular area are most consistent with cellulitis or subareolar abscess. This seems to be improving, however small nodular area still palpable. Will get breast US for further evaluation. Reviewed recent mammogram which was normal. Follow up recheck next week. Consider surgical referral for drainage or biopsy depending on [place] finding.

## 2014-10-19 NOTE — Telephone Encounter (Signed)
Patient was advised to make a one week follow up appt from today, but the doctor does not have any open appointment slots.  Please advise as to where you want the patient placed on the schedule.

## 2014-10-19 NOTE — Patient Instructions (Signed)
We will set up ultrasound of your left breast for further evaluation.  Start Polytrim eye drops 1-2 drops every 4 hours while awake. Follow up if symptoms are not improving.

## 2014-10-19 NOTE — Telephone Encounter (Signed)
Meghan Mack is found in our system to send her back this message. Please call and schedule appt

## 2014-10-19 NOTE — Progress Notes (Signed)
Subjective:    Patient ID: Meghan Mack, female    DOB: 09-Feb-1951, 64 y.o.   MRN: [id number]  HPI 64YO female presents for acute visit.  Last Saturday, 3 days ago, noted redness and itching around left nipple with nodular area that was tender. Area seems to be getting better. No fever. No drainage. No trauma to breast. Last mammogram was 09/2014, normal on left, right breast had additional views which were normal. No previous h/o biopsy.  Also concerned about redness and drainage from right eye over last 2 days. Has history of conjunctivitis in the past on multiple occasions, treated by her ophthalmologist with topical antibiotic. No visual changes.   Past medical, surgical, family and social history per today's encounter.  Review of Systems  Constitutional: Negative for fever, chills, appetite change, fatigue and unexpected weight change.  Eyes: Positive for discharge and redness. Negative for photophobia, pain, itching and visual disturbance.  Respiratory: Negative for shortness of breath.   Cardiovascular: Negative for chest pain and leg swelling.  Gastrointestinal: Negative for abdominal pain.  Skin: Positive for color change. Negative for rash.  Hematological: Negative for adenopathy. Does not bruise/bleed easily.  Psychiatric/Behavioral: Negative for dysphoric mood. The patient is not nervous/anxious.        Objective:    BP 139/85 mmHg  Pulse 113  Temp(Src) 98.1 F (36.7 C) (Oral)  Ht 5' 8.5" (1.74 m)  Wt 156 lb 4 oz (70.875 kg)  BMI 23.41 kg/m2  SpO2 100% Physical Exam  Constitutional: She is oriented to person, place, and time. She appears well-developed and well-nourished. No distress.  HENT:  Head: Normocephalic and atraumatic.  Right Ear: External ear normal.  Left Ear: External ear normal.  Nose: Nose normal.  Mouth/Throat: Oropharynx is clear and moist.  Eyes: Pupils are equal, round, and reactive to light. Right eye exhibits discharge and exudate. Left  eye exhibits no discharge. Right conjunctiva is injected. No scleral icterus.  Neck: Normal range of motion. Neck supple. No tracheal deviation present. No thyromegaly present.  Pulmonary/Chest: Effort normal. No accessory muscle usage. No tachypnea. She has no decreased breath sounds. She has no rhonchi. Left breast exhibits mass. Left breast exhibits no inverted nipple, no nipple discharge, no skin change and no tenderness.    Musculoskeletal: Normal range of motion. She exhibits no edema or tenderness.  Lymphadenopathy:    She has no cervical adenopathy.  Neurological: She is alert and oriented to person, place, and time. No cranial nerve deficit. She exhibits normal muscle tone. Coordination normal.  Skin: Skin is warm and dry. No rash noted. She is not diaphoretic. No erythema. No pallor.  Psychiatric: She has a normal mood and affect. Her behavior is normal. Judgment and thought content normal.          Assessment & Plan:   Problem List Items Addressed This Visit      Unprioritized   Breast nodule - Primary    Her description of initial induration and nodular area are most consistent with cellulitis or subareolar abscess. This seems to be improving, however small nodular area still palpable. Will get breast US for further evaluation. Reviewed recent mammogram which was normal. Follow up recheck next week. Consider surgical referral for drainage or biopsy depending on [place] finding.    Relevant Orders      US BREAST COMPLETE UNI LEFT INC AXILLA   Conjunctivitis of right eye    Symptoms and exam consistent with recurrent bacterial conjunctivitis. Will start topical  Polytrim. She will call if symptoms are not improving.    Relevant Medications      trimethoprim-polymyxin b (POLYTRIM) ophthalmic solution       Return in about 1 week (around 10/26/2014) for Recheck.

## 2014-10-19 NOTE — Telephone Encounter (Signed)
Appointment was made on 10/26/14 at 12:00noon per doctor's instructions.  Patient was notified of the appointment day and time.

## 2014-10-19 NOTE — Progress Notes (Signed)
Pre visit review using our clinic review tool, if applicable. No additional management support is needed unless otherwise documented below in the visit note. 

## 2014-10-26 ENCOUNTER — Ambulatory Visit: Payer: 59 | Admitting: Internal Medicine

## 2014-10-26 ENCOUNTER — Telehealth: Payer: Self-pay | Admitting: Internal Medicine

## 2014-10-26 ENCOUNTER — Ambulatory Visit
Admission: RE | Admit: 2014-10-26 | Discharge: 2014-10-26 | Disposition: A | Discharge status: Home / Self Care | Payer: 59 | Source: Ambulatory Visit | Attending: Internal Medicine | Admitting: Internal Medicine

## 2014-10-26 DIAGNOSIS — N63 Unspecified lump in unspecified breast: Secondary | ICD-10-CM

## 2014-10-26 NOTE — Telephone Encounter (Signed)
Yes, she needs to keep appointment

## 2014-10-26 NOTE — Telephone Encounter (Signed)
Patient received a message on mychart that her mammo was ok, so she wants to know if she still needs to come in to discuss the results with the doctor.

## 2014-10-26 NOTE — Telephone Encounter (Signed)
Notified pt. 

## 2014-11-02 ENCOUNTER — Ambulatory Visit (INDEPENDENT_AMBULATORY_CARE_PROVIDER_SITE_OTHER): Payer: 59 | Admitting: Internal Medicine

## 2014-11-02 ENCOUNTER — Encounter: Payer: Self-pay | Admitting: Internal Medicine

## 2014-11-02 VITALS — BP 128/83 | HR 117 | Temp 98.1°F | Ht 68.5 in | Wt 155.5 lb

## 2014-11-02 DIAGNOSIS — N63 Unspecified lump in unspecified breast: Secondary | ICD-10-CM

## 2014-11-02 NOTE — Progress Notes (Signed)
   Subjective:    Patient ID: Meghan Mack, female    DOB: 1950-12-23, 64 y.o.   MRN: [id number]  HPI 64YO female presents to follow up breast mass.  Seen 10/19/2014 with left breast nodular area. Mammogram completed and was normal. Nodular area in left breast has resolved. No pain, redness at site.  No new concerns.  Past medical, surgical, family and social history per today's encounter.  Review of Systems  Constitutional: Negative for fever, chills, appetite change, fatigue and unexpected weight change.  Eyes: Negative for visual disturbance.  Respiratory: Negative for shortness of breath.   Cardiovascular: Negative for chest pain and leg swelling.  Gastrointestinal: Negative for abdominal pain.  Skin: Negative for color change and rash.  Hematological: Negative for adenopathy. Does not bruise/bleed easily.  Psychiatric/Behavioral: Negative for dysphoric mood. The patient is not nervous/anxious.        Objective:    BP 128/83 mmHg  Pulse 117  Temp(Src) 98.1 F (36.7 C) (Oral)  Ht 5' 8.5" (1.74 m)  Wt 155 lb 8 oz (70.534 kg)  BMI 23.30 kg/m2  SpO2 100% Physical Exam  Constitutional: She is oriented to person, place, and time. She appears well-developed and well-nourished. No distress.  HENT:  Head: Normocephalic and atraumatic.  Right Ear: External ear normal.  Left Ear: External ear normal.  Nose: Nose normal.  Mouth/Throat: Oropharynx is clear and moist. No oropharyngeal exudate.  Eyes: Conjunctivae are normal. Pupils are equal, round, and reactive to light. Right eye exhibits no discharge. Left eye exhibits no discharge. No scleral icterus.  Neck: Normal range of motion. Neck supple. No tracheal deviation present. No thyromegaly present.  Cardiovascular: Normal rate, regular rhythm, normal heart sounds and intact distal pulses.  Exam reveals no gallop and no friction rub.   No murmur heard. Pulmonary/Chest: Effort normal and breath sounds normal. No accessory muscle  usage. No tachypnea. No respiratory distress. She has no decreased breath sounds. She has no wheezes. She has no rhonchi. She has no rales. She exhibits no tenderness. Left breast exhibits no inverted nipple, no mass, no nipple discharge, no skin change and no tenderness.    Musculoskeletal: Normal range of motion. She exhibits no edema or tenderness.  Lymphadenopathy:    She has no cervical adenopathy.  Neurological: She is alert and oriented to person, place, and time. No cranial nerve deficit. She exhibits normal muscle tone. Coordination normal.  Skin: Skin is warm and dry. No rash noted. She is not diaphoretic. No erythema. No pallor.  Psychiatric: She has a normal mood and affect. Her behavior is normal. Judgment and thought content normal.          Assessment & Plan:   Problem List Items Addressed This Visit      Unprioritized   Breast nodule - Primary    Breast nodule likely skin irritation/folliculitis that has now resolved. Additional imaging including diagnostic mammogram was negative. Pt will call if any recurrent symptoms. We discussed need for biopsy if any palpable persistent nodular area.          Return in about 4 months (around 03/03/2015) for Wellness Visit.

## 2014-11-02 NOTE — Progress Notes (Signed)
Pre visit review using our clinic review tool, if applicable. No additional management support is needed unless otherwise documented below in the visit note. 

## 2014-11-02 NOTE — Assessment & Plan Note (Signed)
Breast nodule likely skin irritation/folliculitis that has now resolved. Additional imaging including diagnostic mammogram was negative. Pt will call if any recurrent symptoms. We discussed need for biopsy if any palpable persistent nodular area.

## 2014-12-17 ENCOUNTER — Telehealth: Payer: Self-pay | Admitting: *Deleted

## 2014-12-17 MED ORDER — SIMVASTATIN 20 MG PO TABS: ORAL_TABLET | ORAL | Status: DC

## 2014-12-17 NOTE — Telephone Encounter (Signed)
[facility] called to check Rx status.  Ivin Booty sending now.

## 2014-12-17 NOTE — Telephone Encounter (Signed)
°  1. Which medications need to be refilled? Simvastatin  2. Which pharmacy is medication to be sent to? [facility]   3. Do they need a 30 day or 90 day supply? 90 day   4. Would they like a call back once the medication has been sent to the pharmacy? No

## 2014-12-17 NOTE — Telephone Encounter (Signed)
Refill sent for 90 day supply for simvastatin to [facility].

## 2015-03-07 ENCOUNTER — Encounter: Payer: Self-pay | Admitting: Cardiovascular Disease

## 2015-03-07 ENCOUNTER — Ambulatory Visit (INDEPENDENT_AMBULATORY_CARE_PROVIDER_SITE_OTHER): Payer: Medicare Other | Admitting: Cardiovascular Disease

## 2015-03-07 ENCOUNTER — Other Ambulatory Visit: Payer: Self-pay | Admitting: Cardiovascular Disease

## 2015-03-07 VITALS — BP 140/80 | HR 91 | Ht 68.0 in | Wt 159.8 lb

## 2015-03-07 DIAGNOSIS — E785 Hyperlipidemia, unspecified: Secondary | ICD-10-CM

## 2015-03-07 DIAGNOSIS — G4733 Obstructive sleep apnea (adult) (pediatric): Secondary | ICD-10-CM

## 2015-03-07 DIAGNOSIS — R0789 Other chest pain: Secondary | ICD-10-CM

## 2015-03-07 DIAGNOSIS — R002 Palpitations: Secondary | ICD-10-CM

## 2015-03-07 DIAGNOSIS — R000 Tachycardia, unspecified: Secondary | ICD-10-CM | POA: Diagnosis not present

## 2015-03-07 MED ORDER — NEBIVOLOL HCL 5 MG PO TABS: 5.0000 mg | ORAL_TABLET | Freq: Every day | ORAL | Status: DC

## 2015-03-07 NOTE — Assessment & Plan Note (Signed)
She reports that she is sleeping well. Details of her sleep apnea were not discussed with her

## 2015-03-07 NOTE — Assessment & Plan Note (Signed)
Relatively well-controlled palpitations. She has APCs, short runs of narrow complex tachycardia previously documented on 30 day monitor in 2014. We will continue low-dose beta blocker. If symptoms get worse, would recommend higher dose beta blocker, in addition, antiarrhythmics could be used

## 2015-03-07 NOTE — Patient Instructions (Addendum)
You are doing well. No medication changes were made.  We will check liver and lipids at your convenience (lab slips to take with you for [facility])  Please call if  short runs of tachycardia gets more frequent or longer  Please call us if you have new issues that need to be addressed before your next appt.  Your physician wants you to follow-up in: 12 months.  You will receive a reminder letter in the mail two months in advance. If you don't receive a letter, please call our office to schedule the follow-up appointment.

## 2015-03-07 NOTE — Assessment & Plan Note (Signed)
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 This is rare, about the same as before We have recommended if symptoms get worse that she call our office. Could consider using antiarrhythmics

## 2015-03-07 NOTE — Assessment & Plan Note (Signed)
We have given her a lab slip to have her fasting labs done this week

## 2015-03-07 NOTE — Progress Notes (Signed)
Patient ID: Meghan Mack, female    DOB: [birth year], 64 y.o.   MRN: [id number]  HPI Comments: Meghan Mack is a very pleasant 64 year old woman with a history of palpitations, anxiety, remote history of chest pain, hyperlipidemia and hypertension with a strong family history of coronary artery disease who presents for routine followup  Of her palpitations and tachycardia. Prior 30 day monitor in 2014 showed APCs and short runs of atrial tachycardia/SVT with rate 166 bpm Prior history of anxiety  In follow-up today, blood pressure is typically well controlled, labile from the low 100s up to 140s. Heart rates are typically well controlled. For elevated in Dr. Paulene Floor. She again appreciates short runs of tachycardia lasting 5-8 beats, rare. In general takes bystolic  2.5 mg  daily, occasionally with extra dosing for breakthrough palpitations . No recent  cholesterol level otherwise she feels well, is active, going camping for 2 months in [place] simvastatin Previously was walking on a daily basis  EKG on today's visit shows normal sinus rhythm with rate 91 bpm, no significant ST or T-wave changes        Allergies  Allergen Reactions  . Tdap [Diphth-Acell Pertussis-Tetanus] Swelling and Rash  . Codeine   . Diflucan [Fluconazole] Hives  . Esomeprazole Magnesium     REACTION: nausea  . Meperidine Hcl     REACTION: Causes heart to race  . Propantheline Bromide   . Propoxyphene N-Acetaminophen     REACTION: heart race  . Sulfonamide Derivatives     Current Outpatient Prescriptions on File Prior to Visit  Medication Sig Dispense Refill  . acyclovir ointment (ZOVIRAX) 5 % Apply 1 application topically every 3 (three) hours.     . ergocalciferol (VITAMIN D2) 50000 UNITS capsule Take 50,000 Units by mouth every 30 (thirty) days.    Marland Kitchen levothyroxine (SYNTHROID, LEVOTHROID) 88 MCG tablet Take 1 tablet 6 days a week and 1/2 tablet on sunday    . Multiple Vitamin (MULTIVITAMIN PO)  Take 1 tablet by mouth daily.      . ranitidine (ZANTAC) 150 MG tablet Take 150 mg by mouth as needed.      . simvastatin (ZOCOR) 20 MG tablet Take 1 tablet (20 mg total) by mouth daily. 90 tablet 3   No current facility-administered medications on file prior to visit.    Past Medical History  Diagnosis Date  . COPD (chronic obstructive pulmonary disease)   . Colonic polyp   . IBS (irritable bowel syndrome)   . Hyperlipidemia   . Anemia   . Palpitations   . Constipation   . Nausea     Past Surgical History  Procedure Laterality Date  . Foot surgery  04/25/2007    right  . Vaginal hysterectomy      ovaries intact  . Esophagogastroduodenoscopy  10/1997 & 08/2005  . Colonoscopy    . Thyroidectomy  11/2005  . Cardiac catheterization  02/25/2007    Social History  reports that she quit smoking about 17 years ago. Her smoking use included Cigarettes. She has a 2.5 pack-year smoking history. She has never used smokeless tobacco. She reports that she does not drink alcohol or use illicit drugs.  Family History family history includes Cancer in her brother, father, maternal aunt, maternal grandfather, and mother; Emphysema in her father; Heart disease in her father.       Review of Systems  Constitutional: Negative.   HENT: Negative.   Respiratory: Negative.   Cardiovascular: Positive for palpitations.  Gastrointestinal: Negative.   Musculoskeletal: Negative.   Skin: Negative.   Neurological: Negative.   Hematological: Negative.   Psychiatric/Behavioral: Negative.   All other systems reviewed and are negative.   BP 140/80 mmHg  Pulse 91  Ht 5\' 8"  (1.727 m)  Wt 159 lb 12 oz (72.462 kg)  BMI 24.30 kg/m2  Physical Exam  Constitutional: She is oriented to person, place, and time. She appears well-developed and well-nourished.  HENT:  Head: Normocephalic.  Nose: Nose normal.  Mouth/Throat: Oropharynx is clear and moist.  Eyes: Conjunctivae are normal. Pupils are  equal, round, and reactive to light.  Neck: Normal range of motion. Neck supple. No JVD present.  Cardiovascular: Normal rate, regular rhythm, S1 normal, S2 normal, normal heart sounds and intact distal pulses.  Exam reveals no gallop and no friction rub.   No murmur heard. Pulmonary/Chest: Effort normal and breath sounds normal. No respiratory distress. She has no wheezes. She has no rales. She exhibits no tenderness.  Abdominal: Soft. Bowel sounds are normal. She exhibits no distension. There is no tenderness.  Musculoskeletal: Normal range of motion. She exhibits no edema or tenderness.  Lymphadenopathy:    She has no cervical adenopathy.  Neurological: She is alert and oriented to person, place, and time. Coordination normal.  Skin: Skin is warm and dry. No rash noted. No erythema.  Psychiatric: She has a normal mood and affect. Her behavior is normal. Judgment and thought content normal.    Assessment and Plan  Nursing note and vitals reviewed.

## 2015-03-08 LAB — LIPID PANEL W/O CHOL/HDL RATIO
Cholesterol, Total: 163 mg/dL (ref 100–199)
HDL: 80 mg/dL (ref >39)
LDL Calculated: 70 mg/dL (ref 0–99)
Triglycerides: 67 mg/dL (ref 0–149)
VLDL Cholesterol Cal: 13 mg/dL (ref 5–40)

## 2015-03-08 LAB — HEPATIC FUNCTION PANEL
ALT: 28 IU/L (ref 0–32)
AST: 25 IU/L (ref 0–40)
Albumin: 4 g/dL (ref 3.6–4.8)
Alkaline Phosphatase: 103 IU/L (ref 39–117)
Bilirubin Total: 0.3 mg/dL (ref 0.0–1.2)
Bilirubin, Direct: 0.11 mg/dL (ref 0.00–0.40)
Total Protein: 6.1 g/dL (ref 6.0–8.5)

## 2015-03-11 ENCOUNTER — Telehealth: Payer: Self-pay | Admitting: Internal Medicine

## 2015-03-11 NOTE — Telephone Encounter (Signed)
Spoke with the patient, she received a MyChart message that she was overdue for her pneumonia vaccine.  Discussed and plan to received it at her next appointment with Dr. Gilford Rile in September.

## 2015-03-11 NOTE — Telephone Encounter (Signed)
Pt called to state that she rec a msg that she needed to get a pneumonia shot. Pt stated that she had on 08/11/2007. Please advise/msn

## 2015-04-11 ENCOUNTER — Other Ambulatory Visit: Payer: Self-pay

## 2015-04-21 ENCOUNTER — Telehealth: Payer: Self-pay

## 2015-04-21 NOTE — Telephone Encounter (Signed)
Pt called, asks if Dr. Rockey Situ still wants her to take a baby aspirin, please call and advise.

## 2015-04-21 NOTE — Telephone Encounter (Signed)
I don't see that it has ever been on her med list.  Do you want her to take asa 81 mg daily?

## 2015-04-24 NOTE — Telephone Encounter (Signed)
Not a strong indication for asa at this time as no known CAD Would probably not take for now

## 2015-04-25 NOTE — Telephone Encounter (Signed)
Spoke w/ pt.  Advised her of Dr. Gollan's recommendation.  She is agreeable and will call back w/ any questions or concerns.  

## 2015-05-04 ENCOUNTER — Telehealth: Payer: Self-pay | Admitting: Internal Medicine

## 2015-05-04 NOTE — Telephone Encounter (Signed)
Please see below request  

## 2015-05-04 NOTE — Telephone Encounter (Signed)
Pt is wanting to have lab work done before her physical.. Would like it done at [facility].. Please advise pt.Marland Kitchen

## 2015-05-04 NOTE — Telephone Encounter (Signed)
We can fill out a lab order sheet if she would like.

## 2015-05-05 NOTE — Telephone Encounter (Signed)
Order completed and put in the mail to pt

## 2015-06-16 ENCOUNTER — Encounter: Payer: Self-pay | Admitting: Internal Medicine

## 2015-06-27 ENCOUNTER — Encounter: Payer: Self-pay | Admitting: Internal Medicine

## 2015-06-27 ENCOUNTER — Other Ambulatory Visit: Payer: Self-pay | Admitting: Internal Medicine

## 2015-06-27 ENCOUNTER — Ambulatory Visit (INDEPENDENT_AMBULATORY_CARE_PROVIDER_SITE_OTHER): Payer: Medicare Other | Admitting: Internal Medicine

## 2015-06-27 VITALS — BP 119/78 | HR 91 | Temp 98.1°F | Ht 67.0 in | Wt 160.2 lb

## 2015-06-27 DIAGNOSIS — D329 Benign neoplasm of meninges, unspecified: Secondary | ICD-10-CM | POA: Diagnosis not present

## 2015-06-27 DIAGNOSIS — Z0000 Encounter for general adult medical examination without abnormal findings: Secondary | ICD-10-CM | POA: Diagnosis not present

## 2015-06-27 DIAGNOSIS — R413 Other amnesia: Secondary | ICD-10-CM

## 2015-06-27 DIAGNOSIS — Z23 Encounter for immunization: Secondary | ICD-10-CM | POA: Diagnosis not present

## 2015-06-27 NOTE — Assessment & Plan Note (Signed)
MRI brain showed meningioma which has been stable over serial exams. We discussed that this is likely not causing symptoms of memory loss.

## 2015-06-27 NOTE — Progress Notes (Signed)
The patient is here for annual Medicare Wellness Examination and management of other chronic and acute problems.   The risk factors are reflected in the history.  The roster of all physicians providing medical care to patient - is listed in the Snapshot section of the chart.  Activities of daily living:   The patient is 100% independent in all ADLs: dressing, toileting, feeding as well as independent mobility. Patient lives with husband in a one story home. One dog.  Home safety :  The patient has smoke detectors in the home.  They wear seatbelts in their car. There are no firearms at home.  There is no violence in the home. They feel safe where they live.  Infectious Risks: There is no risks for hepatitis, STDs or HIV.  There is no  history of blood transfusion.  They have no travel history to infectious disease endemic areas of the world.  Additional Health Care Providers: The patient has seen their dentist in the last six months. Dentist - Dr. Carman Ching They have seen their eye doctor in the last year. Opthalmologist - [facility] They deny hearing issues. They have deferred audiologic testing in the last year.   They do not  have excessive sun exposure. Discussed the need for sun protection: hats,long sleeves and use of sunscreen if there is significant sun exposure.  Dermatologist - Dr. Kellie Moor Cardiologist - Dr. Rockey Situ  Diet: the importance of a healthy diet is discussed. They do have a healthy diet.  The benefits of regular aerobic exercise were discussed. Patient exercises by walking some. No longer gets [employer].  Depression screen: there are no signs or vegative symptoms of depression- irritability, change in appetite, anhedonia, sadness/tearfullness.  Cognitive assessment: the patient manages all their financial and personal affairs and is actively engaged.   HCPOA - not yet Living Will - yes  The following portions of the patient's history were reviewed and  updated as appropriate: allergies, current medications, past family history, past medical history,  past surgical history, past social history and problem list.  Visual acuity was not assessed per patient preference as they have regular follow up with their ophthalmologist. Hearing and body mass index were assessed and reviewed.   During the course of the visit the patient was educated and counseled about appropriate screening and preventive services including : fall prevention , diabetes screening, nutrition counseling, colorectal cancer screening, and recommended immunizations.    Abnormal MRI Brain/Memory Loss - Meningioma on MRI. Seen by neurosurgery in [place]. Reports they did not recommend any changes. Would like a second opinion. Reports several months of trouble with word finding on occasion. No other focal symptoms.   Past Medical History  Diagnosis Date  . COPD (chronic obstructive pulmonary disease)   . Colonic polyp   . IBS (irritable bowel syndrome)   . Hyperlipidemia   . Anemia   . Palpitations   . Constipation   . Nausea    Family History  Problem Relation Age of Onset  . Cancer Mother     colon  . Emphysema Father   . Heart disease Father   . Cancer Father     leukemia  . Cancer Brother     lung  . Cancer Maternal Aunt     melanoma  . Cancer Maternal Grandfather     stomach   Social History   Social History  . Marital Status: Married    Spouse Name: N/A  . Number of Children: 2  .  Years of Education: N/A   Occupational History  . [employer]   Social History Main Topics  . Smoking status: Former Smoker -- 0.25 packs/day for 10 years    Types: Cigarettes    Quit date: 09/25/1997  . Smokeless tobacco: Never Used  . Alcohol Use: No  . Drug Use: No  . Sexual Activity:    Partners: Male    Birth Control/ Protection: Surgical   Other Topics Concern  . Not on file   Social History Narrative   Lives with husband in [place],  has 2 children and 2 step-children. No pets.      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 Foot surgery  04/25/2007    right  . Vaginal hysterectomy      ovaries intact  . Esophagogastroduodenoscopy  10/1997 & 08/2005  . Colonoscopy    . Thyroidectomy  11/2005  . Cardiac catheterization  02/25/2007    Review of Systems  Constitutional: Negative for fever, chills, appetite change, fatigue and unexpected weight change.  Eyes: Negative for visual disturbance.  Respiratory: Negative for shortness of breath.   Cardiovascular: Negative for chest pain and leg swelling.  Gastrointestinal: Negative for nausea, vomiting, abdominal pain, diarrhea and constipation.  Musculoskeletal: Negative for myalgias and arthralgias.  Skin: Negative for color change and rash.  Neurological: Negative for dizziness, seizures, speech difficulty, weakness, light-headedness, numbness and headaches.  Hematological: Negative for adenopathy. Does not bruise/bleed easily.  Psychiatric/Behavioral: Positive for decreased concentration. Negative for suicidal ideas, behavioral problems, confusion, sleep disturbance, dysphoric mood and agitation. The patient is not nervous/anxious.        Objective:    BP 119/78 mmHg  Pulse 91  Temp(Src) 98.1 F (36.7 C) (Oral)  Ht 5\' 7"  (1.702 m)  Wt 160 lb 4 oz (72.689 kg)  BMI 25.09 kg/m2  SpO2 100% Physical Exam  Constitutional: She is oriented to person, place, and time. She appears well-developed and well-nourished. No distress.  HENT:  Head: Normocephalic and atraumatic.  Right Ear: External ear normal.  Left Ear: External ear normal.  Nose: Nose normal.  Mouth/Throat: Oropharynx is clear and moist. No oropharyngeal exudate.  Eyes: Conjunctivae are normal. Pupils are equal, round, and reactive to light. Right eye exhibits no discharge. Left eye exhibits no  discharge. No scleral icterus.  Neck: Normal range of motion. Neck supple. No tracheal deviation present. No thyromegaly present.  Cardiovascular: Normal rate, regular rhythm, normal heart sounds and intact distal pulses.  Exam reveals no gallop and no friction rub.   No murmur heard. Pulmonary/Chest: Effort normal and breath sounds normal. No accessory muscle usage. No tachypnea. No respiratory distress. She has no decreased breath sounds. She has no wheezes. She has no rales. She exhibits no tenderness. Right breast exhibits no inverted nipple, no mass, no nipple discharge, no skin change and no tenderness. Left breast exhibits no inverted nipple, no mass, no nipple discharge, no skin change and no tenderness. Breasts are symmetrical.  Abdominal: Soft. Bowel sounds are normal. She exhibits no distension and no mass. There is no tenderness. There is no rebound and no guarding.  Musculoskeletal: Normal range of motion. She exhibits no edema or tenderness.  Lymphadenopathy:    She has no cervical adenopathy.  Neurological: She is alert and oriented to person, place, and time. No cranial  nerve deficit. She exhibits normal muscle tone. Coordination normal.  Skin: Skin is warm and dry. No rash noted. She is not diaphoretic. No erythema. No pallor.  Psychiatric: She has a normal mood and affect. Her behavior is normal. Judgment and thought content normal.          Assessment & Plan:  Patient was given a handout regarding current recommendations for health maintenance and preventative care on the AVS.  Problem List Items Addressed This Visit      Unprioritized   Medicare annual wellness visit, initial - Primary    General medical exam including breast exam normal today. PAP and pelvic deferred as normal in 2015. Mammogram due in 09/2015. Colonoscopy UTD. Labs reviewed. Encouraged healthy diet and exercise.      MEMORY LOSS    Recent issues with word finding, patient concerned about memory  loss. Exam normal today. Reviewed MRI. I do not see findings that suggest previous CVA. Will check B12 with labs. Recent TSH was normal. Will set up neurology evaluation.      Meningioma    MRI brain showed meningioma which has been stable over serial exams. We discussed that this is likely not causing symptoms of memory loss.      Relevant Orders   Ambulatory referral to Neurology       Return in about 6 months (around 12/25/2015) for Recheck.

## 2015-06-27 NOTE — Addendum Note (Signed)
Addended by: Vernetta Honey on: 06/27/2015 04:14 PM   Modules accepted: Orders

## 2015-06-27 NOTE — Progress Notes (Signed)
Pre visit review using our clinic review tool, if applicable. No additional management support is needed unless otherwise documented below in the visit note. 

## 2015-06-27 NOTE — Assessment & Plan Note (Signed)
Recent issues with word finding, patient concerned about memory loss. Exam normal today. Reviewed MRI. I do not see findings that suggest previous CVA. Will check B12 with labs. Recent TSH was normal. Will set up neurology evaluation.

## 2015-06-27 NOTE — Patient Instructions (Signed)

## 2015-06-27 NOTE — Assessment & Plan Note (Signed)
General medical exam including breast exam normal today. PAP and pelvic deferred as normal in 2015. Mammogram due in 09/2015. Colonoscopy UTD. Labs reviewed. Encouraged healthy diet and exercise.

## 2015-06-28 LAB — HCV COMMENT:

## 2015-06-28 LAB — HEPATITIS C ANTIBODY (REFLEX): HCV Ab: <0.1 s/co ratio (ref 0.0–0.9)

## 2015-06-28 LAB — B12 AND FOLATE PANEL
Folate: >20 ng/mL (ref >3.0)
Vitamin B-12: 810 pg/mL (ref 211–946)

## 2015-06-30 ENCOUNTER — Other Ambulatory Visit: Payer: Self-pay

## 2015-06-30 DIAGNOSIS — Z1231 Encounter for screening mammogram for malignant neoplasm of breast: Secondary | ICD-10-CM

## 2015-07-12 ENCOUNTER — Encounter: Payer: Self-pay | Admitting: Internal Medicine

## 2015-08-03 ENCOUNTER — Ambulatory Visit (INDEPENDENT_AMBULATORY_CARE_PROVIDER_SITE_OTHER): Payer: Medicare Other | Admitting: Neurology

## 2015-08-03 ENCOUNTER — Encounter: Payer: Self-pay | Admitting: Neurology

## 2015-08-03 VITALS — BP 130/90 | HR 111 | Ht 67.0 in | Wt 161.1 lb

## 2015-08-03 DIAGNOSIS — G609 Hereditary and idiopathic neuropathy, unspecified: Secondary | ICD-10-CM | POA: Diagnosis not present

## 2015-08-03 DIAGNOSIS — D329 Benign neoplasm of meninges, unspecified: Secondary | ICD-10-CM | POA: Diagnosis not present

## 2015-08-03 DIAGNOSIS — R413 Other amnesia: Secondary | ICD-10-CM | POA: Diagnosis not present

## 2015-08-03 DIAGNOSIS — R29898 Other symptoms and signs involving the musculoskeletal system: Secondary | ICD-10-CM

## 2015-08-03 NOTE — Patient Instructions (Signed)
To evaluate memory, we will send you for neuropsychological testing To evaluate leg weakness and walking problems, we will schedule you for nerve conduction study and muscle test Follow up afterwards.

## 2015-08-03 NOTE — Progress Notes (Addendum)
NEUROLOGY CONSULTATION NOTE  [name] MRN: [id number] DOB: [birth year]  Referring provider: Dr. Gilford Rile Primary care provider: Dr. Gilford Rile  Reason for consult:  Meningioma, memory deficits.  HISTORY OF PRESENT ILLNESS: Meghan Mack is a 64 year old right-handed female with COPD, IBS, anxiety, Sjogren's, hypothyroidism and hyperlipidemia who presents for meningioma and memory loss.  History obtained by patient and PCP note.  Labs and MRI brain images from 2015, 2013, 2011, 2009 and 2008 personally reviewed.  She has known anterior left tentorial meningioma since 2008.  For several years, she has been followed by Dr. Sherwood Gambler at [facility].  Observation was recommended.  Most recent MRI of brain available from 01/24/14 showed it to be 9 x 9 x 6 mm, which appears stable compared to prior imaging from 2013, 2011, 2009 and 2008 (measuring around 8 x7 mm to 9 x 6 x 6 mm).  There is no surrounding vasogenic edema.  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  Over the past year, she reports increased memory problems.  When she sees an object, she has trouble remembering the name, although she recognizes it.  This has been happening more frequently.  She also has had trouble recalling names of people she knows and sees frequently, such as the name of her close friend's wife.  On occasion, she has needed to get out the recipe of a cake that she has baked on multiple occasions for years.  Rarely, she may briefly be disoriented while driving on familiar routes.  Recent B12 was 810.  She has hypothyroidism due to surgical resection for Hashimoto's and takes Synthroid.  It is reportedly controlled.  Last TSH from March 2015 was 1.100.   Also, over the past year, she reports falls.  She has tripped and fallen about 4 times.  She says that her right leg just doesn't "want to move".  If she is squatting, she is unable to stand up without pushing off with  her arms.  She denies numbness in the feet.  Sometimes, if she clutches something hard or opens up a jar, it is difficult for her to open her hand.  She denies problems swallowing or shortness of breath.  Her mother developed dementia in her late 15s.  She began having gait issues in her 65s or 70s.  Otherwise, she denies other family history of dementia or gait problems.  PAST MEDICAL HISTORY: Past Medical History  Diagnosis Date  . COPD (chronic obstructive pulmonary disease) ([place])   . Colonic polyp   . IBS (irritable bowel syndrome)   . Hyperlipidemia   . Anemia   . Palpitations   . Constipation   . Nausea     PAST SURGICAL HISTORY: Past Surgical History  Procedure Laterality Date  . Foot surgery  04/25/2007    right  . Vaginal hysterectomy      ovaries intact  . Esophagogastroduodenoscopy  10/1997 & 08/2005  . Colonoscopy    . Thyroidectomy  11/2005  . Cardiac catheterization  02/25/2007    MEDICATIONS: Current Outpatient Prescriptions on File Prior to Visit  Medication Sig Dispense Refill  . acyclovir ointment (ZOVIRAX) 5 % Apply 1 application topically every 3 (three) hours.     . ergocalciferol (VITAMIN D2) 50000 UNITS capsule Take 50,000 Units by mouth every 30 (thirty) days.    Marland Kitchen levothyroxine (SYNTHROID, LEVOTHROID) 88 MCG tablet Take 1 tablet 6 days a week and 1/2 tablet on sunday    .  Multiple Vitamin (MULTIVITAMIN PO) Take 1 tablet by mouth daily.      . nebivolol (BYSTOLIC) 5 MG tablet Take 1 tablet (5 mg total) by mouth daily. 90 tablet 3  . ranitidine (ZANTAC) 150 MG tablet Take 150 mg by mouth as needed.      . simvastatin (ZOCOR) 20 MG tablet Take 1 tablet (20 mg total) by mouth daily. 90 tablet 3   No current facility-administered medications on file prior to visit.    ALLERGIES: Allergies  Allergen Reactions  . Tdap [Diphth-Acell Pertussis-Tetanus] Swelling and Rash  . Codeine   . Diflucan [Fluconazole] Hives  . Esomeprazole Magnesium      REACTION: nausea  . Meperidine Hcl     REACTION: Causes heart to race  . Propantheline Bromide   . Propoxyphene N-Acetaminophen     REACTION: heart race  . Sulfonamide Derivatives     FAMILY HISTORY: Family History  Problem Relation Age of Onset  . Cancer Mother     colon  . Emphysema Father   . Heart disease Father   . Cancer Father     leukemia  . Cancer Brother     lung  . Cancer Maternal Aunt     melanoma  . Cancer Maternal Grandfather     stomach    SOCIAL HISTORY: Social History   Social History  . Marital Status: Married    Spouse Name: N/A  . Number of Children: 2  . Years of Education: N/A   Occupational History  . [employer]   Social History Main Topics  . Smoking status: Former Smoker -- 0.25 packs/day for 10 years    Types: Cigarettes    Quit date: 09/25/1997  . Smokeless tobacco: Never Used  . Alcohol Use: No  . Drug Use: No  . Sexual Activity:    Partners: Male    Birth Control/ Protection: Surgical   Other Topics Concern  . Not on file   Social History Narrative   Lives with husband in [place], has 2 children and 2 step-children. No pets.      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      Diet - regular   Exercise - walks 5 days per week    REVIEW OF SYSTEMS: Constitutional: No fevers, chills, or sweats, no generalized fatigue, change in appetite Eyes: No visual changes, double vision, eye pain Ear, nose and throat: No hearing loss, ear pain, nasal congestion, sore throat Cardiovascular: No chest pain, palpitations Respiratory:  No shortness of breath at rest or with exertion, wheezes GastrointestinaI: No nausea, vomiting, diarrhea, abdominal pain, fecal incontinence Genitourinary:  No dysuria, urinary retention or frequency Musculoskeletal:  No neck pain, back pain Integumentary: No rash, pruritus, skin lesions Neurological: as above Psychiatric: No depression, insomnia, anxiety Endocrine: No palpitations, fatigue,  diaphoresis, mood swings, change in appetite, change in weight, increased thirst Hematologic/Lymphatic:  No anemia, purpura, petechiae. Allergic/Immunologic: no itchy/runny eyes, nasal congestion, recent allergic reactions, rashes  PHYSICAL EXAM: Filed Vitals:   08/03/15 0821  BP: 130/90  Pulse: 111   General: No acute distress.  Patient appears well-groomed. Head:  Normocephalic/atraumatic.  Bilateral temporal wasting Eyes:  fundi unremarkable, without vessel changes, exudates, hemorrhages or papilledema. Neck: supple, no paraspinal tenderness, full range of motion Back: No paraspinal tenderness Heart: regular rate and rhythm Lungs: Clear to auscultation bilaterally. Vascular: No carotid bruits. Neurological Exam: Mental status: alert and oriented to person, place, and time, recent and remote memory intact, fund  of knowledge intact, attention and concentration intact, speech fluent and not dysarthric, language intact. MMSE - Mini Mental State Exam 08/03/2015  Orientation to time 5  Orientation to Place 5  Registration 3  Attention/ Calculation 4  Recall 2  Language- name 2 objects 2  Language- repeat 1  Language- follow 3 step command 3  Language- read & follow direction 1  Write a sentence 1  Copy design 1  Total score 28   Cranial nerves: CN I: not tested CN II: pupils equal, round and reactive to light, visual fields intact, fundi unremarkable, without vessel changes, exudates, hemorrhages or papilledema. CN III, IV, VI:  full range of motion, no nystagmus, no ptosis CN V: facial sensation intact CN VII: upper and lower face symmetric CN VIII: hearing intact CN IX, X: gag intact, uvula midline CN XI: sternocleidomastoid and trapezius muscles intact CN XII: tongue midline Bulk & Tone: wasting in the intrinsic hand muscles. Motor:  4+/5 bilateral hip flexion.  5-/5 intrinsic hand muscles.  Otherwise 5/5. Sensation:  Pinprick sensation intact.  Reduced vibration  sensation in lower extremities up to knees. Deep Tendon Reflexes:  2+ throughout, toes downgoing.  Finger to nose testing:  Without dysmetria.  Heel to shin:  Without dysmetria.  Gait:  Normal station and stride.  Able to turn and tandem walk. Romberg negative.  IMPRESSION: Memory deficits Lower extremity weakness and neuropathy Meningioma.  Meningioma.  I looked at the multiple MRIs going back to 2008, and it seems stable.  I don't believe it is contributing to her symptoms.  I would like to evaluate Mrs. Lampkins for underlying neuromuscular disease.  Consider myopathy such as inclusion body myositis (given distal hand weakness and proximal leg weakness).  Motor neuron disease is possible, however I don't appreciate upper motor neuron signs.  With temporal wasting, also consider myotonic dystrophy.   She also appears to have an underlying peripheral neuropathy, which may be separate, as this can be found in Sjogrens or hypothyroidism (although this seems controlled).  PLAN: 1.  We will get NCV-EMG to evaluate for a neuromuscular disorder/neuropathy 2.  We will refer patient for neuropsychological testing 3.  Check TSH and CK 4.  Follow up after testing.  Thank you for allowing me to take part in the care of this patient.  Metta Clines, DO  CC:  Ronette Deter, MD

## 2015-08-09 ENCOUNTER — Encounter: Payer: Medicare Other | Admitting: Neurology

## 2015-08-09 ENCOUNTER — Encounter: Payer: Self-pay | Admitting: Neurology

## 2015-08-15 ENCOUNTER — Telehealth: Payer: Self-pay | Admitting: Neurology

## 2015-08-15 NOTE — Telephone Encounter (Signed)
Pt wants to know if her blood work is back and if so the results please call [phone]

## 2015-08-22 ENCOUNTER — Ambulatory Visit: Payer: Medicare Other | Admitting: Neurology

## 2015-08-22 NOTE — Telephone Encounter (Signed)
No, it must be the NCV-EMG

## 2015-08-22 NOTE — Telephone Encounter (Signed)
Spoke with patient. Blood work has not been resulted yet. Called [facility], testing still in process. While on phone patient did question if there was a less invasive testing  to evaluate for a neuromuscular disorder/neuropathy other than the NCV-EMG. Please advise.

## 2015-08-23 NOTE — Telephone Encounter (Signed)
Message relayed to patient. Verbalized understanding and denied questions.   

## 2015-09-02 ENCOUNTER — Telehealth: Payer: Self-pay

## 2015-09-02 NOTE — Telephone Encounter (Signed)
Usually the TSH would be higher.  However, I forgot she was taking Synthroid, so we can hold off on tests.

## 2015-09-02 NOTE — Telephone Encounter (Signed)
-----   Message from Pieter Partridge, DO sent at 09/02/2015  9:00 AM EST ----- CK, the test for muscle damage, is normal.  TSH, which looks at thyroid function, is borderline low.  It may be of no clinical significance, but I would check free T3 and free T4.

## 2015-09-02 NOTE — Telephone Encounter (Signed)
Message relayed to patient. Verbalized understanding and denied questions.   

## 2015-09-02 NOTE — Telephone Encounter (Signed)
Message was relayed to patient. Pt wants to know if the borderline low could be due to her having her thyroid removed in 2005. Please advise.

## 2015-09-20 ENCOUNTER — Ambulatory Visit
Admission: RE | Admit: 2015-09-20 | Discharge: 2015-09-20 | Disposition: A | Discharge status: Home / Self Care | Payer: Medicare Other | Source: Ambulatory Visit

## 2015-09-20 DIAGNOSIS — Z1231 Encounter for screening mammogram for malignant neoplasm of breast: Secondary | ICD-10-CM

## 2015-11-07 ENCOUNTER — Other Ambulatory Visit: Payer: Self-pay | Admitting: Otolaryngology

## 2015-11-07 DIAGNOSIS — R609 Edema, unspecified: Secondary | ICD-10-CM

## 2015-11-11 ENCOUNTER — Ambulatory Visit (INDEPENDENT_AMBULATORY_CARE_PROVIDER_SITE_OTHER): Payer: 59 | Admitting: Internal Medicine

## 2015-11-11 ENCOUNTER — Encounter: Payer: Self-pay | Admitting: Internal Medicine

## 2015-11-11 VITALS — BP 128/72 | HR 102 | Temp 98.2°F | Wt 164.5 lb

## 2015-11-11 DIAGNOSIS — F419 Anxiety disorder, unspecified: Secondary | ICD-10-CM | POA: Diagnosis not present

## 2015-11-11 DIAGNOSIS — E039 Hypothyroidism, unspecified: Secondary | ICD-10-CM | POA: Diagnosis not present

## 2015-11-11 DIAGNOSIS — R413 Other amnesia: Secondary | ICD-10-CM | POA: Diagnosis not present

## 2015-11-11 MED ORDER — PAROXETINE HCL 10 MG PO TABS
10.0000 mg | ORAL_TABLET | Freq: Every day | ORAL | Status: DC
Start: 2015-11-11 — End: 2016-01-19

## 2015-11-11 NOTE — Assessment & Plan Note (Signed)
Recent TSH low at 0.428. Dose of Levothyroxine decreased to 71mcg. Follow up labs pending.

## 2015-11-11 NOTE — Assessment & Plan Note (Signed)
Anxiety contributing to memory loss. Will start Paxil as this worked well for her in distant past. Discussed several alternative medications and potential side effects. Follow up in 4 weeks.

## 2015-11-11 NOTE — Progress Notes (Signed)
Pre visit review using our clinic review tool, if applicable. No additional management support is needed unless otherwise documented below in the visit note. 

## 2015-11-11 NOTE — Patient Instructions (Addendum)
Start Paxil 10mg  daily.  Keep food diary over the next few weeks.  Follow up in 4 weeks.

## 2015-11-11 NOTE — Assessment & Plan Note (Signed)
Recently had evaluation for memory loss. Will request records on this.

## 2015-11-11 NOTE — Progress Notes (Signed)
Subjective:    Patient ID: Meghan Mack, female    DOB: [birth year], 65 y.o.   MRN: [id number]  HPI  65YO female presents for follow up.  Seen by neurology. Had testing to look for memory loss. Testing reportedly normal. Recommended treatment for anxiety. She notes some mild anxiety. Not taking anything for this. Took Paxil in the past for panic attacks. Tolerated this well.  Also worried about weight. Walking several days per week. Not following any particular diet right now.  TSH was recently found to be low and dose of Levothyroxine decreased. Follow up labs pending.  Wt Readings from Last 3 Encounters:  11/11/15 164 lb 8 oz (74.617 kg)  08/03/15 161 lb 2 oz (73.086 kg)  06/27/15 160 lb 4 oz (72.689 kg)   BP Readings from Last 3 Encounters:  11/11/15 128/72  08/03/15 130/90  06/27/15 119/78    Past Medical History  Diagnosis Date  . COPD (chronic obstructive pulmonary disease) ([place])   . Colonic polyp   . IBS (irritable bowel syndrome)   . Hyperlipidemia   . Anemia   . Palpitations   . Constipation   . Nausea    Family History  Problem Relation Age of Onset  . Cancer Mother     colon  . Emphysema Father   . Heart disease Father   . Cancer Father     leukemia  . Cancer Brother     lung  . Cancer Maternal Aunt     melanoma  . Cancer Maternal Grandfather     stomach   Past Surgical History  Procedure Laterality Date  . Foot surgery  04/25/2007    right  . Vaginal hysterectomy      ovaries intact  . Esophagogastroduodenoscopy  10/1997 & 08/2005  . Colonoscopy    . Thyroidectomy  11/2005  . Cardiac catheterization  02/25/2007   Social History   Social History  . Marital Status: Married    Spouse Name: N/A  . Number of Children: 2  . Years of Education: N/A   Occupational History  . [employer]   Social History Main Topics  . Smoking status: Former Smoker -- 0.25 packs/day for 10 years    Types: Cigarettes    Quit date:  09/25/1997  . Smokeless tobacco: Never Used  . Alcohol Use: No  . Drug Use: No  . Sexual Activity:    Partners: Male    Birth Control/ Protection: Surgical   Other Topics Concern  . None   Social History Narrative   Lives with husband in [place], has 2 children and 2 step-children. No pets.      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  Eyes: Negative for visual disturbance.  Respiratory: Negative for shortness of breath.   Cardiovascular: Negative for chest pain and leg swelling.  Gastrointestinal: Negative for nausea, vomiting, abdominal pain, diarrhea and constipation.  Musculoskeletal: Negative for myalgias and arthralgias.  Skin: Negative for color change and rash.  Hematological: Negative for adenopathy. Does not bruise/bleed easily.  Psychiatric/Behavioral: Positive for decreased concentration. Negative for sleep disturbance and dysphoric mood. The patient is nervous/anxious.        Objective:    BP 128/72 mmHg  Pulse 102  Temp(Src) 98.2 F (36.8 C) (  Oral)  Wt 164 lb 8 oz (74.617 kg)  SpO2 100% Physical Exam  Constitutional: She is oriented to person, place, and time. She appears well-developed and well-nourished. No distress.  HENT:  Head: Normocephalic and atraumatic.  Right Ear: External ear normal.  Left Ear: External ear normal.  Nose: Nose normal.  Mouth/Throat: Oropharynx is clear and moist. No oropharyngeal exudate.  Eyes: Conjunctivae are normal. Pupils are equal, round, and reactive to light. Right eye exhibits no discharge. Left eye exhibits no discharge. No scleral icterus.  Neck: Normal range of motion. Neck supple. No tracheal deviation present. No thyromegaly present.  Cardiovascular: Normal rate, regular rhythm, normal heart sounds and intact distal pulses.  Exam reveals no  gallop and no friction rub.   No murmur heard. Pulmonary/Chest: Effort normal and breath sounds normal. No respiratory distress. She has no wheezes. She has no rales. She exhibits no tenderness.  Musculoskeletal: Normal range of motion. She exhibits no edema or tenderness.  Lymphadenopathy:    She has no cervical adenopathy.  Neurological: She is alert and oriented to person, place, and time. No cranial nerve deficit. She exhibits normal muscle tone. Coordination normal.  Skin: Skin is warm and dry. No rash noted. She is not diaphoretic. No erythema. No pallor.  Psychiatric: She has a normal mood and affect. Her speech is normal and behavior is normal. Judgment and thought content normal. Cognition and memory are normal.          Assessment & Plan:  Over 46min of which >50% spent in face-to-face contact with patient discussing plan of care.  Problem List Items Addressed This Visit      Unprioritized   Anxiety - Primary    Anxiety contributing to memory loss. Will start Paxil as this worked well for her in distant past. Discussed several alternative medications and potential side effects. Follow up in 4 weeks.      Relevant Medications   PARoxetine (PAXIL) 10 MG tablet   Hypothyroidism    Recent TSH low at 0.428. Dose of Levothyroxine decreased to 48mcg. Follow up labs pending.      MEMORY LOSS    Recently had evaluation for memory loss. Will request records on this.          Return in about 4 weeks (around 12/09/2015) for Recheck.

## 2015-11-14 ENCOUNTER — Ambulatory Visit
Admission: RE | Admit: 2015-11-14 | Discharge: 2015-11-14 | Disposition: A | Discharge status: Home / Self Care | Payer: Medicare Other | Source: Ambulatory Visit | Attending: Otolaryngology | Admitting: Otolaryngology

## 2015-11-14 DIAGNOSIS — K110 Atrophy of salivary gland: Secondary | ICD-10-CM | POA: Insufficient documentation

## 2015-11-14 DIAGNOSIS — R220 Localized swelling, mass and lump, head: Secondary | ICD-10-CM | POA: Insufficient documentation

## 2015-11-14 DIAGNOSIS — R609 Edema, unspecified: Secondary | ICD-10-CM

## 2015-11-14 DIAGNOSIS — E063 Autoimmune thyroiditis: Secondary | ICD-10-CM | POA: Diagnosis not present

## 2015-11-14 DIAGNOSIS — M3500 Sicca syndrome, unspecified: Secondary | ICD-10-CM | POA: Diagnosis not present

## 2015-11-14 LAB — POCT I-STAT CREATININE: Creatinine, Ser: 0.7 mg/dL (ref 0.44–1.00)

## 2015-11-14 MED ORDER — IOHEXOL 300 MG/ML  SOLN
75.0000 mL | Freq: Once | INTRAMUSCULAR | Status: AC | PRN
  Administered 2015-11-14: 75 mL via INTRAVENOUS

## 2015-11-16 ENCOUNTER — Telehealth: Payer: Self-pay | Admitting: Neurology

## 2015-11-16 DIAGNOSIS — R29898 Other symptoms and signs involving the musculoskeletal system: Secondary | ICD-10-CM

## 2015-11-16 NOTE — Telephone Encounter (Signed)
Pt wants to talk to someone about the test that Dr Tomi Likens wants to have done please call after [phone]

## 2015-11-17 NOTE — Telephone Encounter (Signed)
Order printed and placed in scheduler's box.

## 2015-11-17 NOTE — Telephone Encounter (Signed)
Spoke with patient about EMG. Answered all questions to the best of my ability. Pt would like to go ahead and have EMG scheduled.

## 2015-11-25 ENCOUNTER — Telehealth: Payer: Self-pay | Admitting: Internal Medicine

## 2015-11-25 DIAGNOSIS — D649 Anemia, unspecified: Secondary | ICD-10-CM

## 2015-11-25 DIAGNOSIS — E039 Hypothyroidism, unspecified: Secondary | ICD-10-CM

## 2015-11-25 DIAGNOSIS — E785 Hyperlipidemia, unspecified: Secondary | ICD-10-CM

## 2015-11-25 DIAGNOSIS — E559 Vitamin D deficiency, unspecified: Secondary | ICD-10-CM

## 2015-11-25 NOTE — Telephone Encounter (Signed)
Please advise 

## 2015-11-25 NOTE — Telephone Encounter (Signed)
Pt called would like her lab work done before her appt on 9/13 @ 9:30am. Need orders please and thank you! Call pt @ [phone]. Thank you!

## 2015-12-06 ENCOUNTER — Ambulatory Visit (INDEPENDENT_AMBULATORY_CARE_PROVIDER_SITE_OTHER): Payer: Medicare Other | Admitting: Neurology

## 2015-12-06 ENCOUNTER — Telehealth: Payer: Self-pay

## 2015-12-06 DIAGNOSIS — D329 Benign neoplasm of meninges, unspecified: Secondary | ICD-10-CM

## 2015-12-06 DIAGNOSIS — R29898 Other symptoms and signs involving the musculoskeletal system: Secondary | ICD-10-CM | POA: Diagnosis not present

## 2015-12-06 DIAGNOSIS — R413 Other amnesia: Secondary | ICD-10-CM

## 2015-12-06 DIAGNOSIS — G609 Hereditary and idiopathic neuropathy, unspecified: Secondary | ICD-10-CM

## 2015-12-06 NOTE — Telephone Encounter (Signed)
-----   Message from Pieter Partridge, DO sent at 12/06/2015  3:42 PM EST ----- NCV-EMG was normal.  I would like to get MRI of cervical, thoracic and lumbar spine (without contrast) to evaluate for weakness of extremities

## 2015-12-06 NOTE — Telephone Encounter (Signed)
Message relayed to patient. Verbalized understanding. Patient would like you to look over CT she had done on 1/30. States, "near the end, it talks about the part of the brain Dr. Georgie Chard following up on." Please advise.    MRIs scheduled for 12/17/15 @ 7 am

## 2015-12-06 NOTE — Procedures (Signed)
[facility]  [address]  [place], [place] [address] Tel: [phone] Fax:  [phone] Test Date:  12/06/2015  Patient: Meghan Mack DOB: 03-02-1951 Physician: Narda Amber  Sex: Female Height: 5\' 7"  Ref Phys: Metta Clines, M.D.  ID#: [id number] Temp: 33.6C Technician: Jerilynn Mages. Dean   Patient Complaints: This is a 65 year old female referred for evaluation of bilateral leg weakness.  NCV & EMG Findings: Extensive electrodiagnostic testing of the right lower extremity and additional studies of the left shows: 1. Bilateral sural and superficial peroneal sensory responses are within normal limits. 2. Bilateral peroneal and tibial motor responses are within normal limits. 3. Bilateral tibial H reflex studies are within normal limits. 4. There is no evidence of active or chronic motor axon loss changes affecting any of the tested muscles. Motor unit configuration and recruitment pattern is within normal limits.  Impression: This is a normal study of the lower extremities. In particular, there is no evidence of a diffuse myopathy, lumbosacral radiculopathy, or sensorimotor polyneuropathy.   _____________________________ Narda Amber, D.O.    Nerve Conduction Studies Anti Sensory Summary Table   Stim Site NR Peak (ms) Norm Peak (ms) P-T Amp (V) Norm P-T Amp  Left Sup Peroneal Anti Sensory (Ant Lat Mall)  33.6C  12 cm    3.0 <4.6 10.2 >3  Right Sup Peroneal Anti Sensory (Ant Lat Mall)  33.6C  12 cm    1.9 <4.6 7.0 >3  Left Sural Anti Sensory (Lat Mall)  33.6C  Calf    2.8 <4.6 4.0 >3  Right Sural Anti Sensory (Lat Mall)  33.6C  Calf    3.0 <4.6 4.0 >3   Motor Summary Table   Stim Site NR Onset (ms) Norm Onset (ms) O-P Amp (mV) Norm O-P Amp Site1 Site2 Delta-0 (ms) Dist (cm) Vel (m/s) Norm Vel (m/s)  Left Peroneal Motor (Ext Dig Brev)  33.6C  Ankle    4.1 <6.0 6.6 >2.5 B Fib Ankle 7.2 33.0 46 >40  B Fib    11.3  6.0  Poplt B Fib 1.7 10.0 59 >40  Poplt     13.0  5.8         Right Peroneal Motor (Ext Dig Brev)  33.6C  Ankle    4.1 <6.0 10.4 >2.5 B Fib Ankle 7.1 32.0 45 >40  B Fib    11.2  9.1  Poplt B Fib 2.1 10.0 48 >40  Poplt    13.3  9.0         Right Peroneal TA Motor (Tib Ant)  33.6C  Fib Head    2.8 <4.5 4.1 >3 Poplit Fib Head 1.3 8.0 62 >40  Poplit    4.1  4.0         Left Tibial Motor (Abd Hall Brev)  33.6C  Ankle    4.2 <6.0 9.2 >4 Knee Ankle 8.9 38.0 43 >40  Knee    13.1  9.4         Right Tibial Motor (Abd Hall Brev)  33.6C  Ankle    4.8 <6.0 8.2 >4 Knee Ankle 9.3 40.0 43 >40  Knee    14.1  8.1          H Reflex Studies   NR H-Lat (ms) Lat Norm (ms) L-R H-Lat (ms)  Left Tibial (Gastroc)  33.6C     34.42 <35 0.14  Right Tibial (Gastroc)  33.6C     34.29 <35 0.14   EMG   Side  Muscle Ins Act Fibs Psw Fasc Number Recrt Dur Dur. Amp Amp. Poly Poly. Comment  Right AntTibialis Nml Nml Nml Nml Nml Nml Nml Nml Nml Nml Nml Nml N/A  Right Gastroc Nml Nml Nml Nml Nml Nml Nml Nml Nml Nml Nml Nml N/A  Right Flex Dig Long Nml Nml Nml Nml Nml Nml Nml Nml Nml Nml Nml Nml N/A  Right RectFemoris Nml Nml Nml Nml Nml Nml Nml Nml Nml Nml Nml Nml N/A  Right BicepsFemS Nml Nml Nml Nml Nml Nml Nml Nml Nml Nml Nml Nml N/A  Right GluteusMed Nml Nml Nml Nml Nml Nml Nml Nml Nml Nml Nml Nml N/A  Right Lumbo Parasp Low Nml Nml Nml Nml Nml Nml Nml Nml Nml Nml Nml Nml N/A  Left AntTibialis Nml Nml Nml Nml Nml Nml Nml Nml Nml Nml Nml Nml N/A  Left Gastroc Nml Nml Nml Nml Nml Nml Nml Nml Nml Nml Nml Nml N/A  Left RectFemoris Nml Nml Nml Nml Nml Nml Nml Nml Nml Nml Nml Nml N/A      Waveforms:

## 2015-12-08 ENCOUNTER — Other Ambulatory Visit: Payer: Self-pay | Admitting: Cardiovascular Disease

## 2015-12-09 ENCOUNTER — Ambulatory Visit: Payer: 59 | Admitting: Internal Medicine

## 2015-12-17 ENCOUNTER — Ambulatory Visit
Admission: RE | Admit: 2015-12-17 | Discharge: 2015-12-17 | Disposition: A | Discharge status: Home / Self Care | Payer: Medicare Other | Source: Ambulatory Visit | Attending: Neurology | Admitting: Neurology

## 2015-12-17 DIAGNOSIS — R29898 Other symptoms and signs involving the musculoskeletal system: Secondary | ICD-10-CM

## 2015-12-21 ENCOUNTER — Telehealth: Payer: Self-pay

## 2015-12-21 NOTE — Telephone Encounter (Signed)
Results relayed to pt via mychart

## 2015-12-21 NOTE — Telephone Encounter (Signed)
-----   Message from Pieter Partridge, DO sent at 12/19/2015  7:25 AM EST ----- There are some arthritic changes in the spine (cervical, thoracic and lumbar), but nothing that would cause weakness

## 2015-12-26 ENCOUNTER — Ambulatory Visit: Payer: Medicare Other | Admitting: Internal Medicine

## 2015-12-27 ENCOUNTER — Telehealth: Payer: Self-pay | Admitting: Neurology

## 2015-12-27 DIAGNOSIS — R930 Abnormal findings on diagnostic imaging of skull and head, not elsewhere classified: Secondary | ICD-10-CM

## 2015-12-27 NOTE — Telephone Encounter (Signed)
Attempted to reach again. No VM setup. Will attempt once more.

## 2015-12-27 NOTE — Telephone Encounter (Signed)
Jaffe patient.  

## 2015-12-27 NOTE — Telephone Encounter (Signed)
Pt wants the results of the test done please call [phone]

## 2015-12-27 NOTE — Telephone Encounter (Signed)
Attempted to reach. No answer.  

## 2015-12-28 NOTE — Addendum Note (Signed)
Addended by: Gerda Diss A on: 12/28/2015 02:35 PM   Modules accepted: Orders

## 2015-12-28 NOTE — Telephone Encounter (Signed)
I would like to check another MRI of brain w/wo contrast to evaluate for any change, since she endorses memory loss

## 2015-12-28 NOTE — Telephone Encounter (Signed)
MRI scheduled. Pt aware.

## 2015-12-28 NOTE — Telephone Encounter (Signed)
Due to being unable to reach pt. Will mail letter with MRI reports.

## 2015-12-28 NOTE — Telephone Encounter (Signed)
Pt aware that MRI reports mailed. Would like to know when she should have a repeat MRI for left tentorial Meningioma. Last done in 01/24/14 by Dr. Rita Ohara

## 2016-01-02 ENCOUNTER — Encounter: Payer: Self-pay | Admitting: Internal Medicine

## 2016-01-02 ENCOUNTER — Telehealth: Payer: Self-pay

## 2016-01-02 NOTE — Telephone Encounter (Signed)
It is an incidental finding.  Hemangioma is benign and is different that the meningioma seen in the brain.

## 2016-01-02 NOTE — Telephone Encounter (Signed)
Message relayed to patient. Verbalized understanding and denied questions.   

## 2016-01-02 NOTE — Telephone Encounter (Signed)
Pt calling with questions about imaging. States that on the Thoracic MRI it showed, "Benign-appearing hemangioma in the right side of the T5 vertebral body". Pt states she saw a few other hemagioma  On the other MRIs. Pt would like to know what this means? Does she need to f/u with another specialities? Please advise.

## 2016-01-07 ENCOUNTER — Ambulatory Visit
Admission: RE | Admit: 2016-01-07 | Discharge: 2016-01-07 | Disposition: A | Discharge status: Home / Self Care | Payer: Medicare Other | Source: Ambulatory Visit | Attending: Neurology | Admitting: Neurology

## 2016-01-07 DIAGNOSIS — R930 Abnormal findings on diagnostic imaging of skull and head, not elsewhere classified: Secondary | ICD-10-CM

## 2016-01-07 MED ORDER — GADOBENATE DIMEGLUMINE 529 MG/ML IV SOLN: 20.0000 mL | Freq: Once | INTRAVENOUS | Status: DC | PRN

## 2016-01-07 MED ORDER — GADOBENATE DIMEGLUMINE 529 MG/ML IV SOLN
15.0000 mL | Freq: Once | INTRAVENOUS | Status: AC | PRN
  Administered 2016-01-07: 15 mL via INTRAVENOUS

## 2016-01-09 ENCOUNTER — Telehealth: Payer: Self-pay

## 2016-01-09 NOTE — Telephone Encounter (Signed)
-----   Message from Pieter Partridge, DO sent at 01/09/2016  6:57 AM EDT ----- MRI shows that meningioma is unchanged.  There are no new findings.  I don't think we need to repeat MRIs and further just to monitor the meningioma.

## 2016-01-09 NOTE — Telephone Encounter (Signed)
Results were left on pt's voicemail, with instructions to call back with any questions or concerns in relation to results.   

## 2016-01-19 ENCOUNTER — Ambulatory Visit (INDEPENDENT_AMBULATORY_CARE_PROVIDER_SITE_OTHER): Payer: Medicare Other | Admitting: Neurology

## 2016-01-19 ENCOUNTER — Encounter: Payer: Self-pay | Admitting: Neurology

## 2016-01-19 VITALS — BP 140/76 | HR 78 | Ht 67.0 in | Wt 162.0 lb

## 2016-01-19 DIAGNOSIS — R29898 Other symptoms and signs involving the musculoskeletal system: Secondary | ICD-10-CM | POA: Diagnosis not present

## 2016-01-19 DIAGNOSIS — R41844 Frontal lobe and executive function deficit: Secondary | ICD-10-CM | POA: Diagnosis not present

## 2016-01-19 NOTE — Progress Notes (Signed)
NEUROLOGY FOLLOW UP OFFICE NOTE  Meghan Mack [id number]  HISTORY OF PRESENT ILLNESS: Meghan Mack is a 65 year old right-handed female with COPD, IBS, anxiety, Sjogren's, hypothyroidism and hyperlipidemia who presents for meningioma and memory loss.  History obtained by patient and PCP note.  Labs, NCV-EMG, neuropsychological report, and imaging of brain, cervical, thoracic and lumbar MRI personally reviewed.  UPDATE: Lower Extremity Weakness and intrinsic hand muscle weakness:  CK was 41.  TSH was 0.428 (she has hypothyroidism and had adjustment made to her medication).  NCV-EMG on 12/06/15, which was normal.  MRI of the cervical spine on 12/17/15 was unremarkable.  MRI of thoracic spine showed minimal discogenic edema anteriorly at T10-11 as well as benign hemangioma on the right side of the T5 vertebral body.  MRI of lumbar spine showed shallow broad-based disc herniation at L5-S1, with mild facet degeneration and hypertrophy bilaterally, which potentially could affect S1 nerve root, but no obvious impingement seen.    Meningioma:  MRI of brain with and without contrast from 01/07/16 showed stable sub-centimeter left posterior fossa meningioma.  Memory deficits:  She underwent neuropsychological testing on 10/27/15.  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  She exhibited difficulty performing complex planning/organization, problem solving, and learning, as well as demonstrating moderate motor preservation.  She did not exhibit amnestic memory deficit, but rather it is the executive dysfunction that is impacting her memory.  However, she did not meet criteria for dementia and does not demonstrate evidence for frontotemporal dementia, as she has no reported behavioral or language changes.    She also met criteria for mild generalized anxiety disorder.  HISTORY: She has known anterior left tentorial meningioma since  2008.  For several years, she has been followed by Dr. Sherwood Gambler at [facility].  Observation was recommended.  Most recent MRI of brain available from 01/24/14 showed it to be 9 x 9 x 6 mm, which appears stable compared to prior imaging from 2013, 2011, 2009 and 2008 (measuring around 8 x7 mm to 9 x 6 x 6 mm).  There is no surrounding vasogenic edema.  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  Over the past year, she reports increased memory problems.  When she sees an object, she has trouble remembering the name, although she recognizes it.  This has been happening more frequently.  She also has had trouble recalling names of people she knows and sees frequently, such as the name of her close friend's wife.  On occasion, she has needed to get out the recipe of a cake that she has baked on multiple occasions for years.  Rarely, she may briefly be disoriented while driving on familiar routes.  Recent B12 was 810.  She has hypothyroidism due to surgical resection for Hashimoto's and takes Synthroid.  It is reportedly controlled.  Last TSH from March 2015 was 1.100.   Also, over the past year, she reports falls.  She has tripped and fallen about 4 times.  She says that her right leg just doesn't "want to move".  If she is squatting, she is unable to stand up without pushing off with her arms.  She denies numbness in the feet.  Sometimes, if she clutches something hard or opens up a jar, it is difficult for her to open her hand.  She denies problems swallowing or shortness of breath.  Her mother developed dementia in her late 62s.  She began having gait issues in her 30s or 72s.  Otherwise, she denies other family history of dementia or gait problems.  PAST MEDICAL HISTORY: Past Medical History  Diagnosis Date  . COPD (chronic obstructive pulmonary disease) ([place])   . Colonic polyp   . IBS (irritable bowel syndrome)   . Hyperlipidemia   . Anemia   .  Palpitations   . Constipation   . Nausea     MEDICATIONS: Current Outpatient Prescriptions on File Prior to Visit  Medication Sig Dispense Refill  . acyclovir ointment (ZOVIRAX) 5 % Apply 1 application topically every 3 (three) hours.     . ergocalciferol (VITAMIN D2) 50000 UNITS capsule Take 50,000 Units by mouth every 30 (thirty) days.    Marland Kitchen levothyroxine (SYNTHROID, LEVOTHROID) 88 MCG tablet Take 1 tablet 6 days a week and 1/2 tablet on sunday    . Multiple Vitamin (MULTIVITAMIN PO) Take 1 tablet by mouth daily.      . nebivolol (BYSTOLIC) 5 MG tablet Take 1 tablet (5 mg total) by mouth daily. 90 tablet 3  . ranitidine (ZANTAC) 150 MG tablet Take 150 mg by mouth as needed.      . simvastatin (ZOCOR) 20 MG tablet TAKE ONE TABLET BY MOUTH EACH EVENING. (IMPROVES CHOLESTEROL) 90 tablet 0   No current facility-administered medications on file prior to visit.    ALLERGIES: Allergies  Allergen Reactions  . Tdap [Diphth-Acell Pertussis-Tetanus] Swelling and Rash  . Codeine   . Diflucan [Fluconazole] Hives  . Esomeprazole Magnesium     REACTION: nausea  . Meperidine Hcl     REACTION: Causes heart to race  . Propantheline Bromide   . Propoxyphene N-Acetaminophen     REACTION: heart race  . Sulfonamide Derivatives     FAMILY HISTORY: Family History  Problem Relation Age of Onset  . Cancer Mother     colon  . Emphysema Father   . Heart disease Father   . Cancer Father     leukemia  . Cancer Brother     lung  . Cancer Maternal Aunt     melanoma  . Cancer Maternal Grandfather     stomach    SOCIAL HISTORY: Social History   Social History  . Marital Status: Married    Spouse Name: N/A  . Number of Children: 2  . Years of Education: N/A   Occupational History  . [employer]   Social History Main Topics  . Smoking status: Former Smoker -- 0.25 packs/day for 10 years    Types: Cigarettes    Quit date: 09/25/1997  . Smokeless tobacco: Never Used    . Alcohol Use: No  . Drug Use: No  . Sexual Activity:    Partners: Male    Birth Control/ Protection: Surgical   Other Topics Concern  . Not on file   Social History Narrative   Lives with husband in [place], has 2 children and 2 step-children. No pets.      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      Diet - regular   Exercise - walks 5 days per week   1.5 years of college    REVIEW OF SYSTEMS: Constitutional: No fevers, chills, or sweats, no generalized fatigue, change in appetite Eyes: No visual changes, double vision, eye pain Ear, nose and throat: No hearing loss, ear pain, nasal congestion, sore throat Cardiovascular: No chest pain, palpitations Respiratory:  No shortness of breath at rest or with exertion,  wheezes GastrointestinaI: No nausea, vomiting, diarrhea, abdominal pain, fecal incontinence Genitourinary:  No dysuria, urinary retention or frequency Musculoskeletal:  No neck pain, back pain Integumentary: No rash, pruritus, skin lesions Neurological: as above Psychiatric: No depression, insomnia, anxiety Endocrine: No palpitations, fatigue, diaphoresis, mood swings, change in appetite, change in weight, increased thirst Hematologic/Lymphatic:  No anemia, purpura, petechiae. Allergic/Immunologic: no itchy/runny eyes, nasal congestion, recent allergic reactions, rashes  PHYSICAL EXAM: Filed Vitals:   01/19/16 1056  BP: 140/76  Pulse: 78   General: No acute distress.  Patient appears well-groomed.  normal body habitus. Head:  Normocephalic/atraumatic.  Bilateral temporal wasting Eyes:  Fundoscopic exam unremarkable without vessel changes, exudates, hemorrhages or papilledema. Neck: supple, no paraspinal tenderness, full range of motion Heart:  Regular rate and rhythm Lungs:  Clear to auscultation bilaterally Back: No paraspinal tenderness Neurological Exam: alert and oriented to person, place, and time. Attention span and concentration intact, delayed recall  fair, remote memory intact, fund of knowledge intact.  Speech fluent and not dysarthric, language intact.  Able to draw clock but not complete trail making test or copy cube correctly. Montreal Cognitive Assessment  01/19/2016  Visuospatial/ Executive (0/5) 3  Naming (0/3) 3  Attention: Read list of digits (0/2) 2  Attention: Read list of letters (0/1) 1  Attention: Serial 7 subtraction starting at 100 (0/3) 0  Language: Repeat phrase (0/2) 2  Language : Fluency (0/1) 1  Abstraction (0/2) 2  Delayed Recall (0/5) 3  Orientation (0/6) 6  Total 23  Adjusted Score (based on education) 23   CN II-XII intact. Fundoscopic exam unremarkable without vessel changes, exudates, hemorrhages or papilledema.  Bulk and tone wasting in the intrinsic hand muscles., muscle strength 5-/5 hip flexors, decreased finger tapping on left.  Sensation to pinprick intact.  Decreased vibration sensation in feet.  Deep tendon reflexes 2+ throughout, toes downgoing.  Finger to nose and heel to shin testing intact.  Gait normal, Romberg negative.  IMPRESSION: Executive dysfunction with lower extremity weakness and intrinsic hand muscle weakness.  Testing for neuromuscular disorder has been unremarkable, but still suspected.  She does not exhibit any clinical evidence for frontotemporal dementia.  PLAN: 1.  I would like to refer her to my colleague, Dr. Narda Amber, a neuromuscular specialist, for her impression and any recommended testing. 2.  We may also consider PET scan at some future date to determine if there is any evidence for FTD. 3.  Follow up in 6 months.  28 minutes spent face to face with patient, over 50% spent discussing differential diagnosis, testing results and plan.  Meghan Clines, DO  CC:  Ronette Deter, MD  Narda Amber, MD

## 2016-01-19 NOTE — Patient Instructions (Signed)
1.  I would like to refer you to my colleague, Dr. Narda Amber, for evaluation to help determine what further testing would be necessary to establish a diagnosis 2.  I want to see you back in 6 months.  I believe that Dr. Conley Canal will want to repeat the testing in a few months as well.

## 2016-02-13 ENCOUNTER — Ambulatory Visit: Payer: Medicare Other | Admitting: Neurology

## 2016-02-15 ENCOUNTER — Ambulatory Visit: Payer: Medicare Other | Admitting: Neurology

## 2016-03-01 ENCOUNTER — Encounter: Payer: Self-pay | Admitting: Cardiovascular Disease

## 2016-03-01 ENCOUNTER — Ambulatory Visit (INDEPENDENT_AMBULATORY_CARE_PROVIDER_SITE_OTHER): Payer: Medicare Other | Admitting: Neurology

## 2016-03-01 ENCOUNTER — Other Ambulatory Visit: Payer: Self-pay | Admitting: Cardiovascular Disease

## 2016-03-01 ENCOUNTER — Encounter: Payer: Self-pay | Admitting: Neurology

## 2016-03-01 ENCOUNTER — Ambulatory Visit (INDEPENDENT_AMBULATORY_CARE_PROVIDER_SITE_OTHER): Payer: Medicare Other | Admitting: Cardiovascular Disease

## 2016-03-01 VITALS — BP 122/90 | HR 92 | Ht 68.0 in | Wt 160.2 lb

## 2016-03-01 VITALS — BP 110/74 | HR 102 | Ht 68.0 in | Wt 161.0 lb

## 2016-03-01 DIAGNOSIS — E785 Hyperlipidemia, unspecified: Secondary | ICD-10-CM | POA: Diagnosis not present

## 2016-03-01 DIAGNOSIS — F17200 Nicotine dependence, unspecified, uncomplicated: Secondary | ICD-10-CM | POA: Diagnosis not present

## 2016-03-01 DIAGNOSIS — R29898 Other symptoms and signs involving the musculoskeletal system: Secondary | ICD-10-CM

## 2016-03-01 DIAGNOSIS — R000 Tachycardia, unspecified: Secondary | ICD-10-CM | POA: Diagnosis not present

## 2016-03-01 MED ORDER — PROPRANOLOL HCL 10 MG PO TABS: 10.0000 mg | ORAL_TABLET | Freq: Three times a day (TID) | ORAL | Status: DC | PRN

## 2016-03-01 NOTE — Patient Instructions (Addendum)
There is no evidence of a worrisome neuromuscular condition based on your exam. You can carry a collapsible walking stick with you for situations that you may find yourself in low-seated positions  Follow-up with Dr. Tomi Likens at your schedule appointment

## 2016-03-01 NOTE — Assessment & Plan Note (Signed)
Heart rate typically well controlled. Very rare episodes of palpitations or tachycardia Recommended she take propranolol 10 mg as needed for breakthrough symptoms

## 2016-03-01 NOTE — Assessment & Plan Note (Signed)
Cholesterol is at goal on the current lipid regimen. No changes to the medications were made.  

## 2016-03-01 NOTE — Progress Notes (Signed)
[facility] Clinic Note - Initial Visit   Date: 03/01/2016  Meghan Mack MRN: [id number] DOB: [birth year]   Dear Dr. Gilford Rile:  Thank you for your kind referral of Meghan Mack for consultation of generalized fatigue and weakness. Although her history is well known to you, please allow [place] to reiterate it for the purpose of our medical record. The patient was accompanied to the clinic by self.   History of Present Illness: Meghan Mack is a 65 y.o. right-handed Caucasian female with hypothyroidism, hyperlipidemia, Sjogren's disease, prior tobacco use,  presenting for evaluation of bilateral leg weakness.    Since around ~ 2012, she has noticed difficulty standing from a low position, such as when squatting.  When in low chairs, she often has to use her arms to push off.  Her legs tremble at times, especially after prolonged activity such as walking.  She does not experience any weakness during activity.  She walks about 3-4 times per week for about 30-minutes and had not noticed any changes in her endurance.  She also uses a stationary bike for the past year and is able to bike up to 3 miles.  She is able to do all her usual ADLs.  She does not have double vision, ptosis, difficulty swallowing/talking, climbing stairs, muscle pain, or twitches.  She occasionally has cramps in her toes and legs a few times per month.  No dark, cola colored urine.  Over the past few years, she recalls having a few spells of dropping objects, but this only occurs 2-4 times per year.    Out-side paper records, electronic medical record, and images have been reviewed where available and summarized as:  Labs 07/31/2015:  CK 41, TSH 0.428 Labs 06/27/2015:  Vitamin B12 810, folate >20  MRI brain wwo contrast 01/07/2016: 1. Stable sub-centimeter left posterior fossa meningioma since 2015. As before, minimal mass effect on the left CP angle near the left fifth nerve. 2. Otherwise normal for  age MRI appearance of the brain.   MRI cervical spine wo contrast 12/17/2015: Negative evaluation of the cervical spine. Minimal degenerative changes not likely of clinical significance.  MRI lumbar spine 12/17/2015: L4-5: Bilateral facet degeneration with 3 mm of anterolisthesis. Mild bulging of the disc. No apparent compressive stenosis however. No facet edema. These findings could be associated with low back pain.   L5-S1: Shallow broad-based disc herniation with slight caudal down turning. This contacts the thecal sac in the S1 nerve roots but does not cause visible neural compression. There is mild facet degeneration and hypertrophy bilaterally. These findings could relate to low back pain. There would be some potential for S1 neural irritation, but definite compression is not demonstrated. MRI thoracic spine 12/17/2015:  No advanced or likely significant finding. Mild degenerative disc disease. No neural compression or cord lesion. Minimal discogenic edema anteriorly at the T10-11 level could be associated with lower thoracic back pain.  NCS/EMG of the right side 12/06/2015:  This is a normal study of the lower extremities. In particular, there is no evidence of a diffuse myopathy, lumbosacral radiculopathy, or sensorimotor polyneuropathy.   Past Medical History  Diagnosis Date  . COPD (chronic obstructive pulmonary disease) ([place])   . Colonic polyp   . IBS (irritable bowel syndrome)   . Hyperlipidemia   . Anemia   . Palpitations   . Constipation   . Nausea     Past Surgical History  Procedure Laterality Date  . Foot surgery  04/25/2007    right  . Vaginal hysterectomy      ovaries intact  . Esophagogastroduodenoscopy  10/1997 & 08/2005  . Colonoscopy    . Thyroidectomy  11/2005  . Cardiac catheterization  02/25/2007     Medications:  Outpatient Encounter Prescriptions as of 03/01/2016  Medication Sig Note  . acyclovir ointment (ZOVIRAX) 5 % Apply 1 application topically every  3 (three) hours.  08/31/2013: Received from: External Pharmacy  . ergocalciferol (VITAMIN D2) 50000 UNITS capsule Take 50,000 Units by mouth every 30 (thirty) days.   Marland Kitchen levothyroxine (SYNTHROID, LEVOTHROID) 88 MCG tablet Take 1 tablet 6 days a week.   . Multiple Vitamin (MULTIVITAMIN PO) Take 1 tablet by mouth daily.     . nebivolol (BYSTOLIC) 5 MG tablet Take 1 tablet (5 mg total) by mouth daily.   . propranolol (INDERAL) 10 MG tablet Take 1 tablet (10 mg total) by mouth 3 (three) times daily as needed.   . ranitidine (ZANTAC) 150 MG tablet Take 150 mg by mouth as needed.     . simvastatin (ZOCOR) 20 MG tablet TAKE ONE TABLET BY MOUTH EACH EVENING. (IMPROVES CHOLESTEROL)    No facility-administered encounter medications on file as of 03/01/2016.     Allergies:  Allergies  Allergen Reactions  . Tdap [Diphth-Acell Pertussis-Tetanus] Swelling and Rash  . Codeine   . Diflucan [Fluconazole] Hives  . Esomeprazole Magnesium     REACTION: nausea  . Meperidine Hcl     REACTION: Causes heart to race  . Propantheline Bromide   . Propoxyphene N-Acetaminophen     REACTION: heart race  . Sulfonamide Derivatives     Family History: Family History  Problem Relation Age of Onset  . Cancer Mother     colon  . Emphysema Father   . Heart disease Father   . Cancer Father     leukemia  . Cancer Brother     lung  . Cancer Maternal Aunt     melanoma  . Cancer Maternal Grandfather     stomach    Social History: Social History  Substance Use Topics  . Smoking status: Former Smoker -- 0.25 packs/day for 10 years    Types: Cigarettes    Quit date: 09/25/1997  . Smokeless tobacco: Never Used  . Alcohol Use: No   Social History   Social History Narrative   Lives with husband in [place], has 2 children and 2 step-children. No pets.      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       Diet - regular   Exercise - walks 5 days per week   1.5 years of college    Review  of Systems:  CONSTITUTIONAL: No fevers, chills, night sweats, or weight loss.   EYES: No visual changes or eye pain ENT: No hearing changes.  No history of nose bleeds.   RESPIRATORY: No cough, wheezing and shortness of breath.   CARDIOVASCULAR: Negative for chest pain, and palpitations.   GI: Negative for abdominal discomfort, blood in stools or black stools.  No recent change in bowel habits.   GU:  No history of incontinence.   MUSCLOSKELETAL: No history of joint pain or swelling.  No myalgias.   SKIN: Negative for lesions, rash, and itching.   HEMATOLOGY/ONCOLOGY: Negative for prolonged bleeding, bruising easily, and swollen nodes.  No history of cancer.   ENDOCRINE: Negative for cold or heat intolerance, polydipsia or goiter.   PSYCH:  No  depression or anxiety symptoms.   NEURO: As Above.   Vital Signs:  BP 110/74 mmHg  Pulse 102  Ht 5\' 8"  (1.727 m)  Wt 161 lb (73.029 kg)  BMI 24.49 kg/m2  SpO2 96% Pain Scale: 0 on a scale of 0-10   General Medical Exam:   General:  Well appearing, comfortable.   Eyes/ENT: see cranial nerve examination.   Neck: No masses appreciated.  Full range of motion without tenderness.  No carotid bruits. Respiratory:  Clear to auscultation, good air entry bilaterally.   Cardiac:  Regular rate and rhythm, no murmur.   Extremities:  No deformities, edema, or skin discoloration.  Skin:  No rashes or lesions.  Neurological Exam: MENTAL STATUS including orientation to time, place, person, recent and remote memory, attention span and concentration, language, and fund of knowledge is normal.  Speech is not dysarthric.  CRANIAL NERVES: II:  No visual field defects.  Unremarkable fundi.   III-IV-VI: Pupils equal round and reactive to light.  Normal conjugate, extra-ocular eye movements in all directions of gaze.  No nystagmus.  Subtle right ptosis without worsening with sustained upgaze.  V:  Normal facial sensation.   VII:  Normal facial symmetry and  movements.    VIII:  Normal hearing and vestibular function.   IX-X:  Normal palatal movement.   XI:  Normal shoulder shrug and head rotation.   XII:  Normal tongue strength and range of motion, no deviation or fasciculation.  MOTOR:  No atrophy, mild age-related loss of interosseus muscle bulk.  No fasciculations or abnormal movements.  No pronator drift.  Tone is normal.  There is no muscle fatigability on exam.  Neck flexion and extension is 5/5.  Right Upper Extremity:    Left Upper Extremity:    Deltoid  5/5   Deltoid  5/5   Biceps  5/5   Biceps  5/5   Triceps  5/5   Triceps  5/5   Wrist extensors  5/5   Wrist extensors  5/5   Wrist flexors  5/5   Wrist flexors  5/5   Finger extensors  5/5   Finger extensors  5/5   Finger flexors  5/5   Finger flexors  5/5   Dorsal interossei  5/5   Dorsal interossei  5/5   Abductor pollicis  5/5   Abductor pollicis  5/5   Tone (Ashworth scale)  0  Tone (Ashworth scale)  0   Right Lower Extremity:    Left Lower Extremity:    Hip flexors  5/5   Hip flexors  5/5   Hip extensors  5/5   Hip extensors  5/5   Knee flexors  5/5   Knee flexors  5/5   Knee extensors  5/5   Knee extensors  5/5   Dorsiflexors  5/5   Dorsiflexors  5/5   Plantarflexors  5/5   Plantarflexors  5/5   Toe extensors  5/5   Toe extensors  5/5   Toe flexors  5/5   Toe flexors  5/5   Tone (Ashworth scale)  0  Tone (Ashworth scale)  0   MSRs:  Right  Left brachioradialis 2+  brachioradialis 2+  biceps 2+  biceps 2+  triceps 2+  triceps 2+  patellar 2+  patellar 2+  ankle jerk 2+  ankle jerk 2+  Hoffman no  Hoffman no  plantar response down  plantar response down   SENSORY:  Normal and symmetric perception of light touch, pinprick, vibration, and proprioception.  Romberg's sign absent.   COORDINATION/GAIT: Normal finger-to- nose-finger and heel-to-shin.  Intact rapid alternating movements bilaterally.  Able to  rise from a chair without using arms. She is able to perform a squat and strand to rise without assistance.  Gait narrow based and stable. Tandem and stressed gait intact.    IMPRESSION: Mrs. Austad is a 65 year-old female with history of Sjogren's syndrome referred for a second opinion regarding bilateral leg weakness.  I have reviewed her previous work-up which included MRI of the neuroaxis, NCS/EMG, and serology testing (CK, TSH, vitamin B12, folate).  She has some degenerative changes of the spine without nerve impingement. Her NCS/EMG was entirely normal, without evidence of myopathy, neuropathy, or radiculopathy.  I reassured her that her neuromuscular exam today is normal.  She has mild right ptosis, which is likely old.  There is no muscle fatigability or weakness.  I do not see any evidence of motor neuron disease or myasthenia gravis.  Certainly, if symptoms were to worsen in the future additional serology testing such as AChR antibody testing can be considered.  From a symptomatic standpoint, I recommended that she continue her exercise program and to carry a collapsible walking stick to help in situations where she is in a low-seated position.   She will follow-up with Dr. Tomi Likens.      The duration of this appointment visit was 40 minutes of face-to-face time with the patient.  Greater than 50% of this time was spent in counseling, explanation of diagnosis, planning of further management, and coordination of care.   Thank you for allowing me to participate in patient's care.  If I can answer any additional questions, I would be pleased to do so.    Sincerely,    Donika K. Posey Pronto, DO

## 2016-03-01 NOTE — Progress Notes (Signed)
Patient ID: Meghan Mack, female    DOB: 05/15/1951, 65 y.o.   MRN: [id number]  HPI Comments: Meghan Mack is a very pleasant 65 year old woman with a history of palpitations, anxiety, remote history of chest pain, hyperlipidemia and hypertension with a strong family history of coronary artery disease who presents for routine followup  Of her palpitations and tachycardia. Prior 30 day monitor in 2014 showed APCs and short runs of atrial tachycardia/SVT with rate 166 bpm Prior history of anxiety   in follow-up today, she reports that she is trying to be more active, walking more  she has noticed some strengthening in her legs , previously were very weak  Denies any significant tachycardia or palpitations.  Continues to take low-dose beta blocker bystolic  2.5 mg  daily, occasionally with extra dosing for breakthrough palpitations .   she's been active, camping, hiking Tolerating simvastatin  EKG on today's visit shows normal sinus rhythm with rate 92 bpm, no significant ST or T-wave changes        Allergies  Allergen Reactions  . Tdap [Diphth-Acell Pertussis-Tetanus] Swelling and Rash  . Codeine   . Diflucan [Fluconazole] Hives  . Esomeprazole Magnesium     REACTION: nausea  . Meperidine Hcl     REACTION: Causes heart to race  . Propantheline Bromide   . Propoxyphene N-Acetaminophen     REACTION: heart race  . Sulfonamide Derivatives     Current Outpatient Prescriptions on File Prior to Visit  Medication Sig Dispense Refill  . acyclovir ointment (ZOVIRAX) 5 % Apply 1 application topically every 3 (three) hours.     . ergocalciferol (VITAMIN D2) 50000 UNITS capsule Take 50,000 Units by mouth every 30 (thirty) days.    Marland Kitchen levothyroxine (SYNTHROID, LEVOTHROID) 88 MCG tablet Take 1 tablet 6 days a week.    . Multiple Vitamin (MULTIVITAMIN PO) Take 1 tablet by mouth daily.      . nebivolol (BYSTOLIC) 5 MG tablet Take 1 tablet (5 mg total) by mouth daily. 90 tablet 3  .  ranitidine (ZANTAC) 150 MG tablet Take 150 mg by mouth as needed.      . simvastatin (ZOCOR) 20 MG tablet TAKE ONE TABLET BY MOUTH EACH EVENING. (IMPROVES CHOLESTEROL) 90 tablet 0   No current facility-administered medications on file prior to visit.    Past Medical History  Diagnosis Date  . COPD (chronic obstructive pulmonary disease) ([place])   . Colonic polyp   . IBS (irritable bowel syndrome)   . Hyperlipidemia   . Anemia   . Palpitations   . Constipation   . Nausea     Past Surgical History  Procedure Laterality Date  . Foot surgery  04/25/2007    right  . Vaginal hysterectomy      ovaries intact  . Esophagogastroduodenoscopy  10/1997 & 08/2005  . Colonoscopy    . Thyroidectomy  11/2005  . Cardiac catheterization  02/25/2007    Social History  reports that she quit smoking about 18 years ago. Her smoking use included Cigarettes. She has a 2.5 pack-year smoking history. She has never used smokeless tobacco. She reports that she does not drink alcohol or use illicit drugs.  Family History family history includes Cancer in her brother, father, maternal aunt, maternal grandfather, and mother; Emphysema in her father; Heart disease in her father.       Review of Systems  Constitutional: Negative.   Respiratory: Negative.   Cardiovascular: Negative.   Gastrointestinal: Negative.   Musculoskeletal:  Negative.   Neurological: Negative.   Hematological: Negative.   Psychiatric/Behavioral: Negative.   All other systems reviewed and are negative.   BP 122/90 mmHg  Pulse 92  Ht 5\' 8"  (1.727 m)  Wt 160 lb 4 oz (72.689 kg)  BMI 24.37 kg/m2  Physical Exam  Constitutional: She is oriented to person, place, and time. She appears well-developed and well-nourished.  HENT:  Head: Normocephalic.  Nose: Nose normal.  Mouth/Throat: Oropharynx is clear and moist.  Eyes: Conjunctivae are normal. Pupils are equal, round, and reactive to light.  Neck: Normal range of motion.  Neck supple. No JVD present.  Cardiovascular: Normal rate, regular rhythm, S1 normal, S2 normal, normal heart sounds and intact distal pulses.  Exam reveals no gallop and no friction rub.   No murmur heard. Pulmonary/Chest: Effort normal and breath sounds normal. No respiratory distress. She has no wheezes. She has no rales. She exhibits no tenderness.  Abdominal: Soft. Bowel sounds are normal. She exhibits no distension. There is no tenderness.  Musculoskeletal: Normal range of motion. She exhibits no edema or tenderness.  Lymphadenopathy:    She has no cervical adenopathy.  Neurological: She is alert and oriented to person, place, and time. Coordination normal.  Skin: Skin is warm and dry. No rash noted. No erythema.  Psychiatric: She has a normal mood and affect. Her behavior is normal. Judgment and thought content normal.    Assessment and Plan  Nursing note and vitals reviewed.

## 2016-03-01 NOTE — Patient Instructions (Signed)
You are doing well. No medication changes were made.  Please call us if you have new issues that need to be addressed before your next appt.  Your physician wants you to follow-up in: 12 months.  You will receive a reminder letter in the mail two months in advance. If you don't receive a letter, please call our office to schedule the follow-up appointment. 

## 2016-03-01 NOTE — Assessment & Plan Note (Signed)
Stop smoking many years ago

## 2016-04-10 ENCOUNTER — Telehealth: Payer: Self-pay | Admitting: Internal Medicine

## 2016-04-10 NOTE — Telephone Encounter (Signed)
Filled out form for her to pick up

## 2016-04-10 NOTE — Telephone Encounter (Signed)
Please advise for labs? thanks

## 2016-04-10 NOTE — Telephone Encounter (Signed)
Pt has a physical with Dr. Gilford Rile on 05/17/16. She would like to have her labs done at [facility] before she comes. Please advise pt.

## 2016-04-11 NOTE — Telephone Encounter (Signed)
Meghan Mack did you receive this form and is it up front for pick up?

## 2016-04-12 NOTE — Telephone Encounter (Signed)
Patient requested it be mailed and has been mailed out. Should be delivered today or tomorrow per patient.

## 2016-05-08 ENCOUNTER — Other Ambulatory Visit: Payer: Self-pay | Admitting: Internal Medicine

## 2016-05-09 LAB — COMPREHENSIVE METABOLIC PANEL
ALT: 31 IU/L (ref 0–32)
AST: 30 IU/L (ref 0–40)
Albumin/Globulin Ratio: 1.5 (ref 1.2–2.2)
Albumin: 4 g/dL (ref 3.6–4.8)
Alkaline Phosphatase: 116 IU/L (ref 39–117)
BUN/Creatinine Ratio: 9 — ABNORMAL LOW (ref 12–28)
BUN: 7 mg/dL — ABNORMAL LOW (ref 8–27)
Bilirubin Total: 0.5 mg/dL (ref 0.0–1.2)
CO2: 23 mmol/L (ref 18–29)
Calcium: 9.1 mg/dL (ref 8.7–10.3)
Chloride: 99 mmol/L (ref 96–106)
Creatinine, Ser: 0.77 mg/dL (ref 0.57–1.00)
GFR calc Af Amer: 93 mL/min/{1.73_m2} (ref >59)
GFR calc non Af Amer: 81 mL/min/{1.73_m2} (ref >59)
Globulin, Total: 2.6 g/dL (ref 1.5–4.5)
Glucose: 88 mg/dL (ref 65–99)
Potassium: 4.2 mmol/L (ref 3.5–5.2)
Sodium: 138 mmol/L (ref 134–144)
Total Protein: 6.6 g/dL (ref 6.0–8.5)

## 2016-05-09 LAB — CBC WITH DIFFERENTIAL/PLATELET
Basophils Absolute: 0 10*3/uL (ref 0.0–0.2)
Basos: 0 %
EOS (ABSOLUTE): 0.1 10*3/uL (ref 0.0–0.4)
Eos: 2 %
Hematocrit: 38.2 % (ref 34.0–46.6)
Hemoglobin: 13.2 g/dL (ref 11.1–15.9)
Immature Grans (Abs): 0 10*3/uL (ref 0.0–0.1)
Immature Granulocytes: 0 %
Lymphocytes Absolute: 1.1 10*3/uL (ref 0.7–3.1)
Lymphs: 26 %
MCH: 32.9 pg (ref 26.6–33.0)
MCHC: 34.6 g/dL (ref 31.5–35.7)
MCV: 95 fL (ref 79–97)
Monocytes Absolute: 0.5 10*3/uL (ref 0.1–0.9)
Monocytes: 11 %
Neutrophils Absolute: 2.7 10*3/uL (ref 1.4–7.0)
Neutrophils: 61 %
Platelets: 186 10*3/uL (ref 150–379)
RBC: 4.01 x10E6/uL (ref 3.77–5.28)
RDW: 12.7 % (ref 12.3–15.4)
WBC: 4.4 10*3/uL (ref 3.4–10.8)

## 2016-05-09 LAB — LIPID PANEL W/O CHOL/HDL RATIO
Cholesterol, Total: 161 mg/dL (ref 100–199)
HDL: 76 mg/dL (ref >39)
LDL Calculated: 70 mg/dL (ref 0–99)
Triglycerides: 75 mg/dL (ref 0–149)
VLDL Cholesterol Cal: 15 mg/dL (ref 5–40)

## 2016-05-09 LAB — TSH: TSH: 1.89 u[IU]/mL (ref 0.450–4.500)

## 2016-05-09 LAB — VITAMIN D 25 HYDROXY (VIT D DEFICIENCY, FRACTURES): Vit D, 25-Hydroxy: 41.7 ng/mL (ref 30.0–100.0)

## 2016-05-09 LAB — HGB A1C W/O EAG: Hgb A1c MFr Bld: 5.4 % (ref 4.8–5.6)

## 2016-05-16 ENCOUNTER — Ambulatory Visit (INDEPENDENT_AMBULATORY_CARE_PROVIDER_SITE_OTHER): Payer: Medicare Other | Admitting: Internal Medicine

## 2016-05-16 ENCOUNTER — Encounter: Payer: Self-pay | Admitting: Internal Medicine

## 2016-05-16 VITALS — BP 152/88 | HR 91 | Temp 98.4°F | Ht 66.75 in | Wt 161.6 lb

## 2016-05-16 DIAGNOSIS — Z87891 Personal history of nicotine dependence: Secondary | ICD-10-CM

## 2016-05-16 DIAGNOSIS — Z0000 Encounter for general adult medical examination without abnormal findings: Secondary | ICD-10-CM | POA: Insufficient documentation

## 2016-05-16 LAB — POCT URINALYSIS DIPSTICK
Bilirubin, UA: NEGATIVE
Blood, UA: NEGATIVE
Glucose, UA: NEGATIVE
Ketones, UA: NEGATIVE
Leukocytes, UA: NEGATIVE
Nitrite, UA: NEGATIVE
Protein, UA: NEGATIVE
Spec Grav, UA: 1.01
Urobilinogen, UA: 0.2
pH, UA: 7

## 2016-05-16 NOTE — Progress Notes (Signed)
Pre visit review using our clinic review tool, if applicable. No additional management support is needed unless otherwise documented below in the visit note. 

## 2016-05-16 NOTE — Patient Instructions (Signed)
Health Maintenance, Female Adopting a healthy lifestyle and getting preventive care can go a long way to promote health and wellness. Talk with your health care provider about what schedule of regular examinations is right for you. This is a good chance for you to check in with your provider about disease prevention and staying healthy. In between checkups, there are plenty of things you can do on your own. Experts have done a lot of research about which lifestyle changes and preventive measures are most likely to keep you healthy. Ask your health care provider for more information. WEIGHT AND DIET  Eat a healthy diet  Be sure to include plenty of vegetables, fruits, low-fat dairy products, and lean protein.  Do not eat a lot of foods high in solid fats, added sugars, or salt.  Get regular exercise. This is one of the most important things you can do for your health.  Most adults should exercise for at least 150 minutes each week. The exercise should increase your heart rate and make you sweat (moderate-intensity exercise).  Most adults should also do strengthening exercises at least twice a week. This is in addition to the moderate-intensity exercise.  Maintain a healthy weight  Body mass index (BMI) is a measurement that can be used to identify possible weight problems. It estimates body fat based on height and weight. Your health care provider can help determine your BMI and help you achieve or maintain a healthy weight.  For females 20 years of age and older:   A BMI below 18.5 is considered underweight.  A BMI of 18.5 to 24.9 is normal.  A BMI of 25 to 29.9 is considered overweight.  A BMI of 30 and above is considered obese.  Watch levels of cholesterol and blood lipids  You should start having your blood tested for lipids and cholesterol at 66 years of age, then have this test every 5 years.  You may need to have your cholesterol levels checked more often if:  Your lipid  or cholesterol levels are high.  You are older than 66 years of age.  You are at high risk for heart disease.  CANCER SCREENING   Lung Cancer  Lung cancer screening is recommended for adults 55-80 years old who are at high risk for lung cancer because of a history of smoking.  A yearly low-dose CT scan of the lungs is recommended for people who:  Currently smoke.  Have quit within the past 15 years.  Have at least a 30-pack-year history of smoking. A pack year is smoking an average of one pack of cigarettes a day for 1 year.  Yearly screening should continue until it has been 15 years since you quit.  Yearly screening should stop if you develop a health problem that would prevent you from having lung cancer treatment.  Breast Cancer  Practice breast self-awareness. This means understanding how your breasts normally appear and feel.  It also means doing regular breast self-exams. Let your health care provider know about any changes, no matter how small.  If you are in your 20s or 30s, you should have a clinical breast exam (CBE) by a health care provider every 1-3 years as part of a regular health exam.  If you are 40 or older, have a CBE every year. Also consider having a breast X-ray (mammogram) every year.  If you have a family history of breast cancer, talk to your health care provider about genetic screening.  If you   are at high risk for breast cancer, talk to your health care provider about having an MRI and a mammogram every year.  Breast cancer gene (BRCA) assessment is recommended for women who have family members with BRCA-related cancers. BRCA-related cancers include:  Breast.  Ovarian.  Tubal.  Peritoneal cancers.  Results of the assessment will determine the need for genetic counseling and BRCA1 and BRCA2 testing. Cervical Cancer Your health care provider may recommend that you be screened regularly for cancer of the pelvic organs (ovaries, uterus, and  vagina). This screening involves a pelvic examination, including checking for microscopic changes to the surface of your cervix (Pap test). You may be encouraged to have this screening done every 3 years, beginning at age 21.  For women ages 30-65, health care providers may recommend pelvic exams and Pap testing every 3 years, or they may recommend the Pap and pelvic exam, combined with testing for human papilloma virus (HPV), every 5 years. Some types of HPV increase your risk of cervical cancer. Testing for HPV may also be done on women of any age with unclear Pap test results.  Other health care providers may not recommend any screening for nonpregnant women who are considered low risk for pelvic cancer and who do not have symptoms. Ask your health care provider if a screening pelvic exam is right for you.  If you have had past treatment for cervical cancer or a condition that could lead to cancer, you need Pap tests and screening for cancer for at least 20 years after your treatment. If Pap tests have been discontinued, your risk factors (such as having a new sexual partner) need to be reassessed to determine if screening should resume. Some women have medical problems that increase the chance of getting cervical cancer. In these cases, your health care provider may recommend more frequent screening and Pap tests. Colorectal Cancer  This type of cancer can be detected and often prevented.  Routine colorectal cancer screening usually begins at 66 years of age and continues through 66 years of age.  Your health care provider may recommend screening at an earlier age if you have risk factors for colon cancer.  Your health care provider may also recommend using home test kits to check for hidden blood in the stool.  A small camera at the end of a tube can be used to examine your colon directly (sigmoidoscopy or colonoscopy). This is done to check for the earliest forms of colorectal  cancer.  Routine screening usually begins at age 50.  Direct examination of the colon should be repeated every 5-10 years through 66 years of age. However, you may need to be screened more often if early forms of precancerous polyps or small growths are found. Skin Cancer  Check your skin from head to toe regularly.  Tell your health care provider about any new moles or changes in moles, especially if there is a change in a mole's shape or color.  Also tell your health care provider if you have a mole that is larger than the size of a pencil eraser.  Always use sunscreen. Apply sunscreen liberally and repeatedly throughout the day.  Protect yourself by wearing long sleeves, pants, a wide-brimmed hat, and sunglasses whenever you are outside. HEART DISEASE, DIABETES, AND HIGH BLOOD PRESSURE   High blood pressure causes heart disease and increases the risk of stroke. High blood pressure is more likely to develop in:  People who have blood pressure in the high end   of the normal range (130-139/85-89 mm Hg).  People who are overweight or obese.  People who are African American.  If you are 38-23 years of age, have your blood pressure checked every 3-5 years. If you are 61 years of age or older, have your blood pressure checked every year. You should have your blood pressure measured twice--once when you are at a hospital or clinic, and once when you are not at a hospital or clinic. Record the average of the two measurements. To check your blood pressure when you are not at a hospital or clinic, you can use:  An automated blood pressure machine at a pharmacy.  A home blood pressure monitor.  If you are between 45 years and 39 years old, ask your health care provider if you should take aspirin to prevent strokes.  Have regular diabetes screenings. This involves taking a blood sample to check your fasting blood sugar level.  If you are at a normal weight and have a low risk for diabetes,  have this test once every three years after 66 years of age.  If you are overweight and have a high risk for diabetes, consider being tested at a younger age or more often. PREVENTING INFECTION  Hepatitis B  If you have a higher risk for hepatitis B, you should be screened for this virus. You are considered at high risk for hepatitis B if:  You were born in a country where hepatitis B is common. Ask your health care provider which countries are considered high risk.  Your parents were born in a high-risk country, and you have not been immunized against hepatitis B (hepatitis B vaccine).  You have HIV or AIDS.  You use needles to inject street drugs.  You live with someone who has hepatitis B.  You have had sex with someone who has hepatitis B.  You get hemodialysis treatment.  You take certain medicines for conditions, including cancer, organ transplantation, and autoimmune conditions. Hepatitis C  Blood testing is recommended for:  Everyone born from [birth year] through 1966.  Anyone with known risk factors for hepatitis C. Sexually transmitted infections (STIs)  You should be screened for sexually transmitted infections (STIs) including gonorrhea and chlamydia if:  You are sexually active and are younger than 66 years of age.  You are older than 66 years of age and your health care provider tells you that you are at risk for this type of infection.  Your sexual activity has changed since you were last screened and you are at an increased risk for chlamydia or gonorrhea. Ask your health care provider if you are at risk.  If you do not have HIV, but are at risk, it may be recommended that you take a prescription medicine daily to prevent HIV infection. This is called pre-exposure prophylaxis (PrEP). You are considered at risk if:  You are sexually active and do not regularly use condoms or know the HIV status of your partner(s).  You take drugs by injection.  You are sexually  active with a partner who has HIV. Talk with your health care provider about whether you are at high risk of being infected with HIV. If you choose to begin PrEP, you should first be tested for HIV. You should then be tested every 3 months for as long as you are taking PrEP.  PREGNANCY   If you are premenopausal and you may become pregnant, ask your health care provider about preconception counseling.  If you may  become pregnant, take 400 to 800 micrograms (mcg) of folic acid every day.  If you want to prevent pregnancy, talk to your health care provider about birth control (contraception). OSTEOPOROSIS AND MENOPAUSE   Osteoporosis is a disease in which the bones lose minerals and strength with aging. This can result in serious bone fractures. Your risk for osteoporosis can be identified using a bone density scan.  If you are 61 years of age or older, or if you are at risk for osteoporosis and fractures, ask your health care provider if you should be screened.  Ask your health care provider whether you should take a calcium or vitamin D supplement to lower your risk for osteoporosis.  Menopause may have certain physical symptoms and risks.  Hormone replacement therapy may reduce some of these symptoms and risks. Talk to your health care provider about whether hormone replacement therapy is right for you.  HOME CARE INSTRUCTIONS   Schedule regular health, dental, and eye exams.  Stay current with your immunizations.   Do not use any tobacco products including cigarettes, chewing tobacco, or electronic cigarettes.  If you are pregnant, do not drink alcohol.  If you are breastfeeding, limit how much and how often you drink alcohol.  Limit alcohol intake to no more than 1 drink per day for nonpregnant women. One drink equals 12 ounces of beer, 5 ounces of wine, or 1 ounces of hard liquor.  Do not use street drugs.  Do not share needles.  Ask your health care provider for help if  you need support or information about quitting drugs.  Tell your health care provider if you often feel depressed.  Tell your health care provider if you have ever been abused or do not feel safe at home.   This information is not intended to replace advice given to you by your health care provider. Make sure you discuss any questions you have with your health care provider.   Document Released: 04/16/2011 Document Revised: 10/22/2014 Document Reviewed: 09/02/2013 Elsevier Interactive Patient Education [employer].

## 2016-05-16 NOTE — Progress Notes (Signed)
Subjective:    Patient ID: Meghan Mack, female    DOB: 13-Dec-1950, 65 y.o.   MRN: [id number]  HPI  65YO female presents for annual exam.  Mammogram UTD 09/2015 PAP performed by Dr. Raphael Gibney Colonoscopy UTD 2015  Checks BP at home, mostly 100s/60s.  Former smoker. Smoked for 40 years about 1PPD.  Wt Readings from Last 3 Encounters:  05/16/16 161 lb 9.6 oz (73.3 kg)  03/01/16 161 lb (73 kg)  03/01/16 160 lb 4 oz (72.7 kg)   BP Readings from Last 3 Encounters:  05/16/16 (!) 152/88  03/01/16 110/74  03/01/16 122/90    Past Medical History:  Diagnosis Date  . Anemia   . Colonic polyp   . Constipation   . COPD (chronic obstructive pulmonary disease) ([place])   . Hyperlipidemia   . IBS (irritable bowel syndrome)   . Nausea   . Palpitations    Family History  Problem Relation Age of Onset  . Cancer Mother     colon  . Emphysema Father   . Heart disease Father   . Cancer Father     leukemia  . Cancer Brother     lung  . Cancer Maternal Aunt     melanoma  . Cancer Maternal Grandfather     stomach   Past Surgical History:  Procedure Laterality Date  . CARDIAC CATHETERIZATION  02/25/2007  . COLONOSCOPY    . ESOPHAGOGASTRODUODENOSCOPY  10/1997 & 08/2005  . FOOT SURGERY  04/25/2007   right  . THYROIDECTOMY  11/2005  . VAGINAL HYSTERECTOMY     ovaries intact   Social History   Social History  . Marital status: Married    Spouse name: N/A  . Number of children: 2  . Years of education: N/A   Occupational History  . [employer]   Social History Main Topics  . Smoking status: Former Smoker    Packs/day: 0.25    Years: 10.00    Types: Cigarettes    Quit date: 09/25/1997  . Smokeless tobacco: Never Used  . Alcohol use No  . Drug use: No  . Sexual activity: Yes    Partners: Male    Birth control/ protection: Surgical   Other Topics Concern  . None   Social History Narrative   Lives with husband in [place], has 2 children and  2 step-children. No pets.      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       Diet - regular   Exercise - walks 5 days per week   1.5 years of college    Review of Systems  Constitutional: Negative for appetite change, chills, fatigue, fever and unexpected weight change.  Eyes: Negative for visual disturbance.  Respiratory: Negative for cough, shortness of breath and wheezing.   Cardiovascular: Negative for chest pain, palpitations and leg swelling.  Gastrointestinal: Negative for abdominal pain, constipation, diarrhea, nausea and vomiting.  Musculoskeletal: Negative for arthralgias and myalgias.  Skin: Negative for color change and rash.  Neurological: Negative for weakness.  Hematological: Negative for adenopathy. Does not bruise/bleed easily.  Psychiatric/Behavioral: Negative for dysphoric mood and sleep disturbance. The patient is not nervous/anxious.        Objective:    BP (!) 152/88 (BP Location: Right Arm, Patient Position: Sitting, Cuff Size: Large)   Pulse 91   Temp 98.4 F (36.9 C) (Oral)   Ht 5' 6.75" (1.695 m)   Wt 161 lb 9.6  oz (73.3 kg)   SpO2 99%   BMI 25.50 kg/m  Physical Exam  Constitutional: She is oriented to person, place, and time. She appears well-developed and well-nourished. No distress.  HENT:  Head: Normocephalic and atraumatic.  Right Ear: External ear normal.  Left Ear: External ear normal.  Nose: Nose normal.  Mouth/Throat: Oropharynx is clear and moist. No oropharyngeal exudate.  Eyes: Conjunctivae are normal. Pupils are equal, round, and reactive to light. Right eye exhibits no discharge. Left eye exhibits no discharge. No scleral icterus.  Neck: Normal range of motion. Neck supple. No tracheal deviation present. No thyromegaly present.  Cardiovascular: Normal rate, regular rhythm, normal heart sounds and intact distal pulses.  Exam reveals no gallop and no friction rub.   No murmur heard. Pulmonary/Chest: Effort normal and  breath sounds normal. No accessory muscle usage. No tachypnea. No respiratory distress. She has no decreased breath sounds. She has no wheezes. She has no rales. She exhibits no tenderness. Right breast exhibits no inverted nipple, no mass, no nipple discharge, no skin change and no tenderness. Left breast exhibits no inverted nipple, no mass, no nipple discharge, no skin change and no tenderness. Breasts are symmetrical.  Abdominal: Soft. Bowel sounds are normal. She exhibits no distension and no mass. There is no tenderness. There is no rebound and no guarding.  Musculoskeletal: Normal range of motion. She exhibits no edema or tenderness.  Lymphadenopathy:    She has no cervical adenopathy.  Neurological: She is alert and oriented to person, place, and time. No cranial nerve deficit. She exhibits normal muscle tone. Coordination normal.  Skin: Skin is warm and dry. No rash noted. She is not diaphoretic. No erythema. No pallor.  Psychiatric: She has a normal mood and affect. Her behavior is normal. Judgment and thought content normal.          Assessment & Plan:   Problem List Items Addressed This Visit      Unprioritized   Routine general medical examination at a health care facility - Primary    General medical exam normal today including breast exam. Mammogram UTD. Colonoscopy UTD. PAP is scheduled with OB. Immunizations are UTD. Labs reviewed in detail with pt. Encouraged healthy diet and exercise. Given that she is a former smoker, placed referral for CT screening for lung cancer.      Relevant Orders   POCT Urinalysis Dipstick (Completed)    Other Visit Diagnoses    Former smoker       Relevant Orders   CT CHEST LUNG CA SCREEN LOW DOSE W/O CM       Return in about 3 months (around 08/16/2016) for New Patient.  Ronette Deter, MD Internal Medicine [facility]

## 2016-05-16 NOTE — Assessment & Plan Note (Addendum)
General medical exam normal today including breast exam. Mammogram UTD. Colonoscopy UTD. PAP is scheduled with OB. Immunizations are UTD. Labs reviewed in detail with pt. Encouraged healthy diet and exercise. Given that she is a former smoker, placed referral for CT screening for lung cancer.

## 2016-05-18 ENCOUNTER — Telehealth: Payer: Self-pay | Admitting: *Deleted

## 2016-05-18 NOTE — Telephone Encounter (Signed)
Received referral for initial lung cancer screening scan. Contacted patient and obtained smoking history,(former, quit 1998) as well as answering questions related to screening process. Patient is not a candidate for screening due to quit date of > 15 years ago. Discussed this with patient, positive impact of quitting and not being considered as high risk warranting screening scan, and importance of reporting any symptoms of lung cancer to MD.

## 2016-06-11 ENCOUNTER — Encounter: Payer: Self-pay | Admitting: Family

## 2016-06-14 ENCOUNTER — Other Ambulatory Visit: Payer: Self-pay

## 2016-06-14 MED ORDER — NEBIVOLOL HCL 5 MG PO TABS: 5.0000 mg | ORAL_TABLET | Freq: Every day | ORAL | 3 refills | Status: DC

## 2016-06-14 NOTE — Telephone Encounter (Signed)
Refill sent for Bystolic 5 mg

## 2016-06-27 ENCOUNTER — Encounter: Payer: Medicare Other | Admitting: Internal Medicine

## 2016-07-03 ENCOUNTER — Telehealth: Payer: Self-pay | Admitting: *Deleted

## 2016-07-03 NOTE — Telephone Encounter (Signed)
Called patient and notified she is due for second pneumococcal 23 vaccine.  States she had a reaction to the first vaccine and will wait for follow up with NP at upcoming appointment before receiving.

## 2016-07-03 NOTE — Telephone Encounter (Signed)
Please read previous note.

## 2016-07-03 NOTE — Telephone Encounter (Signed)
Pt would like to know If she would need another pneumococcal injection  Pt contact [phone]

## 2016-07-17 ENCOUNTER — Ambulatory Visit (INDEPENDENT_AMBULATORY_CARE_PROVIDER_SITE_OTHER): Payer: Medicare Other | Admitting: Neurology

## 2016-07-17 ENCOUNTER — Encounter: Payer: Self-pay | Admitting: Neurology

## 2016-07-17 VITALS — BP 138/82 | HR 75 | Ht 67.5 in | Wt 163.0 lb

## 2016-07-17 DIAGNOSIS — R41844 Frontal lobe and executive function deficit: Secondary | ICD-10-CM | POA: Diagnosis not present

## 2016-07-17 DIAGNOSIS — D329 Benign neoplasm of meninges, unspecified: Secondary | ICD-10-CM | POA: Diagnosis not present

## 2016-07-17 DIAGNOSIS — F419 Anxiety disorder, unspecified: Secondary | ICD-10-CM | POA: Diagnosis not present

## 2016-07-17 MED ORDER — ESCITALOPRAM OXALATE 10 MG PO TABS: 10.0000 mg | ORAL_TABLET | Freq: Every day | ORAL | 5 refills | Status: DC

## 2016-07-17 NOTE — Patient Instructions (Signed)
1.  For anxiety, we will start escitalopram (Lexapro) 10mg  daily 2.  Follow up in 6 months.

## 2016-07-17 NOTE — Progress Notes (Signed)
Chart forwarded.  

## 2016-07-17 NOTE — Progress Notes (Signed)
NEUROLOGY FOLLOW UP OFFICE NOTE  Meghan Mack Mack [id number]  HISTORY OF PRESENT ILLNESS: Meghan Mack Mack is a 65 year old right-handed female with COPD, IBS, anxiety, Sjogren's, hypothyroidism and hyperlipidemia who follows up for meningioma and memory loss.     UPDATE: She was evaluated by Dr. Posey Pronto, a neuromuscular specialist.  She did not appreciate any abnormalities on her exam.  History and exam findings were not suspicious for a myopathy, motor neuron disease or neuromuscular junction disorder.   Continued exercise and PT was recommended.  She is feeling well.  She reports one brief episode of leg weakness, but overall she feels well.  She has a follow up appointment with neuropsychology tomorrow.  HISTORY: Meningioma: She has known anterior left tentorial meningioma since 2008.  For several years, she has been followed by Dr. Sherwood Gambler at [facility].  Observation was recommended.  Most recent MRI of brain available from 01/24/14 showed it to be 9 x 9 x 6 mm, which appears stable compared to prior imaging from 2013, 2011, 2009 and 2008 (measuring around 8 x7 mm to 9 x 6 x 6 mm).  There is no surrounding vasogenic edema.  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  MRI of brain with and without contrast from 01/07/16 showed stable sub-centimeter left posterior fossa meningioma.   Memory: She reports increased memory problems.  When she sees an object, she has trouble remembering the name, although she recognizes it.  This has been happening more frequently.  She also has had trouble recalling names of people she knows and sees frequently, such as the name of her close friend's wife.  On occasion, she has needed to get out the recipe of a cake that she has baked on multiple occasions for years.  Rarely, she may briefly be disoriented while driving on familiar routes.  B12 was 810.  She has hypothyroidism due to surgical resection for  Hashimoto's and takes Synthroid.  She underwent neuropsychological testing on 10/27/15.  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  She exhibited difficulty performing complex planning/organization, problem solving, and learning, as well as demonstrating moderate motor preservation.  She did not exhibit amnestic memory deficit, but rather it is the executive dysfunction that is impacting her memory.  However, she did not meet criteria for dementia and does not demonstrate evidence for frontotemporal dementia, as she has no reported behavioral or language changes.    She also met criteria for mild generalized anxiety disorder.   Lower extremity weakness: Also, over the past year, she reports falls.  She has tripped and fallen about 4 times.  She says that her right leg just doesn't "want to move".  If she is squatting, she is unable to stand up without pushing off with her arms.  She denies numbness in the feet.  Sometimes, if she clutches something hard or opens up a jar, it is difficult for her to open her hand.  She denies problems swallowing or shortness of breath.  She exhibited lower extremity weakness and intrinsic hand muscle weakness.  CK was 41.  TSH was 0.428 (she has hypothyroidism and had adjustment made to her medication).  NCV-EMG on 12/06/15, which was normal.  MRI of the cervical spine on 12/17/15 was unremarkable.  MRI of thoracic spine showed minimal discogenic edema anteriorly at T10-11 as well as benign hemangioma on the right side of the T5 vertebral body.  MRI  of lumbar spine showed shallow broad-based disc herniation at L5-S1, with mild facet degeneration and hypertrophy bilaterally, which potentially could affect S1 nerve root, but no obvious impingement seen.     Her mother developed dementia in her late 36s.  She began having gait issues in her 13s or 4s.  Otherwise, she denies other family history of dementia  or gait problems.  PAST MEDICAL HISTORY: Past Medical History:  Diagnosis Date  . Anemia   . Colonic polyp   . Constipation   . COPD (chronic obstructive pulmonary disease) ([place])   . Hyperlipidemia   . IBS (irritable bowel syndrome)   . Nausea   . Palpitations     MEDICATIONS: Current Outpatient Prescriptions on File Prior to Visit  Medication Sig Dispense Refill  . acyclovir ointment (ZOVIRAX) 5 % Apply 1 application topically every 3 (three) hours.     . ergocalciferol (VITAMIN D2) 50000 UNITS capsule Take 50,000 Units by mouth every 30 (thirty) days.    Marland Kitchen levothyroxine (SYNTHROID, LEVOTHROID) 88 MCG tablet Take 1 tablet 6 days a week.    . Multiple Vitamin (MULTIVITAMIN PO) Take 1 tablet by mouth daily.      . nebivolol (BYSTOLIC) 5 MG tablet Take 1 tablet (5 mg total) by mouth daily. 90 tablet 3  . propranolol (INDERAL) 10 MG tablet Take 1 tablet (10 mg total) by mouth 3 (three) times daily as needed. 90 tablet 1  . ranitidine (ZANTAC) 150 MG tablet Take 150 mg by mouth as needed.      . simvastatin (ZOCOR) 20 MG tablet TAKE 1 TABLET EVERY EVENING (IMPROVES CHOLESTEROL) 90 tablet 3   No current facility-administered medications on file prior to visit.     ALLERGIES: Allergies  Allergen Reactions  . Tdap [Diphth-Acell Pertussis-Tetanus] Swelling and Rash  . Codeine   . Diflucan [Fluconazole] Hives  . Esomeprazole Magnesium     REACTION: nausea  . Meperidine Hcl     REACTION: Causes heart to race  . Pneumovax 23 [Pneumococcal Vac Polyvalent]     Extensive local reaction with arm swelling and induration  . Propantheline Bromide   . Propoxyphene N-Acetaminophen     REACTION: heart race  . Sulfonamide Derivatives     FAMILY HISTORY: Family History  Problem Relation Age of Onset  . Cancer Mother     colon  . Emphysema Father   . Heart disease Father   . Cancer Father     leukemia  . Cancer Brother     lung  . Cancer Maternal Aunt     melanoma  . Cancer  Maternal Grandfather     stomach, colon    SOCIAL HISTORY: Social History   Social History  . Marital status: Married    Spouse name: N/A  . Number of children: 2  . Years of education: N/A   Occupational History  . [employer]   Social History Main Topics  . Smoking status: Former Smoker    Packs/day: 0.25    Years: 10.00    Types: Cigarettes    Quit date: 09/25/1997  . Smokeless tobacco: Never Used  . Alcohol use No  . Drug use: No  . Sexual activity: Yes    Partners: Male    Birth control/ protection: Surgical   Other Topics Concern  . Not on file   Social History Narrative   Lives with husband in [place], has 2 children and 2 step-children. No pets.      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       Diet - regular   Exercise - walks 5 days per week   1.5 years of college    REVIEW OF SYSTEMS: Constitutional: No fevers, chills, or sweats, no generalized fatigue, change in appetite Eyes: No visual changes, double vision, eye pain Ear, nose and throat: No hearing loss, ear pain, nasal congestion, sore throat Cardiovascular: No chest pain, palpitations Respiratory:  No shortness of breath at rest or with exertion, wheezes GastrointestinaI: No nausea, vomiting, diarrhea, abdominal pain, fecal incontinence Genitourinary:  No dysuria, urinary retention or frequency Musculoskeletal:  No neck pain, back pain Integumentary: No rash, pruritus, skin lesions Neurological: as above Psychiatric: No depression, insomnia, anxiety Endocrine: No palpitations, fatigue, diaphoresis, mood swings, change in appetite, change in weight, increased thirst Hematologic/Lymphatic:  No purpura, petechiae. Allergic/Immunologic: no itchy/runny eyes, nasal congestion, recent allergic reactions, rashes  PHYSICAL EXAM: Vitals:   07/17/16 0834  BP: 138/82  Pulse: 75   General: No acute distress.  Patient appears well-groomed.  normal body  habitus. Head:  Normocephalic/atraumatic Eyes:  Fundi examined but not visualized Neurological Exam: alert and oriented to person, place, and time. Attention span and concentration intact, recent and remote memory intact, fund of knowledge intact.  Speech fluent and not dysarthric, language intact.  CN II-XII intact. Bulk and tone normal, muscle strength 5/5 throughout.  Sensation to light touch  intact.  Deep tendon reflexes 2+ throughout.  Finger to nose testing intact.  Gait normal, Romberg negative.  IMPRESSION: Executive dysfunction.  Possibly related to anxiety.  No evidence of FTD or FTLD. Anxiety Meningioma. stable  PLAN: 1.  Will start Lexapro 41m daily for anxiety 2.  Follow up in 6 months.  25 minutes spent face to face with patient, over 50% spent counseling.  AMetta Clines DO  CC:  MMable Paris FNP

## 2016-07-19 ENCOUNTER — Telehealth: Payer: Self-pay | Admitting: *Deleted

## 2016-07-19 NOTE — Telephone Encounter (Signed)
Pt is currently wakes up from sleep to catch her breath, she stated that her neurophysiologist- brain and memory provider requested that she speak with her PCP. She currently has a upcoming appt. to meet with Christen Butter. Pt requested a referral to a sleep study. Pt contact [phone]

## 2016-07-19 NOTE — Telephone Encounter (Signed)
Pt has been scheduled.  °

## 2016-07-19 NOTE — Telephone Encounter (Signed)
Left message for patient to return call back to schedule sooner appointment for referral for sleep study.

## 2016-08-07 ENCOUNTER — Ambulatory Visit (INDEPENDENT_AMBULATORY_CARE_PROVIDER_SITE_OTHER): Payer: Medicare Other | Admitting: Family

## 2016-08-07 ENCOUNTER — Encounter: Payer: Self-pay | Admitting: Family

## 2016-08-07 VITALS — BP 132/90 | HR 96 | Temp 97.9°F | Ht 67.5 in | Wt 164.0 lb

## 2016-08-07 DIAGNOSIS — H40059 Ocular hypertension, unspecified eye: Secondary | ICD-10-CM | POA: Insufficient documentation

## 2016-08-07 DIAGNOSIS — R0683 Snoring: Secondary | ICD-10-CM | POA: Insufficient documentation

## 2016-08-07 DIAGNOSIS — F419 Anxiety disorder, unspecified: Secondary | ICD-10-CM | POA: Diagnosis not present

## 2016-08-07 DIAGNOSIS — E039 Hypothyroidism, unspecified: Secondary | ICD-10-CM

## 2016-08-07 DIAGNOSIS — R002 Palpitations: Secondary | ICD-10-CM

## 2016-08-07 NOTE — Assessment & Plan Note (Addendum)
Stable. Untreated. Patient has complex history of palpitations, worsening when started on SSRI, Paxil, years ago. She also has a history of increased intraocular pressure is of which are contraindications SSRIs. We discussed her anxiety and trialing BuSpar after she speaks ophthalmologist and I confirm with cardiologist, Dr. Rockey Situ (message sent today), that medication is safe to take. Declines referral for counseling today.

## 2016-08-07 NOTE — Assessment & Plan Note (Signed)
Chronic. No acute cardiac symptoms today. Follows with Dr Rockey Situ.

## 2016-08-07 NOTE — Assessment & Plan Note (Addendum)
Chronic. S/p stent for sjogrens.  Follows with opthalmology.

## 2016-08-07 NOTE — Assessment & Plan Note (Signed)
Pending sleep study for OSA.

## 2016-08-07 NOTE — Progress Notes (Signed)
Subjective:    Patient ID: Meghan Mack, female    DOB: Feb 03, 1951, 65 y.o.   MRN: [id number]  CC: Meghan Mack is a 65 y.o. female who presents today for follow up.   HPI: Patient here for follow-up and to establish care.  Anxiety- For several years. Stable. No treatment currently. Had been on paxil in the past for panic attacks with some relief. Follows with Dr. Tomi Likens neurologist, recently prescribed lexapro however concerned that it would cause pressure in eyes to increase. Panic attacks have improved. Has had counselor in the past however it didn't work well. Triggers include dentist, doctor, occasional driving. No depression. No thoughts of hurting  Herself or anyone else. Fall asleep quickly sometime wakes up.  Increased eye pressure- chronic. She's had stents placed due to her dry eyes related to Sjogren syndrome. Following up with opthalmology today.    Hypothyroidism- Stable. Compliant with medication.  Palpitations- Chronic. Intermittent. Compliant with medication.  Denies exertional chest pain or pressure, numbness or tingling radiating to left arm or jaw,  dizziness, frequent headaches, changes in vision, or shortness of breath.   Husband notes she is snoring . Had a sleep study 15+ years ago and was never formally diagnosed.   CPE 05/2016 with screening labs.       HISTORY:  Past Medical History:  Diagnosis Date  . Anemia   . Colonic polyp   . Constipation   . COPD (chronic obstructive pulmonary disease) ([place])   . Hyperlipidemia   . IBS (irritable bowel syndrome)   . Nausea   . Palpitations    Past Surgical History:  Procedure Laterality Date  . CARDIAC CATHETERIZATION  02/25/2007  . COLONOSCOPY    . ESOPHAGOGASTRODUODENOSCOPY  10/1997 & 08/2005  . FOOT SURGERY  04/25/2007   right  . THYROIDECTOMY  11/2005  . VAGINAL HYSTERECTOMY     ovaries intact   Family History  Problem Relation Age of Onset  . Cancer Mother     colon  . Emphysema Father   .  Heart disease Father   . Cancer Father     leukemia  . Cancer Brother     lung  . Cancer Maternal Aunt     melanoma  . Cancer Maternal Grandfather     stomach, colon    Allergies: Tdap [diphth-acell pertussis-tetanus]; Codeine; Diflucan [fluconazole]; Esomeprazole magnesium; Meperidine hcl; Pneumovax 23 [pneumococcal vac polyvalent]; Propantheline bromide; Propoxyphene n-acetaminophen; and Sulfonamide derivatives Current Outpatient Prescriptions on File Prior to Visit  Medication Sig Dispense Refill  . acyclovir ointment (ZOVIRAX) 5 % Apply 1 application topically every 3 (three) hours.     . ergocalciferol (VITAMIN D2) 50000 UNITS capsule Take 50,000 Units by mouth every 30 (thirty) days.    Marland Kitchen levothyroxine (SYNTHROID, LEVOTHROID) 88 MCG tablet Take 1 tablet 6 days a week.    . Multiple Vitamin (MULTIVITAMIN PO) Take 1 tablet by mouth daily.      . nebivolol (BYSTOLIC) 5 MG tablet Take 1 tablet (5 mg total) by mouth daily. 90 tablet 3  . propranolol (INDERAL) 10 MG tablet Take 1 tablet (10 mg total) by mouth 3 (three) times daily as needed. 90 tablet 1  . ranitidine (ZANTAC) 150 MG tablet Take 150 mg by mouth as needed.      . simvastatin (ZOCOR) 20 MG tablet TAKE 1 TABLET EVERY EVENING (IMPROVES CHOLESTEROL) 90 tablet 3   No current facility-administered medications on file prior to visit.     Social History  Substance Use Topics  . Smoking status: Former Smoker    Packs/day: 0.25    Years: 10.00    Types: Cigarettes    Quit date: 09/25/1997  . Smokeless tobacco: Never Used  . Alcohol use No    Review of Systems  Constitutional: Negative for chills and fever.  Respiratory: Negative for cough.   Cardiovascular: Positive for palpitations. Negative for chest pain.  Gastrointestinal: Negative for nausea and vomiting.  Psychiatric/Behavioral: The patient is nervous/anxious.       Objective:    BP 132/90   Pulse 96   Temp 97.9 F (36.6 C) (Oral)   Ht 5' 7.5" (1.715 m)    Wt 164 lb (74.4 kg)   SpO2 98%   BMI 25.31 kg/m  BP Readings from Last 3 Encounters:  08/07/16 132/90  07/17/16 138/82  05/16/16 (!) 152/88   Wt Readings from Last 3 Encounters:  08/07/16 164 lb (74.4 kg)  07/17/16 163 lb (73.9 kg)  05/16/16 161 lb 9.6 oz (73.3 kg)    Physical Exam  Constitutional: She appears well-developed and well-nourished.  Eyes: Conjunctivae are normal.  Cardiovascular: Normal rate, regular rhythm, normal heart sounds and normal pulses.   Pulmonary/Chest: Effort normal and breath sounds normal. She has no wheezes. She has no rhonchi. She has no rales.  Neurological: She is alert.  Skin: Skin is warm and dry.  Psychiatric: She has a normal mood and affect. Her speech is normal and behavior is normal. Thought content normal.  Vitals reviewed.      Assessment & Plan:   Problem List Items Addressed This Visit      Endocrine   Hypothyroidism    Symptomatically stable. TSH normal 8/17.        Other   Palpitations    Chronic. No acute cardiac symptoms today. Follows with Dr Rockey Situ.      Anxiety    Patient has complex history of palpitations, worsening when started on SSRI, Paxil, years ago. She also has a history of increased intraocular pressure is of which are contraindications SSRIs. We discussed her anxiety and trialing BuSpar after she speaks ophthalmologist and I confirm with cardiologist, Dr. Rockey Situ (message sent today), that medication is safe to take. Declines referral for counseling today.      Snoring - Primary    Pending sleep study for OSA.      Relevant Orders   Ambulatory referral to Sleep Studies   Increased intraocular pressure    Chronic. S/p stent for sjogrens.  Follows with opthalmology.        Other Visit Diagnoses   None.      I have discontinued Ms. Panagopoulos's escitalopram. I am also having her maintain her Multiple Vitamin (MULTIVITAMIN PO), levothyroxine, ranitidine, ergocalciferol, acyclovir ointment,  propranolol, simvastatin, and nebivolol.   No orders of the defined types were placed in this encounter.   Return precautions given.   Risks, benefits, and alternatives of the medications and treatment plan prescribed today were discussed, and patient expressed understanding.   Education regarding symptom management and diagnosis given to patient on AVS.  Continue to follow with Mable Paris, FNP for routine health maintenance.   Luberta Robertson and I agreed with plan.   Mable Paris, FNP

## 2016-08-07 NOTE — Patient Instructions (Addendum)
I would like to consider starting medication called BuSpar for your anxiety. When I looked up his medication I do not see any contra indications for increased intraocular pressure or palpitations.   Please consult with your ophthalmologist today at your appointment today and asked his/her professional opinion of this.  I have messaged Dr Rockey Situ as well.

## 2016-08-07 NOTE — Progress Notes (Signed)
Pre visit review using our clinic review tool, if applicable. No additional management support is needed unless otherwise documented below in the visit note. 

## 2016-08-07 NOTE — Assessment & Plan Note (Signed)
Symptomatically stable. TSH normal 8/17.

## 2016-08-09 ENCOUNTER — Encounter: Payer: Self-pay | Admitting: Family

## 2016-08-16 ENCOUNTER — Ambulatory Visit: Payer: Medicare Other | Admitting: Family

## 2016-08-24 ENCOUNTER — Other Ambulatory Visit: Payer: Self-pay | Admitting: Obstetrics and Gynecology

## 2016-08-24 DIAGNOSIS — Z1231 Encounter for screening mammogram for malignant neoplasm of breast: Secondary | ICD-10-CM

## 2016-08-27 ENCOUNTER — Ambulatory Visit: Payer: Medicare Other | Admitting: Family

## 2016-09-11 ENCOUNTER — Encounter: Payer: Self-pay | Admitting: Family

## 2016-09-11 ENCOUNTER — Ambulatory Visit (INDEPENDENT_AMBULATORY_CARE_PROVIDER_SITE_OTHER): Payer: Medicare Other | Admitting: Family

## 2016-09-11 ENCOUNTER — Ambulatory Visit: Payer: Medicare Other | Admitting: Family

## 2016-09-11 VITALS — BP 130/82 | HR 85 | Temp 97.6°F | Wt 165.0 lb

## 2016-09-11 DIAGNOSIS — M7989 Other specified soft tissue disorders: Secondary | ICD-10-CM

## 2016-09-11 NOTE — Progress Notes (Signed)
Subjective:    Patient ID: Meghan Mack, female    DOB: 1951-03-08, 65 y.o.   MRN: [id number]  CC: Meghan Mack is a 65 y.o. female who presents today for an acute visit.    HPI: Cc; "lump" on right ankle couple of weeks ago, improving. Has started walking program and noticed 'puffiness' then.  No pain with waking.   Notes fall on right ankle several months ago after missing step, has followed with hot/cold and it resolved.   Does note occasional BLE swelling around ankles with certain food , eg. [place].          HISTORY:  Past Medical History:  Diagnosis Date  . Anemia   . Colonic polyp   . Constipation   . COPD (chronic obstructive pulmonary disease) ([place])   . Hyperlipidemia   . IBS (irritable bowel syndrome)   . Nausea   . Palpitations    Past Surgical History:  Procedure Laterality Date  . CARDIAC CATHETERIZATION  02/25/2007  . COLONOSCOPY    . ESOPHAGOGASTRODUODENOSCOPY  10/1997 & 08/2005  . FOOT SURGERY  04/25/2007   right  . THYROIDECTOMY  11/2005  . VAGINAL HYSTERECTOMY     ovaries intact   Family History  Problem Relation Age of Onset  . Cancer Mother     colon  . Emphysema Father   . Heart disease Father   . Cancer Father     leukemia  . Cancer Brother     lung  . Cancer Maternal Aunt     melanoma  . Cancer Maternal Grandfather     stomach, colon    Allergies: Tdap [diphth-acell pertussis-tetanus]; Codeine; Diflucan [fluconazole]; Esomeprazole magnesium; Meperidine hcl; Pneumovax 23 [pneumococcal vac polyvalent]; Propantheline bromide; Propoxyphene n-acetaminophen; and Sulfonamide derivatives Current Outpatient Prescriptions on File Prior to Visit  Medication Sig Dispense Refill  . acyclovir ointment (ZOVIRAX) 5 % Apply 1 application topically every 3 (three) hours.     . ergocalciferol (VITAMIN D2) 50000 UNITS capsule Take 50,000 Units by mouth every 30 (thirty) days.    Marland Kitchen levothyroxine (SYNTHROID, LEVOTHROID) 88 MCG tablet Take 1  tablet 6 days a week.    . Multiple Vitamin (MULTIVITAMIN PO) Take 1 tablet by mouth daily.      . nebivolol (BYSTOLIC) 5 MG tablet Take 1 tablet (5 mg total) by mouth daily. 90 tablet 3  . ranitidine (ZANTAC) 150 MG tablet Take 150 mg by mouth as needed.      . simvastatin (ZOCOR) 20 MG tablet TAKE 1 TABLET EVERY EVENING (IMPROVES CHOLESTEROL) 90 tablet 3   No current facility-administered medications on file prior to visit.     Social History  Substance Use Topics  . Smoking status: Former Smoker    Packs/day: 0.25    Years: 10.00    Types: Cigarettes    Quit date: 09/25/1997  . Smokeless tobacco: Never Used  . Alcohol use No    Review of Systems  Constitutional: Negative for chills and fever.  Respiratory: Negative for cough and shortness of breath.   Cardiovascular: Positive for leg swelling (chronic, interrmittent). Negative for chest pain and palpitations.  Gastrointestinal: Negative for nausea and vomiting.  Musculoskeletal: Positive for joint swelling.      Objective:    BP 130/82   Pulse 85   Temp 97.6 F (36.4 C) (Oral)   Wt 165 lb (74.8 kg)   SpO2 97%   BMI 25.46 kg/m    Physical Exam  Constitutional: She appears  well-developed and well-nourished.  Eyes: Conjunctivae are normal.  Cardiovascular: Normal rate, regular rhythm, normal heart sounds and normal pulses.   BLE nonpitting + edema. No palpable cords or masses. No erythema or increased warmth. No asymmetry in calf size when compared bilaterally LE hair growth symmetric and present. No discoloration.  BLE varicosities noted. LE warm and palpable pedal pulses.   Pulmonary/Chest: Effort normal and breath sounds normal. She has no wheezes. She has no rhonchi. She has no rales.  Musculoskeletal:       Right ankle: She exhibits swelling. She exhibits normal range of motion. No tenderness.       Left ankle: She exhibits swelling. She exhibits normal range of motion. No tenderness. No lateral malleolus  tenderness found.       Feet:  Bilateral swelling noted over lateral malleolus as noted on diagram. R > L. No warmth, erythema, tenderness. Full ROM of bilateral ankle.  Neurological: She is alert.  Skin: Skin is warm and dry.  Psychiatric: She has a normal mood and affect. Her speech is normal and behavior is normal. Thought content normal.  Vitals reviewed.      Assessment & Plan:  1. Leg swelling Working diagnosis of venous insufficiency. Nonpitting +1 edema bilaterally. Worsened by patient's low cut sock, low cut. As I explained to patient, I suspect the swelling over lateral malleolus is third spacing and will resolve with compression hose, elevation, low-salt. We jointly agreed on conservative therapy at this time ; patient will return in a couple of weeks or sooner if not better we will further evaluate with imaging, possible diuretic therapy.  - Compression stockings     I have discontinued Ms. Cathell's propranolol. I am also having her maintain her Multiple Vitamin (MULTIVITAMIN PO), levothyroxine, ranitidine, ergocalciferol, acyclovir ointment, simvastatin, and nebivolol.   No orders of the defined types were placed in this encounter.   Return precautions given.   Risks, benefits, and alternatives of the medications and treatment plan prescribed today were discussed, and patient expressed understanding.   Education regarding symptom management and diagnosis given to patient on AVS.  Continue to follow with Mable Paris, FNP for routine health maintenance.   Luberta Robertson and I agreed with plan.   Mable Paris, FNP

## 2016-09-11 NOTE — Patient Instructions (Signed)
Medical grade compression hose from medical supply store.  Low salt.   Elevation  F/u 2-3 weeks, sooner if not better.

## 2016-09-11 NOTE — Progress Notes (Signed)
Pre visit review using our clinic review tool, if applicable. No additional management support is needed unless otherwise documented below in the visit note. 

## 2016-09-24 ENCOUNTER — Telehealth: Payer: Self-pay | Admitting: Family

## 2016-09-24 DIAGNOSIS — M7989 Other specified soft tissue disorders: Secondary | ICD-10-CM

## 2016-09-24 NOTE — Telephone Encounter (Signed)
Venous US placed. Please let pt know.

## 2016-09-24 NOTE — Telephone Encounter (Signed)
Pt called and said that she tried the compression stocking as advised and they are not helping much. Pt wants to know if you could go ahead with ordering the xray/ultrasound that you had discussed. Please advise, thank you!  Call pt @ [phone]

## 2016-09-24 NOTE — Telephone Encounter (Signed)
Please advise 

## 2016-09-24 NOTE — Telephone Encounter (Signed)
Patient was informed.

## 2016-09-25 ENCOUNTER — Ambulatory Visit: Payer: Medicare Other | Attending: Neurology

## 2016-09-25 DIAGNOSIS — G4733 Obstructive sleep apnea (adult) (pediatric): Secondary | ICD-10-CM | POA: Insufficient documentation

## 2016-09-25 DIAGNOSIS — R0683 Snoring: Secondary | ICD-10-CM | POA: Diagnosis present

## 2016-09-27 ENCOUNTER — Other Ambulatory Visit: Payer: Self-pay | Admitting: Family

## 2016-09-27 DIAGNOSIS — M7989 Other specified soft tissue disorders: Secondary | ICD-10-CM

## 2016-10-02 ENCOUNTER — Ambulatory Visit
Admission: RE | Admit: 2016-10-02 | Discharge: 2016-10-02 | Disposition: A | Discharge status: Home / Self Care | Payer: Medicare Other | Source: Ambulatory Visit | Attending: Family | Admitting: Family

## 2016-10-02 ENCOUNTER — Ambulatory Visit
Admission: RE | Admit: 2016-10-02 | Discharge: 2016-10-02 | Disposition: A | Discharge status: Home / Self Care | Payer: Medicare Other | Source: Ambulatory Visit | Attending: Obstetrics and Gynecology | Admitting: Obstetrics and Gynecology

## 2016-10-02 DIAGNOSIS — Z1231 Encounter for screening mammogram for malignant neoplasm of breast: Secondary | ICD-10-CM

## 2016-10-02 DIAGNOSIS — M7989 Other specified soft tissue disorders: Secondary | ICD-10-CM | POA: Insufficient documentation

## 2016-10-03 ENCOUNTER — Encounter: Payer: Self-pay | Admitting: Family

## 2016-10-03 ENCOUNTER — Ambulatory Visit: Payer: Medicare Other

## 2016-10-04 ENCOUNTER — Telehealth: Payer: Self-pay | Admitting: *Deleted

## 2016-10-04 ENCOUNTER — Telehealth: Payer: Self-pay | Admitting: Family

## 2016-10-04 NOTE — Telephone Encounter (Signed)
Spoke with pt-  Discussed sleep study results H/o arrhyhtmia and uses bystolic. Follows with [facility]; reviewed holter monitor results.   LE swelling better with compression hose, notes swelling in evenings. Discussed likely adipose tissue around ankles.   Will continue to monitor and let me know if sxs change, worsne.

## 2016-10-04 NOTE — Telephone Encounter (Signed)
Results have been mailed to patient

## 2016-10-04 NOTE — Telephone Encounter (Signed)
Patient requested a call in ref to results Pt contact [phone]

## 2016-10-04 NOTE — Telephone Encounter (Signed)
Patient requested a copy of her sleep study results  Pt contact [phone]

## 2016-10-04 NOTE — Telephone Encounter (Signed)
Meghan Mack has spoken with patient.

## 2016-10-09 ENCOUNTER — Telehealth: Payer: Self-pay | Admitting: Cardiovascular Disease

## 2016-10-09 DIAGNOSIS — G4733 Obstructive sleep apnea (adult) (pediatric): Secondary | ICD-10-CM

## 2016-10-09 DIAGNOSIS — I499 Cardiac arrhythmia, unspecified: Secondary | ICD-10-CM

## 2016-10-09 NOTE — Telephone Encounter (Signed)
Pt had a sleep study done and they found some Arrhythmia, and this is what she see's [place] for Wanted to see if she can do a monitor again  She states last time she did one was in 2014 Just too see if that is the reason she is not able to get a good nights rest. Please advise

## 2016-10-09 NOTE — Telephone Encounter (Signed)
Pt's recent SS found OSA w/ some noted arrythmias. She would like to know if she can wear a 30 day monitor, as her last one was over 3 years ago and she would like to see if this is contributing to her OSA and daytime somnolence. Pt's last ov was in 02/2016, please advise if she needs to come in to discuss.

## 2016-10-10 NOTE — Telephone Encounter (Signed)
The SS simply states "arrhythmias noted".  Can you see if she would like to come in and have Zio placed? I've entered the order.  Thank you.

## 2016-10-10 NOTE — Telephone Encounter (Signed)
Okay to wear 14 day monitor Can we see if there is any data on arrhythmia from sleep study

## 2016-10-10 NOTE — Telephone Encounter (Signed)
Pt is coming in now on 10/16/16 at [facility]

## 2016-10-16 ENCOUNTER — Ambulatory Visit (INDEPENDENT_AMBULATORY_CARE_PROVIDER_SITE_OTHER): Payer: Medicare Other

## 2016-10-16 ENCOUNTER — Other Ambulatory Visit: Payer: Self-pay

## 2016-10-16 DIAGNOSIS — G4733 Obstructive sleep apnea (adult) (pediatric): Secondary | ICD-10-CM

## 2016-10-16 DIAGNOSIS — I499 Cardiac arrhythmia, unspecified: Secondary | ICD-10-CM

## 2016-10-16 MED ORDER — NEBIVOLOL HCL 5 MG PO TABS: 5.0000 mg | ORAL_TABLET | Freq: Every day | ORAL | 6 refills | Status: DC

## 2016-10-16 MED ORDER — SIMVASTATIN 20 MG PO TABS: ORAL_TABLET | ORAL | 6 refills | Status: DC

## 2016-10-23 ENCOUNTER — Telehealth: Payer: Self-pay | Admitting: Cardiovascular Disease

## 2016-10-23 ENCOUNTER — Encounter: Payer: Self-pay | Admitting: Family

## 2016-10-23 NOTE — Telephone Encounter (Signed)
Spoke w/ pt.  Advised her of Dr. Donivan Scull recommendation.  She would like to call the company and make sure that they will not charge her again if she wears another monitor. Asked her to call back and let the front desk know if she would like an appt to have another zio placed or if she would like to wait on results of first monitor. She is appreciative of the call and prompt response.

## 2016-10-23 NOTE — Telephone Encounter (Signed)
Patient had some problems getting sio monitor to stay on.  She called the company several times and was unable to get the monitor to stay. Zio rep instructed her to mail the monitor back.  She took it off this morning .  Patient says she was told if Rockey Situ does not have enough data they will have her apply again and there will be no charge.  Patient would like to discuss this with the nurse.  Please call.

## 2016-10-23 NOTE — Telephone Encounter (Signed)
Would leave it up to her but would consider wearing additional zio patch for 2 weeks  if this is covered (company will give it to her free). We are looking for arrhythmia at night Other option would be to wait and see what the download shows from the one she has worn already Would probably not order the 30 day as do not think insurance would cover both

## 2016-10-23 NOTE — Telephone Encounter (Signed)
Spoke w/ pt.  She reports that she mailed the [facility] monitor back, she only wore it for a week. She was advised by the rep that she would be billed for the entire 2 week period, but credited back if she is advised to wear another one.  She states that she did not have any sx while she was wearing the monitor and that "no one seems to understand what I am looking for" and that she has been discussing this w/ Dr. Rockey Situ.  Advised pt that she has not been seen since May 2017, she states "I only come in once a year". She would like to know if ins will cover her wearing a 30 day monitor or if she should attempt the 14 day monitor again.  Advised her that I will make Dr. Rockey Situ aware of her concerns and call her back w/ his recommendation.

## 2016-11-14 ENCOUNTER — Other Ambulatory Visit: Payer: Self-pay | Admitting: *Deleted

## 2016-11-14 ENCOUNTER — Other Ambulatory Visit (INDEPENDENT_AMBULATORY_CARE_PROVIDER_SITE_OTHER): Payer: Medicare Other

## 2016-11-14 DIAGNOSIS — R002 Palpitations: Secondary | ICD-10-CM | POA: Diagnosis not present

## 2016-12-07 ENCOUNTER — Telehealth: Payer: Self-pay | Admitting: Family

## 2016-12-07 MED ORDER — OSELTAMIVIR PHOSPHATE 75 MG PO CAPS: 75.0000 mg | ORAL_CAPSULE | Freq: Every day | ORAL | 0 refills | Status: DC

## 2016-12-07 NOTE — Telephone Encounter (Signed)
Patient calls states thinks she had flu this past week had fever and cough .  Now she is fine just sinus drainage.  Husband went to doctor and diagnosed  the flu.  Wondering if Tamiflu would help.  Advised her it would have to be prescribed 48 hour prophylaxis .   Please advise.

## 2016-12-07 NOTE — Telephone Encounter (Signed)
She does not need tamiflu if she is recovering from what she thought was the flu.  It is only effective if taken within the first 48 hours of symptoms, or exposure, but if she is worried about getting it again I can send in a prophylaxis dose of one tablet daily for ten days .  rx sent

## 2016-12-07 NOTE — Telephone Encounter (Signed)
Left detailed message for patient.

## 2016-12-07 NOTE — Telephone Encounter (Signed)
Pt called and stated that she had the flu and is feeling much better. She had a couple of questions in regarding the flu and the tamiflu. Please advise, thank you!  Call pt @ [phone]

## 2016-12-10 ENCOUNTER — Encounter: Payer: Self-pay | Admitting: Family

## 2016-12-10 ENCOUNTER — Ambulatory Visit (INDEPENDENT_AMBULATORY_CARE_PROVIDER_SITE_OTHER): Payer: Medicare Other | Admitting: Family

## 2016-12-10 VITALS — BP 134/90 | HR 113 | Temp 98.5°F | Ht 67.5 in | Wt 160.6 lb

## 2016-12-10 DIAGNOSIS — R0981 Nasal congestion: Secondary | ICD-10-CM | POA: Diagnosis not present

## 2016-12-10 MED ORDER — AZITHROMYCIN 250 MG PO TABS: ORAL_TABLET | ORAL | 0 refills | Status: DC

## 2016-12-10 NOTE — Patient Instructions (Signed)
I suspect that your infection is viral in nature.  As discussed, I advise that you wait to fill the antibiotic after 1-2 days of symptom management to see if your symptoms improve. If you do not show improvement, you may take the antibiotic as prescribed.  Increase intake of clear fluids. Congestion is best treated by hydration, when mucus is wetter, it is thinner, less sticky, and easier to expel from the body, either through coughing up drainage, or by blowing your nose.   Get plenty of rest.   Use saline nasal drops and blow your nose frequently. Run a humidifier at night and elevate the head of the bed. Vicks Vapor rub will help with congestion and cough. Steam showers and sinus massage for congestion.   Use Acetaminophen or Ibuprofen as needed for fever or pain. Avoid second hand smoke. Even the smallest exposure will worsen symptoms.   Over the counter medications you can try include Delsym for cough, a decongestant for congestion, and Mucinex or Robitussin as an expectorant. Be sure to just get the plain Mucinex or Robitussin that just has one medication (Guaifenesen). We don't recommend the combination products. Note, be sure to drink two glasses of water with each dose of Mucinex as the medication will not work well without adequate hydration.   You can also try a teaspoon of honey to see if this will help reduce cough. Throat lozenges can sometimes be beneficial as well.    This illness will typically last 7 - 10 days.   Please follow up with our clinic if you develop a fever greater than 101 F, symptoms worsen, or do not resolve in the next week.     

## 2016-12-10 NOTE — Progress Notes (Signed)
Subjective:    Patient ID: Meghan Mack, female    DOB: 12-11-1950, 66 y.o.   MRN: [id number]  CC: Meghan Mack is a 66 y.o. female who presents today for an acute visit.    HPI: CC: clear runny sinus congestion and post nasal drip for one week, improving. No cough, wheezing, sob. Endorses sinus pressure, chills, Fever last night, tmax 99.5. No bodyaches, ear pain, sore throat. Tried nasal saline, steam showers with little relief.   Husband has flu    HISTORY:  Past Medical History:  Diagnosis Date  . Anemia   . Colonic polyp   . Constipation   . COPD (chronic obstructive pulmonary disease) ([place])   . Hyperlipidemia   . IBS (irritable bowel syndrome)   . Nausea   . Palpitations    Past Surgical History:  Procedure Laterality Date  . CARDIAC CATHETERIZATION  02/25/2007  . COLONOSCOPY    . ESOPHAGOGASTRODUODENOSCOPY  10/1997 & 08/2005  . FOOT SURGERY  04/25/2007   right  . THYROIDECTOMY  11/2005  . VAGINAL HYSTERECTOMY     ovaries intact   Family History  Problem Relation Age of Onset  . Cancer Mother     colon  . Emphysema Father   . Heart disease Father   . Cancer Father     leukemia  . Cancer Brother     lung  . Cancer Maternal Aunt     melanoma  . Cancer Maternal Grandfather     stomach, colon    Allergies: Tdap [diphth-acell pertussis-tetanus]; Codeine; Diflucan [fluconazole]; Esomeprazole magnesium; Meperidine hcl; Pneumovax 23 [pneumococcal vac polyvalent]; Propantheline bromide; Propoxyphene n-acetaminophen; and Sulfonamide derivatives Current Outpatient Prescriptions on File Prior to Visit  Medication Sig Dispense Refill  . acyclovir ointment (ZOVIRAX) 5 % Apply 1 application topically every 3 (three) hours.     . ergocalciferol (VITAMIN D2) 50000 UNITS capsule Take 50,000 Units by mouth every 30 (thirty) days.    Marland Kitchen levothyroxine (SYNTHROID, LEVOTHROID) 88 MCG tablet Take 1 tablet 6 days a week.    . Multiple Vitamin (MULTIVITAMIN PO) Take 1  tablet by mouth daily.      . nebivolol (BYSTOLIC) 5 MG tablet Take 1 tablet (5 mg total) by mouth daily. 30 tablet 6  . oseltamivir (TAMIFLU) 75 MG capsule Take 1 capsule (75 mg total) by mouth daily. 10 capsule 0  . ranitidine (ZANTAC) 150 MG tablet Take 150 mg by mouth as needed.      . simvastatin (ZOCOR) 20 MG tablet TAKE 1 TABLET EVERY EVENING (IMPROVES CHOLESTEROL) 30 tablet 6   No current facility-administered medications on file prior to visit.     Social History  Substance Use Topics  . Smoking status: Former Smoker    Packs/day: 0.25    Years: 10.00    Types: Cigarettes    Quit date: 09/25/1997  . Smokeless tobacco: Never Used  . Alcohol use No    Review of Systems  Constitutional: Negative for chills and fever.  HENT: Positive for congestion, postnasal drip, sinus pain and sinus pressure. Negative for ear pain and sore throat.   Respiratory: Negative for cough, shortness of breath and wheezing.   Cardiovascular: Negative for chest pain and palpitations.  Gastrointestinal: Negative for nausea and vomiting.      Objective:    BP 134/90   Pulse (!) 113   Temp 98.5 F (36.9 C) (Oral)   Ht 5' 7.5" (1.715 m)   Wt 160 lb 9.6 oz (72.8  kg)   SpO2 98%   BMI 24.78 kg/m    Physical Exam  Constitutional: She appears well-developed and well-nourished.  HENT:  Head: Normocephalic and atraumatic.  Right Ear: Hearing, tympanic membrane, external ear and ear canal normal. No drainage, swelling or tenderness. No foreign bodies. Tympanic membrane is not erythematous and not bulging. No middle ear effusion. No decreased hearing is noted.  Left Ear: Hearing, tympanic membrane, external ear and ear canal normal. No drainage, swelling or tenderness. No foreign bodies. Tympanic membrane is not erythematous and not bulging.  No middle ear effusion. No decreased hearing is noted.  Nose: Nose normal. No rhinorrhea. Right sinus exhibits no maxillary sinus tenderness and no frontal sinus  tenderness. Left sinus exhibits no maxillary sinus tenderness and no frontal sinus tenderness.  Mouth/Throat: Uvula is midline, oropharynx is clear and moist and mucous membranes are normal. No oropharyngeal exudate, posterior oropharyngeal edema, posterior oropharyngeal erythema or tonsillar abscesses.  Eyes: Conjunctivae are normal.  Cardiovascular: Regular rhythm, normal heart sounds and normal pulses.   Pulmonary/Chest: Effort normal and breath sounds normal. She has no wheezes. She has no rhonchi. She has no rales.  Lymphadenopathy:       Head (right side): No submental, no submandibular, no tonsillar, no preauricular, no posterior auricular and no occipital adenopathy present.       Head (left side): No submental, no submandibular, no tonsillar, no preauricular, no posterior auricular and no occipital adenopathy present.    She has no cervical adenopathy.  Neurological: She is alert.  Skin: Skin is warm and dry.  Psychiatric: She has a normal mood and affect. Her speech is normal and behavior is normal. Thought content normal.  Vitals reviewed.      Assessment & Plan:   1. Sinus congestion Afebrile. Reassured as patient is improving. A long discussion that likely viral illness which is resolving on its own. No fevers today. Patient agreed to wait 1-2 more days of symptom management prior to going antibiotic. Return precautions given.   I am having Ms. Deem maintain her Multiple Vitamin (MULTIVITAMIN PO), levothyroxine, ranitidine, ergocalciferol, acyclovir ointment, nebivolol, simvastatin, and oseltamivir.   No orders of the defined types were placed in this encounter.   Return precautions given.   Risks, benefits, and alternatives of the medications and treatment plan prescribed today were discussed, and patient expressed understanding.   Education regarding symptom management and diagnosis given to patient on AVS.  Continue to follow with Mable Paris, FNP for routine  health maintenance.   Luberta Robertson and I agreed with plan.   Mable Paris, FNP

## 2016-12-10 NOTE — Progress Notes (Signed)
Pre visit review using our clinic review tool, if applicable. No additional management support is needed unless otherwise documented below in the visit note. 

## 2017-01-15 ENCOUNTER — Encounter: Payer: Self-pay | Admitting: Family

## 2017-01-15 ENCOUNTER — Telehealth: Payer: Self-pay | Admitting: Family

## 2017-01-20 NOTE — Telephone Encounter (Signed)
close

## 2017-03-07 ENCOUNTER — Ambulatory Visit: Payer: Medicare Other | Admitting: Cardiovascular Disease

## 2017-03-27 NOTE — Progress Notes (Signed)
Cardiology Office Note  Date:  03/28/2017   ID:  Meghan Mack, DOB 03-27-1951, MRN [id number]  PCP:  Burnard Hawthorne, FNP   Chief Complaint  Patient presents with  . other    12 month follow up. Patient denies chest pain and SOB. Meds reviewed verbally with patient.    HPI:  Meghan Mack is a very pleasant 66 year old woman with a history of palpitations,  anxiety,  remote history of chest pain,  hyperlipidemia  Prior 30 day monitor in 2014 showed APCs and short runs of atrial tachycardia/SVT with rate 166 bpm hypertension with a strong family history of coronary artery disease  who presents for routine followup  Of her palpitations and tachycardia.  Rare palpitations, Takes low-dose beta blocker Active, no complaints Went on camping trip for 3 months around the [facility] Wore compression hose for her leg swelling with good effect  active, walking more Weight loss 10 pounds without trying, just eating less Tolerating simvastatin  Lab work reviewed with her, total cholesterol 140, LDL <70  EKG on today's visit shows normal sinus rhythm with rate 71 bpm, no significant ST or T-wave changes     PMH:   has a past medical history of Anemia; Colonic polyp; Constipation; COPD (chronic obstructive pulmonary disease) ([place]); Hyperlipidemia; IBS (irritable bowel syndrome); Nausea; and Palpitations.  PSH:    Past Surgical History:  Procedure Laterality Date  . CARDIAC CATHETERIZATION  02/25/2007  . COLONOSCOPY    . ESOPHAGOGASTRODUODENOSCOPY  10/1997 & 08/2005  . FOOT SURGERY  04/25/2007   right  . THYROIDECTOMY  11/2005  . VAGINAL HYSTERECTOMY     ovaries intact    Current Outpatient Prescriptions  Medication Sig Dispense Refill  . acyclovir ointment (ZOVIRAX) 5 % Apply 1 application topically every 3 (three) hours.     Marland Kitchen azithromycin (ZITHROMAX) 250 MG tablet Tale 500 mg PO on day 1, then 250 mg PO q24h x 4 days. 6 tablet 0  . ergocalciferol (VITAMIN D2) 50000  UNITS capsule Take 50,000 Units by mouth every 30 (thirty) days.    Marland Kitchen levothyroxine (SYNTHROID, LEVOTHROID) 88 MCG tablet Take 1 tablet 6 days a week.    . Multiple Vitamin (MULTIVITAMIN PO) Take 1 tablet by mouth daily.      . nebivolol (BYSTOLIC) 5 MG tablet Take 1 tablet (5 mg total) by mouth daily. 30 tablet 6  . ranitidine (ZANTAC) 150 MG tablet Take 150 mg by mouth as needed.      . simvastatin (ZOCOR) 20 MG tablet TAKE 1 TABLET EVERY EVENING (IMPROVES CHOLESTEROL) 30 tablet 6   No current facility-administered medications for this visit.      Allergies:   Tdap [diphth-acell pertussis-tetanus]; Codeine; Diflucan [fluconazole]; Esomeprazole magnesium; Meperidine hcl; Pneumovax 23 [pneumococcal vac polyvalent]; Propantheline bromide; Propoxyphene n-acetaminophen; and Sulfonamide derivatives   Social History:  The patient  reports that she quit smoking about 19 years ago. Her smoking use included Cigarettes. She has a 2.50 pack-year smoking history. She has never used smokeless tobacco. She reports that she does not drink alcohol or use drugs.   Family History:   family history includes Cancer in her brother, father, maternal aunt, maternal grandfather, and mother; Emphysema in her father; Heart disease in her father.    Review of Systems: Review of Systems  Constitutional: Negative.   Respiratory: Negative.   Cardiovascular: Negative.   Gastrointestinal: Negative.   Musculoskeletal: Negative.   Neurological: Negative.   Psychiatric/Behavioral: Negative.   All other systems  reviewed and are negative.    PHYSICAL EXAM: VS:  BP 118/80 (BP Location: Left Arm, Patient Position: Sitting, Cuff Size: Normal)   Pulse 71   Ht 5\' 7"  (1.702 m)   Wt 147 lb 4 oz (66.8 kg)   BMI 23.06 kg/m  , BMI Body mass index is 23.06 kg/m. GEN: Well nourished, well developed, in no acute distress  HEENT: normal  Neck: no JVD, carotid bruits, or masses Cardiac: RRR; no murmurs, rubs, or gallops,no  edema  Respiratory:  clear to auscultation bilaterally, normal work of breathing GI: soft, nontender, nondistended, + BS MS: no deformity or atrophy  Skin: warm and dry, no rash Neuro:  Strength and sensation are intact Psych: euthymic mood, full affect    Recent Labs: 05/08/2016: ALT 31; BUN 7; Creatinine, Ser 0.77; Hemoglobin 13.2; Platelets 186; Potassium 4.2; Sodium 138; TSH 1.890    Lipid Panel Lab Results  Component Value Date   CHOL 161 05/08/2016   HDL 76 05/08/2016   LDLCALC 70 05/08/2016   TRIG 75 05/08/2016      Wt Readings from Last 3 Encounters:  03/28/17 147 lb 4 oz (66.8 kg)  12/10/16 160 lb 9.6 oz (72.8 kg)  09/11/16 165 lb (74.8 kg)       ASSESSMENT AND PLAN:  Palpitations - Plan: EKG 12-Lead Improved symptoms on low-dose beta blocker  Tachycardia - Plan: EKG 12-Lead Previous event monitor showing 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 She is symptomatic but does not want additional medications at this time. If she has worsening symptoms we could increase her beta blocker. We could even add propranolol as needed. She has this with her   Obstructive sleep apnea - Plan: EKG 12-Lead  TOBACCO ABUSE - Plan: EKG 12-Lead Prior history of smoking  Mixed hyperlipidemia - Plan: EKG 12-Lead Cholesterol is at goal on the current lipid regimen. No changes to the medications were made.   Disposition:   F/U  12 months  Total encounter time more than 25 minutes  Greater than 50% was spent in counseling and coordination of care with the patient    Orders Placed This Encounter  Procedures  . EKG 12-Lead     Signed, Esmond Plants, M.D., Ph.D. 03/28/2017  [place], [place]

## 2017-03-28 ENCOUNTER — Ambulatory Visit (INDEPENDENT_AMBULATORY_CARE_PROVIDER_SITE_OTHER): Payer: Medicare Other | Admitting: Cardiovascular Disease

## 2017-03-28 ENCOUNTER — Encounter: Payer: Self-pay | Admitting: Cardiovascular Disease

## 2017-03-28 VITALS — BP 118/80 | HR 71 | Ht 67.0 in | Wt 147.2 lb

## 2017-03-28 DIAGNOSIS — E782 Mixed hyperlipidemia: Secondary | ICD-10-CM | POA: Diagnosis not present

## 2017-03-28 DIAGNOSIS — G4733 Obstructive sleep apnea (adult) (pediatric): Secondary | ICD-10-CM | POA: Diagnosis not present

## 2017-03-28 DIAGNOSIS — F17200 Nicotine dependence, unspecified, uncomplicated: Secondary | ICD-10-CM | POA: Diagnosis not present

## 2017-03-28 DIAGNOSIS — R002 Palpitations: Secondary | ICD-10-CM

## 2017-03-28 DIAGNOSIS — R000 Tachycardia, unspecified: Secondary | ICD-10-CM | POA: Diagnosis not present

## 2017-03-28 MED ORDER — SIMVASTATIN 20 MG PO TABS: ORAL_TABLET | ORAL | 3 refills | Status: DC

## 2017-03-28 MED ORDER — NEBIVOLOL HCL 5 MG PO TABS: 5.0000 mg | ORAL_TABLET | Freq: Every day | ORAL | 3 refills | Status: DC

## 2017-03-28 NOTE — Patient Instructions (Addendum)

## 2017-03-29 ENCOUNTER — Encounter: Payer: Self-pay | Admitting: Family

## 2017-06-10 ENCOUNTER — Other Ambulatory Visit: Payer: Self-pay | Admitting: Obstetrics and Gynecology

## 2017-06-10 DIAGNOSIS — Z1231 Encounter for screening mammogram for malignant neoplasm of breast: Secondary | ICD-10-CM

## 2017-07-08 ENCOUNTER — Ambulatory Visit (INDEPENDENT_AMBULATORY_CARE_PROVIDER_SITE_OTHER): Payer: Medicare Other | Admitting: Family

## 2017-07-08 ENCOUNTER — Encounter: Payer: Self-pay | Admitting: Family

## 2017-07-08 VITALS — BP 118/84 | HR 88 | Temp 98.2°F | Ht 67.0 in | Wt 148.2 lb

## 2017-07-08 DIAGNOSIS — G4733 Obstructive sleep apnea (adult) (pediatric): Secondary | ICD-10-CM

## 2017-07-08 DIAGNOSIS — E039 Hypothyroidism, unspecified: Secondary | ICD-10-CM

## 2017-07-08 DIAGNOSIS — Z122 Encounter for screening for malignant neoplasm of respiratory organs: Secondary | ICD-10-CM

## 2017-07-08 DIAGNOSIS — Z0000 Encounter for general adult medical examination without abnormal findings: Secondary | ICD-10-CM

## 2017-07-08 DIAGNOSIS — Z0001 Encounter for general adult medical examination with abnormal findings: Secondary | ICD-10-CM | POA: Diagnosis not present

## 2017-07-08 DIAGNOSIS — I471 Supraventricular tachycardia: Secondary | ICD-10-CM

## 2017-07-08 DIAGNOSIS — R1032 Left lower quadrant pain: Secondary | ICD-10-CM | POA: Insufficient documentation

## 2017-07-08 DIAGNOSIS — M25551 Pain in right hip: Secondary | ICD-10-CM

## 2017-07-08 DIAGNOSIS — R1031 Right lower quadrant pain: Secondary | ICD-10-CM | POA: Insufficient documentation

## 2017-07-08 NOTE — Assessment & Plan Note (Signed)
Symptoms consistent with trochanteric bursitis. Agreed to treat conservatively for now. If no improvement, we discussed imaging, PT.

## 2017-07-08 NOTE — Assessment & Plan Note (Addendum)
Symptoms controlled. NO associated CP. Will follow.

## 2017-07-08 NOTE — Progress Notes (Signed)
Subjective:    Patient ID: Meghan Mack, female    DOB: [birth year], 66 y.o.   MRN: [id number]  CC: Meghan Mack is a 66 y.o. female who presents today for physical exam.    HPI: OSA- mild; sleep study results advised non PAP therapies  Hypothyroidism- compliant with synthroid. No cold/heat intolerances.   Tachycardia- one episode several months ago however states very infrequent. Never has CP, sob, diaphoresis, left arm numbness with episode.   Complains of right hip pain for several months, intermittently. Endorses a lot of walking, hiking. Pain when lays on right side. No injury, weakness, numbness tingling. Hasn't tried any medications.No back pain.        Reviewed Dr Donivan Scull note from 3 months ago. Follows patient tachycardia. Tolerating low-dose beta blocker. Event monitor showed tachycardia 160 bpm, short episodes. Patient was not symptomatic, beta blocker was not changed.   Colorectal Cancer Screening: UTD ; adenoma polyp so due in 5 years, 2020 family h/o colon cancer Breast Cancer Screening: Mammogram due- scheduled for 09/2017. Cervical Cancer Screening: UTD Dr Raphael Gibney 2 years ago [place] screening/DEXA for 65+: Declines screening at this time; would like to do next year when pap is due.  Lung Cancer Screening: Doesn't have 30 year pack year history and age > 70 years.       Tetanus - utd        Pneumococcal - Candidate for pneumovax-   Labs: Screening labs today. Exercise: Gets regular exercise.  Alcohol use: none Smoking/tobacco use: Former smoker.  Quit 1998 however has smoked from time to time.  Regular dental exams: UTD Wears seat belt: Yes. Skin: Follows with dermatology; h/o skin cancer- thinks basal cell.   HISTORY:  Past Medical History:  Diagnosis Date  . Anemia   . Colonic polyp   . Constipation   . COPD (chronic obstructive pulmonary disease) ([place])   . Hyperlipidemia   . IBS (irritable bowel syndrome)   . Nausea   . Palpitations     Past Surgical History:  Procedure Laterality Date  . CARDIAC CATHETERIZATION  02/25/2007  . COLONOSCOPY    . ESOPHAGOGASTRODUODENOSCOPY  10/1997 & 08/2005  . FOOT SURGERY  04/25/2007   right  . THYROIDECTOMY  11/2005  . VAGINAL HYSTERECTOMY     ovaries intact   Family History  Problem Relation Age of Onset  . Cancer Mother        colon  . Emphysema Father   . Heart disease Father   . Cancer Father        leukemia  . Cancer Brother        lung  . Cancer Maternal Aunt        melanoma  . Cancer Maternal Grandfather        stomach, colon      ALLERGIES: Tdap [diphth-acell pertussis-tetanus]; Codeine; Diflucan [fluconazole]; Esomeprazole magnesium; Meperidine hcl; Pneumovax 23 [pneumococcal vac polyvalent]; Propantheline bromide; Propoxyphene n-acetaminophen; and Sulfonamide derivatives  Current Outpatient Prescriptions on File Prior to Visit  Medication Sig Dispense Refill  . acyclovir ointment (ZOVIRAX) 5 % Apply 1 application topically every 3 (three) hours.     Marland Kitchen azithromycin (ZITHROMAX) 250 MG tablet Tale 500 mg PO on day 1, then 250 mg PO q24h x 4 days. 6 tablet 0  . ergocalciferol (VITAMIN D2) 50000 UNITS capsule Take 50,000 Units by mouth every 30 (thirty) days.    Marland Kitchen levothyroxine (SYNTHROID, LEVOTHROID) 88 MCG tablet Take 1 tablet 6 days a week.    Marland Kitchen  Multiple Vitamin (MULTIVITAMIN PO) Take 1 tablet by mouth daily.      . nebivolol (BYSTOLIC) 5 MG tablet Take 1 tablet (5 mg total) by mouth daily. 90 tablet 3  . ranitidine (ZANTAC) 150 MG tablet Take 150 mg by mouth as needed.      . simvastatin (ZOCOR) 20 MG tablet TAKE 1 TABLET EVERY EVENING (IMPROVES CHOLESTEROL) 90 tablet 3   No current facility-administered medications on file prior to visit.     Social History  Substance Use Topics  . Smoking status: Former Smoker    Packs/day: 0.25    Years: 10.00    Types: Cigarettes    Quit date: 09/25/1997  . Smokeless tobacco: Never Used  . Alcohol use No     Review of Systems  Constitutional: Negative for chills, fever and unexpected weight change.  HENT: Negative for congestion.   Respiratory: Negative for cough.   Cardiovascular: Positive for palpitations. Negative for chest pain and leg swelling.  Gastrointestinal: Negative for nausea and vomiting.  Musculoskeletal: Negative for arthralgias, back pain and myalgias.  Skin: Negative for rash.  Neurological: Negative for headaches.  Hematological: Negative for adenopathy.  Psychiatric/Behavioral: Negative for confusion.      Objective:    BP 118/84   Pulse 88   Temp 98.2 F (36.8 C) (Oral)   Ht 5\' 7"  (1.702 m)   Wt 148 lb 3.2 oz (67.2 kg)   SpO2 94%   BMI 23.21 kg/m   BP Readings from Last 3 Encounters:  07/08/17 118/84  03/28/17 118/80  12/10/16 134/90   Wt Readings from Last 3 Encounters:  07/08/17 148 lb 3.2 oz (67.2 kg)  03/28/17 147 lb 4 oz (66.8 kg)  12/10/16 160 lb 9.6 oz (72.8 kg)    Physical Exam  Constitutional: She appears well-developed and well-nourished.  Eyes: Conjunctivae are normal.  Neck: No thyroid mass and no thyromegaly present.  Cardiovascular: Normal rate, regular rhythm, normal heart sounds and normal pulses.   Pulmonary/Chest: Effort normal and breath sounds normal. She has no wheezes. She has no rhonchi. She has no rales. Right breast exhibits no inverted nipple, no mass, no nipple discharge, no skin change and no tenderness. Left breast exhibits no inverted nipple, no mass, no nipple discharge, no skin change and no tenderness. Breasts are symmetrical.  CBE performed.   Musculoskeletal:       Right hip: She exhibits decreased strength and tenderness. She exhibits normal range of motion, no bony tenderness and no swelling.       Lumbar back: She exhibits normal range of motion, no tenderness, no bony tenderness, no swelling, no edema, no pain and no spasm.       Legs: Full range of motion with flexion, tension, lateral side bends. No bony  tenderness. No pain, numbness, tingling elicited with single leg raise bilaterally.   Right Hip: No limp or waddling gait. Full ROM with flexion and hip rotation in flexion.   Pain deep palpation of greater trochanter.     Lymphadenopathy:       Head (right side): No submental, no submandibular, no tonsillar, no preauricular, no posterior auricular and no occipital adenopathy present.       Head (left side): No submental, no submandibular, no tonsillar, no preauricular, no posterior auricular and no occipital adenopathy present.    She has no cervical adenopathy.       Right cervical: No superficial cervical, no deep cervical and no posterior cervical adenopathy present.  Left cervical: No superficial cervical, no deep cervical and no posterior cervical adenopathy present.    She has no axillary adenopathy.  Neurological: She is alert. She has normal strength. No sensory deficit.  Reflex Scores:      Patellar reflexes are 2+ on the right side and 2+ on the left side. Sensation and strength intact bilateral lower extremities.  Skin: Skin is warm and dry.  Psychiatric: She has a normal mood and affect. Her speech is normal and behavior is normal. Thought content normal.  Vitals reviewed.      Assessment & Plan:   Problem List Items Addressed This Visit      Cardiovascular and Mediastinum   Paroxysmal supraventricular tachycardia (HCC)    Symptoms controlled. NO associated CP. Will follow.         Respiratory   Obstructive sleep apnea    No CPAP machine. Controlled with lifestyle modifications.        Endocrine   Hypothyroidism    Stable. Pending tsh        Other   Routine general medical examination at a health care facility - Primary    Mammogram is scheduled. Up-to-date on colonoscopy, Pap smear. CBE performed today. Advised candidate for Pneumovax  and patient would like to think about this. She is getting her flu shot and the pharmacy. CT lung scan ordered due  to past history of smoking. Current exercise. Labs ordered.      Relevant Orders   CT CHEST LUNG CA SCREEN LOW DOSE W/O CM   CBC with Differential/Platelet   Comprehensive metabolic panel   Hemoglobin A1c   Lipid panel   TSH   VITAMIN D 25 Hydroxy (Vit-D Deficiency, Fractures)   Right hip pain    Symptoms consistent with trochanteric bursitis. Agreed to treat conservatively for now. If no improvement, we discussed imaging, PT.        Other Visit Diagnoses    Encounter for screening for malignant neoplasm of respiratory organs       Relevant Orders   CT CHEST LUNG CA SCREEN LOW DOSE W/O CM       I am having Ms. Clippinger maintain her Multiple Vitamin (MULTIVITAMIN PO), levothyroxine, ranitidine, ergocalciferol, acyclovir ointment, azithromycin, nebivolol, and simvastatin.   No orders of the defined types were placed in this encounter.   Return precautions given.   Risks, benefits, and alternatives of the medications and treatment plan prescribed today were discussed, and patient expressed understanding.   Education regarding symptom management and diagnosis given to patient on AVS.   Continue to follow with Burnard Hawthorne, FNP for routine health maintenance.   Luberta Robertson and I agreed with plan.   Mable Paris, FNP

## 2017-07-08 NOTE — Patient Instructions (Addendum)
Consider shingrex series and pneumvax. Let me know.   Labs -future  Ensure you have mammogram in December  Suspect pain is trochanteric bursitis.   Let me know how aspercreme, heat/ice, exercises help.   CT lung scan  Trochanteric Bursitis Rehab Ask your health care provider which exercises are safe for you. Do exercises exactly as told by your health care provider and adjust them as directed. It is normal to feel mild stretching, pulling, tightness, or discomfort as you do these exercises, but you should stop right away if you feel sudden pain or your pain gets worse.Do not begin these exercises until told by your health care provider. Stretching exercises These exercises warm up your muscles and joints and improve the movement and flexibility of your hip. These exercises also help to relieve pain and stiffness. Exercise A: Iliotibial band stretch  1. Lie on your side with your left / right leg in the top position. 2. Bend your left / right knee and grab your ankle. 3. Slowly bring your knee back so your thigh is behind your body. 4. Slowly lower your knee toward the floor until you feel a gentle stretch on the outside of your left / right thigh. If you do not feel a stretch and your knee will not fall farther, place the heel of your other foot on top of your outer knee and pull your thigh down farther. 5. Hold this position for __________ seconds. 6. Slowly return to the starting position. Repeat __________ times. Complete this exercise __________ times a day. Strengthening exercises These exercises build strength and endurance in your hip and pelvis. Endurance is the ability to use your muscles for a long time, even after they get tired. Exercise B: Bridge ( hip extensors) 1. Lie on your back on a firm surface with your knees bent and your feet flat on the floor. 2. Tighten your buttocks muscles and lift your buttocks off the floor until your trunk is level with your thighs. You  should feel the muscles working in your buttocks and the back of your thighs. If this exercise is too easy, try doing it with your arms crossed over your chest. 3. Hold this position for __________ seconds. 4. Slowly return to the starting position. 5. Let your muscles relax completely between repetitions. Repeat __________ times. Complete this exercise __________ times a day. Exercise C: Squats ( knee extensors and  quadriceps) 1. Stand in front of a table, with your feet and knees pointing straight ahead. You may rest your hands on the table for balance but not for support. 2. Slowly bend your knees and lower your hips like you are going to sit in a chair. ? Keep your weight over your heels, not over your toes. ? Keep your lower legs upright so they are parallel with the table legs. ? Do not let your hips go lower than your knees. ? Do not bend lower than told by your health care provider. ? If your hip pain increases, do not bend as low. 3. Hold this position for __________ seconds. 4. Slowly push with your legs to return to standing. Do not use your hands to pull yourself to standing. Repeat __________ times. Complete this exercise __________ times a day. Exercise D: Hip hike 1. Stand sideways on a bottom step. Stand on your left / right leg with your other foot unsupported next to the step. You can hold onto the railing or wall if needed for balance. 2. Keeping your  knees straight and your torso square, lift your left / right hip up toward the ceiling. 3. Hold this position for __________ seconds. 4. Slowly let your left / right hip lower toward the floor, past the starting position. Your foot should get closer to the floor. Do not lean or bend your knees. Repeat __________ times. Complete this exercise __________ times a day. Exercise E: Single leg stand 1. Stand near a counter or door frame that you can hold onto for balance as needed. It is helpful to stand in front of a mirror for  this exercise so you can watch your hip. 2. Squeeze your left / right buttock muscles then lift up your other foot. Do not let your left / right hip push out to the side. 3. Hold this position for __________ seconds. Repeat __________ times. Complete this exercise __________ times a day. This information is not intended to replace advice given to you by your health care provider. Make sure you discuss any questions you have with your health care provider. Document Released: 11/08/2004 Document Revised: 06/07/2016 Document Reviewed: 09/16/2015 Elsevier Interactive Patient Education  2018 [place] Maintenance for Postmenopausal Women Menopause is a normal process in which your reproductive ability comes to an end. This process happens gradually over a span of months to years, usually between the ages of 63 and 39. Menopause is complete when you have missed 12 consecutive menstrual periods. It is important to talk with your health care provider about some of the most common conditions that affect postmenopausal women, such as heart disease, cancer, and bone loss (osteoporosis). Adopting a healthy lifestyle and getting preventive care can help to promote your health and wellness. Those actions can also lower your chances of developing some of these common conditions. What should I know about menopause? During menopause, you may experience a number of symptoms, such as:  Moderate-to-severe hot flashes.  Night sweats.  Decrease in sex drive.  Mood swings.  Headaches.  Tiredness.  Irritability.  Memory problems.  Insomnia.  Choosing to treat or not to treat menopausal changes is an individual decision that you make with your health care provider. What should I know about hormone replacement therapy and supplements? Hormone therapy products are effective for treating symptoms that are associated with menopause, such as hot flashes and night sweats. Hormone replacement  carries certain risks, especially as you become older. If you are thinking about using estrogen or estrogen with progestin treatments, discuss the benefits and risks with your health care provider. What should I know about heart disease and stroke? Heart disease, heart attack, and stroke become more likely as you age. This may be due, in part, to the hormonal changes that your body experiences during menopause. These can affect how your body processes dietary fats, triglycerides, and cholesterol. Heart attack and stroke are both medical emergencies. There are many things that you can do to help prevent heart disease and stroke:  Have your blood pressure checked at least every 1-2 years. High blood pressure causes heart disease and increases the risk of stroke.  If you are 67-72 years old, ask your health care provider if you should take aspirin to prevent a heart attack or a stroke.  Do not use any tobacco products, including cigarettes, chewing tobacco, or electronic cigarettes. If you need help quitting, ask your health care provider.  It is important to eat a healthy diet and maintain a healthy weight. ? Be sure to include plenty of  vegetables, fruits, low-fat dairy products, and lean protein. ? Avoid eating foods that are high in solid fats, added sugars, or salt (sodium).  Get regular exercise. This is one of the most important things that you can do for your health. ? Try to exercise for at least 150 minutes each week. The type of exercise that you do should increase your heart rate and make you sweat. This is known as moderate-intensity exercise. ? Try to do strengthening exercises at least twice each week. Do these in addition to the moderate-intensity exercise.  Know your numbers.Ask your health care provider to check your cholesterol and your blood glucose. Continue to have your blood tested as directed by your health care provider.  What should I know about cancer screening? There  are several types of cancer. Take the following steps to reduce your risk and to catch any cancer development as early as possible. Breast Cancer  Practice breast self-awareness. ? This means understanding how your breasts normally appear and feel. ? It also means doing regular breast self-exams. Let your health care provider know about any changes, no matter how small.  If you are 7 or older, have a clinician do a breast exam (clinical breast exam or CBE) every year. Depending on your age, family history, and medical history, it may be recommended that you also have a yearly breast X-ray (mammogram).  If you have a family history of breast cancer, talk with your health care provider about genetic screening.  If you are at high risk for breast cancer, talk with your health care provider about having an MRI and a mammogram every year.  Breast cancer (BRCA) gene test is recommended for women who have family members with BRCA-related cancers. Results of the assessment will determine the need for genetic counseling and BRCA1 and for BRCA2 testing. BRCA-related cancers include these types: ? Breast. This occurs in males or females. ? Ovarian. ? Tubal. This may also be called fallopian tube cancer. ? Cancer of the abdominal or pelvic lining (peritoneal cancer). ? Prostate. ? Pancreatic.  Cervical, Uterine, and Ovarian Cancer Your health care provider may recommend that you be screened regularly for cancer of the pelvic organs. These include your ovaries, uterus, and vagina. This screening involves a pelvic exam, which includes checking for microscopic changes to the surface of your cervix (Pap test).  For women ages 21-65, health care providers may recommend a pelvic exam and a Pap test every three years. For women ages 48-65, they may recommend the Pap test and pelvic exam, combined with testing for human papilloma virus (HPV), every five years. Some types of HPV increase your risk of cervical  cancer. Testing for HPV may also be done on women of any age who have unclear Pap test results.  Other health care providers may not recommend any screening for nonpregnant women who are considered low risk for pelvic cancer and have no symptoms. Ask your health care provider if a screening pelvic exam is right for you.  If you have had past treatment for cervical cancer or a condition that could lead to cancer, you need Pap tests and screening for cancer for at least 20 years after your treatment. If Pap tests have been discontinued for you, your risk factors (such as having a new sexual partner) need to be reassessed to determine if you should start having screenings again. Some women have medical problems that increase the chance of getting cervical cancer. In these cases, your health care  provider may recommend that you have screening and Pap tests more often.  If you have a family history of uterine cancer or ovarian cancer, talk with your health care provider about genetic screening.  If you have vaginal bleeding after reaching menopause, tell your health care provider.  There are currently no reliable tests available to screen for ovarian cancer.  Lung Cancer Lung cancer screening is recommended for adults 19-79 years old who are at high risk for lung cancer because of a history of smoking. A yearly low-dose CT scan of the lungs is recommended if you:  Currently smoke.  Have a history of at least 30 pack-years of smoking and you currently smoke or have quit within the past 15 years. A pack-year is smoking an average of one pack of cigarettes per day for one year.  Yearly screening should:  Continue until it has been 15 years since you quit.  Stop if you develop a health problem that would prevent you from having lung cancer treatment.  Colorectal Cancer  This type of cancer can be detected and can often be prevented.  Routine colorectal cancer screening usually begins at age 63  and continues through age 34.  If you have risk factors for colon cancer, your health care provider may recommend that you be screened at an earlier age.  If you have a family history of colorectal cancer, talk with your health care provider about genetic screening.  Your health care provider may also recommend using home test kits to check for hidden blood in your stool.  A small camera at the end of a tube can be used to examine your colon directly (sigmoidoscopy or colonoscopy). This is done to check for the earliest forms of colorectal cancer.  Direct examination of the colon should be repeated every 5-10 years until age 22. However, if early forms of precancerous polyps or small growths are found or if you have a family history or genetic risk for colorectal cancer, you may need to be screened more often.  Skin Cancer  Check your skin from head to toe regularly.  Monitor any moles. Be sure to tell your health care provider: ? About any new moles or changes in moles, especially if there is a change in a mole's shape or color. ? If you have a mole that is larger than the size of a pencil eraser.  If any of your family members has a history of skin cancer, especially at a young age, talk with your health care provider about genetic screening.  Always use sunscreen. Apply sunscreen liberally and repeatedly throughout the day.  Whenever you are outside, protect yourself by wearing long sleeves, pants, a wide-brimmed hat, and sunglasses.  What should I know about osteoporosis? Osteoporosis is a condition in which bone destruction happens more quickly than new bone creation. After menopause, you may be at an increased risk for osteoporosis. To help prevent osteoporosis or the bone fractures that can happen because of osteoporosis, the following is recommended:  If you are 70-54 years old, get at least 1,000 mg of calcium and at least 600 mg of vitamin D per day.  If you are older than  age 64 but younger than age 72, get at least 1,200 mg of calcium and at least 600 mg of vitamin D per day.  If you are older than age 48, get at least 1,200 mg of calcium and at least 800 mg of vitamin D per day.  Smoking and  excessive alcohol intake increase the risk of osteoporosis. Eat foods that are rich in calcium and vitamin D, and do weight-bearing exercises several times each week as directed by your health care provider. What should I know about how menopause affects my mental health? Depression may occur at any age, but it is more common as you become older. Common symptoms of depression include:  Low or sad mood.  Changes in sleep patterns.  Changes in appetite or eating patterns.  Feeling an overall lack of motivation or enjoyment of activities that you previously enjoyed.  Frequent crying spells.  Talk with your health care provider if you think that you are experiencing depression. What should I know about immunizations? It is important that you get and maintain your immunizations. These include:  Tetanus, diphtheria, and pertussis (Tdap) booster vaccine.  Influenza every year before the flu season begins.  Pneumonia vaccine.  Shingles vaccine.  Your health care provider may also recommend other immunizations. This information is not intended to replace advice given to you by your health care provider. Make sure you discuss any questions you have with your health care provider. Document Released: 11/23/2005 Document Revised: 04/20/2016 Document Reviewed: 07/05/2015 Elsevier Interactive Patient Education  2018 [employer].

## 2017-07-08 NOTE — Progress Notes (Signed)
Pre visit review using our clinic review tool, if applicable. No additional management support is needed unless otherwise documented below in the visit note. 

## 2017-07-08 NOTE — Assessment & Plan Note (Signed)
Mammogram is scheduled. Up-to-date on colonoscopy, Pap smear. CBE performed today. Advised candidate for Pneumovax  and patient would like to think about this. She is getting her flu shot and the pharmacy. CT lung scan ordered due to past history of smoking. Current exercise. Labs ordered.

## 2017-07-08 NOTE — Assessment & Plan Note (Signed)
No CPAP machine. Controlled with lifestyle modifications.

## 2017-07-08 NOTE — Assessment & Plan Note (Signed)
Stable. Pending tsh

## 2017-07-10 ENCOUNTER — Telehealth: Payer: Self-pay | Admitting: *Deleted

## 2017-07-10 NOTE — Telephone Encounter (Signed)
Received referral for low dose lung cancer screening CT scan. Message left at phone number listed in EMR for patient to call me back to facilitate scheduling scan.  

## 2017-07-15 ENCOUNTER — Telehealth: Payer: Self-pay

## 2017-07-15 DIAGNOSIS — Z0000 Encounter for general adult medical examination without abnormal findings: Secondary | ICD-10-CM

## 2017-07-15 NOTE — Telephone Encounter (Signed)
Reordering lab draw to be from [place].

## 2017-07-16 ENCOUNTER — Telehealth: Payer: Self-pay | Admitting: *Deleted

## 2017-07-16 LAB — COMPREHENSIVE METABOLIC PANEL
ALT: 22 IU/L (ref 0–32)
AST: 22 IU/L (ref 0–40)
Albumin/Globulin Ratio: 1.5 (ref 1.2–2.2)
Albumin: 4.1 g/dL (ref 3.6–4.8)
Alkaline Phosphatase: 112 IU/L (ref 39–117)
BUN/Creatinine Ratio: 9 — ABNORMAL LOW (ref 12–28)
BUN: 7 mg/dL — ABNORMAL LOW (ref 8–27)
Bilirubin Total: 0.6 mg/dL (ref 0.0–1.2)
CO2: 27 mmol/L (ref 20–29)
Calcium: 9.3 mg/dL (ref 8.7–10.3)
Chloride: 105 mmol/L (ref 96–106)
Creatinine, Ser: 0.74 mg/dL (ref 0.57–1.00)
GFR calc Af Amer: 97 mL/min/{1.73_m2} (ref >59)
GFR calc non Af Amer: 84 mL/min/{1.73_m2} (ref >59)
Globulin, Total: 2.7 g/dL (ref 1.5–4.5)
Glucose: 91 mg/dL (ref 65–99)
Potassium: 4.1 mmol/L (ref 3.5–5.2)
Sodium: 143 mmol/L (ref 134–144)
Total Protein: 6.8 g/dL (ref 6.0–8.5)

## 2017-07-16 LAB — CBC WITH DIFFERENTIAL/PLATELET
Basophils Absolute: 0 10*3/uL (ref 0.0–0.2)
Basos: 0 %
EOS (ABSOLUTE): 0.1 10*3/uL (ref 0.0–0.4)
Eos: 1 %
Hematocrit: 39.9 % (ref 34.0–46.6)
Hemoglobin: 13.3 g/dL (ref 11.1–15.9)
Immature Grans (Abs): 0 10*3/uL (ref 0.0–0.1)
Immature Granulocytes: 0 %
Lymphocytes Absolute: 1 10*3/uL (ref 0.7–3.1)
Lymphs: 24 %
MCH: 32 pg (ref 26.6–33.0)
MCHC: 33.3 g/dL (ref 31.5–35.7)
MCV: 96 fL (ref 79–97)
Monocytes Absolute: 0.4 10*3/uL (ref 0.1–0.9)
Monocytes: 9 %
Neutrophils Absolute: 2.8 10*3/uL (ref 1.4–7.0)
Neutrophils: 66 %
Platelets: 186 10*3/uL (ref 150–379)
RBC: 4.16 x10E6/uL (ref 3.77–5.28)
RDW: 12.8 % (ref 12.3–15.4)
WBC: 4.2 10*3/uL (ref 3.4–10.8)

## 2017-07-16 LAB — LIPID PANEL
Chol/HDL Ratio: 2.1 ratio (ref 0.0–4.4)
Cholesterol, Total: 154 mg/dL (ref 100–199)
HDL: 73 mg/dL (ref >39)
LDL Calculated: 57 mg/dL (ref 0–99)
Triglycerides: 120 mg/dL (ref 0–149)
VLDL Cholesterol Cal: 24 mg/dL (ref 5–40)

## 2017-07-16 LAB — VITAMIN D 25 HYDROXY (VIT D DEFICIENCY, FRACTURES): Vit D, 25-Hydroxy: 43.4 ng/mL (ref 30.0–100.0)

## 2017-07-16 LAB — HEMOGLOBIN A1C
Est. average glucose Bld gHb Est-mCnc: 108 mg/dL
Hgb A1c MFr Bld: 5.4 % (ref 4.8–5.6)

## 2017-07-16 LAB — TSH: TSH: 0.99 u[IU]/mL (ref 0.450–4.500)

## 2017-07-16 NOTE — Telephone Encounter (Signed)
Received referral for lung screening CT scan. Patient quit smoking in 1998. Patient is not a candidate for lung screening due to quit date >15 years ago. Patient verbalizes understanding.

## 2017-07-23 ENCOUNTER — Other Ambulatory Visit: Payer: Self-pay | Admitting: Cardiovascular Disease

## 2017-09-10 ENCOUNTER — Ambulatory Visit: Payer: Medicare Other | Admitting: Family Medicine

## 2017-09-10 ENCOUNTER — Other Ambulatory Visit: Payer: Self-pay

## 2017-09-10 ENCOUNTER — Encounter: Payer: Self-pay | Admitting: Family Medicine

## 2017-09-10 VITALS — BP 124/82 | HR 96 | Temp 97.8°F | Wt 147.8 lb

## 2017-09-10 DIAGNOSIS — R8290 Unspecified abnormal findings in urine: Secondary | ICD-10-CM | POA: Diagnosis not present

## 2017-09-10 DIAGNOSIS — R109 Unspecified abdominal pain: Secondary | ICD-10-CM | POA: Diagnosis not present

## 2017-09-10 DIAGNOSIS — J029 Acute pharyngitis, unspecified: Secondary | ICD-10-CM | POA: Diagnosis not present

## 2017-09-10 LAB — COMPREHENSIVE METABOLIC PANEL
ALT: 18 U/L (ref 0–35)
AST: 21 U/L (ref 0–37)
Albumin: 4.2 g/dL (ref 3.5–5.2)
Alkaline Phosphatase: 96 U/L (ref 39–117)
BUN: 7 mg/dL (ref 6–23)
CO2: 30 mEq/L (ref 19–32)
Calcium: 9.5 mg/dL (ref 8.4–10.5)
Chloride: 104 mEq/L (ref 96–112)
Creatinine, Ser: 0.69 mg/dL (ref 0.40–1.20)
GFR: 90.05 mL/min (ref >60.00)
Glucose, Bld: 98 mg/dL (ref 70–99)
Potassium: 3.9 mEq/L (ref 3.5–5.1)
Sodium: 140 mEq/L (ref 135–145)
Total Bilirubin: 0.6 mg/dL (ref 0.2–1.2)
Total Protein: 7.2 g/dL (ref 6.0–8.3)

## 2017-09-10 LAB — CBC WITH DIFFERENTIAL/PLATELET
Basophils Absolute: 0 10*3/uL (ref 0.0–0.1)
Basophils Relative: 0.6 % (ref 0.0–3.0)
Eosinophils Absolute: 0.1 10*3/uL (ref 0.0–0.7)
Eosinophils Relative: 1.7 % (ref 0.0–5.0)
HCT: 39.4 % (ref 36.0–46.0)
Hemoglobin: 13.2 g/dL (ref 12.0–15.0)
Lymphocytes Relative: 29.8 % (ref 12.0–46.0)
Lymphs Abs: 1.4 10*3/uL (ref 0.7–4.0)
MCHC: 33.5 g/dL (ref 30.0–36.0)
MCV: 97.7 fl (ref 78.0–100.0)
Monocytes Absolute: 0.4 10*3/uL (ref 0.1–1.0)
Monocytes Relative: 7.7 % (ref 3.0–12.0)
Neutro Abs: 2.8 10*3/uL (ref 1.4–7.7)
Neutrophils Relative %: 60.2 % (ref 43.0–77.0)
Platelets: 174 10*3/uL (ref 150.0–400.0)
RBC: 4.03 Mil/uL (ref 3.87–5.11)
RDW: 12.7 % (ref 11.5–15.5)
WBC: 4.6 10*3/uL (ref 4.0–10.5)

## 2017-09-10 LAB — LIPASE: Lipase: 19 U/L (ref 11.0–59.0)

## 2017-09-10 LAB — POCT URINALYSIS DIPSTICK
Bilirubin, UA: NEGATIVE
Glucose, UA: NEGATIVE
Ketones, UA: NEGATIVE
Leukocytes, UA: NEGATIVE
Nitrite, UA: NEGATIVE
Protein, UA: NEGATIVE
Spec Grav, UA: <=1.005 — AB (ref 1.010–1.025)
Urobilinogen, UA: 0.2 E.U./dL
pH, UA: 6 (ref 5.0–8.0)

## 2017-09-10 LAB — URINALYSIS, MICROSCOPIC ONLY
RBC / HPF: NONE SEEN (ref 0–?)
WBC, UA: NONE SEEN (ref 0–?)

## 2017-09-10 NOTE — Assessment & Plan Note (Signed)
Suspect patient's abdominal discomfort is related to constipation given strong history of this.  She did go camping though she has no diarrhea.  Could potentially also have some level of gastritis.  Will check lab work as outlined below to evaluate for infectious cause or other cause.  We will check a urinalysis given her dysuria.  If these are unremarkable we will treat her for constipation.  We will check and make sure she can take Zantac with her Synthroid.  If she can we will treat with Zantac for possible gastritis.  Given return precautions.

## 2017-09-10 NOTE — Patient Instructions (Signed)
Nice to see you. We will check lab work and your urine today. We will contact you with the results. I will check with our pharmacist Zantac and Synthroid. If you develop worsening symptoms or any new symptoms please be evaluated.

## 2017-09-10 NOTE — Addendum Note (Signed)
Addended by: Leeanne Rio on: 09/10/2017 01:13 PM   Modules accepted: Orders

## 2017-09-10 NOTE — Progress Notes (Signed)
Tommi Rumps, MD Phone: [phone]  Meghan Mack is a 66 y.o. female who presents today for same-day visit.  Patient notes for the last 4 days she has had sore throat.  Notes that she was scratchy at this point.  She has a history of right tonsillar cysts which she has seen ENT for.  She notes some red spots on her left tonsils.  No fevers.  No trouble swallowing.  Some mild cough.  Some reflux.  No allergy history.  She does note for several weeks she has had intermittent left-sided abdominal discomfort and gas.  She did go camping most recently 2 weeks ago.  She notes some nausea and bloated sensation with eating.  Notes she burps with drinking water.  She did have some tenderness and soreness around her navel and some drainage with bleeding from her navel previously.  She was evaluated by urgent care for that with no obvious cause.  No abnormalities today.  She notes no diarrhea.  No vomiting.  No blood in her stool.  She does note a history of constipation she was advised to take Dulcolax daily previously though has not been doing this.  She had a small bowel movement this morning.  Took Dulcolax 4 days ago.  Some dysuria.  No frequency or urgency.  No fevers.  She is status post hysterectomy.  Social History   Tobacco Use  Smoking Status Former Smoker  . Packs/day: 0.25  . Years: 10.00  . Pack years: 2.50  . Types: Cigarettes  . Last attempt to quit: 09/25/1997  . Years since quitting: 19.9  Smokeless Tobacco Never Used     ROS see history of present illness  Objective  Physical Exam Vitals:   09/10/17 0946  BP: 124/82  Pulse: 96  Temp: 97.8 F (36.6 C)  SpO2: 98%    BP Readings from Last 3 Encounters:  09/10/17 124/82  07/08/17 118/84  03/28/17 118/80   Wt Readings from Last 3 Encounters:  09/10/17 147 lb 12.8 oz (67 kg)  07/08/17 148 lb 3.2 oz (67.2 kg)  03/28/17 147 lb 4 oz (66.8 kg)    Physical Exam  Constitutional: No distress.  HENT:  Head:  Normocephalic and atraumatic.  Mouth/Throat: No oropharyngeal exudate.  Right tonsillar cyst noted, left tonsil appears normal, minimal posterior oropharyngeal erythema  Eyes: Conjunctivae are normal. Pupils are equal, round, and reactive to light.  Cardiovascular: Normal rate, regular rhythm and normal heart sounds.  Pulmonary/Chest: Effort normal and breath sounds normal.  Abdominal: Soft. Bowel sounds are normal. She exhibits no distension. There is tenderness (left mid and upper quadrant). There is no rebound and no guarding.  Musculoskeletal: She exhibits no edema.  Neurological: She is alert. Gait normal.  Skin: Skin is warm and dry. She is not diaphoretic.  No abnormality of her umbilicus   Assessment/Plan: Please see individual problem list.  Abdominal pain Suspect patient's abdominal discomfort is related to constipation given strong history of this.  She did go camping though she has no diarrhea.  Could potentially also have some level of gastritis.  Will check lab work as outlined below to evaluate for infectious cause or other cause.  We will check a urinalysis given her dysuria.  If these are unremarkable we will treat her for constipation.  We will check and make sure she can take Zantac with her Synthroid.  If she can we will treat with Zantac for possible gastritis.  Given return precautions.  Sore throat Suspect  viral illness versus related to reflux.  Will check to see if she can take Zantac with her Synthroid.  She will monitor.  If not improving consider ENT evaluation.   Audreana was seen today for sore throat.  Diagnoses and all orders for this visit:  Abdominal pain, unspecified abdominal location -     Comp Met (CMET) -     CBC w/Diff -     Lipase -     POCT Urinalysis Dipstick  Sore throat    Orders Placed This Encounter  Procedures  . Comp Met (CMET)  . CBC w/Diff  . Lipase  . POCT Urinalysis Dipstick    No orders of the defined types were placed in  this encounter.    Tommi Rumps, MD [place]

## 2017-09-10 NOTE — Assessment & Plan Note (Signed)
Suspect viral illness versus related to reflux.  Will check to see if she can take Zantac with her Synthroid.  She will monitor.  If not improving consider ENT evaluation.

## 2017-09-11 LAB — URINE CULTURE
MICRO NUMBER:: 81330348
SPECIMEN QUALITY:: ADEQUATE

## 2017-09-14 ENCOUNTER — Telehealth: Payer: Self-pay | Admitting: Family Medicine

## 2017-09-14 NOTE — Telephone Encounter (Signed)
Please let the patient know that she needs to separate taking the Zantac from the Synthroid by 4 hours.  She needs to take the Synthroid at least 30 minutes prior to eating anything.  Thanks..  Thanks.

## 2017-09-14 NOTE — Telephone Encounter (Signed)
-----   Message from Carlean Jews, [facility] sent at 09/10/2017  2:07 PM EST ----- Technically, no - anything that lowers the pH of the stomach will decrease absorption of synthroid. She should separate the drugs by 4 hours and take  At least 30 minutes prior to eating anything.  That being said, if she has been taking them together and her hypothyroid is controlled, I don't worry about it.   Chrys Racer ----- Message ----- From: Leone Haven, MD Sent: 09/10/2017  10:22 AM To: Carlean Jews, [place],  This patient questioned whether or not she could take zantac at the same time as her synthroid. I thought this would be ok though wondered if the zantac could affect synthroid absorption, though wanted to see what you thought. Thanks.  Randall Hiss

## 2017-09-16 NOTE — Telephone Encounter (Signed)
Noted. She should keep the appointment with GI. If not improving or if she worsens she should be rechecked.

## 2017-09-16 NOTE — Telephone Encounter (Signed)
Patient notified, she states she is not having any more constipation but she is still really bloated and has a constant "full" feeling. She states is not able to see GI until 10/10/17

## 2017-09-16 NOTE — Telephone Encounter (Signed)
Patient notified

## 2017-09-17 ENCOUNTER — Other Ambulatory Visit: Payer: Self-pay | Admitting: Student

## 2017-09-17 DIAGNOSIS — R1033 Periumbilical pain: Secondary | ICD-10-CM

## 2017-09-17 DIAGNOSIS — R141 Gas pain: Secondary | ICD-10-CM

## 2017-09-17 DIAGNOSIS — R110 Nausea: Secondary | ICD-10-CM

## 2017-09-26 ENCOUNTER — Ambulatory Visit
Admission: RE | Admit: 2017-09-26 | Discharge: 2017-09-26 | Disposition: A | Discharge status: Home / Self Care | Payer: Medicare Other | Source: Ambulatory Visit | Attending: Student | Admitting: Student

## 2017-09-26 DIAGNOSIS — R1033 Periumbilical pain: Secondary | ICD-10-CM | POA: Insufficient documentation

## 2017-09-26 DIAGNOSIS — R141 Gas pain: Secondary | ICD-10-CM

## 2017-09-26 DIAGNOSIS — R110 Nausea: Secondary | ICD-10-CM | POA: Insufficient documentation

## 2017-10-01 ENCOUNTER — Other Ambulatory Visit: Payer: Self-pay | Admitting: Student

## 2017-10-01 DIAGNOSIS — R110 Nausea: Secondary | ICD-10-CM

## 2017-10-01 DIAGNOSIS — R1033 Periumbilical pain: Secondary | ICD-10-CM

## 2017-10-04 ENCOUNTER — Ambulatory Visit
Admission: RE | Admit: 2017-10-04 | Discharge: 2017-10-04 | Disposition: A | Discharge status: Home / Self Care | Payer: Medicare Other | Source: Ambulatory Visit | Attending: Obstetrics and Gynecology | Admitting: Obstetrics and Gynecology

## 2017-10-04 DIAGNOSIS — Z1231 Encounter for screening mammogram for malignant neoplasm of breast: Secondary | ICD-10-CM

## 2017-10-09 ENCOUNTER — Other Ambulatory Visit: Payer: Self-pay | Admitting: Obstetrics and Gynecology

## 2017-10-09 DIAGNOSIS — R928 Other abnormal and inconclusive findings on diagnostic imaging of breast: Secondary | ICD-10-CM

## 2017-10-14 ENCOUNTER — Ambulatory Visit
Admission: RE | Admit: 2017-10-14 | Discharge: 2017-10-14 | Disposition: A | Discharge status: Home / Self Care | Payer: Medicare Other | Source: Ambulatory Visit | Attending: Student | Admitting: Student

## 2017-10-14 DIAGNOSIS — I700 Atherosclerosis of aorta: Secondary | ICD-10-CM | POA: Insufficient documentation

## 2017-10-14 DIAGNOSIS — R110 Nausea: Secondary | ICD-10-CM | POA: Insufficient documentation

## 2017-10-14 DIAGNOSIS — R918 Other nonspecific abnormal finding of lung field: Secondary | ICD-10-CM | POA: Insufficient documentation

## 2017-10-14 DIAGNOSIS — R1033 Periumbilical pain: Secondary | ICD-10-CM | POA: Diagnosis present

## 2017-10-14 MED ORDER — IOPAMIDOL (ISOVUE-300) INJECTION 61%
100.0000 mL | Freq: Once | INTRAVENOUS | Status: AC | PRN
  Administered 2017-10-14: 100 mL via INTRAVENOUS

## 2017-10-16 ENCOUNTER — Ambulatory Visit
Admission: RE | Admit: 2017-10-16 | Discharge: 2017-10-16 | Disposition: A | Discharge status: Home / Self Care | Payer: Medicare Other | Source: Ambulatory Visit | Attending: Obstetrics and Gynecology | Admitting: Obstetrics and Gynecology

## 2017-10-16 ENCOUNTER — Ambulatory Visit: Payer: Medicare Other

## 2017-10-16 DIAGNOSIS — R928 Other abnormal and inconclusive findings on diagnostic imaging of breast: Secondary | ICD-10-CM

## 2017-10-17 ENCOUNTER — Other Ambulatory Visit: Payer: Self-pay | Admitting: Student

## 2017-10-17 ENCOUNTER — Telehealth: Payer: Self-pay

## 2017-10-17 DIAGNOSIS — R110 Nausea: Secondary | ICD-10-CM

## 2017-10-17 DIAGNOSIS — R1033 Periumbilical pain: Secondary | ICD-10-CM

## 2017-10-17 NOTE — Telephone Encounter (Signed)
Can you call and schedule this patient with Dr. Aundra Dubin?   Copied from [place] [phone]. Topic: General - Other >> Oct 17, 2017  8:14 AM Carolyn Stare wrote:  Pt said she had a CT scan done  that was ordered by her GI doctor. She said the test was fine but there were some other things found and she would like to discuss. Pt sees Mable Paris and is asking if another female doctor could look at the CT and discuss what is showing on the Ct Pt would like a call back  [phone]

## 2017-10-17 NOTE — Telephone Encounter (Signed)
Pt is scheduled with Dr Aundra Dubin on 01/07. Thanks

## 2017-10-21 ENCOUNTER — Encounter: Payer: Self-pay | Admitting: Internal Medicine

## 2017-10-21 ENCOUNTER — Ambulatory Visit: Payer: Medicare Other | Admitting: Internal Medicine

## 2017-10-21 VITALS — BP 134/84 | HR 102 | Temp 97.6°F | Resp 16 | Ht 67.0 in | Wt 145.4 lb

## 2017-10-21 DIAGNOSIS — R319 Hematuria, unspecified: Secondary | ICD-10-CM

## 2017-10-21 DIAGNOSIS — R911 Solitary pulmonary nodule: Secondary | ICD-10-CM | POA: Diagnosis not present

## 2017-10-21 DIAGNOSIS — R918 Other nonspecific abnormal finding of lung field: Secondary | ICD-10-CM | POA: Diagnosis not present

## 2017-10-21 DIAGNOSIS — R634 Abnormal weight loss: Secondary | ICD-10-CM | POA: Diagnosis not present

## 2017-10-21 DIAGNOSIS — M8580 Other specified disorders of bone density and structure, unspecified site: Secondary | ICD-10-CM | POA: Diagnosis not present

## 2017-10-21 NOTE — Progress Notes (Signed)
Chief Complaint  Patient presents with  . Follow-up   F/u husband with her at visit today  1. Review CT ab/pelvis ordered by GI + lung nodule former smoker x 30 years 1 ppd quit 1998 brother died of lung cancer 2. Weight loss 20 lbs in 3 months     Review of Systems  Constitutional: Positive for weight loss.  HENT: Negative for hearing loss.        +tonsillar cyst   Respiratory: Negative for shortness of breath.   Cardiovascular: Negative for chest pain.  Gastrointestinal: Negative for abdominal pain.  Genitourinary: Positive for hematuria.  Skin: Negative for rash.  Psychiatric/Behavioral: Negative for memory loss.   Past Medical History:  Diagnosis Date  . Anemia   . Colonic polyp   . Constipation   . COPD (chronic obstructive pulmonary disease) ([place])   . Hyperlipidemia   . IBS (irritable bowel syndrome)   . Kidney cysts   . Lung nodule   . Nausea   . Palpitations   . Tonsillar cyst    Past Surgical History:  Procedure Laterality Date  . CARDIAC CATHETERIZATION  02/25/2007  . COLONOSCOPY    . ESOPHAGOGASTRODUODENOSCOPY  10/1997 & 08/2005  . FOOT SURGERY  04/25/2007   right  . THYROIDECTOMY  11/2005  . VAGINAL HYSTERECTOMY     ovaries intact   Family History  Problem Relation Age of Onset  . Cancer Mother        colon  . Emphysema Father   . Heart disease Father   . Cancer Father        leukemia  . Cancer Brother        lung  . Cancer Maternal Aunt        melanoma  . Cancer Maternal Grandfather        stomach, colon  . Breast cancer Neg Hx    Social History   Socioeconomic History  . Marital status: Married    Spouse name: Not on file  . Number of children: 2  . Years of education: Not on file  . Highest education level: Not on file  Social Needs  . Financial resource strain: Not on file  . Food insecurity - worry: Not on file  . Food insecurity - inability: Not on file  . Transportation needs - medical: Not on file  . Transportation needs -  non-medical: Not on file  Occupational History  . Occupation: Metallurgist: [employer]  Tobacco Use  . Smoking status: Former Smoker    Packs/day: 0.25    Years: 10.00    Pack years: 2.50    Types: Cigarettes    Last attempt to quit: 09/25/1997    Years since quitting: 20.0  . Smokeless tobacco: Never Used  Substance and Sexual Activity  . Alcohol use: No    Alcohol/week: 0.6 oz    Types: 1 Standard drinks or equivalent per week  . Drug use: No  . Sexual activity: Yes    Partners: Male    Birth control/protection: Surgical  Other Topics Concern  . Not on file  Social History Narrative   Lives with husband in [place], has 2 children and 2 step-children. No pets.      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       Diet - regular   Exercise - walks 5 days per week   1.5 years of college   No outpatient medications have  been marked as taking for the 10/21/17 encounter (Office Visit) with McLean-Scocuzza, Nino Glow, MD.   Allergies  Allergen Reactions  . Tdap [Diphth-Acell Pertussis-Tetanus] Swelling and Rash  . Codeine   . Diflucan [Fluconazole] Hives  . Esomeprazole Magnesium     REACTION: nausea  . Meperidine Hcl     REACTION: Causes heart to race  . Pneumovax 23 [Pneumococcal Vac Polyvalent]     Extensive local reaction with arm swelling and induration  . Propantheline Bromide   . Propoxyphene N-Acetaminophen     REACTION: heart race  . Sulfonamide Derivatives    Recent Results (from the past 2160 hour(s))  Comp Met (CMET)     Status: None   Collection Time: 09/10/17 10:09 AM  Result Value Ref Range   Sodium 140 135 - 145 mEq/L   Potassium 3.9 3.5 - 5.1 mEq/L   Chloride 104 96 - 112 mEq/L   CO2 30 19 - 32 mEq/L   Glucose, Bld 98 70 - 99 mg/dL   BUN 7 6 - 23 mg/dL   Creatinine, Ser 0.69 0.40 - 1.20 mg/dL   Total Bilirubin 0.6 0.2 - 1.2 mg/dL   Alkaline Phosphatase 96 39 - 117 U/L   AST 21 0 - 37 U/L   ALT 18 0 - 35 U/L   Total  Protein 7.2 6.0 - 8.3 g/dL   Albumin 4.2 3.5 - 5.2 g/dL   Calcium 9.5 8.4 - 10.5 mg/dL   GFR 90.05 >60.00 mL/min  CBC w/Diff     Status: None   Collection Time: 09/10/17 10:09 AM  Result Value Ref Range   WBC 4.6 4.0 - 10.5 K/uL   RBC 4.03 3.87 - 5.11 Mil/uL   Hemoglobin 13.2 12.0 - 15.0 g/dL   HCT 39.4 36.0 - 46.0 %   MCV 97.7 78.0 - 100.0 fl   MCHC 33.5 30.0 - 36.0 g/dL   RDW 12.7 11.5 - 15.5 %   Platelets 174.0 150.0 - 400.0 K/uL   Neutrophils Relative % 60.2 43.0 - 77.0 %   Lymphocytes Relative 29.8 12.0 - 46.0 %   Monocytes Relative 7.7 3.0 - 12.0 %   Eosinophils Relative 1.7 0.0 - 5.0 %   Basophils Relative 0.6 0.0 - 3.0 %   Neutro Abs 2.8 1.4 - 7.7 K/uL   Lymphs Abs 1.4 0.7 - 4.0 K/uL   Monocytes Absolute 0.4 0.1 - 1.0 K/uL   Eosinophils Absolute 0.1 0.0 - 0.7 K/uL   Basophils Absolute 0.0 0.0 - 0.1 K/uL  Lipase     Status: None   Collection Time: 09/10/17 10:09 AM  Result Value Ref Range   Lipase 19.0 11.0 - 59.0 U/L  POCT Urinalysis Dipstick     Status: Abnormal   Collection Time: 09/10/17 10:29 AM  Result Value Ref Range   Color, UA yellow    Clarity, UA clear    Glucose, UA neg    Bilirubin, UA neg    Ketones, UA neg    Spec Grav, UA <=1.005 (A) 1.010 - 1.025   Blood, UA trace-intact    pH, UA 6.0 5.0 - 8.0   Protein, UA neg    Urobilinogen, UA 0.2 0.2 or 1.0 E.U./dL   Nitrite, UA neg    Leukocytes, UA Negative Negative  Urine Culture     Status: None   Collection Time: 09/10/17  1:13 PM  Result Value Ref Range   MICRO NUMBER: [id number]    SPECIMEN QUALITY: ADEQUATE    Sample  Source NOT GIVEN    STATUS: FINAL    Result:      Single organism less than 10,000 CFU/mL isolated. These organisms, commonly found on external and internal genitalia, are considered colonizers. No further testing performed.  Urine Microscopic Only     Status: None   Collection Time: 09/10/17  1:13 PM  Result Value Ref Range   WBC, UA none seen 0-2/hpf   RBC / HPF none seen  0-2/hpf   Squamous Epithelial / LPF Rare(0-4/hpf) Rare(0-4/hpf)   Objective  Body mass index is 22.77 kg/m. Wt Readings from Last 3 Encounters:  10/21/17 145 lb 6 oz (65.9 kg)  09/10/17 147 lb 12.8 oz (67 kg)  07/08/17 148 lb 3.2 oz (67.2 kg)   Temp Readings from Last 3 Encounters:  10/21/17 97.6 F (36.4 C) (Oral)  09/10/17 97.8 F (36.6 C) (Oral)  07/08/17 98.2 F (36.8 C) (Oral)   BP Readings from Last 3 Encounters:  10/21/17 134/84  09/10/17 124/82  07/08/17 118/84   Pulse Readings from Last 3 Encounters:  10/21/17 (!) 102  09/10/17 96  07/08/17 88    Physical Exam  Constitutional: She is oriented to person, place, and time and well-developed, well-nourished, and in no distress.  HENT:  Head: Normocephalic and atraumatic.  Mouth/Throat: Oropharynx is clear and moist and mucous membranes are normal.    Eyes: Conjunctivae are normal. Pupils are equal, round, and reactive to light.  Neck:  Right tonsillar cyst   Cardiovascular: Regular rhythm and normal heart sounds. Tachycardia present.  No murmur heard. Pulmonary/Chest: Effort normal and breath sounds normal. She has no wheezes.  Neurological: She is alert and oriented to person, place, and time. Gait normal. Gait normal.  Skin: Skin is warm and dry.  Psychiatric: Mood, memory, affect and judgment normal.  Nursing note and vitals reviewed.   Assessment   1.lung nodule  2.weight loss  3. Osteopenia noted DEXA 08/15/12  4. HM 5. Hematuria   Plan  1. Will do CT chest w/o contrast  2. W/u with CT chest w/o contrast. Had CT ab/pelvis. If CT chest neg will w/u kidney cysts and needs pap last 09/08/12  04/12/14 tubular/hyperplastic polpys mammo neg 10/16/17  F/u in 3 months  3. Re order DEXA  rec Ca 600 mg bid on vit D  4. Had flu shot 06/2017  Disc shingrix today  See other HM above   5. Check UA consider w/u complex cyst vs referral urology if hematuria consistent.  Provider: Dr. Olivia Mackie  [facility]

## 2017-10-21 NOTE — Patient Instructions (Addendum)
Please follow up in 3 weeks  Take Calcium 600 mg 2x per day  We will order DEXA and CT chest  Take care   Pulmonary Nodule A pulmonary nodule is a small, round growth of tissue in the lung. Pulmonary nodules can range in size from less than 1/5 inch (4 mm) to a little bigger than an inch (25 mm). Most pulmonary nodules are detected when imaging tests of the lung are being performed for a different problem. Pulmonary nodules are usually not cancerous (benign). However, some pulmonary nodules are cancerous (malignant). Follow-up treatment or testing is based on the size of the pulmonary nodule and your risk of getting lung cancer. What are the causes? Benign pulmonary nodules can be caused by various things. Some of the causes include:  Bacterial, fungal, or viral infections. This is usually an old infection that is no longer active, but it can sometimes be a current, active infection.  A benign mass of tissue.  Inflammation from conditions such as rheumatoid arthritis.  Abnormal blood vessels in the lungs.  Malignant pulmonary nodules can result from lung cancer or from cancers that spread to the lung from other places in the body. What are the signs or symptoms? Pulmonary nodules usually do not cause symptoms. How is this diagnosed? Most often, pulmonary nodules are found incidentally when an X-ray or CT scan is performed to look for some other problem in the lung area. To help determine whether a pulmonary nodule is benign or malignant, your health care provider will take a medical history and order a variety of tests. Tests done may include:  Blood tests.  A skin test called a tuberculin test. This test is used to determine if you have been exposed to the germ that causes tuberculosis.  Chest X-rays. If possible, a new X-ray may be compared with X-rays you have had in the past.  CT scan. This test shows smaller pulmonary nodules more clearly than an X-ray.  Positron emission  tomography (PET) scan. In this test, a safe amount of a radioactive substance is injected into the bloodstream. Then, the scan takes a picture of the pulmonary nodule. The radioactive substance is eliminated from your body in your urine.  Biopsy. A tiny piece of the pulmonary nodule is removed so it can be checked under a microscope.  How is this treated? Pulmonary nodules that are benign normally do not require any treatment because they usually do not cause symptoms or breathing problems. Your health care provider may want to monitor the pulmonary nodule through follow-up CT scans. The frequency of these CT scans will vary based on the size of the nodule and the risk factors for lung cancer. For example, CT scans will need to be done more frequently if the pulmonary nodule is larger and if you have a history of smoking and a family history of cancer. Further testing or biopsies may be done if any follow-up CT scan shows that the size of the pulmonary nodule has increased. Follow these instructions at home:  Only take over-the-counter or prescription medicines as directed by your health care provider.  Keep all follow-up appointments with your health care provider. Contact a health care provider if:  You have trouble breathing when you are active.  You feel sick or unusually tired.  You do not feel like eating.  You lose weight without trying to.  You develop chills or night sweats. Get help right away if:  You cannot catch your breath, or you  begin wheezing.  You cannot stop coughing.  You cough up blood.  You become dizzy or feel like you are going to pass out.  You have sudden chest pain.  You have a fever or persistent symptoms for more than 2-3 days.  You have a fever and your symptoms suddenly get worse. This information is not intended to replace advice given to you by your health care provider. Make sure you discuss any questions you have with your health care  provider. Document Released: 07/29/2009 Document Revised: 03/08/2016 Document Reviewed: 03/23/2013 Elsevier Interactive Patient Education  2017 [employer] Inc[employer]  Hematuria, Adult Hematuria is blood in your urine. It can be caused by a bladder infection, kidney infection, prostate infection, kidney stone, or cancer of your urinary tract. Infections can usually be treated with medicine, and a kidney stone usually will pass through your urine. If neither of these is the cause of your hematuria, further workup to find out the reason may be needed. It is very important that you tell your health care provider about any blood you see in your urine, even if the blood stops without treatment or happens without causing pain. Blood in your urine that happens and then stops and then happens again can be a symptom of a very serious condition. Also, pain is not a symptom in the initial stages of many urinary cancers. Follow these instructions at home:  Drink lots of fluid, 3-4 quarts a day. If you have been diagnosed with an infection, cranberry juice is especially recommended, in addition to large amounts of water.  Avoid caffeine, tea, and carbonated beverages because they tend to irritate the bladder.  Avoid alcohol because it may irritate the prostate.  Take all medicines as directed by your health care provider.  If you were prescribed an antibiotic medicine, finish it all even if you start to feel better.  If you have been diagnosed with a kidney stone, follow your health care provider's instructions regarding straining your urine to catch the stone.  Empty your bladder often. Avoid holding urine for long periods of time.  After a bowel movement, women should cleanse front to back. Use each tissue only once.  Empty your bladder before and after sexual intercourse if you are a female. Contact a health care provider if:  You develop back pain.  You have a fever.  You have a feeling of sickness in  your stomach (nausea) or vomiting.  Your symptoms are not better in 3 days. Return sooner if you are getting worse. Get help right away if:  You develop severe vomiting and are unable to keep the medicine down.  You develop severe back or abdominal pain despite taking your medicines.  You begin passing a large amount of blood or clots in your urine.  You feel extremely weak or faint, or you pass out. This information is not intended to replace advice given to you by your health care provider. Make sure you discuss any questions you have with your health care provider. Document Released: 10/01/2005 Document Revised: 03/08/2016 Document Reviewed: 06/01/2013 Elsevier Interactive Patient Education  2017 [employer].

## 2017-10-22 ENCOUNTER — Other Ambulatory Visit (INDEPENDENT_AMBULATORY_CARE_PROVIDER_SITE_OTHER): Payer: Medicare Other

## 2017-10-22 DIAGNOSIS — R319 Hematuria, unspecified: Secondary | ICD-10-CM

## 2017-10-22 LAB — URINALYSIS, ROUTINE W REFLEX MICROSCOPIC
Bilirubin Urine: NEGATIVE
Hgb urine dipstick: NEGATIVE
Ketones, ur: NEGATIVE
Leukocytes, UA: NEGATIVE
Nitrite: NEGATIVE
RBC / HPF: NONE SEEN (ref 0–?)
Specific Gravity, Urine: 1.01 (ref 1.000–1.030)
Total Protein, Urine: NEGATIVE
Urine Glucose: NEGATIVE
Urobilinogen, UA: 0.2 (ref 0.0–1.0)
pH: 6 (ref 5.0–8.0)

## 2017-10-28 ENCOUNTER — Telehealth: Payer: Self-pay

## 2017-10-28 NOTE — Telephone Encounter (Signed)
Patient has been notified.  Also patient has Follow Up CT later this week from Dr. Aundra Dubin.  Patient has been following up CT with Dr. Olivia Mackie.

## 2017-10-31 ENCOUNTER — Ambulatory Visit
Admission: RE | Admit: 2017-10-31 | Discharge: 2017-10-31 | Disposition: A | Discharge status: Home / Self Care | Payer: Medicare Other | Source: Ambulatory Visit | Attending: Internal Medicine | Admitting: Internal Medicine

## 2017-10-31 DIAGNOSIS — J984 Other disorders of lung: Secondary | ICD-10-CM | POA: Diagnosis not present

## 2017-10-31 DIAGNOSIS — R918 Other nonspecific abnormal finding of lung field: Secondary | ICD-10-CM | POA: Insufficient documentation

## 2017-10-31 DIAGNOSIS — I700 Atherosclerosis of aorta: Secondary | ICD-10-CM | POA: Insufficient documentation

## 2017-10-31 DIAGNOSIS — D7389 Other diseases of spleen: Secondary | ICD-10-CM | POA: Insufficient documentation

## 2017-11-02 ENCOUNTER — Ambulatory Visit
Admission: RE | Admit: 2017-11-02 | Discharge: 2017-11-02 | Disposition: A | Discharge status: Home / Self Care | Payer: Medicare Other | Source: Ambulatory Visit | Attending: Student | Admitting: Student

## 2017-11-02 DIAGNOSIS — R1033 Periumbilical pain: Secondary | ICD-10-CM | POA: Insufficient documentation

## 2017-11-02 DIAGNOSIS — R110 Nausea: Secondary | ICD-10-CM | POA: Diagnosis present

## 2017-11-02 MED ORDER — TECHNETIUM TC 99M SULFUR COLLOID
2.1300 | Freq: Once | INTRAVENOUS | Status: AC | PRN
  Administered 2017-11-02: 2.13 via INTRAVENOUS

## 2017-11-04 ENCOUNTER — Other Ambulatory Visit: Payer: Self-pay | Admitting: Internal Medicine

## 2017-11-04 ENCOUNTER — Telehealth: Payer: Self-pay | Admitting: Cardiovascular Disease

## 2017-11-04 DIAGNOSIS — R9389 Abnormal findings on diagnostic imaging of other specified body structures: Secondary | ICD-10-CM

## 2017-11-04 NOTE — Telephone Encounter (Signed)
Patient reports that she recently had CT scan which showed some areas of concern. She quoted report of the cardiovascular findings and wanted to see if she should need any additional testing. Advised that I would make Dr. Rockey Situ aware and he could review images and findings then I would call her back with any recommendations. She verbalized understanding and had no further questions at this time.   Results were as follows: Cardiovascular: There is atherosclerotic calcification of the coronary arteries. Heart size is normal. There is atherosclerotic calcification of the thoracic aorta not associated with aneurysm. The noncontrast appearance of the pulmonary arteries is normal.

## 2017-11-04 NOTE — Telephone Encounter (Signed)
Previous smoking hx caused some plaque build up Cholesterol currently at goal to prevent progression We can go over images on her next visit, she can remind me to pull them up

## 2017-11-04 NOTE — Telephone Encounter (Signed)
Spoke with patient and she wanted to know if recent findings on CT scan recommend the need for any further testing. Advised that I would give her a call back after reviewing results.

## 2017-11-04 NOTE — Telephone Encounter (Signed)
Patient would like to talk about results of CT Scan of chest that had cardio related results   Please call

## 2017-11-04 NOTE — Telephone Encounter (Signed)
Left voicemail message to call back  

## 2017-11-05 ENCOUNTER — Encounter: Payer: Self-pay | Admitting: Pulmonary Disease

## 2017-11-05 ENCOUNTER — Ambulatory Visit: Payer: Medicare Other | Admitting: Pulmonary Disease

## 2017-11-05 VITALS — BP 128/78 | HR 105 | Ht 67.0 in | Wt 146.0 lb

## 2017-11-05 DIAGNOSIS — R911 Solitary pulmonary nodule: Secondary | ICD-10-CM | POA: Diagnosis not present

## 2017-11-05 DIAGNOSIS — R9389 Abnormal findings on diagnostic imaging of other specified body structures: Secondary | ICD-10-CM

## 2017-11-05 NOTE — Progress Notes (Signed)
PULMONARY CONSULT NOTE  Requesting MD/Service: McLean-Scocuzza Date of initial consultation: 11/05/17 Reason for consultation: Abnormal CT chest  PT PROFILE: 67 y.o. female former smoker (light and intermittent) initially evaluated for abdominal complaints with CT abdomen which revealed RML nodule.  CT chest performed with findings as below.  Referred for evaluation of abnormal CT chest.  No respiratory or pulmonary symptoms  DATA: CT chest 10/31/17: Reviewed by me.  Mild progression of biapical pleuroparenchymal scarring. Minimal reticulonodular change within the right middle lobe, for which inflammatory/infectious process should be considered. Atypical infection should also be considered, particularly mycobacterium avium intracellulare. No suspicious pulmonary nodules. Calcified granulomata within the spleen  HPI:  As above.  She initially developed abdominal symptoms which led to the CT scan of her abdomen.  This was followed by CT of the chest with the findings as documented above.  She denies exertional dyspnea, chest pain, significant cough or sputum production, hemoptysis, fever, lower extremity edema, calf tenderness.  With the abdominal problems recently, she has had a 20 pound weight loss but her symptoms are improved and her weight has stabilized.  Past Medical History:  Diagnosis Date  . Anemia   . Colonic polyp   . Constipation   . COPD (chronic obstructive pulmonary disease) ([place])   . Hyperlipidemia   . IBS (irritable bowel syndrome)   . Kidney cysts   . Lung nodule   . Nausea   . Palpitations   . Tonsillar cyst     Past Surgical History:  Procedure Laterality Date  . CARDIAC CATHETERIZATION  02/25/2007  . COLONOSCOPY    . ESOPHAGOGASTRODUODENOSCOPY  10/1997 & 08/2005  . FOOT SURGERY  04/25/2007   right  . THYROIDECTOMY  11/2005  . VAGINAL HYSTERECTOMY     ovaries intact    MEDICATIONS: I have reviewed all medications and confirmed regimen as  documented  Social History   Socioeconomic History  . Marital status: Married    Spouse name: Not on file  . Number of children: 2  . Years of education: Not on file  . Highest education level: Not on file  Social Needs  . Financial resource strain: Not on file  . Food insecurity - worry: Not on file  . Food insecurity - inability: Not on file  . Transportation needs - medical: Not on file  . Transportation needs - non-medical: Not on file  Occupational History  . Occupation: Metallurgist: [employer]  Tobacco Use  . Smoking status: Former Smoker    Packs/day: 0.25    Years: 10.00    Pack years: 2.50    Types: Cigarettes    Last attempt to quit: 09/25/1997    Years since quitting: 20.1  . Smokeless tobacco: Never Used  Substance and Sexual Activity  . Alcohol use: No    Alcohol/week: 0.6 oz    Types: 1 Standard drinks or equivalent per week  . Drug use: No  . Sexual activity: Yes    Partners: Male    Birth control/protection: Surgical  Other Topics Concern  . Not on file  Social History Narrative   Lives with husband in [place], has 2 children and 2 step-children. No pets.      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       Diet - regular   Exercise - walks 5 days per week   1.5 years of college    Family History  Problem Relation Age of  Onset  . Cancer Mother        colon  . Emphysema Father   . Heart disease Father   . Cancer Father        leukemia  . Cancer Brother        lung  . Cancer Maternal Aunt        melanoma  . Cancer Maternal Grandfather        stomach, colon  . Breast cancer Neg Hx     ROS: No fever, myalgias/arthralgias, unexplained weight loss or weight gain No new focal weakness or sensory deficits No otalgia, hearing loss, visual changes, nasal and sinus symptoms, mouth and throat problems No neck pain or adenopathy No abdominal pain, N/V/D, diarrhea, change in bowel pattern No dysuria, change in  urinary pattern   Vitals:   11/05/17 0958 11/05/17 0959  BP:  128/78  Pulse:  (!) 105  SpO2:  99%  Weight: 66.2 kg (146 lb)   Height: 5' 7"  (1.702 m)      EXAM:   Gen: WDWN in NAD HEENT: NCAT, sclerae white, oropharynx normal Neck: No LAN, no JVD noted Lungs: full BS, normal percussion note throughout, no adventitious sounds Cardiovascular: Regular, normal rate, no M noted Abdomen: Soft, NT, +BS Ext: Trace symmetric pretibial and ankle edema Neuro: PERRL, EOMI, motor/sensory grossly intact Skin: No lesions noted   DATA:   BMP Latest Ref Rng & Units 09/10/2017 07/15/2017 05/08/2016  Glucose 70 - 99 mg/dL 98 91 88  BUN 6 - 23 mg/dL 7 7(L) 7(L)  Creatinine 0.40 - 1.20 mg/dL 0.69 0.74 0.77  BUN/Creat Ratio 12 - 28 - 9(L) 9(L)  Sodium 135 - 145 mEq/L 140 143 138  Potassium 3.5 - 5.1 mEq/L 3.9 4.1 4.2  Chloride 96 - 112 mEq/L 104 105 99  CO2 19 - 32 mEq/L 30 27 23   Calcium 8.4 - 10.5 mg/dL 9.5 9.3 9.1    CBC Latest Ref Rng & Units 09/10/2017 07/15/2017 05/08/2016  WBC 4.0 - 10.5 K/uL 4.6 4.2 4.4  Hemoglobin 12.0 - 15.0 g/dL 13.2 13.3 13.2  Hematocrit 36.0 - 46.0 % 39.4 39.9 38.2  Platelets 150.0 - 400.0 K/uL 174.0 186 186    CXR: No recent chest x-ray  IMPRESSION:   Abnormal CT chest - she has 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  In addition, she has inflammatory appearing reticulonodular changes in the RML.  These changes do not appear to be malignant.  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  PLAN:  We discussed the findings on CT scan of the chest in detail and reviewed them in the office together.  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  There is an association between RML syndrome and atypical mycobacterial infections.  It is very  possible that she has pulmonary MAC or some other nontuberculous mycobacterium.  If so, these infections are very difficult to treat and often, treatment is worse than the condition.  We will obtain a repeat CT scan of the chest in 6 months with follow-up after that.  Further diagnostic testing could include bronchoscopy for RML BAL.  She knows to contact our office sooner if she develops any respiratory or pulmonary symptoms.   Merton Border, MD PCCM service Mobile [phone] Pager [phone] 11/05/2017 10:23 AM

## 2017-11-05 NOTE — Telephone Encounter (Signed)
Reviewed Dr. Donivan Scull note and recommendation to review images at next OV with patient. She verbalized understanding and is agreeable w/plan.

## 2017-11-05 NOTE — Patient Instructions (Signed)
Follow-up in 6 months with repeat CT chest prior to that

## 2017-11-08 ENCOUNTER — Encounter: Payer: Self-pay | Admitting: *Deleted

## 2017-11-11 ENCOUNTER — Encounter: Payer: Self-pay | Admitting: Internal Medicine

## 2017-11-11 ENCOUNTER — Ambulatory Visit: Payer: Medicare Other | Admitting: Internal Medicine

## 2017-11-11 VITALS — BP 116/84 | HR 90 | Temp 98.1°F | Resp 16 | Ht 67.0 in | Wt 147.0 lb

## 2017-11-11 DIAGNOSIS — M8580 Other specified disorders of bone density and structure, unspecified site: Secondary | ICD-10-CM

## 2017-11-11 DIAGNOSIS — D329 Benign neoplasm of meninges, unspecified: Secondary | ICD-10-CM | POA: Diagnosis not present

## 2017-11-11 DIAGNOSIS — N281 Cyst of kidney, acquired: Secondary | ICD-10-CM | POA: Diagnosis not present

## 2017-11-11 DIAGNOSIS — R319 Hematuria, unspecified: Secondary | ICD-10-CM | POA: Diagnosis not present

## 2017-11-11 DIAGNOSIS — R634 Abnormal weight loss: Secondary | ICD-10-CM | POA: Diagnosis not present

## 2017-11-11 DIAGNOSIS — K824 Cholesterolosis of gallbladder: Secondary | ICD-10-CM

## 2017-11-11 DIAGNOSIS — Z86010 Personal history of colonic polyps: Secondary | ICD-10-CM

## 2017-11-11 DIAGNOSIS — E890 Postprocedural hypothyroidism: Secondary | ICD-10-CM | POA: Diagnosis not present

## 2017-11-11 NOTE — Patient Instructions (Addendum)
I referred you to urology  Follow up in 2-3 months  Take care    Meningioma Meningioma is a tumor that occurs in the thin tissue that covers the brain and spinal cord (meninges). Meningiomas are usually not cancerous (benign) and do not spread to other areas. In rare cases, a meningioma may become cancerous (malignant). What are the causes? In many cases, the cause of this condition is not known. In some cases, meningioma may be caused by:  Having a genetic disorder that causes multiple soft tumors (neurofibromatosis 2).  A change in certain genes (genetic mutation).  What increases the risk? You are more likely to develop this condition if:  You have been exposed to radiation.  You are an older woman. Older women have a higher risk of meningiomas than men or children. However, men have a higher risk of malignant meningiomas.  You have injured your head in the past.  You have a history of breast cancer.  What are the signs or symptoms? Symptoms of this condition usually begin very slowly. The symptoms may depend on the size and location of the tumor. Possible symptoms include:  Headaches.  Nausea and vomiting.  Vision changes.  Hearing changes.  Loss of the sense of smell.  Fits of uncontrolled movements (seizures).  Weakness or numbness on one side of the body or in an arm or leg.  Mood or personality changes.  Problems with memory or thinking.  How is this diagnosed? This condition is diagnosed based on:  Results of brain imaging tests, such as a CT scan or MRI.  Removal and testing of a sample of the tumor (biopsy). This may be done to confirm the diagnosis and to help determine the best treatment for the condition.  How is this treated? You may not have treatment until your symptoms start to affect your daily activities. This is because meningioma grows so slowly, and your health care provider may prefer to monitor its growth before starting treatment. If you  do need treatment, it may include:  Medicines to decrease brain swelling and improve symptoms (steroids).  High-energy rays (radiation therapy) to shrink or kill the tumor.  Anti-cancer medicines (chemotherapy) to shrink or kill the tumor. Chemotherapy has many side effects because it also kills healthy cells.  Targeted therapy. This kills cancerous cells without affecting normal cells.  Surgery to remove as much of the tumor as possible.  Follow these instructions at home:  Take over-the-counter and prescription medicines only as told by your health care provider.  Keep all follow-up visits as told by your health care provider. This is important. You may need regular visits to monitor the growth of your tumor. Contact a health care provider if:  You have symptoms that come back.  You have diarrhea.  You vomit.  You have abdominal pain.  You cannot eat or drink as much as you need.  You are weaker or more tired than usual.  You are losing weight without trying. Get help right away if:  Your diarrhea, vomiting, or abdominal pain does not go away.  You have new symptoms, such as vision problems or difficulty walking.  You have a seizure.  You have bleeding that does not stop.  You have trouble breathing.  You have a fever. Summary  Meningioma is a tumor that occurs in the thin tissue that covers the brain and spinal cord (meninges).  Meningiomas are usually benign, which means they are not cancerous and do not spread to  other areas.  Symptoms of this condition usually begin very slowly. The symptoms may depend on the size and location of the tumor.  Your tumor may be monitored over time. You may not need treatment until your tumor starts to affect your daily life. This information is not intended to replace advice given to you by your health care provider. Make sure you discuss any questions you have with your health care provider. Document Released: 10/06/2013  Document Revised: 10/05/2016 Document Reviewed: 10/05/2016 Elsevier Interactive Patient Education  2017 [employer].

## 2017-11-13 ENCOUNTER — Encounter: Payer: Self-pay | Admitting: Internal Medicine

## 2017-11-13 ENCOUNTER — Telehealth: Payer: Self-pay | Admitting: Cardiovascular Disease

## 2017-11-13 DIAGNOSIS — R911 Solitary pulmonary nodule: Secondary | ICD-10-CM

## 2017-11-13 NOTE — Telephone Encounter (Signed)
° °  [facility] Pre-operative Risk Assessment    Request for surgical clearance:  1. What type of surgery is being performed? Upper Endoscopy   2. When is this surgery scheduled? 12/17/17  3. What type of clearance is required (medical clearance vs. Pharmacy clearance to hold med vs. Both)? Both   4. Are there any medications that need to be held prior to surgery and how long? Not listed   5. Practice name and name of physician performing surgery? [facility]  6. What is your office phone and fax number? Phone [phone] Fax [phone]  7. Anesthesia type (None, local, MAC, general) ?    Placed in nurses bin

## 2017-11-13 NOTE — Progress Notes (Signed)
Chief Complaint  Patient presents with  . Follow-up   F/u wt loss review of chart wt is stable. She reports h/o wt loss though wt currently stable and she is eating well.  Reviewed CT chest which is concerning for no lung nodules, MAI lungs, aortic atherosclerosis and calcified granulomas in spleen. Pulmonary will f/u in 6 months and repeat CT chest no tx for now that though could be infection vs lung fibrosis vs inflammation.   Hematuria repeat UA negative blood 10/22/17 CT ab pelvis 09/2017 b/l kidney cysts and complex cyst in left kidney 0.9 x 0.7 cm will refer to urology to further w/u with h/o hematuria  She has h/o left tentorial meningioma and follows with Dr. Tomi Likens Neurology in [place]. She prefers open MRI. appt 02/2018 with Dr. Tomi Likens.     Review of Systems  Constitutional: Negative for weight loss.       Wt stable compared to prior   HENT: Negative for hearing loss.   Eyes:       No vision changes   Respiratory: Negative for shortness of breath.   Cardiovascular: Negative for chest pain.  Gastrointestinal: Negative for abdominal pain.  Genitourinary: Positive for hematuria.  Musculoskeletal: Negative for falls.  Skin: Negative for rash.  Neurological:       +h/o meningioma   Psychiatric/Behavioral: Negative for memory loss.   Past Medical History:  Diagnosis Date  . Anemia   . Colonic polyp   . Constipation   . COPD (chronic obstructive pulmonary disease) ([place])   . Hyperlipidemia   . IBS (irritable bowel syndrome)   . Kidney cysts   . Lung nodule   . Meningioma ([place])   . Nausea   . Palpitations   . Tonsillar cyst    Past Surgical History:  Procedure Laterality Date  . CARDIAC CATHETERIZATION  02/25/2007  . COLONOSCOPY    . ESOPHAGOGASTRODUODENOSCOPY  10/1997 & 08/2005  . FOOT SURGERY  04/25/2007   right  . THYROIDECTOMY  11/2005  . VAGINAL HYSTERECTOMY     ovaries intact   Family History  Problem Relation Age of Onset  . Cancer Mother        colon  .  Emphysema Father   . Heart disease Father   . Cancer Father        leukemia  . Cancer Brother        lung  . Cancer Maternal Aunt        melanoma  . Cancer Maternal Grandfather        stomach, colon  . Breast cancer Neg Hx    Social History   Socioeconomic History  . Marital status: Married    Spouse name: Not on file  . Number of children: 2  . Years of education: Not on file  . Highest education level: Not on file  Social Needs  . Financial resource strain: Not on file  . Food insecurity - worry: Not on file  . Food insecurity - inability: Not on file  . Transportation needs - medical: Not on file  . Transportation needs - non-medical: Not on file  Occupational History  . Occupation: Metallurgist: [employer]  Tobacco Use  . Smoking status: Former Smoker    Packs/day: 0.25    Years: 10.00    Pack years: 2.50    Types: Cigarettes    Last attempt to quit: 09/25/1997    Years since quitting: 20.1  . Smokeless tobacco: Never Used  Substance and Sexual Activity  . Alcohol use: No    Alcohol/week: 0.6 oz    Types: 1 Standard drinks or equivalent per week  . Drug use: No  . Sexual activity: Yes    Partners: Male    Birth control/protection: Surgical  Other Topics Concern  . Not on file  Social History Narrative   Lives with husband in [place], has 2 children and 2 step-children. No pets.      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       Diet - regular   Exercise - walks 5 days per week   1.5 years of college   Current Meds  Medication Sig  . acyclovir ointment (ZOVIRAX) 5 % Apply 1 application topically every 3 (three) hours.   Marland Kitchen BYSTOLIC 5 MG tablet TAKE 1 TABLET BY MOUTH EVERY DAY.  . ergocalciferol (VITAMIN D2) 50000 UNITS capsule Take 50,000 Units by mouth every 30 (thirty) days.  Marland Kitchen levothyroxine (SYNTHROID, LEVOTHROID) 88 MCG tablet Take 1 tablet 6 days a week.  . Multiple Vitamin (MULTIVITAMIN PO) Take 1 tablet by mouth  daily.    . ranitidine (ZANTAC) 150 MG tablet Take 150 mg by mouth as needed.    . simvastatin (ZOCOR) 20 MG tablet TAKE 1 TABLET EVERY EVENING (IMPROVES CHOLESTEROL)   Allergies  Allergen Reactions  . Tdap [Diphth-Acell Pertussis-Tetanus] Swelling and Rash  . Codeine   . Diflucan [Fluconazole] Hives  . Esomeprazole Magnesium     REACTION: nausea  . Meperidine Hcl     REACTION: Causes heart to race  . Pneumovax 23 [Pneumococcal Vac Polyvalent]     Extensive local reaction with arm swelling and induration  . Propantheline Bromide   . Propoxyphene N-Acetaminophen     REACTION: heart race  . Sulfonamide Derivatives    Recent Results (from the past 2160 hour(s))  Comp Met (CMET)     Status: None   Collection Time: 09/10/17 10:09 AM  Result Value Ref Range   Sodium 140 135 - 145 mEq/L   Potassium 3.9 3.5 - 5.1 mEq/L   Chloride 104 96 - 112 mEq/L   CO2 30 19 - 32 mEq/L   Glucose, Bld 98 70 - 99 mg/dL   BUN 7 6 - 23 mg/dL   Creatinine, Ser 0.69 0.40 - 1.20 mg/dL   Total Bilirubin 0.6 0.2 - 1.2 mg/dL   Alkaline Phosphatase 96 39 - 117 U/L   AST 21 0 - 37 U/L   ALT 18 0 - 35 U/L   Total Protein 7.2 6.0 - 8.3 g/dL   Albumin 4.2 3.5 - 5.2 g/dL   Calcium 9.5 8.4 - 10.5 mg/dL   GFR 90.05 >60.00 mL/min  CBC w/Diff     Status: None   Collection Time: 09/10/17 10:09 AM  Result Value Ref Range   WBC 4.6 4.0 - 10.5 K/uL   RBC 4.03 3.87 - 5.11 Mil/uL   Hemoglobin 13.2 12.0 - 15.0 g/dL   HCT 39.4 36.0 - 46.0 %   MCV 97.7 78.0 - 100.0 fl   MCHC 33.5 30.0 - 36.0 g/dL   RDW 12.7 11.5 - 15.5 %   Platelets 174.0 150.0 - 400.0 K/uL   Neutrophils Relative % 60.2 43.0 - 77.0 %   Lymphocytes Relative 29.8 12.0 - 46.0 %   Monocytes Relative 7.7 3.0 - 12.0 %   Eosinophils Relative 1.7 0.0 - 5.0 %   Basophils Relative 0.6 0.0 - 3.0 %  Neutro Abs 2.8 1.4 - 7.7 K/uL   Lymphs Abs 1.4 0.7 - 4.0 K/uL   Monocytes Absolute 0.4 0.1 - 1.0 K/uL   Eosinophils Absolute 0.1 0.0 - 0.7 K/uL   Basophils  Absolute 0.0 0.0 - 0.1 K/uL  Lipase     Status: None   Collection Time: 09/10/17 10:09 AM  Result Value Ref Range   Lipase 19.0 11.0 - 59.0 U/L  POCT Urinalysis Dipstick     Status: Abnormal   Collection Time: 09/10/17 10:29 AM  Result Value Ref Range   Color, UA yellow    Clarity, UA clear    Glucose, UA neg    Bilirubin, UA neg    Ketones, UA neg    Spec Grav, UA <=1.005 (A) 1.010 - 1.025   Blood, UA trace-intact    pH, UA 6.0 5.0 - 8.0   Protein, UA neg    Urobilinogen, UA 0.2 0.2 or 1.0 E.U./dL   Nitrite, UA neg    Leukocytes, UA Negative Negative  Urine Culture     Status: None   Collection Time: 09/10/17  1:13 PM  Result Value Ref Range   MICRO NUMBER: [id number]    SPECIMEN QUALITY: ADEQUATE    Sample Source NOT GIVEN    STATUS: FINAL    Result:      Single organism less than 10,000 CFU/mL isolated. These organisms, commonly found on external and internal genitalia, are considered colonizers. No further testing performed.  Urine Microscopic Only     Status: None   Collection Time: 09/10/17  1:13 PM  Result Value Ref Range   WBC, UA none seen 0-2/hpf   RBC / HPF none seen 0-2/hpf   Squamous Epithelial / LPF Rare(0-4/hpf) Rare(0-4/hpf)  Urinalysis, Routine w reflex microscopic     Status: None   Collection Time: 10/22/17  1:11 PM  Result Value Ref Range   Color, Urine YELLOW Yellow;Lt. Yellow   APPearance CLEAR Clear   Specific Gravity, Urine 1.010 1.000 - 1.030   pH 6.0 5.0 - 8.0   Total Protein, Urine NEGATIVE Negative   Urine Glucose NEGATIVE Negative   Ketones, ur NEGATIVE Negative   Bilirubin Urine NEGATIVE Negative   Hgb urine dipstick NEGATIVE Negative   Urobilinogen, UA 0.2 0.0 - 1.0   Leukocytes, UA NEGATIVE Negative   Nitrite NEGATIVE Negative   WBC, UA 0-2/hpf 0-2/hpf   RBC / HPF none seen 0-2/hpf   Squamous Epithelial / LPF Rare(0-4/hpf) Rare(0-4/hpf)   Objective  Body mass index is 23.02 kg/m. Wt Readings from Last 3 Encounters:  11/11/17 147  lb (66.7 kg)  11/05/17 146 lb (66.2 kg)  10/21/17 145 lb 6 oz (65.9 kg)   Temp Readings from Last 3 Encounters:  11/11/17 98.1 F (36.7 C) (Oral)  10/21/17 97.6 F (36.4 C) (Oral)  09/10/17 97.8 F (36.6 C) (Oral)   BP Readings from Last 3 Encounters:  11/11/17 116/84  11/05/17 128/78  10/21/17 134/84   Pulse Readings from Last 3 Encounters:  11/11/17 90  11/05/17 (!) 105  10/21/17 (!) 102   O2 sat room air 99%  Physical Exam  Constitutional: She is oriented to person, place, and time and well-developed, well-nourished, and in no distress. Vital signs are normal.  HENT:  Head: Normocephalic and atraumatic.  Mouth/Throat: Oropharynx is clear and moist and mucous membranes are normal.  Eyes: Conjunctivae are normal. Pupils are equal, round, and reactive to light.  Cardiovascular: Normal rate, regular rhythm and normal heart sounds.  Pulmonary/Chest: Effort normal and breath sounds normal.  Neurological: She is alert and oriented to person, place, and time. Gait normal. Gait normal.  Skin: Skin is warm and dry.  Psychiatric: Mood, memory, affect and judgment normal.  Nursing note and vitals reviewed.   Assessment   1. Weight loss wt stable since last visit.  reviewed CT chest/ab/pelvis lung atypical infection vs inflammatory process vs inflammation and renal cysts 1 is complex in left kidney  2.h/o meningioma  3. H/o hematuria with complex cyst in left kidney 4. HM Plan  1.   She had normal gastric emptying study and pending EGD with [facility]. They do not think colonoscopy indicated at this time. If EGD neg consider SIBO testing but low sus. with no diarrhea. Of note reviewed [place] 07/2512 abdomen c/w small GB polyp Other considerations wt loss celiac, MM though no renal impairment or hypercalcemia, consider check cortisol am in future, hep c neg 2016, also in ddx TB though CT chest sus. MAI not TB, rheumatologic conditions I.e RA.  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  appt Dr. Tomi Likens neurology 02/2018  Will disc if wants me to order open MRI or wait until f/u neurology  3.  Repeat UA reviewed 10/2017 neg blood  Refer to urology to further w/u complex cyst left kidney and hematuria with h/o #1  4.  See HM 10/21/17 shingrix prev disc'ed  mammo neg 10/16/17  Last pap 09/08/12 neg no HPV testing done ? If had another after this with Dr. Raphael Gibney? Will disc with pt s/p hysterectomy  Colonoscopy 04/12/14 tubular/hyperplastic polpys  Hep C neg 11/2001 and 06/27/15  DEXA  Repeat 11/18/17 h/o osteopenia  She follows with dermatology yearly   Provider: Dr. Olivia Mackie McLean-Scocuzza-Internal Medicine

## 2017-11-15 ENCOUNTER — Ambulatory Visit (INDEPENDENT_AMBULATORY_CARE_PROVIDER_SITE_OTHER): Payer: Medicare Other | Admitting: Urology

## 2017-11-15 ENCOUNTER — Encounter: Payer: Self-pay | Admitting: Urology

## 2017-11-15 VITALS — BP 132/84 | HR 102 | Ht 67.0 in | Wt 144.0 lb

## 2017-11-15 DIAGNOSIS — N281 Cyst of kidney, acquired: Secondary | ICD-10-CM

## 2017-11-15 DIAGNOSIS — R3129 Other microscopic hematuria: Secondary | ICD-10-CM | POA: Diagnosis not present

## 2017-11-15 NOTE — Progress Notes (Signed)
11/15/2017 3:33 PM   Meghan Mack [birth year] [id number]  Referring provider: Burnard Hawthorne, FNP [address] [place], [place] [address]  No chief complaint on file.   HPI: The patient is a 67 year old female presents today for evaluation of history of microscopic hematuria as well as a 1 cm right renal cyst that is partially complex on a CT with contrast only.  She had microscopic hematuria a couple months ago.  She notes symptoms that time.  Urine culture at that time was negative.  No history of gross hematuria.  No recurrent urinary symptoms.  No history of recent UTI.  No history of nephrolithiasis or voiding dysfunction.  She also has is very small after mentioned right renal cyst as partially complex but still somewhat hard to characterize due to small size.   PMH: Past Medical History:  Diagnosis Date  . Anemia   . Colonic polyp   . Constipation   . COPD (chronic obstructive pulmonary disease) ([place])   . Hyperlipidemia   . IBS (irritable bowel syndrome)   . Kidney cysts   . Lung nodule   . Meningioma ([place])   . Nausea   . Palpitations   . Tonsillar cyst     Surgical History: Past Surgical History:  Procedure Laterality Date  . CARDIAC CATHETERIZATION  02/25/2007  . COLONOSCOPY    . ESOPHAGOGASTRODUODENOSCOPY  10/1997 & 08/2005  . FOOT SURGERY  04/25/2007   right  . THYROIDECTOMY  11/2005  . VAGINAL HYSTERECTOMY     ovaries intact    Home Medications:  Allergies as of 11/15/2017      Reactions   Tdap [diphth-acell Pertussis-tetanus] Swelling, Rash   Codeine    Diflucan [fluconazole] Hives   Esomeprazole Magnesium    REACTION: nausea   Meperidine Hcl    REACTION: Causes heart to race   Pneumovax 23 [pneumococcal Vac Polyvalent]    Extensive local reaction with arm swelling and induration   Propantheline Bromide    Propoxyphene N-acetaminophen    REACTION: heart race   Sulfonamide Derivatives       Medication List        Accurate  as of 11/15/17  3:33 PM. Always use your most recent med list.          acyclovir ointment 5 % Commonly known as:  ZOVIRAX Apply 1 application topically every 3 (three) hours.   BYSTOLIC 5 MG tablet Generic drug:  nebivolol TAKE 1 TABLET BY MOUTH EVERY DAY.   ergocalciferol 50000 units capsule Commonly known as:  VITAMIN D2 Take 50,000 Units by mouth every 30 (thirty) days.   levothyroxine 88 MCG tablet Commonly known as:  SYNTHROID, LEVOTHROID Take 1 tablet 6 days a week.   MULTIVITAMIN PO Take 1 tablet by mouth daily.   ranitidine 150 MG tablet Commonly known as:  ZANTAC Take 150 mg by mouth as needed.   simvastatin 20 MG tablet Commonly known as:  ZOCOR TAKE 1 TABLET EVERY EVENING (IMPROVES CHOLESTEROL)       Allergies:  Allergies  Allergen Reactions  . Tdap [Diphth-Acell Pertussis-Tetanus] Swelling and Rash  . Codeine   . Diflucan [Fluconazole] Hives  . Esomeprazole Magnesium     REACTION: nausea  . Meperidine Hcl     REACTION: Causes heart to race  . Pneumovax 23 [Pneumococcal Vac Polyvalent]     Extensive local reaction with arm swelling and induration  . Propantheline Bromide   . Propoxyphene N-Acetaminophen     REACTION:  heart race  . Sulfonamide Derivatives     Family History: Family History  Problem Relation Age of Onset  . Cancer Mother        colon  . Emphysema Father   . Heart disease Father   . Cancer Father        leukemia  . Cancer Brother        lung  . Cancer Maternal Aunt        melanoma  . Cancer Maternal Grandfather        stomach, colon  . Breast cancer Neg Hx   . Kidney cancer Neg Hx   . Kidney disease Neg Hx   . Prostate cancer Neg Hx     Social History:  reports that she quit smoking about 20 years ago. Her smoking use included cigarettes. She has a 2.50 pack-year smoking history. she has never used smokeless tobacco. She reports that she does not drink alcohol or use drugs.  ROS: UROLOGY Frequent Urination?:  No Hard to postpone urination?: No Burning/pain with urination?: No Get up at night to urinate?: Yes Leakage of urine?: No Urine stream starts and stops?: No Trouble starting stream?: No Do you have to strain to urinate?: No Blood in urine?: No Urinary tract infection?: No Sexually transmitted disease?: Yes Injury to kidneys or bladder?: No Painful intercourse?: No Weak stream?: Yes Currently pregnant?: No Vaginal bleeding?: No Last menstrual period?: n  Gastrointestinal Nausea?: No Vomiting?: No Indigestion/heartburn?: No Diarrhea?: Yes Constipation?: No  Constitutional Fever: No Night sweats?: Yes Weight loss?: No Fatigue?: No  Skin Skin rash/lesions?: No Itching?: No  Eyes Blurred vision?: No Double vision?: No  Ears/Nose/Throat Sore throat?: No Sinus problems?: No  Hematologic/Lymphatic Swollen glands?: No Easy bruising?: Yes  Cardiovascular Leg swelling?: Yes Chest pain?: No  Respiratory Cough?: No Shortness of breath?: No  Endocrine Excessive thirst?: No  Musculoskeletal Back pain?: No Joint pain?: No  Neurological Headaches?: No Dizziness?: No  Psychologic Depression?: No Anxiety?: No  Physical Exam: BP 132/84   Pulse (!) 102   Ht 5\' 7"  (1.702 m)   Wt 144 lb (65.3 kg)   BMI 22.55 kg/m   Constitutional:  Alert and oriented, No acute distress. HEENT: [place] AT, moist mucus membranes.  Trachea midline, no masses. Cardiovascular: No clubbing, cyanosis, or edema. Respiratory: Normal respiratory effort, no increased work of breathing. GI: Abdomen is soft, nontender, nondistended, no abdominal masses GU: No CVA tenderness.  Skin: No rashes, bruises or suspicious lesions. Lymph: No cervical or inguinal adenopathy. Neurologic: Grossly intact, no focal deficits, moving all 4 extremities. Psychiatric: Normal mood and affect.  Laboratory Data: Lab Results  Component Value Date   WBC 4.6 09/10/2017   HGB 13.2 09/10/2017   HCT 39.4  09/10/2017   MCV 97.7 09/10/2017   PLT 174.0 09/10/2017    Lab Results  Component Value Date   CREATININE 0.69 09/10/2017    No results found for: PSA  No results found for: TESTOSTERONE  Lab Results  Component Value Date   HGBA1C 5.4 07/15/2017    Urinalysis    Component Value Date/Time   COLORURINE YELLOW 10/22/2017 1311   APPEARANCEUR CLEAR 10/22/2017 1311   LABSPEC 1.010 10/22/2017 1311   PHURINE 6.0 10/22/2017 1311   GLUCOSEU NEGATIVE 10/22/2017 1311   HGBUR NEGATIVE 10/22/2017 1311   HGBUR negative 08/29/2010 0803   BILIRUBINUR NEGATIVE 10/22/2017 1311   BILIRUBINUR neg 09/10/2017 1029   KETONESUR NEGATIVE 10/22/2017 1311   PROTEINUR neg 09/10/2017 1029  UROBILINOGEN 0.2 10/22/2017 1311   NITRITE NEGATIVE 10/22/2017 1311   LEUKOCYTESUR NEGATIVE 10/22/2017 1311    Pertinent Imaging: CT reviewed as above.  Assessment & Plan:    1.  1 cm partially complex right renal cyst I did discuss with the patient this is very small lesion.  It is somewhat hard to characterize due to the lack of CT without images as well as its small size.  I recommended a renal ultrasound in 3 months to assess any potential growth and blood flow.  She is agreeable to this.  2.  Microscopic hematuria I discussed with the patient the standard workup involving a CT followed by office cystoscopy.  She did have asymptomatic microscopic hematuria on one occasion which meets AUA guidelines for a full workup.  Since she has already had her CT scan, we will plan to complete her workup with office cystoscopy at same time as her 62-month follow-up to go over her renal ultrasound.  Return in about 3 months (around 02/12/2018) for for cysto, renal u./s prior.  Nickie Retort, MD  [facility] [address] [place], [place] [address] [phone]

## 2017-11-16 NOTE — Telephone Encounter (Signed)
Acceptable risk for risk for GI procedure No further testing needed No medications to hold

## 2017-11-18 ENCOUNTER — Ambulatory Visit
Admission: RE | Admit: 2017-11-18 | Discharge: 2017-11-18 | Disposition: A | Discharge status: Home / Self Care | Payer: Medicare Other | Source: Ambulatory Visit | Attending: Internal Medicine | Admitting: Internal Medicine

## 2017-11-18 DIAGNOSIS — M85852 Other specified disorders of bone density and structure, left thigh: Secondary | ICD-10-CM | POA: Diagnosis not present

## 2017-11-18 DIAGNOSIS — M8580 Other specified disorders of bone density and structure, unspecified site: Secondary | ICD-10-CM | POA: Diagnosis present

## 2017-11-18 NOTE — Telephone Encounter (Signed)
Clearance routed to number provided.  

## 2017-11-19 ENCOUNTER — Telehealth: Payer: Self-pay

## 2017-11-19 NOTE — Telephone Encounter (Signed)
Copied from [place] [id number]. Topic: Quick Communication - Other Results >> Nov 19, 2017  8:13 AM Synthia Innocent wrote: Would like to speak with nurse regarding bone density results

## 2017-11-19 NOTE — Telephone Encounter (Signed)
Patient was informed of results.  Patient understood and no questions, comments, or concerns at this time.  

## 2017-11-25 ENCOUNTER — Encounter: Payer: Self-pay | Admitting: Cardiovascular Disease

## 2017-11-25 ENCOUNTER — Other Ambulatory Visit: Payer: Self-pay | Admitting: Internal Medicine

## 2017-11-25 ENCOUNTER — Telehealth: Payer: Self-pay

## 2017-11-25 DIAGNOSIS — D329 Benign neoplasm of meninges, unspecified: Secondary | ICD-10-CM

## 2017-11-25 NOTE — Telephone Encounter (Signed)
Spoke with patient she states she saw pulmonologist  and she will have follow up scan in 6 months and see what that shows .

## 2017-11-25 NOTE — Telephone Encounter (Signed)
-----   Message from Lavera Guise, [place] sent at 10/16/2017 11:53 AM EST ----- Regarding: Incidental finding on CT Hi Joycelyn Schmid,  Happy New Year! I wanted to bring your attention to an incidental finding of a 5 x 4 mm nodular opacity in the middle lobe of Ms. Odaniel's right lung. I obtained a CT A/P to evaluate her periumbilical pain and nausea, which was unremarkable for intra-abdominal pathologies but noted this pulmonary finding. Can you continue to monitor this when necessary? The guidelines cited by radiology recommend considering follow-up in 12 months, if high-risk.  Please let me know if you have any further questions. Take care! Sharyn Lull   CT A/P IMPRESSION: 1. No evident bowel obstruction. No abscess. No periappendiceal region appears unremarkable.  2.  No renal or ureteral calculus.  No hydronephrosis.  3. 5 x 4 mm nodular opacity right middle lobe. No follow-up needed if patient is low-risk. Non-contrast chest CT can be considered in 12 months if patient is high-risk. This recommendation follows the consensus statement: Guidelines for Management of Incidental Pulmonary Nodules Detected on CT Images: From the Fleischner Society 2017; Radiology 2017; 284:228-243.  4.  Aortic atherosclerosis.  Aortic Atherosclerosis (ICD10-I70.0).

## 2017-11-25 NOTE — Telephone Encounter (Signed)
Courtesy call- let her know I am back from maternity  See she has been seeing michelle johnson whom messaged me to let me know about right lung nodule.  I would advise CT chest in one year per guidelines.    Also she is a former smoker, she may qualify for annal CT chest screen. Criteria is over 65 and either is smoker or has quit in last 15 years

## 2017-11-25 NOTE — Telephone Encounter (Signed)
Patient inquiring  on status of status of MRI of Brain.  Please advise.

## 2017-11-25 NOTE — Telephone Encounter (Signed)
Please call pt when scheduled MRI brain

## 2017-11-27 ENCOUNTER — Encounter: Payer: Self-pay | Admitting: Emergency Medicine

## 2017-11-27 ENCOUNTER — Emergency Department
Admission: EM | Admit: 2017-11-27 | Discharge: 2017-11-27 | Disposition: A | Discharge status: Home / Self Care | Payer: No Typology Code available for payment source | Attending: Emergency Medicine | Admitting: Emergency Medicine

## 2017-11-27 ENCOUNTER — Other Ambulatory Visit: Payer: Self-pay

## 2017-11-27 ENCOUNTER — Emergency Department: Payer: No Typology Code available for payment source

## 2017-11-27 DIAGNOSIS — J449 Chronic obstructive pulmonary disease, unspecified: Secondary | ICD-10-CM | POA: Diagnosis not present

## 2017-11-27 DIAGNOSIS — Y92410 Unspecified street and highway as the place of occurrence of the external cause: Secondary | ICD-10-CM | POA: Insufficient documentation

## 2017-11-27 DIAGNOSIS — S8002XA Contusion of left knee, initial encounter: Secondary | ICD-10-CM | POA: Diagnosis not present

## 2017-11-27 DIAGNOSIS — Z87891 Personal history of nicotine dependence: Secondary | ICD-10-CM | POA: Insufficient documentation

## 2017-11-27 DIAGNOSIS — Z79899 Other long term (current) drug therapy: Secondary | ICD-10-CM | POA: Diagnosis not present

## 2017-11-27 DIAGNOSIS — S60222A Contusion of left hand, initial encounter: Secondary | ICD-10-CM

## 2017-11-27 DIAGNOSIS — S8992XA Unspecified injury of left lower leg, initial encounter: Secondary | ICD-10-CM | POA: Diagnosis present

## 2017-11-27 DIAGNOSIS — R0789 Other chest pain: Secondary | ICD-10-CM

## 2017-11-27 DIAGNOSIS — Y999 Unspecified external cause status: Secondary | ICD-10-CM | POA: Insufficient documentation

## 2017-11-27 DIAGNOSIS — Y9389 Activity, other specified: Secondary | ICD-10-CM | POA: Insufficient documentation

## 2017-11-27 MED ORDER — IBUPROFEN 600 MG PO TABS: 600.0000 mg | ORAL_TABLET | Freq: Three times a day (TID) | ORAL | 0 refills | Status: DC | PRN

## 2017-11-27 NOTE — ED Notes (Signed)
Pt ambulatory to POV. VSS. NAD. Discharge instructions, RX, and follow up discussed. All questions answered.
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
## 2017-12-01 ENCOUNTER — Ambulatory Visit
Admission: RE | Admit: 2017-12-01 | Discharge: 2017-12-01 | Disposition: A | Discharge status: Home / Self Care | Payer: Medicare Other | Source: Ambulatory Visit | Attending: Internal Medicine | Admitting: Internal Medicine

## 2017-12-01 DIAGNOSIS — D329 Benign neoplasm of meninges, unspecified: Secondary | ICD-10-CM

## 2017-12-01 MED ORDER — GADOBENATE DIMEGLUMINE 529 MG/ML IV SOLN
13.0000 mL | Freq: Once | INTRAVENOUS | Status: AC | PRN
  Administered 2017-12-01: 13 mL via INTRAVENOUS

## 2017-12-17 ENCOUNTER — Encounter: Payer: Self-pay | Admitting: Infectious Diseases

## 2017-12-17 ENCOUNTER — Encounter: Payer: Self-pay | Admitting: Family

## 2017-12-17 DIAGNOSIS — R9389 Abnormal findings on diagnostic imaging of other specified body structures: Secondary | ICD-10-CM | POA: Insufficient documentation

## 2017-12-20 ENCOUNTER — Telehealth: Payer: Self-pay | Admitting: Neurology

## 2017-12-20 ENCOUNTER — Encounter: Payer: Self-pay | Admitting: Neurology

## 2017-12-20 ENCOUNTER — Ambulatory Visit (INDEPENDENT_AMBULATORY_CARE_PROVIDER_SITE_OTHER): Payer: Medicare Other | Admitting: Neurology

## 2017-12-20 VITALS — BP 118/76 | HR 72 | Ht 67.5 in | Wt 145.0 lb

## 2017-12-20 DIAGNOSIS — R41844 Frontal lobe and executive function deficit: Secondary | ICD-10-CM | POA: Diagnosis not present

## 2017-12-20 DIAGNOSIS — W19XXXD Unspecified fall, subsequent encounter: Secondary | ICD-10-CM | POA: Diagnosis not present

## 2017-12-20 DIAGNOSIS — D329 Benign neoplasm of meninges, unspecified: Secondary | ICD-10-CM | POA: Diagnosis not present

## 2017-12-20 NOTE — Progress Notes (Signed)
NEUROLOGY FOLLOW UP OFFICE NOTE  Meghan Mack [id number]  HISTORY OF PRESENT ILLNESS: Meghan Mack is a 67 year old right-handed female with COPD, IBS, anxiety, Sjogren's, hypothyroidism and hyperlipidemia who follows up for meningioma and memory loss.     UPDATE: I have not seen Ms. Fanning since October 2017.  She follows up regarding monitoring of her meningioma.  Due to unexplained weight loss, she underwent a malignancy workup.  She had a repeat MRI of brain with and without contrast on 12/01/17, which was personally reviewed and demonstrates that the meningioma is unchanged.  She still reports some memory issues.  She particularly notes difficulty remembering names, maybe slightly worse over the past 2 years.  She still occasionally has problems with falls.  Her last fall was 3 months ago.   HISTORY: Meningioma: She has known anterior left tentorial meningioma since 2008.  For several years, she has been followed by Dr. Sherwood Gambler at [facility].  Observation was recommended.  Most recent MRI of brain available from 01/24/14 showed it to be 9 x 9 x 6 mm, which appears stable compared to prior imaging from 2013, 2011, 2009 and 2008 (measuring around 8 x7 mm to 9 x 6 x 6 mm).  There is no surrounding vasogenic edema.  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  MRI of brain with and without contrast from 01/07/16 showed stable sub-centimeter left posterior fossa meningioma.   Memory: She reports increased memory problems.  When she sees an object, she has trouble remembering the name, although she recognizes it.  This has been happening more frequently.  She also has had trouble recalling names of people she knows and sees frequently, such as the name of her close friend's wife.  On occasion, she has needed to get out the recipe of a cake that she has baked on multiple occasions for years.  Rarely, she may briefly be disoriented while  driving on familiar routes.  B12 was 810.  She has hypothyroidism due to surgical resection for Hashimoto's and takes Synthroid.  She underwent neuropsychological testing on 10/27/15.  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  She exhibited difficulty performing complex planning/organization, problem solving, and learning, as well as demonstrating moderate motor preservation.  She did not exhibit amnestic memory deficit, but rather it is the executive dysfunction that is impacting her memory.  However, she did not meet criteria for dementia and does not demonstrate evidence for frontotemporal dementia, as she has no reported behavioral or language changes.    She also met criteria for mild generalized anxiety disorder.   Lower extremity weakness: Also, over the past year, she reports falls.  She has tripped and fallen about 4 times.  She says that her right leg just doesn't "want to move".  If she is squatting, she is unable to stand up without pushing off with her arms.  She denies numbness in the feet.  Sometimes, if she clutches something hard or opens up a jar, it is difficult for her to open her hand.  She denies problems swallowing or shortness of breath.  She exhibited lower extremity weakness and intrinsic hand muscle weakness.  CK was 41.  TSH was 0.428 (she has hypothyroidism and had adjustment made to her medication).  NCV-EMG on 12/06/15, which was normal.  MRI of the cervical spine on 12/17/15 was unremarkable.  MRI of thoracic spine showed minimal discogenic edema  anteriorly at T10-11 as well as benign hemangioma on the right side of the T5 vertebral body.  MRI of lumbar spine showed shallow broad-based disc herniation at L5-S1, with mild facet degeneration and hypertrophy bilaterally, which potentially could affect S1 nerve root, but no obvious impingement seen.  She was evaluated by Dr. Posey Pronto, a neuromuscular specialist.   She did not appreciate any abnormalities on her exam.  History and exam findings were not suspicious for a myopathy, motor neuron disease or neuromuscular junction disorder.   Continued exercise and PT was recommended.     Her mother developed dementia in her late 52s.  She began having gait issues in her 16s or 14s.  Otherwise, she denies other family history of dementia or gait problems.  PAST MEDICAL HISTORY: Past Medical History:  Diagnosis Date  . Anemia   . Colonic polyp   . Constipation   . COPD (chronic obstructive pulmonary disease) ([place])   . Hyperlipidemia   . IBS (irritable bowel syndrome)   . Kidney cysts   . Lung nodule   . Meningioma ([place])   . Nausea   . Palpitations   . Tonsillar cyst     MEDICATIONS: Current Outpatient Medications on File Prior to Visit  Medication Sig Dispense Refill  . acyclovir ointment (ZOVIRAX) 5 % Apply 1 application topically every 3 (three) hours.     Marland Kitchen BYSTOLIC 5 MG tablet TAKE 1 TABLET BY MOUTH EVERY DAY. 90 tablet 2  . ergocalciferol (VITAMIN D2) 50000 UNITS capsule Take 50,000 Units by mouth every 30 (thirty) days.    Marland Kitchen ibuprofen (ADVIL,MOTRIN) 600 MG tablet Take 1 tablet (600 mg total) by mouth every 8 (eight) hours as needed. 20 tablet 0  . levothyroxine (SYNTHROID, LEVOTHROID) 88 MCG tablet Take 1 tablet 6 days a week.    . Multiple Vitamin (MULTIVITAMIN PO) Take 1 tablet by mouth daily.      . ranitidine (ZANTAC) 150 MG tablet Take 150 mg by mouth as needed.      . simvastatin (ZOCOR) 20 MG tablet TAKE 1 TABLET EVERY EVENING (IMPROVES CHOLESTEROL) 90 tablet 3   No current facility-administered medications on file prior to visit.     ALLERGIES: Allergies  Allergen Reactions  . Tdap [Diphth-Acell Pertussis-Tetanus] Swelling and Rash  . Codeine   . Diflucan [Fluconazole] Hives  . Esomeprazole Magnesium     REACTION: nausea  . Meperidine Hcl     REACTION: Causes heart to race  . Pneumovax 23 [Pneumococcal Vac Polyvalent]      Extensive local reaction with arm swelling and induration  . Propantheline Bromide   . Propoxyphene N-Acetaminophen     REACTION: heart race  . Sulfonamide Derivatives     FAMILY HISTORY: Family History  Problem Relation Age of Onset  . Cancer Mother        colon  . Emphysema Father   . Heart disease Father   . Cancer Father        leukemia  . Cancer Brother        lung  . Cancer Maternal Aunt        melanoma  . Cancer Maternal Grandfather        stomach, colon  . Breast cancer Neg Hx   . Kidney cancer Neg Hx   . Kidney disease Neg Hx   . Prostate cancer Neg Hx     SOCIAL HISTORY: Social History   Socioeconomic History  . Marital status: Married    Spouse name:  Not on file  . Number of children: 2  . Years of education: Not on file  . Highest education level: Not on file  Social Needs  . Financial resource strain: Not on file  . Food insecurity - worry: Not on file  . Food insecurity - inability: Not on file  . Transportation needs - medical: Not on file  . Transportation needs - non-medical: Not on file  Occupational History  . Occupation: Metallurgist: [employer]  Tobacco Use  . Smoking status: Former Smoker    Packs/day: 0.25    Years: 10.00    Pack years: 2.50    Types: Cigarettes    Last attempt to quit: 09/25/1997    Years since quitting: 20.2  . Smokeless tobacco: Never Used  Substance and Sexual Activity  . Alcohol use: No    Alcohol/week: 0.6 oz    Types: 1 Standard drinks or equivalent per week  . Drug use: No  . Sexual activity: Yes    Partners: Male    Birth control/protection: Surgical  Other Topics Concern  . Not on file  Social History Narrative   Lives with husband in [place], has 2 children and 2 step-children. No pets.      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       Diet - regular   Exercise - walks 5 days per week   1.5 years of college    REVIEW OF SYSTEMS: Constitutional: No fevers,  chills, or sweats, no generalized fatigue, change in appetite Eyes: No visual changes, double vision, eye pain Ear, nose and throat: No hearing loss, ear pain, nasal congestion, sore throat Cardiovascular: No chest pain, palpitations Respiratory:  No shortness of breath at rest or with exertion, wheezes GastrointestinaI: No nausea, vomiting, diarrhea, abdominal pain, fecal incontinence Genitourinary:  No dysuria, urinary retention or frequency Musculoskeletal:  No neck pain, back pain Integumentary: No rash, pruritus, skin lesions Neurological: as above Psychiatric: No depression, insomnia, anxiety Endocrine: No palpitations, fatigue, diaphoresis, mood swings, change in appetite, change in weight, increased thirst Hematologic/Lymphatic:  No purpura, petechiae. Allergic/Immunologic: no itchy/runny eyes, nasal congestion, recent allergic reactions, rashes  PHYSICAL EXAM: Vitals:   12/20/17 1235  BP: 118/76  Pulse: 72   General: No acute distress.  Patient appears well-groomed.   Head:  Normocephalic/atraumatic Eyes:  Fundi examined but not visualized Neck: supple, no paraspinal tenderness, full range of motion Heart:  Regular rate and rhythm Lungs:  Clear to auscultation bilaterally Back: No paraspinal tenderness Neurological Exam: alert and oriented to person, place, and time. Attention span and concentration intact, recent and remote memory intact, fund of knowledge intact.  Speech fluent and not dysarthric, language intact.  CN II-XII intact. Bulk and tone normal, muscle strength 5/5 throughout.  Sensation to light touch, temperature and vibration intact.  Deep tendon reflexes 2+ throughout, toes downgoing.  Finger to nose and heel to shin testing intact.  Gait normal, Romberg negative.  IMPRESSION: Left tentorial meningioma, stable over past 11 years. Executive dysfunction.  Unclear etiology.  Possibly related to anxiety.  No evidence of FTD or FTLD. Falls.  Unclear etiology.   Extensive workup was been negative.  PLAN: 1.  I recommended repeat neurocognitive testing to evaluate for any changes.  She may follow up with again with Dr. Conley Canal or here with Dr. Si Raider.  She will get back to [place].  If there are any concerning changes, she should follow up  with me. 2.  To monitor meningioma, she doesn't need to repeat MRI of brain with and without contrast for 5 to 10 years.  25 minutes spent face to face with patient, over 50% spent discussing MRI finding and management.  Metta Clines, DO

## 2017-12-20 NOTE — Patient Instructions (Signed)
1.  The meningioma has been unchanged for over 10 years.  At this point, we don't need to repeat MRI for 5 to 10 years. 2.  I want to repeat cognitive testing.  We have Dr. Si Raider in our office, however she is booked out 5 months.  Contact Dr. Jenne Campus office to see if you can have repeat cognitive testing.  If she is booked out for several months, then contact me and make an appointment with Dr. Si Raider.

## 2017-12-20 NOTE — Telephone Encounter (Signed)
Pt called and said that [facility] is requiring a referral from Dr Tomi Likens for pt to be seen

## 2017-12-20 NOTE — Telephone Encounter (Signed)
Ok to send referral to Dr. Conley Canal?

## 2017-12-20 NOTE — Telephone Encounter (Signed)
yes

## 2017-12-23 NOTE — Telephone Encounter (Signed)
Referral sent 

## 2017-12-25 ENCOUNTER — Telehealth: Payer: Self-pay | Admitting: Cardiovascular Disease

## 2017-12-25 ENCOUNTER — Ambulatory Visit: Payer: Medicare Other

## 2017-12-25 MED ORDER — BYSTOLIC 5 MG PO TABS: 5.0000 mg | ORAL_TABLET | Freq: Every day | ORAL | 6 refills | Status: DC

## 2017-12-25 MED ORDER — SIMVASTATIN 20 MG PO TABS: ORAL_TABLET | ORAL | 6 refills | Status: DC

## 2017-12-25 NOTE — Telephone Encounter (Signed)
I called and spoke with the patient. She states she is switching pharmacies. She wanted to ask about generic simvastatin- she states that when she takes simvastatin from the [employer], then she has side effects with this med, but if she has it from a Risk analyst, she is able to tolerate. She wanted to know if this sounded "crazy."  I advised the patient that this could be a possibility as different manufacturers use different fillers for their medications and she may be reacting to the particular filler that this company uses. I made her aware that when we send in her refill we can add a note to the pharmacy to please not dispense Simvastatin from the [employer] manufacturer due to side effects.  She is agreeable with this. She also takes brand bystolic (this is not out generic). She would like [facility] to [facility] [address] for [facility] day supply. RX's have been sent to the pharmacy.

## 2017-12-25 NOTE — Telephone Encounter (Signed)
*  STAT* If patient is at the pharmacy, call can be transferred to refill team.   1. Which medications need to be refilled? (please list name of each medication and dose if known) Bystolic 5 mg, Simvastatin 20 mg  2. Which pharmacy/location (including street and city if local pharmacy) is medication to be sent to? [facility]  3. Do they need a 30 day or 90 day supply? 64   Pt would like a written rx for Simvastatin and Bystolic. Please call before calling in.

## 2018-01-01 ENCOUNTER — Ambulatory Visit (INDEPENDENT_AMBULATORY_CARE_PROVIDER_SITE_OTHER): Payer: Medicare Other

## 2018-01-01 VITALS — BP 110/70 | HR 81 | Temp 98.1°F | Resp 14 | Ht 67.5 in | Wt 145.4 lb

## 2018-01-01 DIAGNOSIS — Z0000 Encounter for general adult medical examination without abnormal findings: Secondary | ICD-10-CM

## 2018-01-01 NOTE — Progress Notes (Addendum)
Subjective:   Meghan Mack is a 67 y.o. female who presents for Medicare Annual (Subsequent) preventive examination.  Review of Systems:  No ROS.  Medicare Wellness Visit. Additional risk factors are reflected in the social history. Cardiac Risk Factors include: advanced age (>39men, >73 women)     Objective:     Vitals: BP 110/70 (BP Location: Left Arm, Patient Position: Sitting, Cuff Size: Normal)   Pulse 81   Temp 98.1 F (36.7 C) (Oral)   Resp 14   Ht 5' 7.5" (1.715 m)   Wt 145 lb 6.4 oz (66 kg)   SpO2 98%   BMI 22.44 kg/m   Body mass index is 22.44 kg/m.  Advanced Directives 01/01/2018 11/27/2017  Does Patient Have a Medical Advance Directive? Yes No  Type of Paramedic of [place];Living will -  Does patient want to make changes to medical advance directive? No - Patient declined -  Copy of [place] in Chart? No - copy requested -  Would patient like information on creating a medical advance directive? - No - Patient declined    Tobacco Social History   Tobacco Use  Smoking Status Former Smoker  . Packs/day: 0.25  . Years: 10.00  . Pack years: 2.50  . Types: Cigarettes  . Last attempt to quit: 09/25/1997  . Years since quitting: 20.2  Smokeless Tobacco Never Used     Counseling given: Not Answered   Clinical Intake:  Pre-visit preparation completed: Yes  Pain : No/denies pain     Nutritional Status: BMI of 19-24  Normal Diabetes: No  How often do you need to have someone help you when you read instructions, pamphlets, or other written materials from your doctor or pharmacy?: 1 - Never  Interpreter Needed?: No     Past Medical History:  Diagnosis Date  . Anemia   . Colonic polyp   . Constipation   . COPD (chronic obstructive pulmonary disease) ([place])   . Hyperlipidemia   . IBS (irritable bowel syndrome)   . Kidney cysts   . Lung nodule   . Meningioma ([place])   . Nausea   . Palpitations   .  Tonsillar cyst    Past Surgical History:  Procedure Laterality Date  . CARDIAC CATHETERIZATION  02/25/2007  . COLONOSCOPY    . ESOPHAGOGASTRODUODENOSCOPY  10/1997 & 08/2005  . FOOT SURGERY  04/25/2007   right  . THYROIDECTOMY  11/2005  . VAGINAL HYSTERECTOMY     ovaries intact   Family History  Problem Relation Age of Onset  . Cancer Mother        colon  . Emphysema Father   . Heart disease Father   . Cancer Father        leukemia  . Cancer Brother        lung  . Cancer Maternal Aunt        melanoma  . Cancer Maternal Grandfather        stomach, colon  . Breast cancer Neg Hx   . Kidney cancer Neg Hx   . Kidney disease Neg Hx   . Prostate cancer Neg Hx    Social History   Socioeconomic History  . Marital status: Married    Spouse name: None  . Number of children: 2  . Years of education: None  . Highest education level: None  Social Needs  . Financial resource strain: None  . Food insecurity - worry: Never true  .  Food insecurity - inability: Never true  . Transportation needs - medical: No  . Transportation needs - non-medical: No  Occupational History  . Occupation: Metallurgist: [employer]  Tobacco Use  . Smoking status: Former Smoker    Packs/day: 0.25    Years: 10.00    Pack years: 2.50    Types: Cigarettes    Last attempt to quit: 09/25/1997    Years since quitting: 20.2  . Smokeless tobacco: Never Used  Substance and Sexual Activity  . Alcohol use: No    Alcohol/week: 0.6 oz    Types: 1 Standard drinks or equivalent per week  . Drug use: No  . Sexual activity: Yes    Partners: Male    Birth control/protection: Surgical  Other Topics Concern  . None  Social History Narrative   Lives with husband in [place], has 2 children and 2 step-children. No pets.      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       Diet - regular   Exercise - walks 5 days per week   1.5 years of college    Outpatient Encounter  Medications as of 01/01/2018  Medication Sig  . acyclovir ointment (ZOVIRAX) 5 % Apply 1 application topically every 3 (three) hours.   Marland Kitchen BYSTOLIC 5 MG tablet Take 1 tablet (5 mg total) by mouth daily.  . ergocalciferol (VITAMIN D2) 50000 UNITS capsule Take 50,000 Units by mouth every 30 (thirty) days.  Marland Kitchen ibuprofen (ADVIL,MOTRIN) 600 MG tablet Take 1 tablet (600 mg total) by mouth every 8 (eight) hours as needed.  Marland Kitchen levothyroxine (SYNTHROID, LEVOTHROID) 88 MCG tablet Take 1 tablet 6 days a week.  . Multiple Vitamin (MULTIVITAMIN PO) Take 1 tablet by mouth daily.    . ranitidine (ZANTAC) 150 MG tablet Take 150 mg by mouth as needed.    . simvastatin (ZOCOR) 20 MG tablet TAKE 1 TABLET (20 mg) EVERY EVENING (IMPROVES CHOLESTEROL)   No facility-administered encounter medications on file as of 01/01/2018.     Activities of Daily Living In your present state of health, do you have any difficulty performing the following activities: 01/01/2018  Hearing? N  Vision? N  Difficulty concentrating or making decisions? Y  Comment Difficulty remembering  Walking or climbing stairs? N  Dressing or bathing? N  Doing errands, shopping? N  Preparing Food and eating ? N  Using the Toilet? N  In the past six months, have you accidently leaked urine? N  Do you have problems with loss of bowel control? N  Managing your Medications? N  Managing your Finances? N  Housekeeping or managing your Housekeeping? N  Some recent data might be hidden    Patient Care Team: Burnard Hawthorne, FNP as PCP - General (Family Medicine) Minna Merritts, MD as Consulting Physician (Cardiology)    Assessment:   This is a routine wellness examination for Meghan Mack.  The goal of the wellness visit is to assist the patient how to close the gaps in care and create a preventative care plan for the patient.   The roster of all physicians providing medical care to patient is listed in the Snapshot section of the chart.  Taking  calcium VIT D as appropriate, Osteopenia discussed. Osteoporosis risk reviewed.    Safety issues reviewed; Smoke and carbon monoxide detectors in the home. No firearms in the home. Wears seatbelts when driving or riding with others. No violence in the  home.  They do not have excessive sun exposure.  Discussed the need for sun protection: hats, long sleeves and the use of sunscreen if there is significant sun exposure.  Patient is alert, normal appearance, oriented to person/place/and time.  Displays appropriate judgement.  No new identified risk were noted.  No failures at ADL's or IADL's.    BMI- discussed the importance of a healthy diet, water intake and the benefits of aerobic exercise. Educational material provided.   24 hour diet recall: Regular diet.  Avoids acidic foods.   Dental- every 6 months.  Dr. Carman Ching.  Eye- Visual acuity not assessed per patient preference since they have regular follow up with the ophthalmologist.    Sleep patterns- Sleeps 6 hours at night.  Wakes feeling rested.  2 of 2 Pneumovax 23 vaccine declined.  Hx of aggressive swelling/redness to site in the past.  Health maintenance gaps- closed.  Patient Concerns: None at this time. Follow up with PCP as needed.  Exercise Activities and Dietary recommendations Current Exercise Habits: Home exercise routine, Time (Minutes): 30, Frequency (Times/Week): 3, Weekly Exercise (Minutes/Week): 90, Intensity: Mild  Goals    . Increase physical activity     Exercise.  Increase steps/walk        Fall Risk Fall Risk  01/01/2018 12/20/2017 07/08/2017 12/10/2016 08/07/2016  Falls in the past year? Yes Yes No No Yes  Number falls in past yr: 1 2 or more - - 2 or more  Injury with Fall? No No - - No  Risk Factor Category  - - - - High Fall Risk  Risk for fall due to : - - - - (No Data)  Risk for fall due to: Comment - - - - Patient seeing Neurology for SX  Follow up Falls prevention discussed - - - -    Depression Screen PHQ 2/9 Scores 01/01/2018 07/08/2017 12/10/2016 08/07/2016  PHQ - 2 Score 0 0 0 0     Cognitive Function MMSE - Mini Mental State Exam 01/01/2018 08/03/2015  Not completed: Refused -  Orientation to time - 5  Orientation to Place - 5  Registration - 3  Attention/ Calculation - 4  Recall - 2  Language- name 2 objects - 2  Language- repeat - 1  Language- follow 3 step command - 3  Language- read & follow direction - 1  Write a sentence - 1  Copy design - 1  Total score - 28   Montreal Cognitive Assessment  01/19/2016  Visuospatial/ Executive (0/5) 3  Naming (0/3) 3  Attention: Read list of digits (0/2) 2  Attention: Read list of letters (0/1) 1  Attention: Serial 7 subtraction starting at 100 (0/3) 0  Language: Repeat phrase (0/2) 2  Language : Fluency (0/1) 1  Abstraction (0/2) 2  Delayed Recall (0/5) 3  Orientation (0/6) 6  Total 23  Adjusted Score (based on education) 23      Immunization History  Administered Date(s) Administered  . Influenza Split 07/15/2011, 06/26/2014  . Influenza Whole 06/22/2009, 06/13/2010, 07/05/2012  . Influenza-Unspecified 06/15/2013, 06/19/2015, 06/29/2016  . Pneumococcal Conjugate-13 06/27/2015  . Pneumococcal Polysaccharide-23 08/11/2007  . Td 07/17/2001  . Tdap 09/03/2011  . Zoster 06/13/2010   Screening Tests Health Maintenance  Topic Date Due  . COLONOSCOPY  04/13/2019  . MAMMOGRAM  10/05/2019  . TETANUS/TDAP  09/02/2021  . INFLUENZA VACCINE  Completed  . DEXA SCAN  Completed  . Hepatitis C Screening  Completed  . PNA vac Low  Risk Adult  Discontinued      Plan:    End of life planning; Advance aging; Advanced directives discussed. Copy of current HCPOA/Living Will requested.    I have personally reviewed and noted the following in the patient's chart:   . Medical and social history . Use of alcohol, tobacco or illicit drugs  . Current medications and supplements . Functional ability and  status . Nutritional status . Physical activity . Advanced directives . List of other physicians . Hospitalizations, surgeries, and ER visits in previous 12 months . Vitals . Screenings to include cognitive, depression, and falls . Referrals and appointments  In addition, I have reviewed and discussed with patient certain preventive protocols, quality metrics, and best practice recommendations. A written personalized care plan for preventive services as well as general preventive health recommendations were provided to patient.     Varney Biles, LPN  01/21/8118  Agree with plan. Mable Paris, NP

## 2018-01-01 NOTE — Patient Instructions (Addendum)
  Meghan Mack , Thank you for taking time to come for your Medicare Wellness Visit. I appreciate your ongoing commitment to your health goals. Please review the following plan we discussed and let me know if I can assist you in the future.   These are the goals we discussed: Goals    . Increase physical activity     Exercise.  Increase steps/walk        This is a list of the screening recommended for you and due dates:  Health Maintenance  Topic Date Due  . Colon Cancer Screening  04/13/2019  . Mammogram  10/05/2019  . Tetanus Vaccine  09/02/2021  . Flu Shot  Completed  . DEXA scan (bone density measurement)  Completed  .  Hepatitis C: One time screening is recommended by Center for Disease Control  (CDC) for  adults born from [birth year] through 1966.   Completed  . Pneumonia vaccines  Discontinued

## 2018-02-05 ENCOUNTER — Ambulatory Visit: Payer: Medicare Other | Admitting: Internal Medicine

## 2018-02-12 ENCOUNTER — Ambulatory Visit: Payer: Medicare Other | Admitting: Neurology

## 2018-02-12 ENCOUNTER — Encounter

## 2018-02-14 ENCOUNTER — Ambulatory Visit: Payer: Medicare Other | Admitting: Family

## 2018-02-14 ENCOUNTER — Other Ambulatory Visit: Payer: Medicare Other

## 2018-03-14 ENCOUNTER — Ambulatory Visit: Payer: Self-pay | Admitting: *Deleted

## 2018-03-14 NOTE — Telephone Encounter (Signed)
  Called in c/o burning and itching with urination for 4 days.   She is going out of state next week and wanted to get this checked prior to leaving town. I was looking for an appt with Mable Paris, FNP (pt requested to only see her) and only appt available was for today at 3:00.   The pt decided to go to the [facility] or urgent care today due to her schedule. I let her know to call us back if that did not work out for her.   She verbalized understanding.  Reason for Disposition . Urinating more frequently than usual (i.e., frequency)  Answer Assessment - Initial Assessment Questions 1. SYMPTOM: "What's the main symptom you're concerned about?" (e.g., frequency, incontinence)     I'm having itching and burning. 2. ONSET: "When did the  ________  start?"     4 days.    Last one I had was a couple years ago. 3. PAIN: "Is there any pain?" If so, ask: "How bad is it?" (Scale: 1-10; mild, moderate, severe)     Just the burning.   Lower back hurting intermittently for the last 4 days. 4. CAUSE: "What do you think is causing the symptoms?"     UTI  I'm going out of state next week so wanted to be sure I didn't have an infection. 5. OTHER SYMPTOMS: "Do you have any other symptoms?" (e.g., fever, flank pain, blood in urine, pain with urination)     Has an odor but no blood.   Frequency and dribble. 6. PREGNANCY: "Is there any chance you are pregnant?" "When was your last menstrual period?"     Not asked  Protocols used: URINARY [facility]

## 2018-03-21 ENCOUNTER — Telehealth: Payer: Self-pay | Admitting: Cardiovascular Disease

## 2018-03-21 NOTE — Telephone Encounter (Signed)
To Dr. Gollan/ Pam, RN to review. 

## 2018-03-21 NOTE — Telephone Encounter (Signed)
Patient has 1 yr fu in July and wants to know if she should get routine labs done prior to .  She gets a paper order and goes to [facility].  Please call to advise.

## 2018-03-21 NOTE — Telephone Encounter (Signed)
Spoke with patient and instructed her to come at her next appointment fasting so that we can do her labs that day. She verbalized understanding with no further questions at this time.

## 2018-04-01 ENCOUNTER — Ambulatory Visit: Payer: Medicare Other | Admitting: Cardiovascular Disease

## 2018-04-12 NOTE — Progress Notes (Deleted)
Cardiology Office Note  Date:  04/12/2018   ID:  Meghan Mack, DOB 19-Apr-1951, MRN [id number]  PCP:  Burnard Hawthorne, FNP   No chief complaint on file.   HPI:  Meghan Mack is a very pleasant 67 year old woman with a history of palpitations,  anxiety,  remote history of chest pain,  hyperlipidemia  Prior 30 day monitor in 2014 showed APCs and short runs of atrial tachycardia/SVT with rate 166 bpm hypertension with a strong family history of coronary artery disease  who presents for routine followup  Of her palpitations and tachycardia.  Rare palpitations, Takes low-dose beta blocker Active, no complaints Went on camping trip for 3 months around the [facility] Wore compression hose for her leg swelling with good effect  active, walking more Weight loss 10 pounds without trying, just eating less Tolerating simvastatin  Lab work reviewed with her, total cholesterol 140, LDL <70  EKG on today's visit shows normal sinus rhythm with rate 71 bpm, no significant ST or T-wave changes     PMH:   has a past medical history of Anemia, Colonic polyp, Constipation, COPD (chronic obstructive pulmonary disease) (HCC), Hyperlipidemia, IBS (irritable bowel syndrome), Kidney cysts, Lung nodule, Meningioma (HCC), Nausea, Palpitations, and Tonsillar cyst.  PSH:    Past Surgical History:  Procedure Laterality Date  . CARDIAC CATHETERIZATION  02/25/2007  . COLONOSCOPY    . ESOPHAGOGASTRODUODENOSCOPY  10/1997 & 08/2005  . FOOT SURGERY  04/25/2007   right  . THYROIDECTOMY  11/2005  . VAGINAL HYSTERECTOMY     ovaries intact    Current Outpatient Medications  Medication Sig Dispense Refill  . acyclovir ointment (ZOVIRAX) 5 % Apply 1 application topically every 3 (three) hours.     Marland Kitchen BYSTOLIC 5 MG tablet Take 1 tablet (5 mg total) by mouth daily. 30 tablet 6  . ergocalciferol (VITAMIN D2) 50000 UNITS capsule Take 50,000 Units by mouth every 30 (thirty) days.    Marland Kitchen ibuprofen  (ADVIL,MOTRIN) 600 MG tablet Take 1 tablet (600 mg total) by mouth every 8 (eight) hours as needed. 20 tablet 0  . levothyroxine (SYNTHROID, LEVOTHROID) 88 MCG tablet Take 1 tablet 6 days a week.    . Multiple Vitamin (MULTIVITAMIN PO) Take 1 tablet by mouth daily.      . ranitidine (ZANTAC) 150 MG tablet Take 150 mg by mouth as needed.      . simvastatin (ZOCOR) 20 MG tablet TAKE 1 TABLET (20 mg) EVERY EVENING (IMPROVES CHOLESTEROL) 30 tablet 6   No current facility-administered medications for this visit.      Allergies:   Tdap [tetanus-diphth-acell pertussis]; Codeine; Diflucan [fluconazole]; Esomeprazole magnesium; Meperidine hcl; Pneumovax 23 [pneumococcal vac polyvalent]; Propantheline bromide; Propoxyphene n-acetaminophen; and Sulfonamide derivatives   Social History:  The patient  reports that she quit smoking about 20 years ago. Her smoking use included cigarettes. She has a 2.50 pack-year smoking history. She has never used smokeless tobacco. She reports that she does not drink alcohol or use drugs.   Family History:   family history includes Cancer in her brother, father, maternal aunt, maternal grandfather, and mother; Emphysema in her father; Heart disease in her father.    Review of Systems: Review of Systems  Constitutional: Negative.   Respiratory: Negative.   Cardiovascular: Negative.   Gastrointestinal: Negative.   Musculoskeletal: Negative.   Neurological: Negative.   Psychiatric/Behavioral: Negative.   All other systems reviewed and are negative.    PHYSICAL EXAM: VS:  There were no vitals  taken for this visit. , BMI There is no height or weight on file to calculate BMI. GEN: Well nourished, well developed, in no acute distress  HEENT: normal  Neck: no JVD, carotid bruits, or masses Cardiac: RRR; no murmurs, rubs, or gallops,no edema  Respiratory:  clear to auscultation bilaterally, normal work of breathing GI: soft, nontender, nondistended, + BS MS: no  deformity or atrophy  Skin: warm and dry, no rash Neuro:  Strength and sensation are intact Psych: euthymic mood, full affect    Recent Labs: 07/15/2017: TSH 0.990 09/10/2017: ALT 18; BUN 7; Creatinine, Ser 0.69; Hemoglobin 13.2; Platelets 174.0; Potassium 3.9; Sodium 140    Lipid Panel Lab Results  Component Value Date   CHOL 154 07/15/2017   HDL 73 07/15/2017   LDLCALC 57 07/15/2017   TRIG 120 07/15/2017      Wt Readings from Last 3 Encounters:  01/01/18 145 lb 6.4 oz (66 kg)  12/20/17 145 lb (65.8 kg)  11/27/17 144 lb (65.3 kg)       ASSESSMENT AND PLAN:  Palpitations - Plan: EKG 12-Lead Improved symptoms on low-dose beta blocker  Tachycardia - Plan: EKG 12-Lead Previous event monitor showing 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 She is symptomatic but does not want additional medications at this time. If she has worsening symptoms we could increase her beta blocker. We could even add propranolol as needed. She has this with her   Obstructive sleep apnea - Plan: EKG 12-Lead  TOBACCO ABUSE - Plan: EKG 12-Lead Prior history of smoking  Mixed hyperlipidemia - Plan: EKG 12-Lead Cholesterol is at goal on the current lipid regimen. No changes to the medications were made.   Disposition:   F/U  12 months  Total encounter time more than 25 minutes  Greater than 50% was spent in counseling and coordination of care with the patient    No orders of the defined types were placed in this encounter.    Signed, Esmond Plants, M.D., Ph.D. 04/12/2018  [place], [place]

## 2018-04-14 ENCOUNTER — Ambulatory Visit: Payer: Medicare Other | Admitting: Cardiovascular Disease

## 2018-04-28 ENCOUNTER — Ambulatory Visit
Admission: RE | Admit: 2018-04-28 | Discharge: 2018-04-28 | Disposition: A | Discharge status: Home / Self Care | Payer: Medicare Other | Source: Ambulatory Visit | Attending: Pulmonary Disease | Admitting: Pulmonary Disease

## 2018-04-28 DIAGNOSIS — I700 Atherosclerosis of aorta: Secondary | ICD-10-CM | POA: Diagnosis not present

## 2018-04-28 DIAGNOSIS — R911 Solitary pulmonary nodule: Secondary | ICD-10-CM | POA: Insufficient documentation

## 2018-04-28 DIAGNOSIS — R918 Other nonspecific abnormal finding of lung field: Secondary | ICD-10-CM | POA: Insufficient documentation

## 2018-04-29 ENCOUNTER — Ambulatory Visit: Payer: Medicare Other | Admitting: Pulmonary Disease

## 2018-04-29 ENCOUNTER — Encounter: Payer: Self-pay | Admitting: Pulmonary Disease

## 2018-04-29 VITALS — BP 150/92 | HR 109 | Ht 67.5 in | Wt 144.0 lb

## 2018-04-29 DIAGNOSIS — R9389 Abnormal findings on diagnostic imaging of other specified body structures: Secondary | ICD-10-CM

## 2018-04-29 NOTE — Progress Notes (Signed)
PULMONARY OFFICE FOLLOW UP NOTE  Requesting MD/Service: McLean-Scocuzza Date of initial consultation: 11/05/17 Reason for consultation: Abnormal CT chest  PT PROFILE: 67 y.o. female former smoker (light and intermittent) initially evaluated for abdominal complaints with CT abdomen which revealed RML nodule.  CT chest performed with findings as below.  Referred for evaluation of abnormal CT chest.  No respiratory or pulmonary symptoms  DATA: CT chest 10/31/17: Reviewed by me.  Mild progression of biapical pleuroparenchymal scarring. Minimal reticulonodular change within the right middle lobe, for which inflammatory/infectious process should be considered. Atypical infection should also be considered, particularly mycobacterium avium intracellulare. No suspicious pulmonary nodules. Calcified granulomata within the spleen  Ct chest 04/28/18: Decrease and  reticulonodular type opacities at the base of the right middle lobe. Residual opacities appear due to mildly dilated bronchi with secretions, again raising suspicion for atypical infection such as MAI. No suspicious pulmonary nodules  SUBJ:  This is a scheduled re-eval to review repeat CT chest.  She continues to deny all pulmonary / respiratory symptoms including CP, fever, purulent sputum, hemoptysis, LE edema and calf tenderness. Her weight has stabilized  OBJ:  Vitals:   04/29/18 0840 04/29/18 0844  BP:  (!) 150/92  Pulse:  (!) 109  SpO2:  99%  Weight: 144 lb (65.3 kg)   Height: 5' 7.5" (1.715 m)   RA  EXAM:  Gen: NAD HEENT: NCAT, sclera white Neck: No JVD Lungs: breath sounds full, no wheezes or other adventitious sounds Cardiovascular: RRR, no murmurs Abdomen: Soft, nontender, normal BS Ext: without clubbing, cyanosis, edema Neuro: grossly intact Skin: Limited exam, no lesions noted    DATA:   BMP Latest Ref Rng & Units 09/10/2017 07/15/2017 05/08/2016  Glucose 70 - 99 mg/dL 98 91 88  BUN 6 - 23 mg/dL 7 7(L) 7(L)   Creatinine 0.40 - 1.20 mg/dL 0.69 0.74 0.77  BUN/Creat Ratio 12 - 28 - 9(L) 9(L)  Sodium 135 - 145 mEq/L 140 143 138  Potassium 3.5 - 5.1 mEq/L 3.9 4.1 4.2  Chloride 96 - 112 mEq/L 104 105 99  CO2 19 - 32 mEq/L 30 27 23   Calcium 8.4 - 10.5 mg/dL 9.5 9.3 9.1    CBC Latest Ref Rng & Units 09/10/2017 07/15/2017 05/08/2016  WBC 4.0 - 10.5 K/uL 4.6 4.2 4.4  Hemoglobin 12.0 - 15.0 g/dL 13.2 13.3 13.2  Hematocrit 36.0 - 46.0 % 39.4 39.9 38.2  Platelets 150.0 - 400.0 K/uL 174.0 186 186    CXR 11/27/17: moderate kyphosis. No acute findings  IMPRESSION:   Abnormal CT chest -  There is no progression in CT findings. In fact, RML inflammatory findings appear improved. She remains asymptomatic. No further evaluation is warranted  PLAN:  We reviewed the current CT findings together in the office today. No further evaluation or repeat radiographic follow up is warranted.   Follow up as needed for any breathing or respiratory symptoms including shortness of breth, chest discomfort or pain or cough.   Merton Border, MD PCCM service Mobile [phone] Pager [phone] 04/29/2018 8:48 AM

## 2018-04-29 NOTE — Patient Instructions (Signed)
We reviewed the current CT findings together in the office today. No further evaluation or repeat radiographic follow up is warranted.   Follow up as needed for any breathing or respiratory symptoms including shortness of breth, chest discomfort or pain or cough.

## 2018-04-30 ENCOUNTER — Encounter: Payer: Self-pay | Admitting: Pulmonary Disease

## 2018-05-03 NOTE — Progress Notes (Signed)
Cardiology Office Note  Date:  05/05/2018   ID:  Meghan Mack, DOB 01/17/1951, MRN [id number]  PCP:  Burnard Hawthorne, FNP   Chief Complaint  Patient presents with  . OTHER    12 month f/u discuss CT scan. Meds reviewed verbally with pt.    HPI:  Meghan Mack is a very pleasant 67 year old woman with a history of  palpitations,  anxiety,  remote history of chest pain,  hyperlipidemia  Prior 30 day monitor in 2014 showed APCs and short runs of atrial tachycardia/SVT with rate 166 bpm hypertension  strong family history of coronary artery disease  who presents for routine followup  Of her palpitations and tachycardia.  Having more palpitations at nighttime Wonders if she can take more beta blocker Currently takes bystolic 2.5 mg in the morning Blood pressure stable  Denies any chest pain concerning for angina  CT chest reviewed with her in detail images pulled up in the office She has had mild coronary calcification of the LAD and RCA Also with diffuse mild aortic atherosclerosis  Reports her weight has been stable Tolerating simvastatin Wears compression hose  Lab work discussed total cholesterol 150, LDL 70  EKG personally reviewed by myself on todays visit shows normal sinus rhythm with rate 98 bpm, no significant ST or T-wave changes    PMH:   has a past medical history of Anemia, Colonic polyp, Constipation, COPD (chronic obstructive pulmonary disease) ([place]), Hyperlipidemia, IBS (irritable bowel syndrome), Kidney cysts, Lung nodule, Meningioma ([place]), Nausea, Palpitations, and Tonsillar cyst.  PSH:    Past Surgical History:  Procedure Laterality Date  . CARDIAC CATHETERIZATION  02/25/2007  . COLONOSCOPY    . ESOPHAGOGASTRODUODENOSCOPY  10/1997 & 08/2005  . FOOT SURGERY  04/25/2007   right  . THYROIDECTOMY  11/2005  . VAGINAL HYSTERECTOMY     ovaries intact    Current Outpatient Medications  Medication Sig Dispense Refill  . BYSTOLIC 5 MG tablet Take  1 tablet (5 mg total) by mouth 2 (two) times daily. 5 mg daily in Am with extra 5 mg in pm as needed 120 tablet 6  . ergocalciferol (VITAMIN D2) 50000 UNITS capsule Take 50,000 Units by mouth every 30 (thirty) days.    Marland Kitchen ibuprofen (ADVIL,MOTRIN) 600 MG tablet Take 1 tablet (600 mg total) by mouth every 8 (eight) hours as needed. 20 tablet 0  . levothyroxine (SYNTHROID, LEVOTHROID) 88 MCG tablet Take 1 tablet 6 days a week.    . Multiple Vitamin (MULTIVITAMIN PO) Take 1 tablet by mouth daily.      . ranitidine (ZANTAC) 150 MG tablet Take 150 mg by mouth as needed.      . simvastatin (ZOCOR) 20 MG tablet TAKE 1 TABLET (20 mg) EVERY EVENING (IMPROVES CHOLESTEROL) 30 tablet 6  . ezetimibe (ZETIA) 10 MG tablet Take 1 tablet (10 mg total) by mouth daily. 30 tablet 11   No current facility-administered medications for this visit.      Allergies:   Tdap [tetanus-diphth-acell pertussis]; Codeine; Diflucan [fluconazole]; Esomeprazole magnesium; Meperidine hcl; Pneumovax 23 [pneumococcal vac polyvalent]; Propantheline bromide; Propoxyphene n-acetaminophen; Sulfa antibiotics; and Sulfonamide derivatives   Social History:  The patient  reports that she quit smoking about 20 years ago. Her smoking use included cigarettes. She has a 2.50 pack-year smoking history. She has never used smokeless tobacco. She reports that she does not drink alcohol or use drugs.   Family History:   family history includes Cancer in her brother, father, maternal aunt,  maternal grandfather, and mother; Emphysema in her father; Heart disease in her father.    Review of Systems: Review of Systems  Constitutional: Negative.   Respiratory: Negative.   Cardiovascular: Negative.   Gastrointestinal: Negative.   Musculoskeletal: Negative.   Neurological: Negative.   Psychiatric/Behavioral: Negative.   All other systems reviewed and are negative.    PHYSICAL EXAM: VS:  BP 130/90 (BP Location: Left Arm, Patient Position: Sitting,  Cuff Size: Normal)   Pulse 98   Ht 5' 7.5" (1.715 m)   Wt 146 lb (66.2 kg)   BMI 22.53 kg/m  , BMI Body mass index is 22.53 kg/m. Constitutional:  oriented to person, place, and time. No distress.  HENT:  Head: Normocephalic and atraumatic.  Eyes:  no discharge. No scleral icterus.  Neck: Normal range of motion. Neck supple. No JVD present.  Cardiovascular: Normal rate, regular rhythm, normal heart sounds and intact distal pulses. Exam reveals no gallop and no friction rub. No edema No murmur heard. Pulmonary/Chest: Effort normal and breath sounds normal. No stridor. No respiratory distress.  no wheezes.  no rales.  no tenderness.  Abdominal: Soft.  no distension.  no tenderness.  Musculoskeletal: Normal range of motion.  no  tenderness or deformity.  Neurological:  normal muscle tone. Coordination normal. No atrophy Skin: Skin is warm and dry. No rash noted. not diaphoretic.  Psychiatric:  normal mood and affect. behavior is normal. Thought content normal.    Recent Labs: 07/15/2017: TSH 0.990 09/10/2017: ALT 18; BUN 7; Creatinine, Ser 0.69; Hemoglobin 13.2; Platelets 174.0; Potassium 3.9; Sodium 140    Lipid Panel Lab Results  Component Value Date   CHOL 154 07/15/2017   HDL 73 07/15/2017   LDLCALC 57 07/15/2017   TRIG 120 07/15/2017      Wt Readings from Last 3 Encounters:  05/05/18 146 lb (66.2 kg)  04/29/18 144 lb (65.3 kg)  01/01/18 145 lb 6.4 oz (66 kg)      ASSESSMENT AND PLAN:  Palpitations - Plan: EKG 12-Lead Still having symptoms on low-dose beta blocker Recommend she increase up to 5 mg in the morning May need additional 2.5 mg even up to 5 mg in the evening for palpitations overnight  Tachycardia - Plan: EKG 12-Lead Previous event monitor showing 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 We will continue beta blockers, light increase in dosing New prescription sent in   Obstructive sleep apnea - Plan: EKG 12-Lead  TOBACCO ABUSE -  Plan: EKG 12-Lead Prior history of smoking A current nonsmoker  Mixed hyperlipidemia - Plan: EKG 12-Lead Long discussion concerning her coronary calcification seen on CT scan Recommended she add Zetia  CAD mildcoronary calcification and mild to moderate diffuse aortic atherosclerosis Images reviewed with her Continue aggressive lipid management   Disposition:   F/U  12 months  Total encounter time more than 25 minutes  Greater than 50% was spent in counseling and coordination of care with the patient    Orders Placed This Encounter  Procedures  . EKG 12-Lead     Signed, Esmond Plants, M.D., Ph.D. 05/05/2018  [place], [place]

## 2018-05-05 ENCOUNTER — Encounter: Payer: Self-pay | Admitting: Cardiovascular Disease

## 2018-05-05 ENCOUNTER — Other Ambulatory Visit: Payer: Self-pay | Admitting: Cardiovascular Disease

## 2018-05-05 ENCOUNTER — Encounter

## 2018-05-05 ENCOUNTER — Ambulatory Visit: Payer: Medicare Other | Admitting: Cardiovascular Disease

## 2018-05-05 VITALS — BP 130/90 | HR 98 | Ht 67.5 in | Wt 146.0 lb

## 2018-05-05 DIAGNOSIS — G4733 Obstructive sleep apnea (adult) (pediatric): Secondary | ICD-10-CM | POA: Diagnosis not present

## 2018-05-05 DIAGNOSIS — F17200 Nicotine dependence, unspecified, uncomplicated: Secondary | ICD-10-CM | POA: Diagnosis not present

## 2018-05-05 DIAGNOSIS — I2510 Atherosclerotic heart disease of native coronary artery without angina pectoris: Secondary | ICD-10-CM | POA: Diagnosis not present

## 2018-05-05 DIAGNOSIS — E782 Mixed hyperlipidemia: Secondary | ICD-10-CM

## 2018-05-05 DIAGNOSIS — I471 Supraventricular tachycardia: Secondary | ICD-10-CM | POA: Diagnosis not present

## 2018-05-05 DIAGNOSIS — I700 Atherosclerosis of aorta: Secondary | ICD-10-CM

## 2018-05-05 MED ORDER — EZETIMIBE 10 MG PO TABS: 10.0000 mg | ORAL_TABLET | Freq: Every day | ORAL | 11 refills | Status: DC

## 2018-05-05 MED ORDER — BYSTOLIC 5 MG PO TABS: 5.0000 mg | ORAL_TABLET | Freq: Two times a day (BID) | ORAL | 6 refills | Status: DC

## 2018-05-05 NOTE — Telephone Encounter (Signed)
Can you please advise  Pharmacy is requesting,   INS WILL ONLY PAY AS 1 PER DAY--PLEASE CHANGE TO 10MG  AND TAKE 1/2 TAB BID.  Thanks !

## 2018-05-05 NOTE — Patient Instructions (Addendum)
Medication Instructions:   Please start zetia 10 mg daily  bystolic 5 mg in the morning, and 5 mg at PM as needed  Labwork:  No new labs needed  Testing/Procedures:  No further testing at this time   Follow-Up: It was a pleasure seeing you in the office today. Please call us if you have new issues that need to be addressed before your next appt.  [phone]  Your physician wants you to follow-up in: 12 months.  You will receive a reminder letter in the mail two months in advance. If you don't receive a letter, please call our office to schedule the follow-up appointment.  If you need a refill on your cardiac medications before your next appointment, please call your pharmacy.  For educational health videos Log in to : [email or web address] Or : [email or web address], password : [email or web address]

## 2018-05-07 MED ORDER — NEBIVOLOL HCL 5 MG PO TABS: 5.0000 mg | ORAL_TABLET | Freq: Every day | ORAL | 6 refills | Status: DC | PRN

## 2018-05-07 MED ORDER — NEBIVOLOL HCL 10 MG PO TABS: 5.0000 mg | ORAL_TABLET | Freq: Two times a day (BID) | ORAL | 3 refills | Status: DC

## 2018-05-07 NOTE — Telephone Encounter (Signed)
Spoke pharmacist. He said patient's insurance does not cover more than 2 tablets a day. He advised I could send in bystolic 10 mg, take 1/2 tablet by mouth two times a day and then bystolic 5 mg by mouth once a day in the evening as needed. He will let us know if this does not work.

## 2018-05-07 NOTE — Telephone Encounter (Signed)
S/w pharmacist. He

## 2018-05-07 NOTE — Addendum Note (Signed)
Addended by: Vanessa Ralphs on: 05/07/2018 04:37 PM   Modules accepted: Orders

## 2018-06-28 IMAGING — CR DG UGI W/ SMALL BOWEL HIGH DENSITY
5 series · 14 of 20 positions shown · non-contrast
Comparison: None.

CLINICAL DATA: Increased gas and very umbilical pain for the past 6
weeks. Symptoms are worse when lying on the right side and patient
reports the fullness makes it difficult for her [REDACTED]f.

EXAM:
UPPER GI SERIES WITH SMALL BOWEL FOLLOW-THROUGH using barium
FLUOROSCOPY TIME:  Fluoroscopy Time:  1 minutes, 48 seconds
Radiation Exposure Index (if provided by the fluoroscopic device):
7598 micro Gy per meter square
Number of Acquired Spot Images: 15
TECHNIQUE: Combined double contrast and single contrast upper GI series using
effervescent crystals, thick barium, and thin barium. A barium
tablet was administered. Subsequently, serial images of the small
bowel were obtained including spot views of the terminal ileum.

[Series 1: t abdomen supine · 0.14mm/px · 2 of 3 slices shown]
[im 1/3]
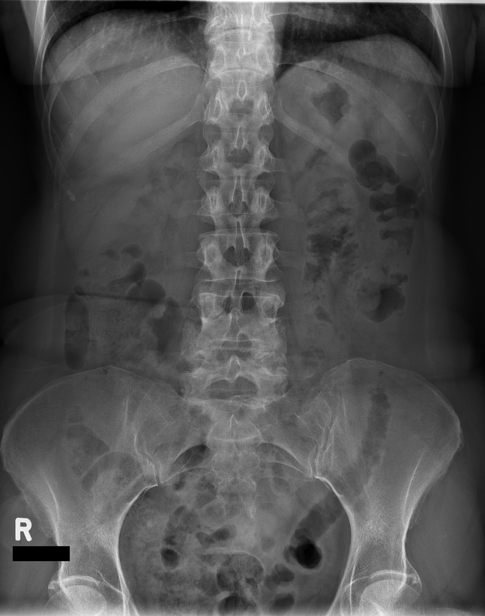
[im 3/3]
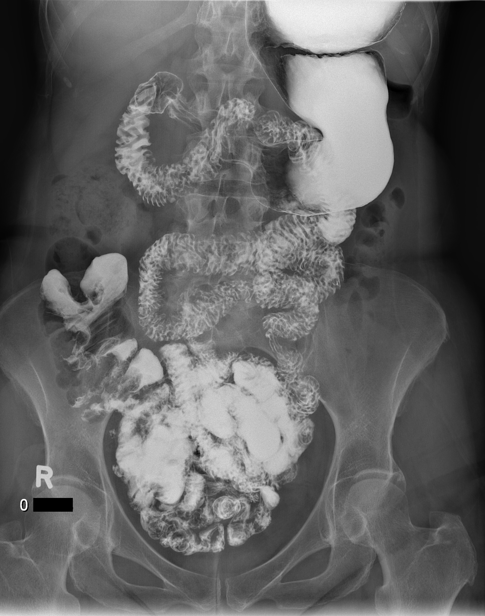

[fluoro_barium 2fps_bw (1 of 4)]
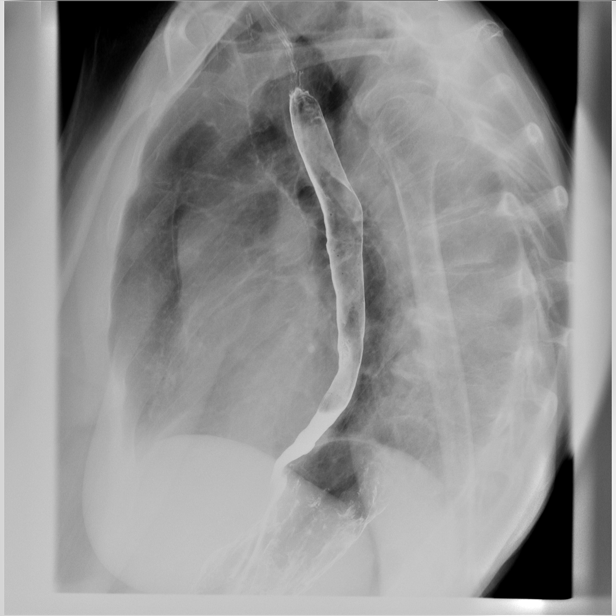

[fluoro_barium 2fps_bw (2 of 4)]
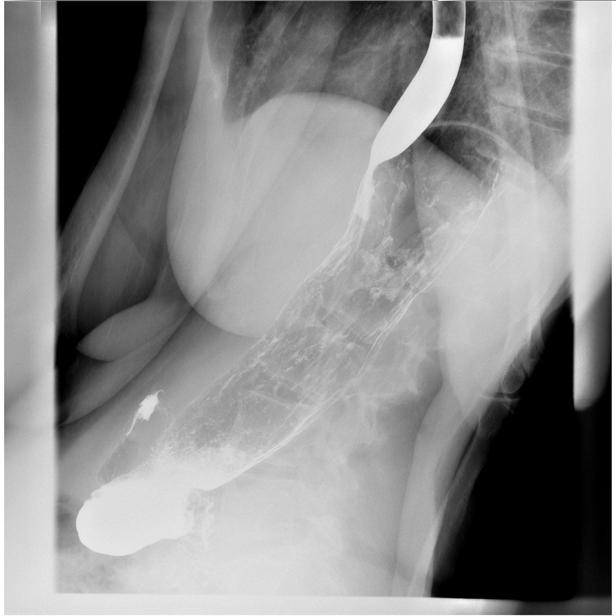

[Series 5: fluoro_barium 2fps_bw · 0.17mm/px · 9 of 12 slices shown (3 of 4)]
[im 1/12]
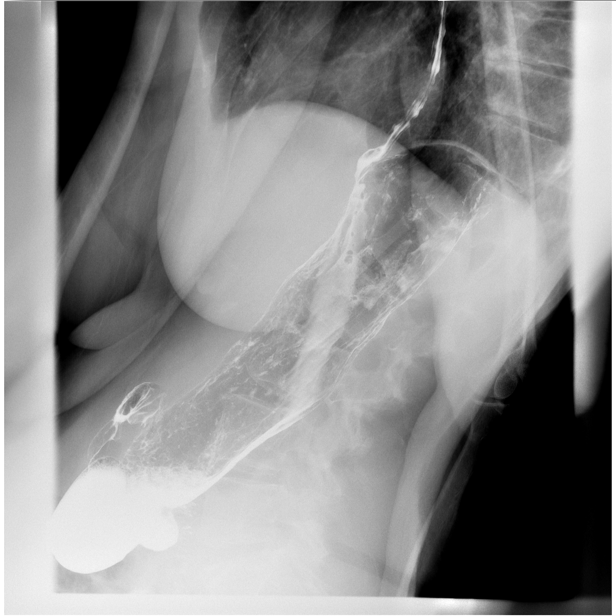
[im 2/12]
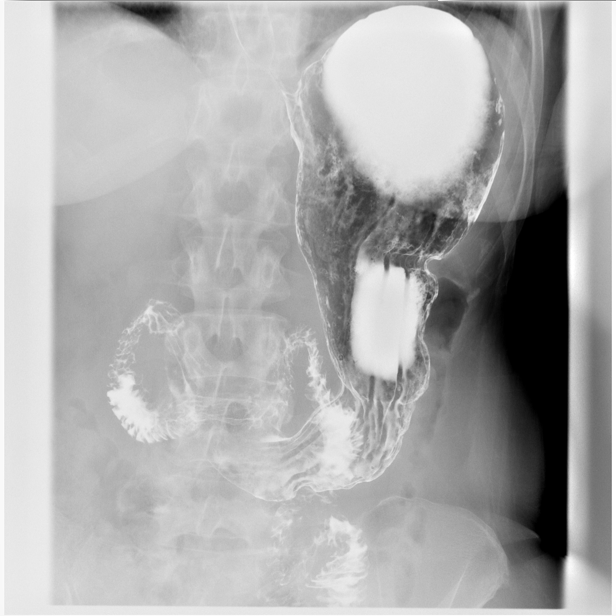
[im 4/12]
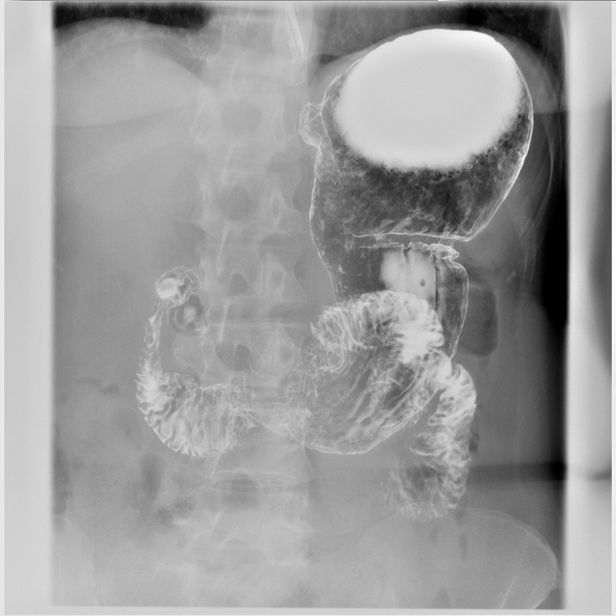
[im 5/12]
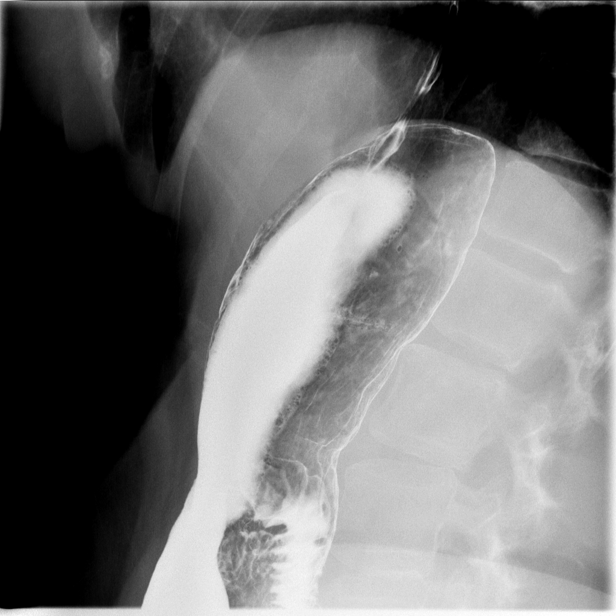
[im 7/12]
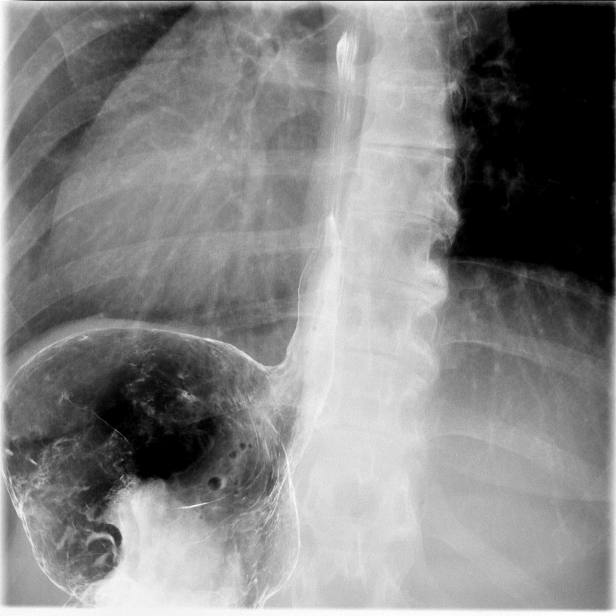
[im 8/12]
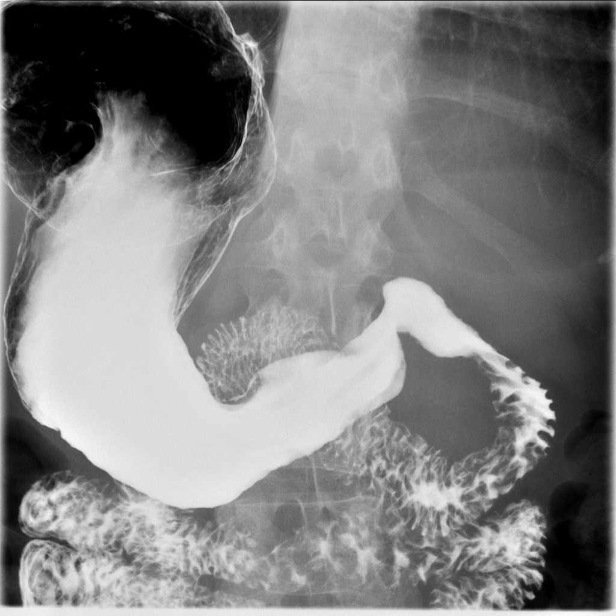
[im 10/12]
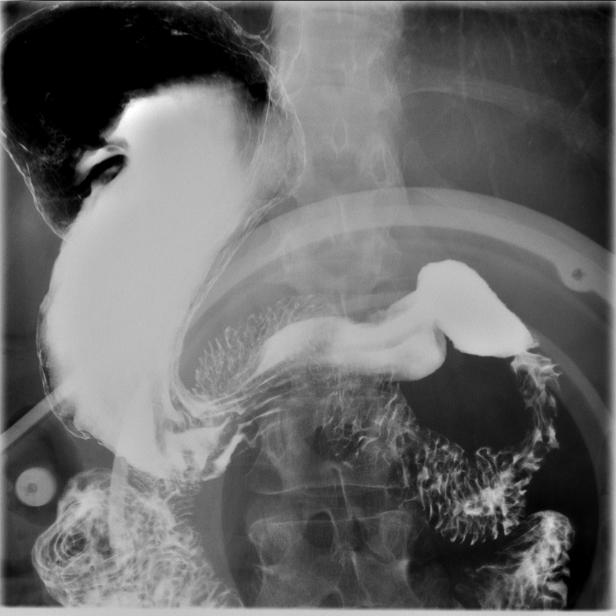
[im 11/12]
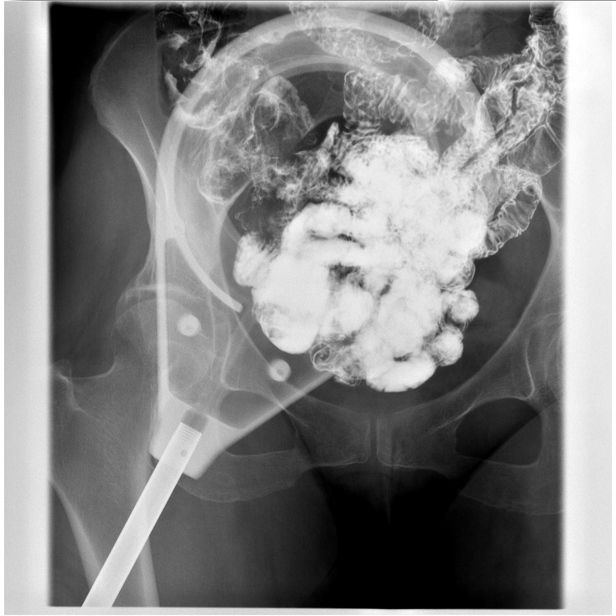
[im 12/12]
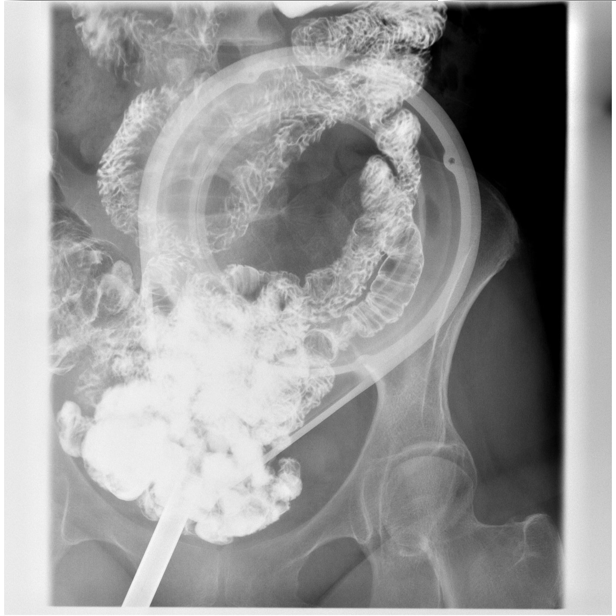

[fluoro_barium 2fps_bw (4 of 4)]
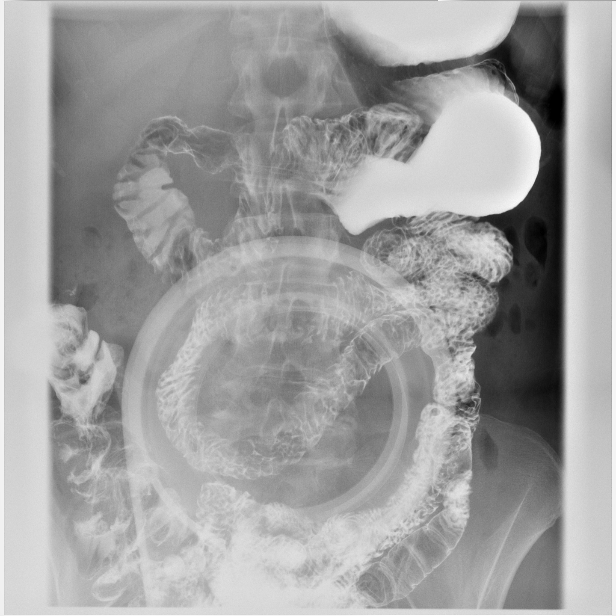

[14 of 20 positions shown; findings below may reference images not displayed]

FINDINGS: The scout film reveals a normal bowel gas pattern. No abnormal soft
tissue calcifications are observed. The patient ingested thick and
thin barium and the gas-forming crystals without difficulty. The
thoracic esophagus was normal in a survey fashion. There was no
hiatal hernia or gastroesophageal reflux. The stomach distended
well. The mucosal pattern was normal. The gastric wall was pliable
to direct palpation. Gastric emptying was prompt. The duodenal bulb
and Celsius sweep were normal in appearance. Over the course of
approximately 15 minutes the barium passed through the jejunum ileum
reaching the right colon. The jejunal and ileal loops exhibited
normal mucosal pattern. There is mild tenderness to palpation in the
periumbilical region and left mid abdomen. The terminal ileum
appeared normal.
IMPRESSION: Normal appearance of the stomach and duodenum.

Normal appearance of the jejunum and ileum.

Tenderness to palpation in the periumbilical region and the left mid
to lower abdomen was observed without fluoroscopic abnormality.
Given this tenderness and the patient's symptoms, abdominal and
pelvic CT scanning would be useful in an effort to exclude
inflammatory changes of the colon or mesentery.

## 2018-07-09 ENCOUNTER — Ambulatory Visit (INDEPENDENT_AMBULATORY_CARE_PROVIDER_SITE_OTHER): Payer: Medicare Other | Admitting: Family

## 2018-07-09 ENCOUNTER — Encounter: Payer: Self-pay | Admitting: Family

## 2018-07-09 VITALS — BP 125/80 | HR 88 | Temp 98.3°F | Resp 16 | Ht 67.0 in | Wt 145.0 lb

## 2018-07-09 DIAGNOSIS — I471 Supraventricular tachycardia: Secondary | ICD-10-CM

## 2018-07-09 DIAGNOSIS — Z0000 Encounter for general adult medical examination without abnormal findings: Secondary | ICD-10-CM | POA: Diagnosis not present

## 2018-07-09 DIAGNOSIS — W57XXXA Bitten or stung by nonvenomous insect and other nonvenomous arthropods, initial encounter: Secondary | ICD-10-CM | POA: Insufficient documentation

## 2018-07-09 DIAGNOSIS — R300 Dysuria: Secondary | ICD-10-CM | POA: Diagnosis not present

## 2018-07-09 DIAGNOSIS — S70361A Insect bite (nonvenomous), right thigh, initial encounter: Secondary | ICD-10-CM | POA: Diagnosis not present

## 2018-07-09 DIAGNOSIS — T8090XA Unspecified complication following infusion and therapeutic injection, initial encounter: Secondary | ICD-10-CM

## 2018-07-09 LAB — CBC WITH DIFFERENTIAL/PLATELET
Basophils Absolute: 0 10*3/uL (ref 0.0–0.1)
Basophils Relative: 0.4 % (ref 0.0–3.0)
Eosinophils Absolute: 0.1 10*3/uL (ref 0.0–0.7)
Eosinophils Relative: 1.5 % (ref 0.0–5.0)
HCT: 40.4 % (ref 36.0–46.0)
Hemoglobin: 13.8 g/dL (ref 12.0–15.0)
Lymphocytes Relative: 28.1 % (ref 12.0–46.0)
Lymphs Abs: 1 10*3/uL (ref 0.7–4.0)
MCHC: 34.1 g/dL (ref 30.0–36.0)
MCV: 96.9 fl (ref 78.0–100.0)
Monocytes Absolute: 0.3 10*3/uL (ref 0.1–1.0)
Monocytes Relative: 8.6 % (ref 3.0–12.0)
Neutro Abs: 2.2 10*3/uL (ref 1.4–7.7)
Neutrophils Relative %: 61.4 % (ref 43.0–77.0)
Platelets: 177 10*3/uL (ref 150.0–400.0)
RBC: 4.17 Mil/uL (ref 3.87–5.11)
RDW: 12.4 % (ref 11.5–15.5)
WBC: 3.6 10*3/uL — ABNORMAL LOW (ref 4.0–10.5)

## 2018-07-09 LAB — COMPREHENSIVE METABOLIC PANEL
ALT: 22 U/L (ref 0–35)
AST: 22 U/L (ref 0–37)
Albumin: 4.3 g/dL (ref 3.5–5.2)
Alkaline Phosphatase: 101 U/L (ref 39–117)
BUN: 9 mg/dL (ref 6–23)
CO2: 28 mEq/L (ref 19–32)
Calcium: 9.4 mg/dL (ref 8.4–10.5)
Chloride: 104 mEq/L (ref 96–112)
Creatinine, Ser: 0.71 mg/dL (ref 0.40–1.20)
GFR: 86.91 mL/min (ref >60.00)
Glucose, Bld: 100 mg/dL — ABNORMAL HIGH (ref 70–99)
Potassium: 4.1 mEq/L (ref 3.5–5.1)
Sodium: 140 mEq/L (ref 135–145)
Total Bilirubin: 0.6 mg/dL (ref 0.2–1.2)
Total Protein: 7.3 g/dL (ref 6.0–8.3)

## 2018-07-09 LAB — POC URINALSYSI DIPSTICK (AUTOMATED)
Bilirubin, UA: NEGATIVE
Blood, UA: NEGATIVE
Glucose, UA: NEGATIVE
Ketones, UA: NEGATIVE
Leukocytes, UA: NEGATIVE
Nitrite, UA: NEGATIVE
Protein, UA: NEGATIVE
Spec Grav, UA: 1.02 (ref 1.010–1.025)
Urobilinogen, UA: 0.2 E.U./dL
pH, UA: 5.5 (ref 5.0–8.0)

## 2018-07-09 LAB — URINALYSIS, MICROSCOPIC ONLY

## 2018-07-09 LAB — LIPID PANEL
Cholesterol: 156 mg/dL (ref 0–200)
HDL: 73.3 mg/dL (ref >39.00)
LDL Cholesterol: 68 mg/dL (ref 0–99)
NonHDL: 82.77
Total CHOL/HDL Ratio: 2
Triglycerides: 75 mg/dL (ref 0.0–149.0)
VLDL: 15 mg/dL (ref 0.0–40.0)

## 2018-07-09 LAB — TSH: TSH: 2.76 u[IU]/mL (ref 0.35–4.50)

## 2018-07-09 LAB — VITAMIN D 25 HYDROXY (VIT D DEFICIENCY, FRACTURES): VITD: 48.98 ng/mL (ref 30.00–100.00)

## 2018-07-09 NOTE — Progress Notes (Signed)
Subjective:    Patient ID: Meghan Mack, female    DOB: [birth year], 67 y.o.   MRN: [id number]  CC: Meghan Mack is a 67 y.o. female who presents today for physical exam.    HPI: 4 days ago had flu shot in left arm, resolved. Has had reactions such as redness, localized in the past to pneumococcal in past. No SOB, trouble breathing.   Complains of dysuria 2 days, unchanged. No fever, chills, abdominal pain, low back pain, N< v, hematuria. No medications for this.   Treated for UTI with GYN couple of months ago.   Bug bite on left thigh 10 days, improved. Thinks spider as was moving boxes. No fever, arthralgia. Itching now. No purulent discharge. Applying polysporin.   HTN- compliant with medication. Follows with [facility]. Increased bystolic to 5mg  qam during last office visit.   Stress and caffeine increase palpitations. Denies exertional chest pain or pressure, numbness or tingling radiating to left arm or jaw, dizziness, frequent headaches, changes in vision, or shortness of breath.   HLD- plans to start zetia if total cholesterol not less than last year as advised by [facility];  on zocor.   Hypothyroidism s/p thyroidectomy.    CT chest 04/2018 improvement of opacities; has seen Dr Alva Garnet 04/2018 and advised no further follow up.   Colorectal Cancer Screening: UTD , due next year.  Breast Cancer Screening: Mammogram due; scheduled Cervical Cancer Screening: Follows with GYN. States she will have  Bone Health screening/DEXA for 65+: Done 2019. Osteopenia.  Lung Cancer Screening: Doesn't have 30 year pack year history and age > 95 years. Quit  more than 15 years ago.   Immunizations       Tetanus - utd        Pneumococcal - prevnar at 55. Due for pneumovax  Labs: Screening labs today. Exercise: Gets regular exercise. Walks daily, approx 1.5 miles.  Alcohol use: none Smoking/tobacco use: Former smoker.  Regular dental exams: UTD Wears seat belt: Yes. Skin: follows with Dr  [name]  HISTORY:  Past Medical History:  Diagnosis Date  . Anemia   . Colonic polyp   . Constipation   . COPD (chronic obstructive pulmonary disease) ([place])   . Hyperlipidemia   . IBS (irritable bowel syndrome)   . Kidney cysts   . Lung nodule   . Meningioma ([place])   . Nausea   . Palpitations   . Tonsillar cyst     Past Surgical History:  Procedure Laterality Date  . CARDIAC CATHETERIZATION  02/25/2007  . COLONOSCOPY    . ESOPHAGOGASTRODUODENOSCOPY  10/1997 & 08/2005  . FOOT SURGERY  04/25/2007   right  . THYROIDECTOMY  11/2005  . VAGINAL HYSTERECTOMY     ovaries intact   Family History  Problem Relation Age of Onset  . Cancer Mother        colon  . Emphysema Father   . Heart disease Father   . Cancer Father        leukemia  . Cancer Brother        lung  . Cancer Maternal Aunt        melanoma  . Cancer Maternal Grandfather        stomach, colon  . Breast cancer Neg Hx   . Kidney cancer Neg Hx   . Kidney disease Neg Hx   . Prostate cancer Neg Hx       ALLERGIES: Tdap [tetanus-diphth-acell pertussis]; Codeine; Diflucan [fluconazole]; Esomeprazole magnesium; Meperidine hcl;  Pneumovax 23 [pneumococcal vac polyvalent]; Propantheline bromide; Propoxyphene n-acetaminophen; Sulfa antibiotics; and Sulfonamide derivatives  Current Outpatient Medications on File Prior to Visit  Medication Sig Dispense Refill  . dexamethasone (DECADRON) 0.5 MG/5ML solution Take by mouth daily.    . ergocalciferol (VITAMIN D2) 50000 UNITS capsule Take 50,000 Units by mouth every 30 (thirty) days.    Marland Kitchen ibuprofen (ADVIL,MOTRIN) 600 MG tablet Take 1 tablet (600 mg total) by mouth every 8 (eight) hours as needed. 20 tablet 0  . levothyroxine (SYNTHROID, LEVOTHROID) 88 MCG tablet Take 1 tablet 6 days a week.    . Multiple Vitamin (MULTIVITAMIN PO) Take 1 tablet by mouth daily.      . nebivolol (BYSTOLIC) 10 MG tablet Take 0.5 tablets (5 mg total) by mouth 2 (two) times daily. 90 tablet 3  .  nebivolol (BYSTOLIC) 5 MG tablet Take 1 tablet (5 mg total) by mouth daily as needed. In the evening with evening dose of Bystolic. 30 tablet 6  . neomycin-polymyxin b-dexamethasone (MAXITROL) 3.5-10000-0.1 SUSP INSTILL 1 DROP IN RIGHT EYE 4 TIMES A DAY FOR 7 DAYS THEN 1 DROP TWICE A DAY FOR 7 DAYS  0  . ranitidine (ZANTAC) 150 MG tablet Take 150 mg by mouth as needed.      . simvastatin (ZOCOR) 20 MG tablet TAKE 1 TABLET (20 mg) EVERY EVENING (IMPROVES CHOLESTEROL) 30 tablet 6   No current facility-administered medications on file prior to visit.     Social History   Tobacco Use  . Smoking status: Former Smoker    Packs/day: 0.25    Years: 10.00    Pack years: 2.50    Types: Cigarettes    Last attempt to quit: 09/25/1997    Years since quitting: 20.8  . Smokeless tobacco: Never Used  Substance Use Topics  . Alcohol use: No    Alcohol/week: 1.0 standard drinks    Types: 1 Standard drinks or equivalent per week  . Drug use: No    Review of Systems  Constitutional: Negative for chills, fever and unexpected weight change.  HENT: Negative for congestion.   Respiratory: Negative for cough and shortness of breath.   Cardiovascular: Positive for palpitations. Negative for chest pain and leg swelling.  Gastrointestinal: Negative for nausea and vomiting.  Genitourinary: Positive for dysuria. Negative for frequency and vaginal bleeding.  Musculoskeletal: Negative for arthralgias and myalgias.  Skin: Positive for color change. Negative for rash and wound.  Neurological: Negative for headaches.  Hematological: Negative for adenopathy.  Psychiatric/Behavioral: Negative for confusion.      Objective:    BP 125/80   Pulse 88   Temp 98.3 F (36.8 C) (Oral)   Resp 16   Ht 5\' 7"  (1.702 m)   Wt 145 lb (65.8 kg)   SpO2 98%   BMI 22.71 kg/m   BP Readings from Last 3 Encounters:  07/09/18 125/80  05/05/18 130/90  04/29/18 (!) 150/92   Wt Readings from Last 3 Encounters:  07/09/18  145 lb (65.8 kg)  05/05/18 146 lb (66.2 kg)  04/29/18 144 lb (65.3 kg)    Physical Exam  Constitutional: She appears well-developed and well-nourished.  Eyes: Conjunctivae are normal.  Neck:  Thyroid absent  Cardiovascular: Normal rate, regular rhythm, normal heart sounds and normal pulses.  Pulmonary/Chest: Effort normal and breath sounds normal. She has no wheezes. She has no rhonchi. She has no rales. Right breast exhibits no inverted nipple, no mass, no nipple discharge, no skin change and no tenderness. Left  breast exhibits no inverted nipple, no mass, no nipple discharge, no skin change and no tenderness. Breasts are symmetrical.  CBE performed.   Abdominal: There is no CVA tenderness.  Lymphadenopathy:       Head (right side): No submental, no submandibular, no tonsillar, no preauricular, no posterior auricular and no occipital adenopathy present.       Head (left side): No submental, no submandibular, no tonsillar, no preauricular, no posterior auricular and no occipital adenopathy present.    She has no cervical adenopathy.       Right cervical: No superficial cervical, no deep cervical and no posterior cervical adenopathy present.      Left cervical: No superficial cervical, no deep cervical and no posterior cervical adenopathy present.    She has no axillary adenopathy.  Neurological: She is alert.  Skin: Skin is warm and dry.     No rash, erythema, increased warmth, or edema noted on right upper arm. Nontender   Localized erythema approx 3cm. No increased warmth, papule, purulent discharge, streaking. Nontender   Psychiatric: She has a normal mood and affect. Her speech is normal and behavior is normal. Thought content normal.  Vitals reviewed.      Assessment & Plan:   Problem List Items Addressed This Visit      Cardiovascular and Mediastinum   Paroxysmal supraventricular tachycardia (HCC)    Chronic palpitations. BP well controlled today. No changes to  medication. Advised continued follow up with [facility] .         Other   Routine physical examination - Primary    CBE performed. Deferred pap in the absence of complaints and follows with gyn. She will return for pneumo 23      Relevant Orders   Comprehensive metabolic panel   Lipid panel   TSH   VITAMIN D 25 Hydroxy (Vit-D Deficiency, Fractures)   CBC with Differential/Platelet   Dysuria    Afebrile. Pending urine culture      Relevant Orders   POCT Urinalysis Dipstick (Automated) (Completed)   Urine Microscopic   CULTURE, URINE COMPREHENSIVE   Injection site reaction    Resolved.       Bug bite    Improved. No signs of infection. Patient will continue to closely watch and let me know if doesn't resolve.           I have discontinued Meghan Mack. Pacella's ezetimibe. I am also having her maintain her Multiple Vitamin (MULTIVITAMIN PO), levothyroxine, ranitidine, ergocalciferol, ibuprofen, simvastatin, nebivolol, nebivolol, dexamethasone, and neomycin-polymyxin b-dexamethasone.   No orders of the defined types were placed in this encounter.   Return precautions given.   Risks, benefits, and alternatives of the medications and treatment plan prescribed today were discussed, and patient expressed understanding.   Education regarding symptom management and diagnosis given to patient on AVS.   Continue to follow with Burnard Hawthorne, FNP for routine health maintenance.   Luberta Robertson and I agreed with plan.   Mable Paris, FNP

## 2018-07-09 NOTE — Assessment & Plan Note (Signed)
CBE performed. Deferred pap in the absence of complaints and follows with gyn. She will return for pneumo 23

## 2018-07-09 NOTE — Assessment & Plan Note (Signed)
Afebrile. Pending urine culture

## 2018-07-09 NOTE — Assessment & Plan Note (Addendum)
Chronic palpitations. BP well controlled today. No changes to medication. Advised continued follow up with [facility] .

## 2018-07-09 NOTE — Assessment & Plan Note (Signed)
Improved. No signs of infection. Patient will continue to closely watch and let me know if doesn't resolve.

## 2018-07-09 NOTE — Assessment & Plan Note (Signed)
Resolved

## 2018-07-09 NOTE — Patient Instructions (Addendum)
Come back for pneumococcal 23 when you can.   Continue to watch the bug bite on your leg, if doesn't continue to improve.    Health Maintenance for Postmenopausal Women Menopause is a normal process in which your reproductive ability comes to an end. This process happens gradually over a span of months to years, usually between the ages of 54 and 32. Menopause is complete when you have missed 12 consecutive menstrual periods. It is important to talk with your health care provider about some of the most common conditions that affect postmenopausal women, such as heart disease, cancer, and bone loss (osteoporosis). Adopting a healthy lifestyle and getting preventive care can help to promote your health and wellness. Those actions can also lower your chances of developing some of these common conditions. What should I know about menopause? During menopause, you may experience a number of symptoms, such as:  Moderate-to-severe hot flashes.  Night sweats.  Decrease in sex drive.  Mood swings.  Headaches.  Tiredness.  Irritability.  Memory problems.  Insomnia.  Choosing to treat or not to treat menopausal changes is an individual decision that you make with your health care provider. What should I know about hormone replacement therapy and supplements? Hormone therapy products are effective for treating symptoms that are associated with menopause, such as hot flashes and night sweats. Hormone replacement carries certain risks, especially as you become older. If you are thinking about using estrogen or estrogen with progestin treatments, discuss the benefits and risks with your health care provider. What should I know about heart disease and stroke? Heart disease, heart attack, and stroke become more likely as you age. This may be due, in part, to the hormonal changes that your body experiences during menopause. These can affect how your body processes dietary fats, triglycerides, and  cholesterol. Heart attack and stroke are both medical emergencies. There are many things that you can do to help prevent heart disease and stroke:  Have your blood pressure checked at least every 1-2 years. High blood pressure causes heart disease and increases the risk of stroke.  If you are 69-58 years old, ask your health care provider if you should take aspirin to prevent a heart attack or a stroke.  Do not use any tobacco products, including cigarettes, chewing tobacco, or electronic cigarettes. If you need help quitting, ask your health care provider.  It is important to eat a healthy diet and maintain a healthy weight. ? Be sure to include plenty of vegetables, fruits, low-fat dairy products, and lean protein. ? Avoid eating foods that are high in solid fats, added sugars, or salt (sodium).  Get regular exercise. This is one of the most important things that you can do for your health. ? Try to exercise for at least 150 minutes each week. The type of exercise that you do should increase your heart rate and make you sweat. This is known as moderate-intensity exercise. ? Try to do strengthening exercises at least twice each week. Do these in addition to the moderate-intensity exercise.  Know your numbers.Ask your health care provider to check your cholesterol and your blood glucose. Continue to have your blood tested as directed by your health care provider.  What should I know about cancer screening? There are several types of cancer. Take the following steps to reduce your risk and to catch any cancer development as early as possible. Breast Cancer  Practice breast self-awareness. ? This means understanding how your breasts normally appear and  feel. ? It also means doing regular breast self-exams. Let your health care provider know about any changes, no matter how small.  If you are 52 or older, have a clinician do a breast exam (clinical breast exam or CBE) every year. Depending  on your age, family history, and medical history, it may be recommended that you also have a yearly breast X-ray (mammogram).  If you have a family history of breast cancer, talk with your health care provider about genetic screening.  If you are at high risk for breast cancer, talk with your health care provider about having an MRI and a mammogram every year.  Breast cancer (BRCA) gene test is recommended for women who have family members with BRCA-related cancers. Results of the assessment will determine the need for genetic counseling and BRCA1 and for BRCA2 testing. BRCA-related cancers include these types: ? Breast. This occurs in males or females. ? Ovarian. ? Tubal. This may also be called fallopian tube cancer. ? Cancer of the abdominal or pelvic lining (peritoneal cancer). ? Prostate. ? Pancreatic.  Cervical, Uterine, and Ovarian Cancer Your health care provider may recommend that you be screened regularly for cancer of the pelvic organs. These include your ovaries, uterus, and vagina. This screening involves a pelvic exam, which includes checking for microscopic changes to the surface of your cervix (Pap test).  For women ages 21-65, health care providers may recommend a pelvic exam and a Pap test every three years. For women ages 29-65, they may recommend the Pap test and pelvic exam, combined with testing for human papilloma virus (HPV), every five years. Some types of HPV increase your risk of cervical cancer. Testing for HPV may also be done on women of any age who have unclear Pap test results.  Other health care providers may not recommend any screening for nonpregnant women who are considered low risk for pelvic cancer and have no symptoms. Ask your health care provider if a screening pelvic exam is right for you.  If you have had past treatment for cervical cancer or a condition that could lead to cancer, you need Pap tests and screening for cancer for at least 20 years after  your treatment. If Pap tests have been discontinued for you, your risk factors (such as having a new sexual partner) need to be reassessed to determine if you should start having screenings again. Some women have medical problems that increase the chance of getting cervical cancer. In these cases, your health care provider may recommend that you have screening and Pap tests more often.  If you have a family history of uterine cancer or ovarian cancer, talk with your health care provider about genetic screening.  If you have vaginal bleeding after reaching menopause, tell your health care provider.  There are currently no reliable tests available to screen for ovarian cancer.  Lung Cancer Lung cancer screening is recommended for adults 19-41 years old who are at high risk for lung cancer because of a history of smoking. A yearly low-dose CT scan of the lungs is recommended if you:  Currently smoke.  Have a history of at least 30 pack-years of smoking and you currently smoke or have quit within the past 15 years. A pack-year is smoking an average of one pack of cigarettes per day for one year.  Yearly screening should:  Continue until it has been 15 years since you quit.  Stop if you develop a health problem that would prevent you from having  lung cancer treatment.  Colorectal Cancer  This type of cancer can be detected and can often be prevented.  Routine colorectal cancer screening usually begins at age 18 and continues through age 54.  If you have risk factors for colon cancer, your health care provider may recommend that you be screened at an earlier age.  If you have a family history of colorectal cancer, talk with your health care provider about genetic screening.  Your health care provider may also recommend using home test kits to check for hidden blood in your stool.  A small camera at the end of a tube can be used to examine your colon directly (sigmoidoscopy or colonoscopy).  This is done to check for the earliest forms of colorectal cancer.  Direct examination of the colon should be repeated every 5-10 years until age 22. However, if early forms of precancerous polyps or small growths are found or if you have a family history or genetic risk for colorectal cancer, you may need to be screened more often.  Skin Cancer  Check your skin from head to toe regularly.  Monitor any moles. Be sure to tell your health care provider: ? About any new moles or changes in moles, especially if there is a change in a mole's shape or color. ? If you have a mole that is larger than the size of a pencil eraser.  If any of your family members has a history of skin cancer, especially at a young age, talk with your health care provider about genetic screening.  Always use sunscreen. Apply sunscreen liberally and repeatedly throughout the day.  Whenever you are outside, protect yourself by wearing long sleeves, pants, a wide-brimmed hat, and sunglasses.  What should I know about osteoporosis? Osteoporosis is a condition in which bone destruction happens more quickly than new bone creation. After menopause, you may be at an increased risk for osteoporosis. To help prevent osteoporosis or the bone fractures that can happen because of osteoporosis, the following is recommended:  If you are 40-58 years old, get at least 1,000 mg of calcium and at least 600 mg of vitamin D per day.  If you are older than age 78 but younger than age 66, get at least 1,200 mg of calcium and at least 600 mg of vitamin D per day.  If you are older than age 24, get at least 1,200 mg of calcium and at least 800 mg of vitamin D per day.  Smoking and excessive alcohol intake increase the risk of osteoporosis. Eat foods that are rich in calcium and vitamin D, and do weight-bearing exercises several times each week as directed by your health care provider. What should I know about how menopause affects my mental  health? Depression may occur at any age, but it is more common as you become older. Common symptoms of depression include:  Low or sad mood.  Changes in sleep patterns.  Changes in appetite or eating patterns.  Feeling an overall lack of motivation or enjoyment of activities that you previously enjoyed.  Frequent crying spells.  Talk with your health care provider if you think that you are experiencing depression. What should I know about immunizations? It is important that you get and maintain your immunizations. These include:  Tetanus, diphtheria, and pertussis (Tdap) booster vaccine.  Influenza every year before the flu season begins.  Pneumonia vaccine.  Shingles vaccine.  Your health care provider may also recommend other immunizations. This information is not intended  to replace advice given to you by your health care provider. Make sure you discuss any questions you have with your health care provider. Document Released: 11/23/2005 Document Revised: 04/20/2016 Document Reviewed: 07/05/2015 Elsevier Interactive Patient Education  2018 [employer].

## 2018-07-10 LAB — CULTURE, URINE COMPREHENSIVE
MICRO NUMBER:: 91152563
SPECIMEN QUALITY:: ADEQUATE

## 2018-07-11 ENCOUNTER — Other Ambulatory Visit: Payer: Self-pay | Admitting: Obstetrics and Gynecology

## 2018-07-11 ENCOUNTER — Telehealth: Payer: Self-pay | Admitting: Cardiovascular Disease

## 2018-07-11 ENCOUNTER — Other Ambulatory Visit: Payer: Self-pay | Admitting: Family

## 2018-07-11 DIAGNOSIS — Z1231 Encounter for screening mammogram for malignant neoplasm of breast: Secondary | ICD-10-CM

## 2018-07-11 NOTE — Telephone Encounter (Signed)
Pt c/o medication issue:  1. Name of Medication: Simvastatin   2. How are you currently taking this medication (dosage and times per day)? 20 mg every night   3. Are you having a reaction (difficulty breathing--STAT)? No   4. What is your medication issue? Just wanted to ask about how Dr Rockey Situ wanted to added the Zetia to help Cholestrol. She is wondering if we changed the whole medication instead of taking two would this help bring her Cholesterol down if she did a one pill    Please advise

## 2018-07-11 NOTE — Telephone Encounter (Signed)
Pt has additional questions regarding her statin. She wonders if she could just increase her statin vs adding zetia. She would like to talk with Dr Rockey Situ to discuss the benefits of adding zetia vs increasing her statin. I told pt I would forward her request to Dr Rockey Situ and his RN for a return call next week.

## 2018-07-12 ENCOUNTER — Other Ambulatory Visit: Payer: Self-pay | Admitting: Cardiovascular Disease

## 2018-07-15 NOTE — Telephone Encounter (Signed)
Okay to add Zetia to her simvastatin if she would like Alternatively I can talk to her about this and follow-up

## 2018-07-15 NOTE — Telephone Encounter (Signed)
Spoke with patient and she wanted to review her cholesterol medications. She is going to start the zetia and just wanted to ask some questions regarding the two medications. Reviewed with her in detail and she has appointment to see her PCP in December and will see if they can recheck her numbers at that time. She verbalized understanding of our conversation, agreement with plan, and had no further questions.

## 2018-07-16 MED ORDER — EZETIMIBE 10 MG PO TABS: 10.0000 mg | ORAL_TABLET | Freq: Every day | ORAL | 3 refills | Status: DC

## 2018-07-16 NOTE — Telephone Encounter (Signed)
Will add her zetia back to her list. She stated that provider gave her paper prescription to fill if she decided to take it.

## 2018-07-16 NOTE — Addendum Note (Signed)
Addended by: Valora Corporal on: 07/16/2018 07:50 AM   Modules accepted: Orders

## 2018-09-23 ENCOUNTER — Ambulatory Visit: Payer: Medicare Other | Admitting: Family Medicine

## 2018-09-23 ENCOUNTER — Encounter: Payer: Self-pay | Admitting: Family Medicine

## 2018-09-23 VITALS — BP 132/90 | HR 98 | Temp 98.4°F | Ht 67.0 in | Wt 149.0 lb

## 2018-09-23 DIAGNOSIS — J019 Acute sinusitis, unspecified: Secondary | ICD-10-CM | POA: Diagnosis not present

## 2018-09-23 MED ORDER — AZITHROMYCIN 250 MG PO TABS: ORAL_TABLET | ORAL | 0 refills | Status: DC

## 2018-09-23 NOTE — Progress Notes (Signed)
Subjective:    Patient ID: Meghan Mack, female    DOB: [birth year], 67 y.o.   MRN: [id number]  HPI  Presents to clinic sinus congestion, thick yellow sinus drainage, postnasal drip, sinus headache for past 2 weeks.  Patient states she has been trying taking over-the-counter Mucinex without much help and symptom relief.  Patient denies fever or chills.  Denies nausea, vomiting or diarrhea.  Denies shortness of breath or wheezing.  Patient Active Problem List   Diagnosis Date Noted  . Dysuria 07/09/2018  . Injection site reaction 07/09/2018  . Bug bite 07/09/2018  . Coronary artery calcification seen on CT scan 05/05/2018  . Aortic atherosclerosis ([place]) 05/05/2018  . Hematuria 10/21/2017  . Abnormal weight loss 10/21/2017  . Right hip pain 07/08/2017  . Snoring 08/07/2016  . Increased intraocular pressure 08/07/2016  . Routine physical examination 05/16/2016  . Medicare annual wellness visit, initial 06/27/2015  . Meningioma ([place]) 06/27/2015  . Breast nodule 10/19/2014  . Right knee pain 08/11/2012  . Screening for osteoporosis 08/11/2012  . Menopausal hot flushes 08/11/2012  . Tinnitus of left ear 01/23/2012  . Anxiety 01/23/2012  . Gallbladder polyp versus tiny stones on ultrasound 08/08/2011  . Tachycardia 02/24/2010  . Hyperlipidemia 08/19/2009  . Osteopenia 08/19/2009  . Obstructive sleep apnea 08/16/2008  . 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 VITAMIN D DEFICIENCY 04/14/2008  . DEGENERATIVE JOINT DISEASE, KNEE 04/14/2008  . HIP PAIN, RIGHT 10/07/2007  . Hypothyroidism 08/13/2007  . FIBROCYSTIC BREAST DISEASE 08/13/2007  . [place] SYNDROME 08/13/2007  . COLONIC POLYPS, HX OF 08/13/2007  . Paroxysmal supraventricular tachycardia ([place]) 03/18/2007   Social History   Tobacco Use  . Smoking status: Former Smoker    Packs/day: 0.25    Years: 10.00    Pack years: 2.50    Types: Cigarettes    Last attempt to quit: 09/25/1997    Years since quitting: 21.0  . Smokeless  tobacco: Never Used  Substance Use Topics  . Alcohol use: No    Alcohol/week: 1.0 standard drinks    Types: 1 Standard drinks or equivalent per week   Review of Systems  Constitutional: Negative for chills, fatigue and fever.  HENT: +congestion, ear pain, sinus pain/drainage.   Eyes: Negative.   Respiratory: Negative for cough, shortness of breath and wheezing.   Cardiovascular: Negative for chest pain, palpitations and leg swelling.  Gastrointestinal: Negative for abdominal pain, diarrhea, nausea and vomiting.  Genitourinary: Negative for dysuria, frequency and urgency.  Musculoskeletal: Negative for arthralgias and myalgias.  Skin: Negative for color change, pallor and rash.  Neurological: Negative for syncope, light-headedness and headaches.  Psychiatric/Behavioral: The patient is not nervous/anxious.       Objective:   Physical Exam  Constitutional: She appears well-nourished. No distress.  HENT:  Head: Normocephalic and atraumatic.  Nose: Mucosal edema and rhinorrhea present. No sinus tenderness. Right sinus exhibits maxillary sinus tenderness and frontal sinus tenderness. Left sinus exhibits maxillary sinus tenderness and frontal sinus tenderness.  +thick yellow nasal drainage. +PND  Eyes: Conjunctivae and EOM are normal. No scleral icterus.  Neck: Neck supple. No tracheal deviation present.  Cardiovascular: Normal rate and regular rhythm.  Pulmonary/Chest: Effort normal and breath sounds normal. No respiratory distress.  Skin: Skin is warm and dry. No pallor.  Psychiatric: She has a normal mood and affect. Her behavior is normal.  Nursing note and vitals reviewed.     Vitals:   09/23/18 1115  BP: 132/90  Pulse: 98  Temp: 98.4  F (36.9 C)  SpO2: 97%    Assessment & Plan:   Acute sinusitis-patient will take Z-Pak.  I originally wanted patient to take Augmentin course, but patient states this medication is very difficult on her stomach and also gives her a bad  yeast infection.  Patient states she has used azithromycin in the past for a sinus infection with good success.  Patient advised to also take a daily allergy medicine like Claritin or Zyrtec to help dry up nasal congestion and postnasal drip.  Patient will do saline nasal washes to help rinse out congestion.  Advised to rest increase fluid intake and do good handwashing.  Patient will keep regularly scheduled with PCP as planned.  Advised to return to clinic sooner if any issues arise.

## 2018-09-23 NOTE — Patient Instructions (Signed)
Take daily allergy medication like claritin or zyrtec to help dry up nasal congestion/post nasal drip

## 2018-10-03 ENCOUNTER — Encounter: Payer: Self-pay | Admitting: Family

## 2018-10-03 ENCOUNTER — Ambulatory Visit: Payer: Medicare Other | Admitting: Family

## 2018-10-03 VITALS — BP 126/82 | HR 85 | Temp 97.8°F | Wt 149.4 lb

## 2018-10-03 DIAGNOSIS — R198 Other specified symptoms and signs involving the digestive system and abdomen: Secondary | ICD-10-CM | POA: Diagnosis not present

## 2018-10-03 DIAGNOSIS — I700 Atherosclerosis of aorta: Secondary | ICD-10-CM

## 2018-10-03 LAB — CBC WITH DIFFERENTIAL/PLATELET
Basophils Absolute: 0 10*3/uL (ref 0.0–0.1)
Basophils Relative: 0.5 % (ref 0.0–3.0)
Eosinophils Absolute: 0 10*3/uL (ref 0.0–0.7)
Eosinophils Relative: 0.7 % (ref 0.0–5.0)
HCT: 37.7 % (ref 36.0–46.0)
Hemoglobin: 13 g/dL (ref 12.0–15.0)
Lymphocytes Relative: 24 % (ref 12.0–46.0)
Lymphs Abs: 1.1 10*3/uL (ref 0.7–4.0)
MCHC: 34.6 g/dL (ref 30.0–36.0)
MCV: 96.8 fl (ref 78.0–100.0)
Monocytes Absolute: 0.3 10*3/uL (ref 0.1–1.0)
Monocytes Relative: 6.9 % (ref 3.0–12.0)
Neutro Abs: 3.1 10*3/uL (ref 1.4–7.7)
Neutrophils Relative %: 67.9 % (ref 43.0–77.0)
Platelets: 215 10*3/uL (ref 150.0–400.0)
RBC: 3.89 Mil/uL (ref 3.87–5.11)
RDW: 12.4 % (ref 11.5–15.5)
WBC: 4.5 10*3/uL (ref 4.0–10.5)

## 2018-10-03 NOTE — Assessment & Plan Note (Signed)
Advised either increase Zocor to 40 mg daily or to start the Zetia as prescribed from Dr. Rockey Situ.  Patient will consider and let me know what she decides.

## 2018-10-03 NOTE — Patient Instructions (Addendum)
Take pepcid Ac ; stop zantac since recalled.  Please return stool cards earliest convenience. Please also consider either increasing Zocor to 40 mg once per day or adding on the Zetia as prescribed by Dr. Rockey Situ. Happy holidays!  Today we discussed referrals, orders.  Gastroenterology, colonoscopy  I have placed these orders in the system for you.  Please be sure to give [place] a call if you have not heard from our office regarding this. We should hear from [place] within ONE week with information regarding your appointment. If not, please let me know immediately.

## 2018-10-03 NOTE — Assessment & Plan Note (Signed)
Benign abdominal exam.  Strong family history of colon cancer.  Advised we move quickly with referral to gastroenterology, repeat colonoscopy.  Pending stool cards in the interim.

## 2018-10-03 NOTE — Progress Notes (Signed)
Subjective:    Patient ID: Meghan Mack, female    DOB: 1951/07/04, 67 y.o.   MRN: [id number]  CC: Meghan Mack is a 67 y.o. female who presents today for follow up.   HPI: Complains of change of bowel shape, and constipation  Several months, unchanged.  Bloating some 'here and there'.  Has struggled with constipation for a long time.  No lactose or gluten allergies. No weight loss, blood in stool.  Taking dulcolax PO and colace PRN. Not taking miralax. No abdominal pain, fever,   Interested in cologuard.  Family history colon cancer, mother  GERD- taking zantac prn.  HLD-taking Zocor 20 mg.  Not taking Zetia  Partial hysterectomy    HISTORY:  Past Medical History:  Diagnosis Date  . Anemia   . Colonic polyp   . Constipation   . COPD (chronic obstructive pulmonary disease) ([place])   . Hyperlipidemia   . IBS (irritable bowel syndrome)   . Kidney cysts   . Lung nodule   . Meningioma ([place])   . Nausea   . Palpitations   . Tonsillar cyst    Past Surgical History:  Procedure Laterality Date  . CARDIAC CATHETERIZATION  02/25/2007  . COLONOSCOPY    . ESOPHAGOGASTRODUODENOSCOPY  10/1997 & 08/2005  . FOOT SURGERY  04/25/2007   right  . THYROIDECTOMY  11/2005  . VAGINAL HYSTERECTOMY     ovaries intact   Family History  Problem Relation Age of Onset  . Cancer Mother        colon  . Emphysema Father   . Heart disease Father   . Cancer Father        leukemia  . Cancer Brother        lung  . Cancer Maternal Aunt        melanoma  . Cancer Maternal Grandfather        stomach, colon  . Breast cancer Neg Hx   . Kidney cancer Neg Hx   . Kidney disease Neg Hx   . Prostate cancer Neg Hx     Allergies: Tdap [tetanus-diphth-acell pertussis]; Codeine; Diflucan [fluconazole]; Esomeprazole magnesium; Meperidine hcl; Pneumovax 23 [pneumococcal vac polyvalent]; Propantheline bromide; Propoxyphene n-acetaminophen; Sulfa antibiotics; and Sulfonamide derivatives Current  Outpatient Medications on File Prior to Visit  Medication Sig Dispense Refill  . dexamethasone (DECADRON) 0.5 MG/5ML solution Take by mouth daily.    . ergocalciferol (VITAMIN D2) 50000 UNITS capsule Take 50,000 Units by mouth every 30 (thirty) days.    Marland Kitchen ibuprofen (ADVIL,MOTRIN) 600 MG tablet Take 1 tablet (600 mg total) by mouth every 8 (eight) hours as needed. 20 tablet 0  . levothyroxine (SYNTHROID, LEVOTHROID) 88 MCG tablet Take 1 tablet 6 days a week.    . Multiple Vitamin (MULTIVITAMIN PO) Take 1 tablet by mouth daily.      . nebivolol (BYSTOLIC) 10 MG tablet Take 0.5 tablets (5 mg total) by mouth 2 (two) times daily. 90 tablet 3  . nebivolol (BYSTOLIC) 5 MG tablet Take 1 tablet (5 mg total) by mouth daily as needed. In the evening with evening dose of Bystolic. 30 tablet 6  . neomycin-polymyxin b-dexamethasone (MAXITROL) 3.5-10000-0.1 SUSP INSTILL 1 DROP IN RIGHT EYE 4 TIMES A DAY FOR 7 DAYS THEN 1 DROP TWICE A DAY FOR 7 DAYS  0  . simvastatin (ZOCOR) 20 MG tablet TAKE 1 TABLET (20 MG) EVERY EVENING (IMPROVES CHOLESTEROL) 90 tablet 3   No current facility-administered medications on file prior to visit.  Social History   Tobacco Use  . Smoking status: Former Smoker    Packs/day: 0.25    Years: 10.00    Pack years: 2.50    Types: Cigarettes    Last attempt to quit: 09/25/1997    Years since quitting: 21.0  . Smokeless tobacco: Never Used  Substance Use Topics  . Alcohol use: No    Alcohol/week: 1.0 standard drinks    Types: 1 Standard drinks or equivalent per week  . Drug use: No    Review of Systems  Constitutional: Negative for chills, fever and unexpected weight change.  Respiratory: Negative for cough.   Cardiovascular: Negative for chest pain and palpitations.  Gastrointestinal: Positive for abdominal distention and constipation. Negative for abdominal pain, anal bleeding, blood in stool, diarrhea, nausea, rectal pain and vomiting.      Objective:    BP 126/82  (BP Location: Left Arm, Patient Position: Sitting, Cuff Size: Normal)   Pulse 85   Temp 97.8 F (36.6 C)   Wt 149 lb 6.4 oz (67.8 kg)   SpO2 98%   BMI 23.40 kg/m  BP Readings from Last 3 Encounters:  10/03/18 126/82  09/23/18 132/90  07/09/18 125/80   Wt Readings from Last 3 Encounters:  10/03/18 149 lb 6.4 oz (67.8 kg)  09/23/18 149 lb (67.6 kg)  07/09/18 145 lb (65.8 kg)    Physical Exam Vitals signs reviewed.  Constitutional:      Appearance: Normal appearance. She is well-developed.  Eyes:     Conjunctiva/sclera: Conjunctivae normal.  Cardiovascular:     Rate and Rhythm: Normal rate and regular rhythm.     Pulses: Normal pulses.     Heart sounds: Normal heart sounds.  Pulmonary:     Effort: Pulmonary effort is normal.     Breath sounds: Normal breath sounds. No wheezing, rhonchi or rales.  Abdominal:     General: Bowel sounds are normal. There is no distension.     Palpations: Abdomen is soft. Abdomen is not rigid. There is no fluid wave or mass.     Tenderness: There is no abdominal tenderness. There is no guarding or rebound.     Comments: Nondistended.  Skin:    General: Skin is warm and dry.  Neurological:     Mental Status: She is alert.  Psychiatric:        Speech: Speech normal.        Behavior: Behavior normal.        Thought Content: Thought content normal.        Assessment & Plan:   Problem List Items Addressed This Visit      Cardiovascular and Mediastinum   Aortic atherosclerosis ([place])    Advised either increase Zocor to 40 mg daily or to start the Zetia as prescribed from Dr. Rockey Situ.  Patient will consider and let me know what she decides.        Other   Change in bowel movement - Primary    Benign abdominal exam.  Strong family history of colon cancer.  Advised we move quickly with referral to gastroenterology, repeat colonoscopy.  Pending stool cards in the interim.      Relevant Orders   Ambulatory referral to Gastroenterology    CBC with Differential/Platelet   Fecal occult blood, imunochemical       I have discontinued Kessler Kopinski. Hartt's ranitidine, ezetimibe, and azithromycin. I am also having her maintain her Multiple Vitamin (MULTIVITAMIN PO), levothyroxine, ergocalciferol, ibuprofen, nebivolol, nebivolol, dexamethasone, neomycin-polymyxin  b-dexamethasone, and simvastatin.   No orders of the defined types were placed in this encounter.   Return precautions given.   Risks, benefits, and alternatives of the medications and treatment plan prescribed today were discussed, and patient expressed understanding.   Education regarding symptom management and diagnosis given to patient on AVS.  Continue to follow with Burnard Hawthorne, FNP for routine health maintenance.   Luberta Robertson and I agreed with plan.   Mable Paris, FNP

## 2018-10-06 ENCOUNTER — Ambulatory Visit
Admission: RE | Admit: 2018-10-06 | Discharge: 2018-10-06 | Disposition: A | Discharge status: Home / Self Care | Payer: Medicare Other | Source: Ambulatory Visit | Attending: Family | Admitting: Family

## 2018-10-06 DIAGNOSIS — Z1231 Encounter for screening mammogram for malignant neoplasm of breast: Secondary | ICD-10-CM

## 2018-10-09 ENCOUNTER — Other Ambulatory Visit (INDEPENDENT_AMBULATORY_CARE_PROVIDER_SITE_OTHER): Payer: Medicare Other

## 2018-10-09 DIAGNOSIS — R198 Other specified symptoms and signs involving the digestive system and abdomen: Secondary | ICD-10-CM | POA: Diagnosis not present

## 2018-10-09 LAB — FECAL OCCULT BLOOD, IMMUNOCHEMICAL: Fecal Occult Bld: NEGATIVE

## 2018-11-24 ENCOUNTER — Telehealth: Payer: Self-pay | Admitting: Cardiovascular Disease

## 2018-11-24 NOTE — Telephone Encounter (Signed)
Pt c/o medication issue:  1. Name of Medication: simvastatin   2. How are you currently taking this medication (dosage and times per day)? 20 MG 1 tablet daily   3. Are you having a reaction (difficulty breathing--STAT)?   4. What is your medication issue? Patient would like to know if she can switch to Lipitor medication.  She would like to try to see if maybe Lipitor works better for her.  Please call to discuss

## 2018-11-24 NOTE — Telephone Encounter (Signed)
Called to discuss with patient. States she never started taking the Zetia back in September 2019 when initially advised. She does not like to take pills and is trying to not add another one. She would like to know if able to switch to Lipitor to see if that helps her cholesterol without adding Zetia.  Denies any other symptoms or problems with the simvastatin at this time.  Advised I will route to Dr Rockey Situ for his advice. She stated that was good and that she trusted Dr Donivan Scull advice.

## 2018-11-30 NOTE — Telephone Encounter (Signed)
Numbers are adequate, She can stay on simva if she would like Otherwise lipitor 20 daily

## 2018-12-01 MED ORDER — EZETIMIBE 10 MG PO TABS: 10.0000 mg | ORAL_TABLET | Freq: Every day | ORAL | 3 refills | Status: DC

## 2018-12-01 NOTE — Telephone Encounter (Signed)
Spoke with patient and reviewed medications in detail regarding cholesterol and her lab readings. Reviewed options for cholesterol management and also the zetia she was previously prescribed. Reviewed options and how these help in control of her readings. She was agreeable to try the zetia once daily in conjunction with her current simvastatin and will let us know if she has any problems with tolerating these. She was very appreciative for the call with no further questions at this time.

## 2018-12-08 NOTE — Addendum Note (Signed)
Addended by: Vanessa Ralphs on: 12/08/2018 12:39 PM   Modules accepted: Orders

## 2018-12-08 NOTE — Telephone Encounter (Signed)
FYI to Dr Rockey Situ.

## 2018-12-08 NOTE — Telephone Encounter (Signed)
Patient calling States that she has been taking the Zetia for a week Has noticed stomach cramps and nausea  Does not wish to take anymore  Please call to discuss

## 2018-12-08 NOTE — Telephone Encounter (Signed)
Spoke with patient. States every time she took the Zetia this week she would get nauseas and stomach cramps a little after taking it. She would like to elect to stay on the Simvastatin for now and discuss further with Dr Rockey Situ at year follow up scheduled in June. She was appreciative.

## 2018-12-30 ENCOUNTER — Encounter

## 2018-12-31 ENCOUNTER — Other Ambulatory Visit: Payer: Self-pay

## 2018-12-31 ENCOUNTER — Ambulatory Visit: Payer: Medicare Other | Admitting: Family

## 2018-12-31 ENCOUNTER — Ambulatory Visit (INDEPENDENT_AMBULATORY_CARE_PROVIDER_SITE_OTHER): Payer: Medicare Other

## 2018-12-31 ENCOUNTER — Encounter (INDEPENDENT_AMBULATORY_CARE_PROVIDER_SITE_OTHER): Payer: Medicare Other | Admitting: Family

## 2018-12-31 ENCOUNTER — Encounter: Payer: Self-pay | Admitting: Family

## 2018-12-31 DIAGNOSIS — M25552 Pain in left hip: Secondary | ICD-10-CM

## 2018-12-31 NOTE — Telephone Encounter (Signed)
Verbal consent for services obtained from patient prior to services given.  Names of all persons present for services: Meghan Paris, NP Chief complaint:  Has been having left groin hip pain x couple months, waxes and wanes. 'burning, catching feeling.' Pain if lays on right side.  No pain with rest. Can hear pop, crack sound.  No falls, injury.  Urinating, BM normally No h/o cancer.  Pain with walking long distances. Endorses weakness  Seeing chiropractor whom suggested XR.  hasnt taken much ibuprofen.   HLD- on zocor 20mg .  Unable to tolerate Zetia.  BM are 'normal for me.' Follows with Dr Vira Agar. Colonoscopy due. Negative stool cards  History of hyperlipidemia, CAD.  No cancer history.   A/P/next steps: Left groin pain- based on HPI as explained to patient, suspect that it could be pelvic in nature.  No red flag symptoms identified on the phone.  We will go ahead and do x-rays.  She politely declines referral to orthopedics at this time and like to wait on x-rays.  She would like to continue ibuprofen, Tylenol over-the-counter.  She will let me know of any new symptoms Hyperlipidemia-continue Zocor 20 mg for now.  She politely declines trialing more potent statin such as Crestor, Lipitor.  She will let me know changes her mind Bowel movements-continue to be at patient's baseline.  She feels comfortable calling Dr. Percell Boston office.  She politely declines a referral from me to GI at this time.  She understands importance of colonoscopy.  I spent a total of 15 minutes speaking with patient regards to chief complaint of hip pain as well as chronic conditions including hyperlipidemia, change in bowel habits.  Cc to [employer]

## 2019-01-05 ENCOUNTER — Ambulatory Visit: Payer: Medicare Other

## 2019-01-12 ENCOUNTER — Ambulatory Visit: Payer: Medicare Other

## 2019-03-05 ENCOUNTER — Other Ambulatory Visit: Payer: Self-pay

## 2019-03-05 ENCOUNTER — Ambulatory Visit (INDEPENDENT_AMBULATORY_CARE_PROVIDER_SITE_OTHER): Payer: Medicare Other

## 2019-03-05 DIAGNOSIS — Z0000 Encounter for general adult medical examination without abnormal findings: Secondary | ICD-10-CM

## 2019-03-05 NOTE — Progress Notes (Signed)
Subjective:   Meghan Mack is a 68 y.o. female who presents for Medicare Annual (Subsequent) preventive examination.  Review of Systems:  No ROS.  Medicare Wellness Virtual Visit.  Visual/audio telehealth visit, UTA vital signs.   See social history for additional risk factors.   Cardiac Risk Factors include: advanced age (>56men, >61 women)     Objective:     Vitals: There were no vitals taken for this visit.  There is no height or weight on file to calculate BMI.  Advanced Directives 03/05/2019 01/01/2018 11/27/2017  Does Patient Have a Medical Advance Directive? Yes Yes No  Type of Paramedic of [place];Living will [place];Living will -  Does patient want to make changes to medical advance directive? No - Patient declined No - Patient declined -  Copy of [place] in Chart? No - copy requested No - copy requested -  Would patient like information on creating a medical advance directive? - - No - Patient declined    Tobacco Social History   Tobacco Use  Smoking Status Former Smoker  . Packs/day: 0.25  . Years: 10.00  . Pack years: 2.50  . Types: Cigarettes  . Last attempt to quit: 09/25/1997  . Years since quitting: 21.4  Smokeless Tobacco Never Used     Counseling given: Not Answered   Clinical Intake:  Pre-visit preparation completed: Yes        Diabetes: No  How often do you need to have someone help you when you read instructions, pamphlets, or other written materials from your doctor or pharmacy?: 1 - Never  Interpreter Needed?: No     Past Medical History:  Diagnosis Date  . Anemia   . Colonic polyp   . Constipation   . COPD (chronic obstructive pulmonary disease) ([place])   . Hyperlipidemia   . IBS (irritable bowel syndrome)   . Kidney cysts   . Lung nodule   . Meningioma ([place])   . Nausea   . Palpitations   . Tonsillar cyst    Past Surgical History:  Procedure  Laterality Date  . CARDIAC CATHETERIZATION  02/25/2007  . COLONOSCOPY    . ESOPHAGOGASTRODUODENOSCOPY  10/1997 & 08/2005  . FOOT SURGERY  04/25/2007   right  . THYROIDECTOMY  11/2005  . VAGINAL HYSTERECTOMY     ovaries intact   Family History  Problem Relation Age of Onset  . Cancer Mother        colon  . Emphysema Father   . Heart disease Father   . Cancer Father        leukemia  . Cancer Brother        lung  . Cancer Maternal Aunt        melanoma  . Cancer Maternal Grandfather        stomach, colon  . Breast cancer Neg Hx   . Kidney cancer Neg Hx   . Kidney disease Neg Hx   . Prostate cancer Neg Hx    Social History   Socioeconomic History  . Marital status: Married    Spouse name: Not on file  . Number of children: 2  . Years of education: Not on file  . Highest education level: Not on file  Occupational History  . Occupation: Metallurgist: [employer]  . Financial resource strain: Not hard at all  . Food insecurity:    Worry: Never true  Inability: Never true  . Transportation needs:    Medical: No    Non-medical: No  Tobacco Use  . Smoking status: Former Smoker    Packs/day: 0.25    Years: 10.00    Pack years: 2.50    Types: Cigarettes    Last attempt to quit: 09/25/1997    Years since quitting: 21.4  . Smokeless tobacco: Never Used  Substance and Sexual Activity  . Alcohol use: No    Alcohol/week: 1.0 standard drinks    Types: 1 Standard drinks or equivalent per week  . Drug use: No  . Sexual activity: Yes    Partners: Male    Birth control/protection: Surgical  Lifestyle  . Physical activity:    Days per week: 3 days    Minutes per session: 40 min  . Stress: Only a little  Relationships  . Social connections:    Talks on phone: Not on file    Gets together: Not on file    Attends religious service: Not on file    Active member of club or organization: Not on file    Attends meetings of clubs or  organizations: Not on file    Relationship status: Not on file  Other Topics Concern  . Not on file  Social History Narrative   Lives with husband in [place], has 2 children and 2 step-children. No pets.      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       Diet - regular   Exercise - walks 5 days per week   1.5 years of college    Outpatient Encounter Medications as of 03/05/2019  Medication Sig  . dexamethasone (DECADRON) 0.5 MG/5ML solution Take by mouth daily.  . ergocalciferol (VITAMIN D2) 50000 UNITS capsule Take 50,000 Units by mouth every 30 (thirty) days.  Marland Kitchen ibuprofen (ADVIL,MOTRIN) 600 MG tablet Take 1 tablet (600 mg total) by mouth every 8 (eight) hours as needed.  Marland Kitchen levothyroxine (SYNTHROID, LEVOTHROID) 88 MCG tablet Take 1 tablet 6 days a week.  . Multiple Vitamin (MULTIVITAMIN PO) Take 1 tablet by mouth daily.    . nebivolol (BYSTOLIC) 10 MG tablet Take 0.5 tablets (5 mg total) by mouth 2 (two) times daily.  . nebivolol (BYSTOLIC) 5 MG tablet Take 1 tablet (5 mg total) by mouth daily as needed. In the evening with evening dose of Bystolic.  Marland Kitchen neomycin-polymyxin b-dexamethasone (MAXITROL) 3.5-10000-0.1 SUSP INSTILL 1 DROP IN RIGHT EYE 4 TIMES A DAY FOR 7 DAYS THEN 1 DROP TWICE A DAY FOR 7 DAYS  . simvastatin (ZOCOR) 20 MG tablet TAKE 1 TABLET (20 MG) EVERY EVENING (IMPROVES CHOLESTEROL)   No facility-administered encounter medications on file as of 03/05/2019.     Activities of Daily Living In your present state of health, do you have any difficulty performing the following activities: 03/05/2019  Hearing? N  Vision? N  Difficulty concentrating or making decisions? N  Walking or climbing stairs? N  Dressing or bathing? N  Doing errands, shopping? N  Preparing Food and eating ? N  Using the Toilet? N  In the past six months, have you accidently leaked urine? N  Do you have problems with loss of bowel control? N  Managing your Medications? N  Managing  your Finances? N  Housekeeping or managing your Housekeeping? N  Some recent data might be hidden    Patient Care Team: Burnard Hawthorne, FNP as PCP - General (Family Medicine) Ida Rogue  J, MD as Consulting Physician (Cardiology)    Assessment:   This is a routine wellness examination for Meghan Mack.  I connected with patient 03/05/19 at  9:30 AM EDT by a video/audio enabled telemedicine application and verified that I am speaking with the correct person using two identifiers. Patient stated full name and DOB. Patient gave permission to continue with virtual visit. Patient's location was at home and Nurse's location was at [place] office.   Reports still seeing her ophthalmologist every 6 months to monitor pressure. Notes some new changes with patterns moving while trying to focus.  Seen by specialist at [facility]. She plans to monitor changes at home and reach her pcp for further intervention if symptoms worsen. Declined sooner follow up than scheduled in September.   Health Screenings  Mammogram - 09/2020 Colonoscopy - 03/2019 Bone Density - 11/2017 Glaucoma -none Hearing -demonstrates normal hearing during visit. Hemoglobin A1C - 07/2017 (5.4) Cholesterol - 06/2018 (156) Dental- every 6 months Vision- visits within the last 6 months. Last visit 02/2019.  Social  Alcohol intake - no         Smoking history-  former  Smokers in home? none Illicit drug use? none Exercise - walking Diet - regular Sexually Active - yes BMI- discussed the importance of a healthy diet, water intake and the benefits of aerobic exercise.  Educational material provided.   Safety  Patient feels safe at home- yes Patient does have smoke detectors at home- yes Patient does wear sunscreen or protective clothing when in direct sunlight -yes Patient does wear seat belt when in a moving vehicle -yes Fall alert- yes  Covid-19 precautions and sickness symptoms discussed.   Activities of Daily Living Patient  denies needing assistance with: driving, household chores, feeding themselves, getting from bed to chair, getting to the toilet, bathing/showering, dressing, managing money, or preparing meals.  No new identified risk were noted.    Depression Screen Patient denies losing interest in daily life, feeling hopeless, or crying easily over simple problems.   Medication-taking as directed and without issues.   Fall Screen Patient denies being afraid of falling or falling in the last year. Apple phone fall alert system worn.  Memory Screen Patient is alert.  Patient denies difficulty focusing, concentrating or misplacing items. Correctly identified the president of the [place] , season and recall.  Immunizations The following Immunizations were discussed: Influenza, shingles, pneumonia, and tetanus.   Other Providers Patient Care Team: Burnard Hawthorne, FNP as PCP - General (Family Medicine) Minna Merritts, MD as Consulting Physician (Cardiology)  Exercise Activities and Dietary recommendations Current Exercise Habits: Structured exercise class, Type of exercise: walking, Time (Minutes): 20, Frequency (Times/Week): 4, Weekly Exercise (Minutes/Week): 80, Intensity: Mild  Goals      Patient Stated   . Increase physical activity (pt-stated)     Repetitive hands free sit to stand exercises        Fall Risk Fall Risk  03/05/2019 01/01/2018 12/20/2017 07/08/2017 12/10/2016  Falls in the past year? 0 Yes Yes No No  Number falls in past yr: - 1 2 or more - -  Injury with Fall? - No No - -  Risk Factor Category  - - - - -  Risk for fall due to : - - - - -  Risk for fall due to: Comment - - - - -  Follow up - Falls prevention discussed - - -   Depression Screen [facility] 2/9 Scores 03/05/2019 10/03/2018 01/01/2018 07/08/2017  PHQ - 2 Score 0 0 0 0  PHQ- 9 Score - 0 - -     Cognitive Function MMSE - Mini Mental State Exam 01/01/2018 08/03/2015  Not completed: Refused -  Orientation to time - 5   Orientation to Place - 5  Registration - 3  Attention/ Calculation - 4  Recall - 2  Language- name 2 objects - 2  Language- repeat - 1  Language- follow 3 step command - 3  Language- read & follow direction - 1  Write a sentence - 1  Copy design - 1  Total score - 28   Montreal Cognitive Assessment  01/19/2016  Visuospatial/ Executive (0/5) 3  Naming (0/3) 3  Attention: Read list of digits (0/2) 2  Attention: Read list of letters (0/1) 1  Attention: Serial 7 subtraction starting at 100 (0/3) 0  Language: Repeat phrase (0/2) 2  Language : Fluency (0/1) 1  Abstraction (0/2) 2  Delayed Recall (0/5) 3  Orientation (0/6) 6  Total 23  Adjusted Score (based on education) 23   6CIT Screen 03/05/2019  What Year? 0 points  What month? 0 points  What time? 0 points  Count back from 20 0 points  Months in reverse 0 points  Repeat phrase 0 points  Total Score 0    Immunization History  Administered Date(s) Administered  . Influenza Split 07/15/2011, 06/26/2014  . Influenza Whole 06/22/2009, 06/13/2010, 07/05/2012, 07/06/2018  . Influenza-Unspecified 06/15/2013, 06/19/2015, 06/29/2016  . Pneumococcal Conjugate-13 06/27/2015  . Pneumococcal Polysaccharide-23 08/11/2007  . Td 07/17/2001  . Tdap 09/03/2011  . Zoster 06/13/2010   Screening Tests Health Maintenance  Topic Date Due  . COLONOSCOPY  04/13/2019  . INFLUENZA VACCINE  05/16/2019  . MAMMOGRAM  10/06/2020  . TETANUS/TDAP  09/02/2021  . DEXA SCAN  Completed  . Hepatitis C Screening  Completed  . PNA vac Low Risk Adult  Discontinued      Plan:    End of life planning; Advance aging; Advanced directives discussed.  Copy of current HCPOA/Living Will requested.    I have personally reviewed and noted the following in the patient's chart:   . Medical and social history . Use of alcohol, tobacco or illicit drugs  . Current medications and supplements . Functional ability and status . Nutritional status . Physical  activity . Advanced directives . List of other physicians . Hospitalizations, surgeries, and ER visits in previous 12 months . Vitals . Screenings to include cognitive, depression, and falls . Referrals and appointments  In addition, I have reviewed and discussed with patient certain preventive protocols, quality metrics, and best practice recommendations. A written personalized care plan for preventive services as well as general preventive health recommendations were provided to patient.     Varney Biles, LPN  [id number]   Agree with plan. Mable Paris, NP

## 2019-03-05 NOTE — Patient Instructions (Addendum)
  Ms. Dufrane , Thank you for taking time to come for your Medicare Wellness Visit. I appreciate your ongoing commitment to your health goals. Please review the following plan we discussed and let me know if I can assist you in the future.   These are the goals we discussed: Goals      Patient Stated   . Increase physical activity (pt-stated)     Repetitive hands free sit to stand exercises        This is a list of the screening recommended for you and due dates:  Health Maintenance  Topic Date Due  . Colon Cancer Screening  04/13/2019  . Flu Shot  05/16/2019  . Mammogram  10/06/2020  . Tetanus Vaccine  09/02/2021  . DEXA scan (bone density measurement)  Completed  .  Hepatitis C: One time screening is recommended by Center for Disease Control  (CDC) for  adults born from [birth year] through 1966.   Completed  . Pneumonia vaccines  Discontinued

## 2019-03-18 ENCOUNTER — Telehealth: Payer: Self-pay

## 2019-03-18 DIAGNOSIS — Z79899 Other long term (current) drug therapy: Secondary | ICD-10-CM

## 2019-03-18 DIAGNOSIS — E782 Mixed hyperlipidemia: Secondary | ICD-10-CM

## 2019-03-18 NOTE — Telephone Encounter (Signed)
Attempted to call pt but received a fast busy signal x2. Will try her back.

## 2019-03-18 NOTE — Telephone Encounter (Signed)
Called patient.  Made her aware Dr. Rockey Situ was currently limiting patients coming into the office due to COVID-19.  Offered Telehealth appt. Patient declined.   Patient stated she was not having any symptoms at this time and would like to wait and see him in person.   Patient would like to know if we can put in an order for her cholesterol to be check at a [facility]. She stated she could not take Zetia and she would like to know what her cholesterol is.

## 2019-03-19 NOTE — Telephone Encounter (Signed)
Lipin panel last checked 07/09/2018. Last office visit with Dr Rockey Situ 05/05/2018. Patient was unable to take Zetia due to GI upset. Patient does not want to do evisit at this time. Patient asked if she could have lab work to check cholesterol level. Routing to Dr Rockey Situ.

## 2019-03-19 NOTE — Telephone Encounter (Signed)
She is welcome to have lipids completed Though probably early, as this can be checked once a year Last checked 06/2018

## 2019-03-20 NOTE — Telephone Encounter (Signed)
No answer. Left message to call back.   

## 2019-03-20 NOTE — Telephone Encounter (Signed)
Patient calling back. She verbalized understanding of Dr Donivan Scull recommendations and decided she will wait until September to get the lab work and see Dr Rockey Situ. She is not having any other cardiac symptoms or concerns at this time.  Patient aware we may still be doing virtual visits. She will plan to call back in July or August to schedule an appt. She is aware to go to the [facility] for lab work and to be fasting.

## 2019-03-20 NOTE — Addendum Note (Signed)
Addended by: Vanessa Ralphs on: 03/20/2019 09:35 AM   Modules accepted: Orders

## 2019-03-31 ENCOUNTER — Ambulatory Visit: Payer: Medicare Other | Admitting: Cardiovascular Disease

## 2019-04-14 ENCOUNTER — Encounter: Payer: Self-pay | Admitting: Family

## 2019-04-15 ENCOUNTER — Other Ambulatory Visit: Payer: Self-pay

## 2019-04-15 ENCOUNTER — Telehealth: Payer: Self-pay

## 2019-04-15 ENCOUNTER — Other Ambulatory Visit (INDEPENDENT_AMBULATORY_CARE_PROVIDER_SITE_OTHER): Payer: Medicare Other

## 2019-04-15 DIAGNOSIS — R300 Dysuria: Secondary | ICD-10-CM

## 2019-04-15 LAB — POCT URINALYSIS DIPSTICK
Blood, UA: NEGATIVE
Glucose, UA: NEGATIVE
Ketones, UA: NEGATIVE
Leukocytes, UA: NEGATIVE
Nitrite, UA: NEGATIVE
Protein, UA: POSITIVE — AB
Spec Grav, UA: >=1.03 — AB (ref 1.010–1.025)
Urobilinogen, UA: 0.2 E.U./dL
pH, UA: 5.5 (ref 5.0–8.0)

## 2019-04-15 NOTE — Telephone Encounter (Signed)
I spoke with patient & she is leaving urine sample today. She is scheduled with Lauren in the morning. She is having some back pain with dysuria.

## 2019-04-15 NOTE — Telephone Encounter (Signed)
Call pt Yes may order urine studies - however yes any urine studies require visit - she may do so with me , Lauren etc . Whatever suits patients schedule

## 2019-04-15 NOTE — Telephone Encounter (Signed)
Per mychart messages patient wanted to drop off urine due to possible UTI. She did not want to come in. I sent her a message back offering virtual visit. Would you be okay with me ordering her urinalysis, POCT urine dip & micro?

## 2019-04-15 NOTE — Progress Notes (Unsigned)
poct

## 2019-04-15 NOTE — Telephone Encounter (Signed)
I spoke with patient & she is schedule to come in to give urine this afternoon. She is scheduled with Lauren in the morning for a doxy.

## 2019-04-16 ENCOUNTER — Ambulatory Visit (INDEPENDENT_AMBULATORY_CARE_PROVIDER_SITE_OTHER): Payer: Medicare Other | Admitting: Family Medicine

## 2019-04-16 DIAGNOSIS — R300 Dysuria: Secondary | ICD-10-CM

## 2019-04-16 DIAGNOSIS — R350 Frequency of micturition: Secondary | ICD-10-CM

## 2019-04-16 LAB — URINALYSIS, MICROSCOPIC ONLY
RBC / HPF: NONE SEEN (ref 0–?)
WBC, UA: NONE SEEN (ref 0–?)

## 2019-04-16 MED ORDER — NITROFURANTOIN MONOHYD MACRO 100 MG PO CAPS: 100.0000 mg | ORAL_CAPSULE | Freq: Two times a day (BID) | ORAL | 0 refills | Status: AC

## 2019-04-16 NOTE — Progress Notes (Signed)
Patient ID: Meghan Mack, female   DOB: Jun 18, 1951, 68 y.o.   MRN: [id number]    Virtual Visit via video/phone Note  This visit type was conducted due to national recommendations for restrictions regarding the COVID-19 pandemic (e.g. social distancing).  This format is felt to be most appropriate for this patient at this time.  All issues noted in this document were discussed and addressed.  No physical exam was performed (except for noted visual exam findings with Video Visits).   I connected with Howie Ill today at  8:40 AM EDT by a video enabled telemedicine application or telephone and verified that I am speaking with the correct person using two identifiers. Location patient: home Location provider: work or home office Persons participating in the virtual visit: patient, provider  I discussed the limitations, risks, security and privacy concerns of performing an evaluation and management service by telephone/video and the availability of in person appointments. I also discussed with the patient that there may be a patient responsible charge related to this service. The patient expressed understanding and agreed to proceed.  Interactive audio and video telecommunications were attempted between this provider and patient, however failed, due to patient having technical difficulties.  We continued and completed visit with audio only after 3 failed video connection attempts.   HPI:  Patient and I connected via telephone after 3 failed video attempts to discuss dysuria, increased urinary frequency and darker colored urine.  Patient states symptoms have been present for 2 days.  States last night she got up 3 times to urinate and usually will get up 0-1 times throughout the night to use the bathroom.  Patient states her urine is also been a darker yellow color.  Denies any visible blood or mucus in urine.  Admits that she might not have been drinking enough water over the past couple of days  due to being very busy with moving.  Denies any fever or chills.  Denies nausea or vomiting.  Denies diarrhea.  Denies body aches.   ROS: See pertinent positives and negatives per HPI.  Past Medical History:  Diagnosis Date  . Anemia   . Colonic polyp   . Constipation   . COPD (chronic obstructive pulmonary disease) ([place])   . Hyperlipidemia   . IBS (irritable bowel syndrome)   . Kidney cysts   . Lung nodule   . Meningioma ([place])   . Nausea   . Palpitations   . Tonsillar cyst     Past Surgical History:  Procedure Laterality Date  . CARDIAC CATHETERIZATION  02/25/2007  . COLONOSCOPY    . ESOPHAGOGASTRODUODENOSCOPY  10/1997 & 08/2005  . FOOT SURGERY  04/25/2007   right  . THYROIDECTOMY  11/2005  . VAGINAL HYSTERECTOMY     ovaries intact    Family History  Problem Relation Age of Onset  . Cancer Mother        colon  . Emphysema Father   . Heart disease Father   . Cancer Father        leukemia  . Cancer Brother        lung  . Cancer Maternal Aunt        melanoma  . Cancer Maternal Grandfather        stomach, colon  . Breast cancer Neg Hx   . Kidney cancer Neg Hx   . Kidney disease Neg Hx   . Prostate cancer Neg Hx    Social History   Tobacco Use  .  Smoking status: Former Smoker    Packs/day: 0.25    Years: 10.00    Pack years: 2.50    Types: Cigarettes    Quit date: 09/25/1997    Years since quitting: 21.5  . Smokeless tobacco: Never Used  Substance Use Topics  . Alcohol use: No    Alcohol/week: 1.0 standard drinks    Types: 1 Standard drinks or equivalent per week    Current Outpatient Medications:  .  dexamethasone (DECADRON) 0.5 MG/5ML solution, Take by mouth daily., Disp: , Rfl:  .  ergocalciferol (VITAMIN D2) 50000 UNITS capsule, Take 50,000 Units by mouth every 30 (thirty) days., Disp: , Rfl:  .  ibuprofen (ADVIL,MOTRIN) 600 MG tablet, Take 1 tablet (600 mg total) by mouth every 8 (eight) hours as needed., Disp: 20 tablet, Rfl: 0 .   levothyroxine (SYNTHROID, LEVOTHROID) 88 MCG tablet, Take 1 tablet 6 days a week., Disp: , Rfl:  .  Multiple Vitamin (MULTIVITAMIN PO), Take 1 tablet by mouth daily.  , Disp: , Rfl:  .  nebivolol (BYSTOLIC) 10 MG tablet, Take 0.5 tablets (5 mg total) by mouth 2 (two) times daily., Disp: 90 tablet, Rfl: 3 .  nebivolol (BYSTOLIC) 5 MG tablet, Take 1 tablet (5 mg total) by mouth daily as needed. In the evening with evening dose of Bystolic., Disp: 30 tablet, Rfl: 6 .  neomycin-polymyxin b-dexamethasone (MAXITROL) 3.5-10000-0.1 SUSP, INSTILL 1 DROP IN RIGHT EYE 4 TIMES A DAY FOR 7 DAYS THEN 1 DROP TWICE A DAY FOR 7 DAYS, Disp: , Rfl: 0 .  simvastatin (ZOCOR) 20 MG tablet, TAKE 1 TABLET (20 MG) EVERY EVENING (IMPROVES CHOLESTEROL), Disp: 90 tablet, Rfl: 3 .  nitrofurantoin, macrocrystal-monohydrate, (MACROBID) 100 MG capsule, Take 1 capsule (100 mg total) by mouth 2 (two) times daily for 5 days., Disp: 10 capsule, Rfl: 0  EXAM:  GENERAL: alert, oriented, sounds well and in no acute distress  LUNGS: speaking in full sentences,  no signs of respiratory distress, breathing rate appears normal, no obvious gross SOB, gasping or wheezing  PSYCH/NEURO: pleasant and cooperative, no obvious depression or anxiety, speech and thought processing grossly intact  ASSESSMENT AND PLAN:  Discussed the following assessment and plan:  Dysuria, increased urinary frequency- patient did drop off urine sample yesterday.  Urinalysis is positive for elevated specific gravity and some protein, but negative for nitrites and leukocytes.  Urine culture will not result until tomorrow or Saturday.  Patient is concerned that her symptoms worsen over the holiday weekend.  We we will treat patient with Macrobid twice daily for 5 days to cover possible UTI.  Patient also advised to increase water intake, avoid caffeinated and sugary beverages, wear cotton underwear and always be sure to wipe front to back after using restroom.  Advised  that we will contact her with urine culture results when they are available.   I discussed the assessment and treatment plan with the patient. The patient was provided an opportunity to ask questions and all were answered. The patient agreed with the plan and demonstrated an understanding of the instructions.   The patient was advised to call back or seek an in-person evaluation if the symptoms worsen or if the condition fails to improve as anticipated.  I provided 15 minutes of non-face-to-face time over phone during this encounter.   Jodelle Green, FNP

## 2019-04-17 LAB — URINE CULTURE
MICRO NUMBER:: 626057
SPECIMEN QUALITY:: ADEQUATE

## 2019-05-22 ENCOUNTER — Encounter: Payer: Self-pay | Admitting: Family

## 2019-05-22 ENCOUNTER — Other Ambulatory Visit: Payer: Self-pay

## 2019-05-22 ENCOUNTER — Ambulatory Visit (INDEPENDENT_AMBULATORY_CARE_PROVIDER_SITE_OTHER): Payer: Medicare Other | Admitting: Family

## 2019-05-22 DIAGNOSIS — R519 Headache, unspecified: Secondary | ICD-10-CM | POA: Insufficient documentation

## 2019-05-22 DIAGNOSIS — M542 Cervicalgia: Secondary | ICD-10-CM | POA: Insufficient documentation

## 2019-05-22 DIAGNOSIS — R51 Headache: Secondary | ICD-10-CM | POA: Diagnosis not present

## 2019-05-22 NOTE — Assessment & Plan Note (Signed)
Suspect arthritic. Declines mobic. Wil continue OTC advil. Deferred imaging of c spine at this time Will pursue PT. She will let me know how she is doing.

## 2019-05-22 NOTE — Progress Notes (Signed)
This visit type was conducted due to national recommendations for restrictions regarding the COVID-19 pandemic (e.g. social distancing).  This format is felt to be most appropriate for this patient at this time.  All issues noted in this document were discussed and addressed.  No physical exam was performed (except for noted visual exam findings with Video Visits). Virtual Visit via Video Note  I connected with@  on 05/25/19 at 11:30 AM EDT by a video enabled telemedicine application and verified that I am speaking with the correct person using two identifiers.  Location patient: home Location provider:work Persons participating in the virtual visit: patient, provider  I discussed the limitations of evaluation and management by telemedicine and the availability of in person appointments. The patient expressed understanding and agreed to proceed.   HPI:  CC: neck pain for 8 months, unchanged.  Doesn't hurt all the time, feels it moves a certain way, laterally. Desribes as 'catch.'   NO arm pain, weakness, vision changes.  Has been seeing chiropractor and feels may have made it worse.   Taking advil with relief.  Describes sharp pain in the head which initially started by right ear, and then next episode occurred at top of head. Self limiting, about 3 days ago , noticed this sensation. Lasts for a second, calls them 'lightening bolt.' Intensity is severe. No congestion, vision changes, dizziness, syncope.   Occasionally has vertigo for years, sporadic, if wakes up on back in bed, room will spin. No ear pain, pressure. Kids have noticed to hearing loss. Chronic BL tinnitus.  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  Past  Medical History:  Diagnosis Date  . Anemia   . Colonic polyp   . Constipation   . COPD (chronic obstructive pulmonary disease) ([place])   . Hyperlipidemia   . IBS (irritable bowel syndrome)   . Kidney cysts   . Lung nodule   . Meningioma ([place])   . Nausea   . Palpitations   . Tonsillar cyst     Past Surgical History:  Procedure Laterality Date  . CARDIAC CATHETERIZATION  02/25/2007  . COLONOSCOPY    . ESOPHAGOGASTRODUODENOSCOPY  10/1997 & 08/2005  . FOOT SURGERY  04/25/2007   right  . THYROIDECTOMY  11/2005  . VAGINAL HYSTERECTOMY     ovaries intact    Family History  Problem Relation Age of Onset  . Cancer Mother        colon  . Emphysema Father   . Heart disease Father   . Cancer Father        leukemia  . Cancer Brother        lung  . Cancer Maternal Aunt        melanoma  . Cancer Maternal Grandfather        stomach, colon  . Breast cancer Neg Hx   . Kidney cancer Neg Hx   . Kidney disease Neg Hx   . Prostate cancer Neg Hx     SOCIAL HX: former   Current Outpatient Medications:  .  ergocalciferol (VITAMIN D2) 50000 UNITS capsule, Take 50,000 Units by mouth every 30 (thirty) days., Disp: , Rfl:  .  ibuprofen (ADVIL,MOTRIN) 600 MG tablet, Take 1 tablet (600 mg total) by mouth every  8 (eight) hours as needed., Disp: 20 tablet, Rfl: 0 .  levothyroxine (SYNTHROID, LEVOTHROID) 88 MCG tablet, Take 1 tablet 6 days a week., Disp: , Rfl:  .  Multiple Vitamin (MULTIVITAMIN PO), Take 1 tablet by mouth daily.  , Disp: , Rfl:  .  simvastatin (ZOCOR) 20 MG tablet, TAKE 1 TABLET (20 MG) EVERY EVENING (IMPROVES CHOLESTEROL), Disp: 90 tablet, Rfl: 3 .  nebivolol (BYSTOLIC) 10 MG tablet, Take 0.5 tablets (5 mg total) by mouth 2 (two) times daily. (Patient not taking: Reported on 05/22/2019), Disp: 90 tablet, Rfl: 3 .  nebivolol (BYSTOLIC) 5 MG tablet, Take 1 tablet (5 mg total) by mouth daily as needed. In the evening with evening dose of Bystolic. (Patient not taking: Reported on  05/22/2019), Disp: 30 tablet, Rfl: 6  EXAM:  VITALS per patient if applicable:  GENERAL: alert, oriented, appears well and in no acute distress  HEENT: atraumatic, conjunttiva clear, no obvious abnormalities on inspection of external nose and ears  NECK: normal movements of the head and neck  LUNGS: on inspection no signs of respiratory distress, breathing rate appears normal, no obvious gross SOB, gasping or wheezing  CV: no obvious cyanosis  MS: moves all visible extremities without noticeable abnormality  PSYCH/NEURO: pleasant and cooperative, no obvious depression or anxiety, speech and thought processing grossly intact  ASSESSMENT AND PLAN:  Discussed the following assessment and plan:  Problem List Items Addressed This Visit      Other   Headache - Primary    Appears to have ice pick HA. No episodes today. She also has associated vertigo,  Tinnitus. Discussion of BPPV versus meniere's. Suspect cervical spine OA may be contributory as well.  We also discussed more concerning etiologies including CVA, aneurysm. Declines stat imaging today as she states unable to have tests today, and that she would like to have imaging down next week. Declines ENT referral. Emphasized that she needs to stay vigilant in regards to HA , presentation; if any new or worsening symptoms, she understands to go immediately to ED over the weekend.        Relevant Orders   MR Brain W Wo Contrast   MR Angiogram Head Wo Contrast   Neck pain    Suspect arthritic. Declines mobic. Wil continue OTC advil. Deferred imaging of c spine at this time Will pursue PT. She will let me know how she is doing.      Relevant Orders   Ambulatory referral to Physical Therapy        I discussed the assessment and treatment plan with the patient. The patient was provided an opportunity to ask questions and all were answered. The patient agreed with the plan and demonstrated an understanding of the instructions.    The patient was advised to call back or seek an in-person evaluation if the symptoms worsen or if the condition fails to improve as anticipated.   Mable Paris, FNP

## 2019-05-22 NOTE — Patient Instructions (Addendum)
With your constellation of symptoms, we discussed today several looking differentials including an ice pick headache, benign positional vertigo, Meniere's disease as well as more worrisome differentials including stroke, aneurysm.    It is most important stay vigilant in regards to HA, presentation; if any new or worsening symptoms, please go immediately to emergency room or call 911.  We will pursue MRI and MRA of Brain.  If you not hear from [place] in the next couple of days with scheduled time for these, please let me know.       Stay safe!   Cervical Radiculopathy  Cervical radiculopathy happens when a nerve in the neck (a cervical nerve) is pinched or bruised. This condition can happen because of an injury to the cervical spine (vertebrae) in the neck, or as part of the normal aging process. Pressure on the cervical nerves can cause pain or numbness that travels from the neck all the way down into the arm and fingers. Usually, this condition gets better with rest. Treatment may be needed if the condition does not improve. What are the causes? This condition may be caused by:  A neck injury.  A bulging (herniated) disk.  Muscle spasms.  Muscle tightness in the neck because of overuse.  Arthritis.  Breakdown or degeneration in the bones and joints of the spine (spondylosis) due to aging.  Bone spurs that may develop near the cervical nerves. What are the signs or symptoms? Symptoms of this condition include:  Pain. The pain may travel from the neck to the arm and hand. The pain can be severe or irritating. It may be worse when you move your neck.  Numbness or tingling in your arm or hand.  Weakness in the affected arm and hand, in severe cases. How is this diagnosed? This condition may be diagnosed based on your symptoms, your medical history, and a physical exam. You may also have tests, including:  X-rays.  A CT scan.  An MRI.  An electromyogram (EMG).  Nerve  conduction tests. How is this treated? In many cases, treatment is not needed for this condition. With rest, the condition usually gets better over time. If treatment is needed, options may include:  Wearing a soft neck collar (cervical collar) for short periods of time, as told by your health care provider.  Doing physical therapy to strengthen your neck muscles.  Taking medicines, such as NSAIDs or oral corticosteroids.  Having spinal injections, in severe cases.  Having surgery. This may be needed if other treatments do not help. Different types of surgery may be done depending on the cause of this condition. Follow these instructions at home: If you have a cervical collar:  Wear it as told by your health care provider. Remove it only as told by your health care provider.  Ask your health care provider if you can remove the collar for cleaning and bathing. If you are allowed to remove the collar for cleaning or bathing: ? Follow instructions from your health care provider about how to remove the collar safely. ? Clean the collar by wiping it with mild soap and water and drying it completely. ? Take out any removable pads in the collar every 1-2 days, and wash them by hand with soap and water. Let them air-dry completely before you put them back in the collar. ? Check your skin under the collar for irritation or sores. If you see any, tell your health care provider. Managing pain  Take over-the-counter and prescription medicines only as told by your health care provider.  If directed, put ice on the affected area. ? If you have a soft neck collar, remove it as told by your health care provider. ? Put ice in a plastic bag. ? Place a towel between your skin and the bag. ? Leave the ice on for 20 minutes, 2-3 times a day.  If applying ice does not help, you can try using heat. Use the heat source that your health care provider recommends, such as a moist heat pack or a heating  pad. ? Place a towel between your skin and the heat source. ? Leave the heat on for 20-30 minutes. ? Remove the heat if your skin turns bright red. This is especially important if you are unable to feel pain, heat, or cold. You may have a greater risk of getting burned.  Try a gentle neck and shoulder massage to help relieve symptoms. Activity  Rest as needed.  Return to your normal activities as told by your health care provider. Ask your health care provider what activities are safe for you.  Do stretching and strengthening exercises as told by your health care provider or physical therapist.  Do not lift anything that is heavier than 10 lb (4.5 kg) until your health care provider tells you that it is safe. General instructions  Use a flat pillow when you sleep.  Do not drive while wearing a cervical collar. If you do not have a cervical collar, ask your health care provider if it is safe to drive while your neck heals.  Ask your health care provider if the medicine prescribed to you requires you to avoid driving or using heavy machinery.  Do not use any products that contain nicotine or tobacco, such as cigarettes, e-cigarettes, and chewing tobacco. These can delay healing. If you need help quitting, ask your health care provider.  Keep all follow-up visits as told by your health care provider. This is important. Contact a health care provider if:  Your condition does not improve with treatment. Get help right away if:  Your pain gets much worse and cannot be controlled with medicines.  You have weakness or numbness in your hand, arm, face, or leg.  You have a high fever.  You have a stiff, rigid neck.  You lose control of your bowels or your bladder (have incontinence).  You have trouble with walking, balance, or speaking. Summary  Cervical radiculopathy happens when a nerve in the neck is pinched or bruised.  A nerve can get pinched from a bulging disk, arthritis,  muscle spasms, or an injury to the neck.  Symptoms include pain, tingling, or numbness radiating from the neck into the arm or hand. Weakness can also occur in severe cases.  Treatment may include rest, wearing a cervical collar, and physical therapy. Medicines may be prescribed to help with pain. In severe cases, injections or surgery may be needed. This information is not intended to replace advice given to you by your health care provider. Make sure you discuss any questions you have with your health care provider. Document Released: 06/26/2001 Document Revised: 08/22/2018 Document Reviewed: 08/22/2018 Elsevier Patient Education  2020 [place] Headache Without Cause A headache is pain or discomfort felt around the head or neck area. The specific cause of a headache may not be found. There are many causes and types of headaches. A few common ones are:  Tension headaches.  Migraine  headaches.  Cluster headaches.  Chronic daily headaches. Follow these instructions at home: Watch your condition for any changes. Let your health care provider know about them. Take these steps to help with your condition: Managing pain      Take over-the-counter and prescription medicines only as told by your health care provider.  Lie down in a dark, quiet room when you have a headache.  If directed, put ice on your head and neck area: ? Put ice in a plastic bag. ? Place a towel between your skin and the bag. ? Leave the ice on for 20 minutes, 2-3 times per day.  If directed, apply heat to the affected area. Use the heat source that your health care provider recommends, such as a moist heat pack or a heating pad. ? Place a towel between your skin and the heat source. ? Leave the heat on for 20-30 minutes. ? Remove the heat if your skin turns bright red. This is especially important if you are unable to feel pain, heat, or cold. You may have a greater risk of getting burned.  Keep  lights dim if bright lights bother you or make your headaches worse. Eating and drinking  Eat meals on a regular schedule.  If you drink alcohol: ? Limit how much you use to:  0-1 drink a day for women.  0-2 drinks a day for men. ? Be aware of how much alcohol is in your drink. In the [place] one drink equals one 12 oz bottle of beer (355 mL), one 5 oz glass of wine (148 mL), or one 1 oz glass of hard liquor (44 mL).  Stop drinking caffeine, or decrease the amount of caffeine you drink. General instructions   Keep a headache journal to help find out what may trigger your headaches. For example, write down: ? What you eat and drink. ? How much sleep you get. ? Any change to your diet or medicines.  Try massage or other relaxation techniques.  Limit stress.  Sit up straight, and do not tense your muscles.  Do not use any products that contain nicotine or tobacco, such as cigarettes, e-cigarettes, and chewing tobacco. If you need help quitting, ask your health care provider.  Exercise regularly as told by your health care provider.  Sleep on a regular schedule. Get 7-9 hours of sleep each night, or the amount recommended by your health care provider.  Keep all follow-up visits as told by your health care provider. This is important. Contact a health care provider if:  Your symptoms are not helped by medicine.  You have a headache that is different from the usual headache.  You have nausea or you vomit.  You have a fever. Get help right away if:  Your headache becomes severe quickly.  Your headache gets worse after moderate to intense physical activity.  You have repeated vomiting.  You have a stiff neck.  You have a loss of vision.  You have problems with speech.  You have pain in the eye or ear.  You have muscular weakness or loss of muscle control.  You lose your balance or have trouble walking.  You feel faint or pass out.  You have confusion.  You  have a seizure. Summary  A headache is pain or discomfort felt around the head or neck area.  There are many causes and types of headaches. In some cases, the cause may not be found.  Keep a headache journal to help  find out what may trigger your headaches. Watch your condition for any changes. Let your health care provider know about them.  Contact a health care provider if you have a headache that is different from the usual headache, or if your symptoms are not helped by medicine.  Get help right away if your headache becomes severe, you vomit, you have a loss of vision, you lose your balance, or you have a seizure. This information is not intended to replace advice given to you by your health care provider. Make sure you discuss any questions you have with your health care provider. Document Released: 10/01/2005 Document Revised: 04/21/2018 Document Reviewed: 04/21/2018 Elsevier Patient Education  2020 [employer].

## 2019-05-22 NOTE — Assessment & Plan Note (Addendum)
Appears to have ice pick HA. No episodes today. She also has associated vertigo,  Tinnitus. Discussion of BPPV versus meniere's. Suspect cervical spine OA may be contributory as well.  We also discussed more concerning etiologies including CVA, aneurysm. Declines stat imaging today as she states unable to have tests today, and that she would like to have imaging down next week. Declines ENT referral. Emphasized that she needs to stay vigilant in regards to HA , presentation; if any new or worsening symptoms, she understands to go immediately to ED over the weekend.

## 2019-05-25 ENCOUNTER — Encounter: Payer: Self-pay | Admitting: Family

## 2019-06-02 ENCOUNTER — Ambulatory Visit: Payer: Medicare Other | Attending: Family

## 2019-06-02 ENCOUNTER — Other Ambulatory Visit: Payer: Self-pay

## 2019-06-02 DIAGNOSIS — M542 Cervicalgia: Secondary | ICD-10-CM | POA: Diagnosis present

## 2019-06-02 DIAGNOSIS — R293 Abnormal posture: Secondary | ICD-10-CM | POA: Diagnosis present

## 2019-06-02 NOTE — Patient Instructions (Signed)
  [employer] Access Code: [email or web address]  Chin tucks 10x3 with 5 second holds daily  Scapular retraction 10x3 with 5 second holds daily

## 2019-06-02 NOTE — Therapy (Signed)
[facility] [address] [place], [address] Phone: [phone]   Fax:  [phone]  Physical Therapy Evaluation  Patient Details  Name: Meghan Mack MRN: [id number] Date of Birth: 1951-08-16 Referring Provider (PT): Mable Paris, FNP   Encounter Date: 06/02/2019  PT End of Session - 06/02/19 0802    Visit Number  1    Number of Visits  13    Date for PT Re-Evaluation  07/16/19    Authorization Type  1    Authorization Time Period  of 10 Medicare    PT Start Time  0803    PT Stop Time  0903    PT Time Calculation (min)  60 min    Activity Tolerance  Patient tolerated treatment well    Behavior During Therapy  [facility] for tasks assessed/performed       Past Medical History:  Diagnosis Date  . Anemia   . Colonic polyp   . Constipation   . COPD (chronic obstructive pulmonary disease) ([place])   . Hyperlipidemia   . IBS (irritable bowel syndrome)   . Kidney cysts   . Lung nodule   . Meningioma ([place])   . Nausea   . Palpitations   . Tonsillar cyst     Past Surgical History:  Procedure Laterality Date  . CARDIAC CATHETERIZATION  02/25/2007  . COLONOSCOPY    . ESOPHAGOGASTRODUODENOSCOPY  10/1997 & 08/2005  . FOOT SURGERY  04/25/2007   right  . THYROIDECTOMY  11/2005  . VAGINAL HYSTERECTOMY     ovaries intact    There were no vitals filed for this visit.   Subjective Assessment - 06/02/19 0807    Subjective  Neck pain (bilateral upper trap area). 0/10 currently (head in neutral position), 5/10 at most for the past 3 months.    Pertinent History  Neck pain. 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 Pain would get better for a day. Feels a catch when she turns her head to the R or L. Also has a stabbing pain and sometimes feels dull towards the back of her head. Advil helps alleviate her head pain and pt is better able to turn her head each direction. Pt feels like the chiropractic treatment  made her neck and head pain worse. Neck pain began about a year ago, gradual onset since her chiropractic treatment for her neck. Having an MRI for her head tomorrow 06/03/2019.    Patient Stated Goals  To get the soreness out and be able to turn her head without getting the catch in it.    Currently in Pain?  No/denies    Pain Score  0-No pain   Head in neutral   Pain Location  Neck    Pain Orientation  Left;Right    Pain Descriptors / Indicators  Sharp;Stabbing;Dull    Pain Type  Chronic pain    Pain Onset  More than a month ago    Pain Frequency  Occasional    Aggravating Factors   Cervical rotation, cervical extension > cervical flexion    Pain Relieving Factors  Aleve, rest, heating pad         [facility] PT Assessment - 06/02/19 0806      Assessment   Medical Diagnosis  Neck pain    Referring Provider (PT)  Mable Paris, FNP    Onset Date/Surgical Date  05/22/19   Date PT referral signed. Chronic condition   Prior Therapy  Received chiropractic treatment which made her neck pain worse      Precautions   Precaution Comments  no known precautions      Restrictions   Other Position/Activity Restrictions  No known restrictions      Balance Screen   Has the patient fallen in the past 6 months  No    Has the patient had a decrease in activity level because of a fear of falling?   No    Is the patient reluctant to leave their home because of a fear of falling?   No      Prior Function   Vocation Requirements  PLOF: better able to look around with less neck pain.       Posture/Postural Control   Posture Comments  Protracted neck, L shoulder higher, B upper trap muscle tension, slight L lateral lean, kyphosis, backwards lean      AROM   Cervical Flexion  WFL    Cervical Extension  WFL, movement preference around C3/C4    Cervical - Right Side [facility] with R upper trap symptoms    Cervical - Left Side [facility] with L upper trap pain reprodction     Cervical - Right  Rotation  47 degrees with R upper trap pain reproduction    Cervical - Left Rotation  52 degrees with L upper trap pain reproduction      Strength   Right Shoulder Flexion  5/5    Right Shoulder ABduction  5/5    Left Shoulder Flexion  5/5    Left Shoulder ABduction  4+/5    Right Elbow Flexion  5/5    Right Elbow Extension  4+/5    Left Elbow Flexion  5/5    Left Elbow Extension  5/5    Right Wrist Extension  4+/5    Left Wrist Extension  4+/5      Palpation   Palpation comment  B upper trap muscle tension, B cervical paraspinal muscle tension L > R, slight L rotation of C5, C6 vertebrae                Objective measurements completed on examination: See above findings.   The patient has been informed of current processes in place at [facility] to protect patients from Covid-19 exposure including social distancing, schedule modifications, and new cleaning procedures. After discussing their particular risk with a therapist based on the patient's personal risk factors, the patient has decided to proceed with in-person therapy.    No latex band allergies  R hand dominant  No UE radiating symptoms  R posterior head sharp pain (greater occipital nerve area)        Medbridge Access Code: [email or web address]  Therapeutic exercise Supine chin tucks 10x5 seconds  Then with addition of B scapular retraction 10x5 seconds   Cervical rotation   R 60 degrees, L 50 degrees after exercise  Seated B scapular retraction 10x5 seconds  L upper trap symptoms during exercise, eases with rest  Decreased symptoms with cervical rotation after exercise  Reviewed HEP. Pt demonstrated and verbalized understanding. Handout provided.   Improved exercise technique, movement at target joints, use of target muscles after mod verbal, visual, tactile cues.    Reviewed plan of cafe: 2/xweek for 6 weeks if pt wants to continue after initial evaluation.       Manual therapy  Supine STM B  cervical paraspinal muscles and suboccipital muscles    Patient is a 68  year old female who came to physcial therapy secondary to neck pain. She also presents with poor posture, increased cervical paraspinal muscle tension, scapular and anterior cervical weakness, reproduction of pain with cervical AROM, and difficulty performing tasks which involve looking around. Pt will benefit from skilled physical therapy services to address the aforementioned deficits. Pt wants to try performing the HEP first prior to continuing with other follow up appointments. Informed pt that the current plan of care is for 2x/week for 6 weeks if she needs it and she can call back to schedule an appointment if she does. Pt verbalized understanding.     PT Education - 06/02/19 1808    Education Details  ther-ex, HEP, plan of care    Person(s) Educated  Patient    Methods  Explanation;Demonstration;Tactile cues;Verbal cues;Handout    Comprehension  Returned demonstration;Verbalized understanding       PT Short Term Goals - 06/02/19 1256      PT SHORT TERM GOAL #1   Title  Patient will be independent with her HEP to decrease pain, improve strength and function.    Baseline  Pt started her HEP (06/02/2019)    Time  3    Period  Weeks    Status  New    Target Date  06/25/19        PT Long Term Goals - 06/02/19 1729      PT LONG TERM GOAL #1   Title  Patient will have a decrease in neck pain to 2/10 or less at worst to promote ability to look around more comfortably.    Baseline  5/10 neck pain at worst for the past 3 months (06/02/2019)    Time  6    Period  Weeks    Status  New    Target Date  07/16/19      PT LONG TERM GOAL #2   Title  Patient will demonstrate equal R and L cervical rotation AROM without pain to promote ability to look around more comfortably    Baseline  Cervical rotation: 47 degrees R, 52 degrees L both with pain (06/02/2019)    Time  6    Period  Weeks    Status  New    Target Date   07/16/19             Plan - 06/02/19 1736    Clinical Impression Statement  Patient is a 68 year old female who came to physcial therapy secondary to neck pain. She also presents with poor posture, increased cervical paraspinal muscle tension, scapular and anterior cervical weakness, reproduction of pain with cervical AROM, and difficulty performing tasks which involve looking around. Pt will benefit from skilled physical therapy services to address the aforementioned deficits. Pt wants to try performing the HEP first prior to continuing with other follow up appointments. Informed pt that the current plan of care is for 2x/week for 6 weeks if she needs it and she can call back to schedule an appointment if she does. Pt verbalized understanding.    Personal Factors and Comorbidities  Age;Past/Current Experience;Time since onset of injury/illness/exacerbation    Examination-Activity Limitations  Other   looking around   Stability/Clinical Decision Making  Stable/Uncomplicated    Clinical Decision Making  Low    Rehab Potential  Fair    PT Frequency  2x / week    PT Duration  6 weeks    PT Treatment/Interventions  Therapeutic activities;Therapeutic exercise;Neuromuscular re-education;Patient/family education;Manual techniques;Dry needling;Joint Manipulations;Spinal  Manipulations;Aquatic Therapy;Electrical Stimulation;Iontophoresis 4mg /ml Dexamethasone;Traction   traction, manipulations if appropriate   PT Next Visit Plan  scapular strengthening, anterior cervical strengthening, posture, manual techniques, modalities PRN    PT Home Exercise Plan  Medbridge Access Code: [email or web address]    Consulted and Agree with Plan of Care  Patient       Patient will benefit from skilled therapeutic intervention in order to improve the following deficits and impairments:  Pain, Postural dysfunction, Improper body mechanics, Decreased strength, Decreased range of motion  Visit Diagnosis: 1. Cervicalgia   2.  Abnormal posture        Problem List Patient Active Problem List   Diagnosis Date Noted  . Headache 05/22/2019  . Neck pain 05/22/2019  . Change in bowel movement 10/03/2018  . Dysuria 07/09/2018  . Injection site reaction 07/09/2018  . Bug bite 07/09/2018  . Coronary artery calcification seen on CT scan 05/05/2018  . Aortic atherosclerosis ([place]) 05/05/2018  . Hematuria 10/21/2017  . Abnormal weight loss 10/21/2017  . Right hip pain 07/08/2017  . Snoring 08/07/2016  . Increased intraocular pressure 08/07/2016  . Routine physical examination 05/16/2016  . Medicare annual wellness visit, initial 06/27/2015  . Meningioma ([place]) 06/27/2015  . Breast nodule 10/19/2014  . Right knee pain 08/11/2012  . Screening for osteoporosis 08/11/2012  . Menopausal hot flushes 08/11/2012  . Tinnitus of left ear 01/23/2012  . Anxiety 01/23/2012  . Gallbladder polyp versus tiny stones on ultrasound 08/08/2011  . Tachycardia 02/24/2010  . Hyperlipidemia 08/19/2009  . Osteopenia 08/19/2009  . Obstructive sleep apnea 08/16/2008  . 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 VITAMIN D DEFICIENCY 04/14/2008  . DEGENERATIVE JOINT DISEASE, KNEE 04/14/2008  . HIP PAIN, RIGHT 10/07/2007  . Hypothyroidism 08/13/2007  . FIBROCYSTIC BREAST DISEASE 08/13/2007  . [place] SYNDROME 08/13/2007  . COLONIC POLYPS, HX OF 08/13/2007  . Paroxysmal supraventricular tachycardia ([place]) 03/18/2007    Joneen Boers PT, DPT   06/02/2019, 6:14 PM  [facility] [address] [place], [address] Phone: [phone]   Fax:  [phone]  Name: Meghan Mack MRN: [id number] Date of Birth: [birth year]

## 2019-06-03 ENCOUNTER — Ambulatory Visit
Admission: RE | Admit: 2019-06-03 | Discharge: 2019-06-03 | Disposition: A | Discharge status: Home / Self Care | Payer: Medicare Other | Source: Ambulatory Visit | Attending: Family | Admitting: Family

## 2019-06-03 ENCOUNTER — Other Ambulatory Visit: Payer: Self-pay

## 2019-06-03 DIAGNOSIS — R519 Headache, unspecified: Secondary | ICD-10-CM

## 2019-06-03 DIAGNOSIS — I6523 Occlusion and stenosis of bilateral carotid arteries: Secondary | ICD-10-CM | POA: Insufficient documentation

## 2019-06-03 DIAGNOSIS — R51 Headache: Secondary | ICD-10-CM | POA: Diagnosis present

## 2019-06-03 LAB — POCT I-STAT CREATININE: Creatinine, Ser: 0.7 mg/dL (ref 0.44–1.00)

## 2019-06-03 MED ORDER — GADOBUTROL 1 MMOL/ML IV SOLN
7.5000 mL | Freq: Once | INTRAVENOUS | Status: AC | PRN
  Administered 2019-06-03: 7.5 mL via INTRAVENOUS

## 2019-06-09 ENCOUNTER — Telehealth: Payer: Self-pay

## 2019-06-09 NOTE — Telephone Encounter (Signed)
Copied from [place] [id number]. Topic: Referral - Request for Referral >> Jun 09, 2019  9:54 AM Reyne Dumas L wrote: Has patient seen PCP for this complaint? yes *If NO, is insurance requiring patient see PCP for this issue before PCP can refer them? Referral for which specialty: imaging Preferred provider/office: no preference Reason for referral: ultrasound of plaque in arteries after MRI

## 2019-06-12 ENCOUNTER — Other Ambulatory Visit: Payer: Self-pay

## 2019-06-12 ENCOUNTER — Encounter: Payer: Self-pay | Admitting: Family

## 2019-06-12 ENCOUNTER — Ambulatory Visit (INDEPENDENT_AMBULATORY_CARE_PROVIDER_SITE_OTHER): Payer: Medicare Other | Admitting: Family

## 2019-06-12 DIAGNOSIS — I2510 Atherosclerotic heart disease of native coronary artery without angina pectoris: Secondary | ICD-10-CM | POA: Diagnosis not present

## 2019-06-12 DIAGNOSIS — R519 Headache, unspecified: Secondary | ICD-10-CM

## 2019-06-12 DIAGNOSIS — D329 Benign neoplasm of meninges, unspecified: Secondary | ICD-10-CM

## 2019-06-12 DIAGNOSIS — I6523 Occlusion and stenosis of bilateral carotid arteries: Secondary | ICD-10-CM

## 2019-06-12 DIAGNOSIS — R51 Headache: Secondary | ICD-10-CM | POA: Diagnosis not present

## 2019-06-12 NOTE — Patient Instructions (Addendum)
Epleys maneuveur - look into this online as would advise discussion with Dr Richardson Landry in regards to your vertigo.  Today we discussed referrals, orders. US carotid   I have placed these orders in the system for you.  Please be sure to give [place] a call if you have not heard from our office regarding this. We should hear from [place] within ONE week with information regarding your appointment. If not, please let me know immediately.   Please continue to follow and make appointments with Dr Tomi Likens and Dr Rockey Situ as we discussed today.  Let me know of ANY new or worsening symptoms.   Stay safe!

## 2019-06-12 NOTE — Progress Notes (Signed)
This visit type was conducted due to national recommendations for restrictions regarding the COVID-19 pandemic (e.g. social distancing).  This format is felt to be most appropriate for this patient at this time.  All issues noted in this document were discussed and addressed.  No physical exam was performed (except for noted visual exam findings with Video Visits). Virtual Visit via Video Note  I connected with@  on 06/15/19 at  3:00 PM EDT by a video enabled telemedicine application and verified that I am speaking with the correct person using two identifiers.  Location patient: home Location provider:work  Persons participating in the virtual visit: patient, provider  I discussed the limitations of evaluation and management by telemedicine and the availability of in person appointments. The patient expressed understanding and agreed to proceed.   HPI:  Feels well today. No new concerns.  HA- continues to have ice pick pains 'once and while.' Unchanged. 'doesn't feel worse.' Hasnt long HA episode which we discussed with last visit.  Notices with weather changes, such rain.   Continues to have vertigo , no increase in episodes, chronic tinnitus; established with Dr Richardson Landry. Not particularly bothersome.  On zocor.    Neck pain- improved. started PT   05/2019  MRI- no acute changes;unchanged 6 x 9 mm meningioma MRA-no aneurysm;   Seeing Chipper Herb next month for trembling.   H/o meningioma 2015-Following with [facility] neurology next month for trembling however in the past has followed with Dr Tomi Likens whom she plans to follow up with in particular with the meningioma.   ROS: See pertinent positives and negatives per HPI.  Past Medical History:  Diagnosis Date  . Anemia   . Colonic polyp   . Constipation   . COPD (chronic obstructive pulmonary disease) ([place])   . Hyperlipidemia   . IBS (irritable bowel syndrome)   . Kidney cysts   . Lung nodule   . Meningioma ([place])   . Nausea    . Palpitations   . Tonsillar cyst     Past Surgical History:  Procedure Laterality Date  . CARDIAC CATHETERIZATION  02/25/2007  . COLONOSCOPY    . ESOPHAGOGASTRODUODENOSCOPY  10/1997 & 08/2005  . FOOT SURGERY  04/25/2007   right  . THYROIDECTOMY  11/2005  . VAGINAL HYSTERECTOMY     ovaries intact    Family History  Problem Relation Age of Onset  . Cancer Mother        colon  . Emphysema Father   . Heart disease Father   . Cancer Father        leukemia  . Cancer Brother        lung  . Cancer Maternal Aunt        melanoma  . Cancer Maternal Grandfather        stomach, colon  . Breast cancer Neg Hx   . Kidney cancer Neg Hx   . Kidney disease Neg Hx   . Prostate cancer Neg Hx     SOCIAL HX: former smoker   Current Outpatient Medications:  .  ergocalciferol (VITAMIN D2) 50000 UNITS capsule, Take 50,000 Units by mouth every 30 (thirty) days., Disp: , Rfl:  .  ibuprofen (ADVIL,MOTRIN) 600 MG tablet, Take 1 tablet (600 mg total) by mouth every 8 (eight) hours as needed., Disp: 20 tablet, Rfl: 0 .  levothyroxine (SYNTHROID, LEVOTHROID) 88 MCG tablet, Take 1 tablet 6 days a week., Disp: , Rfl:  .  Multiple Vitamin (MULTIVITAMIN PO), Take 1 tablet by mouth  daily.  , Disp: , Rfl:  .  nebivolol (BYSTOLIC) 10 MG tablet, Take 0.5 tablets (5 mg total) by mouth 2 (two) times daily., Disp: 90 tablet, Rfl: 3 .  nebivolol (BYSTOLIC) 5 MG tablet, Take 1 tablet (5 mg total) by mouth daily as needed. In the evening with evening dose of Bystolic., Disp: 30 tablet, Rfl: 6 .  simvastatin (ZOCOR) 20 MG tablet, TAKE 1 TABLET (20 MG) EVERY EVENING (IMPROVES CHOLESTEROL), Disp: 90 tablet, Rfl: 3  EXAM:  VITALS per patient if applicable:  GENERAL: alert, oriented, appears well and in no acute distress  HEENT: atraumatic, conjunttiva clear, no obvious abnormalities on inspection of external nose and ears  NECK: normal movements of the head and neck  LUNGS: on inspection no signs of  respiratory distress, breathing rate appears normal, no obvious gross SOB, gasping or wheezing  CV: no obvious cyanosis  MS: moves all visible extremities without noticeable abnormality  PSYCH/NEURO: pleasant and cooperative, no obvious depression or anxiety, speech and thought processing grossly intact  ASSESSMENT AND PLAN:  Discussed the following assessment and plan:  Problem List Items Addressed This Visit      Cardiovascular and Mediastinum   Coronary artery calcification seen on CT scan    She is on zocor. Pending US carotid; advised to continue to follow with Dr Rockey Situ. She verbalized understanding of all.         Nervous and Auditory   Meningioma ([facility])    Appears unchanged. She understands to make a follow up with Dr Tomi Likens. Will follow.        Other   Headache    Unchanged. Discussed results of MRI/MRA brain. Pleased neck pain has improved with PT as had thought this may contributory to HA. Advised to continue following with Dr Tomi Likens, particularly with h/o meningioma,  of which patient stated she would call to make a follow up appointment. I have also sent him a message for advice on management. Discussed as well the role of vertigo and potential DXX such as BPPV. She is established with Dr Richardson Landry and declines referral as she will contact him for an appointment.       Other Visit Diagnoses    Bilateral carotid artery stenosis    -  Primary   Relevant Orders   US Carotid Duplex Bilateral        I discussed the assessment and treatment plan with the patient. The patient was provided an opportunity to ask questions and all were answered. The patient agreed with the plan and demonstrated an understanding of the instructions.   The patient was advised to call back or seek an in-person evaluation if the symptoms worsen or if the condition fails to improve as anticipated.   Mable Paris, FNP

## 2019-06-15 ENCOUNTER — Encounter: Payer: Self-pay | Admitting: Family

## 2019-06-15 NOTE — Assessment & Plan Note (Addendum)
Unchanged. Discussed results of MRI/MRA brain. Pleased neck pain has improved with PT as had thought this may contributory to HA. Advised to continue following with Dr Tomi Likens, particularly with h/o meningioma,  of which patient stated she would call to make a follow up appointment. I have also sent him a message for advice on management. Discussed as well the role of vertigo and potential DXX such as BPPV. She is established with Dr Richardson Landry and declines referral as she will contact him for an appointment.

## 2019-06-15 NOTE — Assessment & Plan Note (Signed)
Appears unchanged. She understands to make a follow up with Dr Tomi Likens. Will follow.

## 2019-06-15 NOTE — Assessment & Plan Note (Addendum)
She is on zocor. Pending US carotid; advised to continue to follow with Dr Rockey Situ. She verbalized understanding of all.

## 2019-06-16 ENCOUNTER — Other Ambulatory Visit
Admission: RE | Admit: 2019-06-16 | Discharge: 2019-06-16 | Disposition: A | Discharge status: Home / Self Care | Payer: Medicare Other | Source: Ambulatory Visit | Attending: Cardiovascular Disease | Admitting: Cardiovascular Disease

## 2019-06-16 DIAGNOSIS — Z79899 Other long term (current) drug therapy: Secondary | ICD-10-CM | POA: Insufficient documentation

## 2019-06-16 DIAGNOSIS — E782 Mixed hyperlipidemia: Secondary | ICD-10-CM | POA: Diagnosis present

## 2019-06-16 LAB — LIPID PANEL
Cholesterol: 153 mg/dL (ref 0–200)
HDL: 75 mg/dL (ref >40)
LDL Cholesterol: 64 mg/dL (ref 0–99)
Total CHOL/HDL Ratio: 2 RATIO
Triglycerides: 68 mg/dL (ref <150)
VLDL: 14 mg/dL (ref 0–40)

## 2019-06-16 NOTE — Progress Notes (Signed)
I spoke with patient & she stated that she would actually call Dr. Georgie Chard office after speaking with me to make an appointment. She also stated that she had not heard anything yet in regards to getting [place] scheduled that was ordered. Can you check on this Melissa? Thanks in advance.

## 2019-06-17 ENCOUNTER — Telehealth: Payer: Self-pay | Admitting: Family

## 2019-06-17 NOTE — Telephone Encounter (Signed)
Call pt I rec'ed a nice note from Dr Tomi Likens whom did not feel any findings on MRI/MRA   would be causing the headache. Meningioma looks stable.   He also agreed with carotid doppler, optimizing management of stroke risk factors.   Please advise patient to make a f/u appt with him if she has not done so already to discuss nature of HA.

## 2019-06-22 NOTE — Progress Notes (Signed)
Cardiology Office Note  Date:  06/23/2019   ID:  Meghan, Mack [birth year], MRN [id number]  PCP:  Burnard Hawthorne, FNP   Chief Complaint  Patient presents with  . other    12 mo f/u. Medications reviewed verbally.     HPI:  [name] is a very pleasant 68 year old woman with a history of  palpitations,  anxiety,  remote history of chest pain,  hyperlipidemia  Prior 30 day monitor in 2014 showed APCs and short runs of atrial tachycardia/SVT with rate 166 bpm hypertension  mild coronary calcification of the LAD and RCA on CT who presents for routine followup  Of her palpitations and tachycardia.  MRI, was having sharp pain in head Results discussed with her in detail, images pulled up and reviewed with her Moderate stenosis right P2 segment Moderate to severe stenosis left internal carotid artery at the skull base and moderate stenosis right internal carotid artery at the skull base. Mild atherosclerotic disease right anterior cerebral Artery.  zetia stomach cramps on zocor 20 daily Tolerating simvastatin 20 LDL above goal  Rare tachycardia once a week, At rest, <15 sec Currently takes bystolic 2.5 mg in the morning Blood pressure stable  Denies any chest pain concerning for angina  Prior CT chest She has had mild coronary calcification of the LAD and RCA diffuse mild aortic atherosclerosis  Wears compression hose  Lab work discussed total cholesterol 150, LDL 70  EKG personally reviewed by myself on todays visit shows normal sinus rhythm with rate 73 bpm, no significant ST or T-wave changes    PMH:   has a past medical history of Anemia, Colonic polyp, Constipation, COPD (chronic obstructive pulmonary disease) ([place]), Hyperlipidemia, IBS (irritable bowel syndrome), Kidney cysts, Lung nodule, Meningioma ([facility]), Nausea, Palpitations, and Tonsillar cyst.  PSH:    Past Surgical History:  Procedure Laterality Date  . CARDIAC CATHETERIZATION   02/25/2007  . COLONOSCOPY    . ESOPHAGOGASTRODUODENOSCOPY  10/1997 & 08/2005  . FOOT SURGERY  04/25/2007   right  . THYROIDECTOMY  11/2005  . VAGINAL HYSTERECTOMY     ovaries intact    Current Outpatient Medications  Medication Sig Dispense Refill  . ergocalciferol (VITAMIN D2) 50000 UNITS capsule Take 50,000 Units by mouth every 30 (thirty) days.    Marland Kitchen ibuprofen (ADVIL,MOTRIN) 600 MG tablet Take 1 tablet (600 mg total) by mouth every 8 (eight) hours as needed. 20 tablet 0  . levothyroxine (SYNTHROID, LEVOTHROID) 88 MCG tablet Take 1 tablet 6 days a week.    . Multiple Vitamin (MULTIVITAMIN PO) Take 1 tablet by mouth daily.      . nebivolol (BYSTOLIC) 10 MG tablet Take 0.5 tablets (5 mg total) by mouth 2 (two) times daily. 90 tablet 3  . nebivolol (BYSTOLIC) 5 MG tablet Take 1 tablet (5 mg total) by mouth daily as needed. In the evening with evening dose of Bystolic. 30 tablet 6  . simvastatin (ZOCOR) 20 MG tablet TAKE 1 TABLET (20 MG) EVERY EVENING (IMPROVES CHOLESTEROL) 90 tablet 3   No current facility-administered medications for this visit.      Allergies:   Tdap [tetanus-diphth-acell pertussis], Codeine, Contrast media [iodinated diagnostic agents], Diflucan [fluconazole], Esomeprazole magnesium, Meperidine hcl, Pneumovax 23 [pneumococcal vac polyvalent], Propantheline bromide, Propoxyphene n-acetaminophen, Sulfa antibiotics, and Sulfonamide derivatives   Social History:  The patient  reports that she quit smoking about 21 years ago. Her smoking use included cigarettes. She has a 2.50 pack-year smoking history. She has never used  smokeless tobacco. She reports that she does not drink alcohol or use drugs.   Family History:   family history includes Cancer in her brother, father, maternal aunt, maternal grandfather, and mother; Emphysema in her father; Heart disease in her father.    Review of Systems: Review of Systems  Constitutional: Negative.   Respiratory: Negative.    Cardiovascular: Negative.   Gastrointestinal: Negative.   Musculoskeletal: Negative.   Neurological: Negative.   Psychiatric/Behavioral: Negative.   All other systems reviewed and are negative.    PHYSICAL EXAM: VS:  BP 124/78 (BP Location: Left Arm, Patient Position: Sitting, Cuff Size: Normal)   Pulse 73   Ht 5\' 7"  (1.702 m)   Wt 146 lb (66.2 kg)   SpO2 96%   BMI 22.87 kg/m  , BMI Body mass index is 22.87 kg/m. Constitutional:  oriented to person, place, and time. No distress.  HENT:  Head: Grossly normal Eyes:  no discharge. No scleral icterus.  Neck: No JVD, no carotid bruits  Cardiovascular: Regular rate and rhythm, no murmurs appreciated Pulmonary/Chest: Clear to auscultation bilaterally, no wheezes or rails Abdominal: Soft.  no distension.  no tenderness.  Musculoskeletal: Normal range of motion Neurological:  normal muscle tone. Coordination normal. No atrophy Skin: Skin warm and dry Psychiatric: normal affect, pleasant   Recent Labs: 07/09/2018: ALT 22; BUN 9; Potassium 4.1; Sodium 140; TSH 2.76 10/03/2018: Hemoglobin 13.0; Platelets 215.0 06/03/2019: Creatinine, Ser 0.70    Lipid Panel Lab Results  Component Value Date   CHOL 153 06/16/2019   HDL 75 06/16/2019   LDLCALC 64 06/16/2019   TRIG 68 06/16/2019      Wt Readings from Last 3 Encounters:  06/23/19 146 lb (66.2 kg)  10/03/18 149 lb 6.4 oz (67.8 kg)  09/23/18 149 lb (67.6 kg)      ASSESSMENT AND PLAN:  Peripheral arterial disease Significant carotid stenosis per MRI Surprising finding given very mild coronary calcification, aortic atherosclerosis Cholesterol close to goal She is scheduled for ultrasound carotid tomorrow for verification  Tachycardia - Plan: EKG 12-Lead Previous event monitor showing 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  Continue bystolic 5 daily Could increase to 10 for worsening symptoms but only happens once a week less than 10 to 15 seconds at a  time   Obstructive sleep apnea - Plan: EKG 12-Lead  TOBACCO ABUSE - Plan: EKG 12-Lead Prior history of smoking A current nonsmoker Stressed importance of smoking cessation  Mixed hyperlipidemia - Plan: EKG 12-Lead Long discussion concerning lipid management She is indicated she may want to change statins to make it stronger Did not tolerate Zetia Suggested we wait until after her carotid ultrasound tomorrow for verification of disease.  Potentially could change to Crestor or go up on the simvastatin if she chooses  CAD mildcoronary calcification and mild to moderate diffuse aortic atherosclerosis Continue aggressive lipid management As above   Disposition:   F/U  12 months  Total encounter time more than 25 minutes  Greater than 50% was spent in counseling and coordination of care with the patient    Orders Placed This Encounter  Procedures  . EKG 12-Lead     Signed, Esmond Plants, M.D., Ph.D. 06/23/2019  [place], [place]

## 2019-06-23 ENCOUNTER — Encounter: Payer: Self-pay | Admitting: Cardiovascular Disease

## 2019-06-23 ENCOUNTER — Other Ambulatory Visit: Payer: Self-pay

## 2019-06-23 ENCOUNTER — Ambulatory Visit (INDEPENDENT_AMBULATORY_CARE_PROVIDER_SITE_OTHER): Payer: Medicare Other | Admitting: Cardiovascular Disease

## 2019-06-23 VITALS — BP 124/78 | HR 73 | Ht 67.0 in | Wt 146.0 lb

## 2019-06-23 DIAGNOSIS — I471 Supraventricular tachycardia: Secondary | ICD-10-CM | POA: Diagnosis not present

## 2019-06-23 DIAGNOSIS — I700 Atherosclerosis of aorta: Secondary | ICD-10-CM

## 2019-06-23 DIAGNOSIS — I739 Peripheral vascular disease, unspecified: Secondary | ICD-10-CM

## 2019-06-23 DIAGNOSIS — G4733 Obstructive sleep apnea (adult) (pediatric): Secondary | ICD-10-CM

## 2019-06-23 DIAGNOSIS — E782 Mixed hyperlipidemia: Secondary | ICD-10-CM

## 2019-06-23 DIAGNOSIS — F17200 Nicotine dependence, unspecified, uncomplicated: Secondary | ICD-10-CM

## 2019-06-23 DIAGNOSIS — I2510 Atherosclerotic heart disease of native coronary artery without angina pectoris: Secondary | ICD-10-CM

## 2019-06-23 NOTE — Patient Instructions (Addendum)

## 2019-06-24 ENCOUNTER — Ambulatory Visit
Admission: RE | Admit: 2019-06-24 | Discharge: 2019-06-24 | Disposition: A | Discharge status: Home / Self Care | Payer: Medicare Other | Source: Ambulatory Visit | Attending: Family | Admitting: Family

## 2019-06-24 ENCOUNTER — Other Ambulatory Visit: Payer: Self-pay

## 2019-06-24 DIAGNOSIS — I6523 Occlusion and stenosis of bilateral carotid arteries: Secondary | ICD-10-CM | POA: Insufficient documentation

## 2019-06-24 NOTE — Telephone Encounter (Signed)
US done today

## 2019-06-26 ENCOUNTER — Ambulatory Visit: Payer: Medicare Other | Admitting: Family

## 2019-06-29 ENCOUNTER — Ambulatory Visit: Payer: Medicare Other

## 2019-06-29 NOTE — Progress Notes (Addendum)
Virtual Visit via Video Note The purpose of this virtual visit is to provide medical care while limiting exposure to the novel coronavirus.    Consent was obtained for video visit:  Yes Answered questions that patient had about telehealth interaction:  Yes I discussed the limitations, risks, security and privacy concerns of performing an evaluation and management service by telemedicine. I also discussed with the patient that there may be a patient responsible charge related to this service. The patient expressed understanding and agreed to proceed.  Pt location: Home Physician Location: Office Name of referring provider:  Burnard Hawthorne, FNP I connected with Meghan Mack at patients initiation/request on 07/01/2019 at  9:50 AM EDT by video enabled telemedicine application and verified that I am speaking with the correct person using two identifiers. Pt MRN:  [id number] Pt DOB:  June 12, 1951 Video Participants:  Meghan Mack   History of Present Illness:  Meghan Mack is a 68 year old right-handed female with COPD, IBS, anxiety, Sjogren's, hypothyroidism and hyperlipidemia and meningioma who follows up for new issue, headache.    UPDATE: She has had headaches for many years off and on since around age 21.  It is a moderate to severe paroxysmal sharp pain, like a lightening bolt, brief and occurs in various areas over her head.  It can occur once or several times.  It came back in August.  They occurred off and on for 3 days in a row.  It seems to occur during rainy weather. Since then, she has had 1 or 2 spells also during rainy weather.  Separately, she has episodes of visual disturbance, in which she sees a circle surrounded by bright light in her right eye when she walks out into a bright environment (like walking outside in the sun.  It typically lasts a few seconds and occurs infrequently.  Also, if she looks at a checkered pattern, the lines start jumping.    No prior  history of headaches.  MRI of brain with and without contrast on 06/05/19 showed stable 6 x 9 mm meningioma along the anterior tentorium.  MRA of head showed intracranial atheroclerosis with stenosis involving the right P2 segment and left ICA at skull base.  Carotid doppler from 06/24/19 showed no hemodynamically significant ICA stenosis and demonstrated antegrade flow in both vertebral arteries.  Current NSAIDS:  ibuprofen Current Antihypertensive medications:  Bystolic Current Vitamins/Herbal/Supplements:  D Other medications:  Simvastatin 20mg , Synthroid    Past Medical History: Past Medical History:  Diagnosis Date  . Anemia   . Colonic polyp   . Constipation   . COPD (chronic obstructive pulmonary disease) ([place])   . Hyperlipidemia   . IBS (irritable bowel syndrome)   . Kidney cysts   . Lung nodule   . Meningioma ([place])   . Nausea   . Palpitations   . Tonsillar cyst     Medications: Outpatient Encounter Medications as of 07/01/2019  Medication Sig  . ergocalciferol (VITAMIN D2) 50000 UNITS capsule Take 50,000 Units by mouth every 30 (thirty) days.  Marland Kitchen ibuprofen (ADVIL,MOTRIN) 600 MG tablet Take 1 tablet (600 mg total) by mouth every 8 (eight) hours as needed.  Marland Kitchen levothyroxine (SYNTHROID, LEVOTHROID) 88 MCG tablet Take 1 tablet 6 days a week.  . Multiple Vitamin (MULTIVITAMIN PO) Take 1 tablet by mouth daily.    . nebivolol (BYSTOLIC) 10 MG tablet Take 0.5 tablets (5 mg total) by mouth 2 (two) times daily.  . nebivolol (BYSTOLIC) 5 MG  tablet Take 1 tablet (5 mg total) by mouth daily as needed. In the evening with evening dose of Bystolic.  . simvastatin (ZOCOR) 20 MG tablet TAKE 1 TABLET (20 MG) EVERY EVENING (IMPROVES CHOLESTEROL)   No facility-administered encounter medications on file as of 07/01/2019.     Allergies: Allergies  Allergen Reactions  . Tdap [Tetanus-Diphth-Acell Pertussis] Swelling and Rash  . Codeine   . Contrast Media [Iodinated Diagnostic Agents]      gabastat used 05/2019 for MRI/MRA made heart race.   . Diflucan [Fluconazole] Hives  . Esomeprazole Magnesium     REACTION: nausea  . Meperidine Hcl     REACTION: Causes heart to race  . Pneumovax 23 [Pneumococcal Vac Polyvalent]     Extensive local reaction with arm swelling and induration  . Propantheline Bromide   . Propoxyphene N-Acetaminophen     REACTION: heart race  . Sulfa Antibiotics Other (See Comments)  . Sulfonamide Derivatives     Family History: Family History  Problem Relation Age of Onset  . Cancer Mother        colon  . Emphysema Father   . Heart disease Father   . Cancer Father        leukemia  . Cancer Brother        lung  . Cancer Maternal Aunt        melanoma  . Cancer Maternal Grandfather        stomach, colon  . Breast cancer Neg Hx   . Kidney cancer Neg Hx   . Kidney disease Neg Hx   . Prostate cancer Neg Hx     Social History: Social History   Socioeconomic History  . Marital status: Married    Spouse name: Not on file  . Number of children: 2  . Years of education: Not on file  . Highest education level: Not on file  Occupational History  . Occupation: Metallurgist: [employer]  . Financial resource strain: Not hard at all  . Food insecurity    Worry: Never true    Inability: Never true  . Transportation needs    Medical: No    Non-medical: No  Tobacco Use  . Smoking status: Former Smoker    Packs/day: 0.25    Years: 10.00    Pack years: 2.50    Types: Cigarettes    Quit date: 09/25/1997    Years since quitting: 21.7  . Smokeless tobacco: Never Used  Substance and Sexual Activity  . Alcohol use: No    Alcohol/week: 1.0 standard drinks    Types: 1 Standard drinks or equivalent per week  . Drug use: No  . Sexual activity: Yes    Partners: Male    Birth control/protection: Surgical  Lifestyle  . Physical activity    Days per week: 3 days    Minutes per session: 40 min  . Stress: Only a  little  Relationships  . Social Herbalist on phone: Not on file    Gets together: Not on file    Attends religious service: Not on file    Active member of club or organization: Not on file    Attends meetings of clubs or organizations: Not on file    Relationship status: Not on file  . Intimate partner violence    Fear of current or ex partner: No    Emotionally abused: No    Physically abused: No  Forced sexual activity: No  Other Topics Concern  . Not on file  Social History Narrative   Lives with husband in [place], has 2 children and 2 step-children. No pets.      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       Diet - regular   Exercise - walks 5 days per week   1.5 years of college    Observations/Objective:   Height 5\' 7"  (1.702 m), weight 146 lb (66.2 kg). No acute distress.  Alert and oriented.  Speech fluent and not dysarthric.  Language intact.  Eyes orthophoric on primary gaze.  Face symmetric.  Assessment and Plan:   1.  Primary stabbing headache 2.  Ocular migraines 3.  Intracranial stenosis 4.  Cerebral meningioma  1.  Consider melatonin 3 to 12mg  daily to help reduce frequency.  Treat with Advil as needed. 2.  Given the findings of the arterial stenosis, advised starting ASA 81mg  daily 3.  Repeat MRA of head in 5 to 10 years. 4.  Follow up in 6 months  Follow Up Instructions:    -I discussed the assessment and treatment plan with the patient. The patient was provided an opportunity to ask questions and all were answered. The patient agreed with the plan and demonstrated an understanding of the instructions.   The patient was advised to call back or seek an in-person evaluation if the symptoms worsen or if the condition fails to improve as anticipated.    Total Time spent in visit with the patient was:  15 minutes   Dudley Major, DO

## 2019-07-01 ENCOUNTER — Encounter: Payer: Self-pay | Admitting: Neurology

## 2019-07-01 ENCOUNTER — Other Ambulatory Visit: Payer: Self-pay

## 2019-07-01 ENCOUNTER — Telehealth (INDEPENDENT_AMBULATORY_CARE_PROVIDER_SITE_OTHER): Payer: Medicare Other | Admitting: Neurology

## 2019-07-01 VITALS — Ht 67.0 in | Wt 146.0 lb

## 2019-07-01 DIAGNOSIS — I679 Cerebrovascular disease, unspecified: Secondary | ICD-10-CM

## 2019-07-01 DIAGNOSIS — G4485 Primary stabbing headache: Secondary | ICD-10-CM | POA: Diagnosis not present

## 2019-07-01 DIAGNOSIS — D329 Benign neoplasm of meninges, unspecified: Secondary | ICD-10-CM

## 2019-07-01 DIAGNOSIS — G43109 Migraine with aura, not intractable, without status migrainosus: Secondary | ICD-10-CM

## 2019-07-09 ENCOUNTER — Other Ambulatory Visit: Payer: Self-pay | Admitting: *Deleted

## 2019-07-09 MED ORDER — NEBIVOLOL HCL 10 MG PO TABS: 5.0000 mg | ORAL_TABLET | Freq: Two times a day (BID) | ORAL | 3 refills | Status: DC

## 2019-07-21 LAB — HM COLONOSCOPY

## 2019-08-06 ENCOUNTER — Other Ambulatory Visit: Payer: Self-pay | Admitting: Family

## 2019-08-06 DIAGNOSIS — Z1231 Encounter for screening mammogram for malignant neoplasm of breast: Secondary | ICD-10-CM

## 2019-08-12 ENCOUNTER — Encounter: Payer: Self-pay | Admitting: Internal Medicine

## 2019-08-12 ENCOUNTER — Other Ambulatory Visit: Payer: Self-pay

## 2019-08-12 ENCOUNTER — Ambulatory Visit (INDEPENDENT_AMBULATORY_CARE_PROVIDER_SITE_OTHER): Payer: Medicare Other | Admitting: Internal Medicine

## 2019-08-12 VITALS — Ht 67.5 in | Wt 146.0 lb

## 2019-08-12 DIAGNOSIS — M3500 Sicca syndrome, unspecified: Secondary | ICD-10-CM | POA: Diagnosis not present

## 2019-08-12 DIAGNOSIS — R937 Abnormal findings on diagnostic imaging of other parts of musculoskeletal system: Secondary | ICD-10-CM | POA: Diagnosis not present

## 2019-08-12 DIAGNOSIS — M2550 Pain in unspecified joint: Secondary | ICD-10-CM

## 2019-08-12 DIAGNOSIS — Z1329 Encounter for screening for other suspected endocrine disorder: Secondary | ICD-10-CM | POA: Diagnosis not present

## 2019-08-12 DIAGNOSIS — M5416 Radiculopathy, lumbar region: Secondary | ICD-10-CM

## 2019-08-12 NOTE — Patient Instructions (Signed)
Consider Dr. Lacinda Axon neurosurgery vs PM&R Dr. Sharlet Salina for injections of steroid to help with your symptoms 1st then neurosurgery Consider physical therapy Lidocaine pain patches over the counter  Heat and back stretches As needed Tylenol 500 mg for pain no more than 3000 mg total in 1 day     Back Exercises The following exercises strengthen the muscles that help to support the trunk and back. They also help to keep the lower back flexible. Doing these exercises can help to prevent back pain or lessen existing pain.  If you have back pain or discomfort, try doing these exercises 2-3 times each day or as told by your health care provider.  As your pain improves, do them once each day, but increase the number of times that you repeat the steps for each exercise (do more repetitions).  To prevent the recurrence of back pain, continue to do these exercises once each day or as told by your health care provider. Do exercises exactly as told by your health care provider and adjust them as directed. It is normal to feel mild stretching, pulling, tightness, or discomfort as you do these exercises, but you should stop right away if you feel sudden pain or your pain gets worse. Exercises Single knee to chest Repeat these steps 3-5 times for each leg: 1. Lie on your back on a firm bed or the floor with your legs extended. 2. Bring one knee to your chest. Your other leg should stay extended and in contact with the floor. 3. Hold your knee in place by grabbing your knee or thigh with both hands and hold. 4. Pull on your knee until you feel a gentle stretch in your lower back or buttocks. 5. Hold the stretch for 10-30 seconds. 6. Slowly release and straighten your leg. Pelvic tilt Repeat these steps 5-10 times: 1. Lie on your back on a firm bed or the floor with your legs extended. 2. Bend your knees so they are pointing toward the ceiling and your feet are flat on the floor. 3. Tighten your lower  abdominal muscles to press your lower back against the floor. This motion will tilt your pelvis so your tailbone points up toward the ceiling instead of pointing to your feet or the floor. 4. With gentle tension and even breathing, hold this position for 5-10 seconds. Cat-cow Repeat these steps until your lower back becomes more flexible: 1. Get into a hands-and-knees position on a firm surface. Keep your hands under your shoulders, and keep your knees under your hips. You may place padding under your knees for comfort. 2. Let your head hang down toward your chest. Contract your abdominal muscles and point your tailbone toward the floor so your lower back becomes rounded like the back of a cat. 3. Hold this position for 5 seconds. 4. Slowly lift your head, let your abdominal muscles relax and point your tailbone up toward the ceiling so your back forms a sagging arch like the back of a cow. 5. Hold this position for 5 seconds.  Press-ups Repeat these steps 5-10 times: 1. Lie on your abdomen (face-down) on the floor. 2. Place your palms near your head, about shoulder-width apart. 3. Keeping your back as relaxed as possible and keeping your hips on the floor, slowly straighten your arms to raise the top half of your body and lift your shoulders. Do not use your back muscles to raise your upper torso. You may adjust the placement of your hands to  make yourself more comfortable. 4. Hold this position for 5 seconds while you keep your back relaxed. 5. Slowly return to lying flat on the floor.  Bridges Repeat these steps 10 times: 1. Lie on your back on a firm surface. 2. Bend your knees so they are pointing toward the ceiling and your feet are flat on the floor. Your arms should be flat at your sides, next to your body. 3. Tighten your buttocks muscles and lift your buttocks off the floor until your waist is at almost the same height as your knees. You should feel the muscles working in your  buttocks and the back of your thighs. If you do not feel these muscles, slide your feet 1-2 inches farther away from your buttocks. 4. Hold this position for 3-5 seconds. 5. Slowly lower your hips to the starting position, and allow your buttocks muscles to relax completely. If this exercise is too easy, try doing it with your arms crossed over your chest. Abdominal crunches Repeat these steps 5-10 times: 1. Lie on your back on a firm bed or the floor with your legs extended. 2. Bend your knees so they are pointing toward the ceiling and your feet are flat on the floor. 3. Cross your arms over your chest. 4. Tip your chin slightly toward your chest without bending your neck. 5. Tighten your abdominal muscles and slowly raise your trunk (torso) high enough to lift your shoulder blades a tiny bit off the floor. Avoid raising your torso higher than that because it can put too much stress on your low back and does not help to strengthen your abdominal muscles. 6. Slowly return to your starting position. Back lifts Repeat these steps 5-10 times: 1. Lie on your abdomen (face-down) with your arms at your sides, and rest your forehead on the floor. 2. Tighten the muscles in your legs and your buttocks. 3. Slowly lift your chest off the floor while you keep your hips pressed to the floor. Keep the back of your head in line with the curve in your back. Your eyes should be looking at the floor. 4. Hold this position for 3-5 seconds. 5. Slowly return to your starting position. Contact a health care provider if:  Your back pain or discomfort gets much worse when you do an exercise.  Your worsening back pain or discomfort does not lessen within 2 hours after you exercise. If you have any of these problems, stop doing these exercises right away. Do not do them again unless your health care provider says that you can. Get help right away if:  You develop sudden, severe back pain. If this happens, stop  doing the exercises right away. Do not do them again unless your health care provider says that you can. This information is not intended to replace advice given to you by your health care provider. Make sure you discuss any questions you have with your health care provider. Document Released: 11/08/2004 Document Revised: 02/05/2019 Document Reviewed: 07/03/2018 Elsevier Patient Education  2020 [employer].

## 2019-08-12 NOTE — Progress Notes (Signed)
Telephone Note  I connected with Meghan Mack   on 08/12/19 at 11:50 AM EDT by a telephone and verified that I am speaking with the correct person using two identifiers.  Location patient: home Location provider:work or home office Persons participating in the virtual visit: patient, provider  I discussed the limitations of evaluation and management by telemedicine and the availability of in person appointments. The patient expressed understanding and agreed to proceed.   HPI: 1. C/o right hip groin pain >left and leg pain with worse with walking or standing improved with sitting reviewed MRI from 2017 abnormal with b/l facet degeneration, 3 mm anterolisthesis, mild bulging disc L5-S1 shallow bulging disc and h/o spondylosis L4/5. No pain with sitting still. Recently pain was 9/10 nothing tried   2. H/o sjogrens does not f/u with anyone for this   ROS: See pertinent positives and negatives per HPI.  Past Medical History:  Diagnosis Date  . Anemia   . Colonic polyp   . Constipation   . COPD (chronic obstructive pulmonary disease) ([place])   . Hyperlipidemia   . IBS (irritable bowel syndrome)   . Kidney cysts   . Lung nodule   . Meningioma ([place])   . Nausea   . Palpitations   . Tonsillar cyst     Past Surgical History:  Procedure Laterality Date  . CARDIAC CATHETERIZATION  02/25/2007  . COLONOSCOPY    . ESOPHAGOGASTRODUODENOSCOPY  10/1997 & 08/2005  . FOOT SURGERY  04/25/2007   right  . THYROIDECTOMY  11/2005  . VAGINAL HYSTERECTOMY     ovaries intact    Family History  Problem Relation Age of Onset  . Cancer Mother        colon  . Emphysema Father   . Heart disease Father   . Cancer Father        leukemia  . Cancer Brother        lung  . Cancer Maternal Aunt        melanoma  . Cancer Maternal Grandfather        stomach, colon  . Breast cancer Neg Hx   . Kidney cancer Neg Hx   . Kidney disease Neg Hx   . Prostate cancer Neg Hx     SOCIAL HX:  Married     Current Outpatient Medications:  .  ergocalciferol (VITAMIN D2) 50000 UNITS capsule, Take 50,000 Units by mouth every 30 (thirty) days., Disp: , Rfl:  .  ibuprofen (ADVIL,MOTRIN) 600 MG tablet, Take 1 tablet (600 mg total) by mouth every 8 (eight) hours as needed., Disp: 20 tablet, Rfl: 0 .  levothyroxine (SYNTHROID, LEVOTHROID) 88 MCG tablet, Take 1 tablet 6 days a week., Disp: , Rfl:  .  Multiple Vitamin (MULTIVITAMIN PO), Take 1 tablet by mouth daily.  , Disp: , Rfl:  .  nebivolol (BYSTOLIC) 10 MG tablet, Take 0.5 tablets (5 mg total) by mouth 2 (two) times daily., Disp: 90 tablet, Rfl: 3 .  simvastatin (ZOCOR) 20 MG tablet, TAKE 1 TABLET (20 MG) EVERY EVENING (IMPROVES CHOLESTEROL), Disp: 90 tablet, Rfl: 3  EXAM:  VITALS per patient if applicable:  GENERAL: alert, oriented, appears well and in no acute distress  PSYCH/NEURO: pleasant and cooperative, no obvious depression or anxiety, speech and thought processing grossly intact  ASSESSMENT AND PLAN:  Discussed the following assessment and plan:  Abnormal MRI, lumbar spine with likely lumbar radiculopathy Consider Dr. Lacinda Axon neurosurgery vs PM&R Dr. Sharlet Salina  Consider physical therapy Lidocaine pain patches  over the counter  Heat and back stretches As needed Tylenol 500 mg for pain no more than 3000 mg total in 1 day   Polyarthralgia - Plan: Comprehensive metabolic panel, CBC with Differential/Platelet, Antinuclear Antib (ANA), Rheumatoid Factor, CYCLIC CITRUL PEPTIDE ANTIBODY, IGG/IGA, C-reactive protein, Sedimentation rate  Sjogren's syndrome, with unspecified organ involvement ([place]) - Plan: Comprehensive metabolic panel, CBC with Differential/Platelet, Antinuclear Antib (ANA), Rheumatoid Factor, CYCLIC CITRUL PEPTIDE ANTIBODY, IGG/IGA, C-reactive protein, Sedimentation rate   HM Flu shot utd  prevnar utd  pna 23 due  shingrix prev disc'ed and rec  Tdap 09/03/11   mammo 10/06/18 neg  Last pap 09/08/12 neg no HPV  testing done ? If had another after this with Dr. Raphael Gibney? Will disc with pt s/p hysterectomy  Colonoscopy 04/12/14 tubular/hyperplastic polpys  Hep C neg 11/2001 and 06/27/15  DEXA  Repeat 11/18/17 h/o osteopenia  She follows with dermatology yearly   -we discussed possible serious and likely etiologies, options for evaluation and workup, limitations of telemedicine visit vs in person visit, treatment, treatment risks and precautions. Pt prefers to treat via telemedicine empirically rather then risking or undertaking an in person visit at this moment. Patient agrees to seek prompt in person care if worsening, new symptoms arise, or if is not improving with treatment.   I discussed the assessment and treatment plan with the patient. The patient was provided an opportunity to ask questions and all were answered. The patient agreed with the plan and demonstrated an understanding of the instructions.   The patient was advised to call back or seek an in-person evaluation if the symptoms worsen or if the condition fails to improve as anticipated.  Time spent 20 minutes  Delorise Jackson, MD

## 2019-08-13 ENCOUNTER — Telehealth: Payer: Self-pay | Admitting: Internal Medicine

## 2019-08-13 ENCOUNTER — Other Ambulatory Visit: Payer: Self-pay | Admitting: Internal Medicine

## 2019-08-13 ENCOUNTER — Encounter: Payer: Self-pay | Admitting: Internal Medicine

## 2019-08-13 DIAGNOSIS — R748 Abnormal levels of other serum enzymes: Secondary | ICD-10-CM

## 2019-08-13 DIAGNOSIS — R768 Other specified abnormal immunological findings in serum: Secondary | ICD-10-CM

## 2019-08-13 NOTE — Telephone Encounter (Signed)
Please add on GGT to [facility] labs   [place]

## 2019-08-14 LAB — COMPREHENSIVE METABOLIC PANEL
ALT: 20 IU/L (ref 0–32)
AST: 26 IU/L (ref 0–40)
Albumin/Globulin Ratio: 2.1 (ref 1.2–2.2)
Albumin: 4.2 g/dL (ref 3.8–4.8)
Alkaline Phosphatase: 119 IU/L — ABNORMAL HIGH (ref 39–117)
BUN/Creatinine Ratio: 11 — ABNORMAL LOW (ref 12–28)
BUN: 8 mg/dL (ref 8–27)
Bilirubin Total: 0.2 mg/dL (ref 0.0–1.2)
CO2: 25 mmol/L (ref 20–29)
Calcium: 9.1 mg/dL (ref 8.7–10.3)
Chloride: 103 mmol/L (ref 96–106)
Creatinine, Ser: 0.73 mg/dL (ref 0.57–1.00)
GFR calc Af Amer: 97 mL/min/{1.73_m2} (ref >59)
GFR calc non Af Amer: 84 mL/min/{1.73_m2} (ref >59)
Globulin, Total: 2 g/dL (ref 1.5–4.5)
Glucose: 87 mg/dL (ref 65–99)
Potassium: 3.6 mmol/L (ref 3.5–5.2)
Sodium: 140 mmol/L (ref 134–144)
Total Protein: 6.2 g/dL (ref 6.0–8.5)

## 2019-08-14 LAB — CBC WITH DIFFERENTIAL/PLATELET
Basophils Absolute: 0 10*3/uL (ref 0.0–0.2)
Basos: 0 %
EOS (ABSOLUTE): 0.1 10*3/uL (ref 0.0–0.4)
Eos: 2 %
Hematocrit: 39.1 % (ref 34.0–46.6)
Hemoglobin: 12.9 g/dL (ref 11.1–15.9)
Immature Grans (Abs): 0 10*3/uL (ref 0.0–0.1)
Immature Granulocytes: 0 %
Lymphocytes Absolute: 1.6 10*3/uL (ref 0.7–3.1)
Lymphs: 34 %
MCH: 32 pg (ref 26.6–33.0)
MCHC: 33 g/dL (ref 31.5–35.7)
MCV: 97 fL (ref 79–97)
Monocytes Absolute: 0.5 10*3/uL (ref 0.1–0.9)
Monocytes: 10 %
Neutrophils Absolute: 2.6 10*3/uL (ref 1.4–7.0)
Neutrophils: 54 %
Platelets: 174 10*3/uL (ref 150–450)
RBC: 4.03 x10E6/uL (ref 3.77–5.28)
RDW: 11.9 % (ref 11.7–15.4)
WBC: 4.8 10*3/uL (ref 3.4–10.8)

## 2019-08-14 LAB — ANA: Anti Nuclear Antibody (ANA): POSITIVE — AB

## 2019-08-14 LAB — SPECIMEN STATUS REPORT

## 2019-08-14 LAB — RHEUMATOID FACTOR: Rhuematoid fact SerPl-aCnc: <10 IU/mL (ref 0.0–13.9)

## 2019-08-14 LAB — C-REACTIVE PROTEIN: CRP: <1 mg/L (ref 0–10)

## 2019-08-14 LAB — CYCLIC CITRUL PEPTIDE ANTIBODY, IGG/IGA: Cyclic Citrullin Peptide Ab: 7 units (ref 0–19)

## 2019-08-14 LAB — SEDIMENTATION RATE: Sed Rate: 4 mm/hr (ref 0–40)

## 2019-08-14 LAB — TSH: TSH: 1.97 u[IU]/mL (ref 0.450–4.500)

## 2019-08-15 LAB — SPECIMEN STATUS REPORT

## 2019-08-15 LAB — GAMMA GT: GGT: 29 IU/L (ref 0–60)

## 2019-08-15 LAB — ANTINUCLEAR ANTIBODIES, IFA: ANA Titer 1: NEGATIVE

## 2019-08-17 ENCOUNTER — Telehealth: Payer: Self-pay | Admitting: *Deleted

## 2019-08-17 NOTE — Telephone Encounter (Signed)
Copied from [place] [id number]. Topic: General - Inquiry >> Aug 17, 2019  9:14 AM Richardo Priest, NT wrote: Reason for CRM: Pt called in and stated she would like to add a bone density scan as well to ultrasound. Please advise.

## 2019-08-18 NOTE — Telephone Encounter (Signed)
She had a bone density 11/18/17 osteopenia insurance will not pay for another at this time   Every 3-5 years some will pay for 2 years Inform pt   [place]

## 2019-08-19 NOTE — Telephone Encounter (Signed)
Patient has been informed.

## 2019-08-22 ENCOUNTER — Other Ambulatory Visit: Payer: Self-pay | Admitting: Cardiovascular Disease

## 2019-08-28 ENCOUNTER — Ambulatory Visit
Admission: RE | Admit: 2019-08-28 | Discharge: 2019-08-28 | Disposition: A | Discharge status: Home / Self Care | Payer: Medicare Other | Source: Ambulatory Visit | Attending: Internal Medicine | Admitting: Internal Medicine

## 2019-08-28 ENCOUNTER — Other Ambulatory Visit: Payer: Self-pay

## 2019-08-28 DIAGNOSIS — R748 Abnormal levels of other serum enzymes: Secondary | ICD-10-CM | POA: Insufficient documentation

## 2019-10-12 ENCOUNTER — Ambulatory Visit
Admission: RE | Admit: 2019-10-12 | Discharge: 2019-10-12 | Disposition: A | Discharge status: Home / Self Care | Payer: Medicare Other | Source: Ambulatory Visit | Attending: Family | Admitting: Family

## 2019-10-12 ENCOUNTER — Other Ambulatory Visit: Payer: Self-pay

## 2019-10-12 DIAGNOSIS — Z1231 Encounter for screening mammogram for malignant neoplasm of breast: Secondary | ICD-10-CM

## 2019-11-13 ENCOUNTER — Encounter: Payer: Self-pay | Admitting: Family

## 2019-11-13 ENCOUNTER — Ambulatory Visit (INDEPENDENT_AMBULATORY_CARE_PROVIDER_SITE_OTHER): Payer: Medicare Other | Admitting: Family

## 2019-11-13 VITALS — Ht 67.5 in | Wt 148.0 lb

## 2019-11-13 DIAGNOSIS — I471 Supraventricular tachycardia: Secondary | ICD-10-CM

## 2019-11-13 DIAGNOSIS — M3500 Sicca syndrome, unspecified: Secondary | ICD-10-CM

## 2019-11-13 DIAGNOSIS — R1031 Right lower quadrant pain: Secondary | ICD-10-CM | POA: Diagnosis not present

## 2019-11-13 DIAGNOSIS — Z13820 Encounter for screening for osteoporosis: Secondary | ICD-10-CM | POA: Diagnosis not present

## 2019-11-13 DIAGNOSIS — R1032 Left lower quadrant pain: Secondary | ICD-10-CM

## 2019-11-13 NOTE — Assessment & Plan Note (Signed)
Pending xrays. Offered mobic and she declines, she want to have imaging, rheumatology consult first. Referral placed.

## 2019-11-13 NOTE — Assessment & Plan Note (Signed)
Ordered dexa; patient will schedule

## 2019-11-13 NOTE — Assessment & Plan Note (Signed)
Controlled, rare palpitations; continue current regimen.

## 2019-11-13 NOTE — Progress Notes (Signed)
Virtual Visit via Video Note  I connected with@  on 11/13/19 at 11:00 AM EST by a video enabled telemedicine application and verified that I am speaking with the correct person using two identifiers.  Location patient: home Location provider:work  Persons participating in the virtual visit: patient, provider  I discussed the limitations of evaluation and management by telemedicine and the availability of in person appointments. The patient expressed understanding and agreed to proceed.   HPI: Follow up.  Continues to complain of right upper back pain, No pain with moving shoulder . Seen by Dr Aundra Dubin for bilateral hip and back pain. This conitnues. Describes BL groin pain. No pain of sides of hip.  Describes hip pain as worse when walking at first, however improves with walking in time. Pain will worsen after sitting for a long time, when she starts to stand. Describes as 'soreness' , resolved with advil. No numbness, weakness, trouble urinating, bowel movement.   Neck pain resolved with PT.   H/o sjogrens Hasnt seen rheumatologist in years.  Complains of dry mouth predominantly.  suprventricular tachycardia- every one in a while will have palpitations, not regulalry.  This week BP 112/65, HR 67.  Denies exertional chest pain or pressure, numbness or tingling radiating to left arm or jaw,  dizziness, frequent headaches, changes in vision, or shortness of breath.   Hypothyroidism- Follows with Dr Gabriel Carina  ROS: See pertinent positives and negatives per HPI.  Past Medical History:  Diagnosis Date  . Anemia   . Colonic polyp   . Constipation   . COPD (chronic obstructive pulmonary disease) ([place])   . Hyperlipidemia   . IBS (irritable bowel syndrome)   . Kidney cysts   . Lung nodule   . Meningioma ([place])   . Nausea   . Palpitations   . Tonsillar cyst     Past Surgical History:  Procedure Laterality Date  . CARDIAC CATHETERIZATION  02/25/2007  . COLONOSCOPY    .  ESOPHAGOGASTRODUODENOSCOPY  10/1997 & 08/2005  . FOOT SURGERY  04/25/2007   right  . THYROIDECTOMY  11/2005  . VAGINAL HYSTERECTOMY     ovaries intact    Family History  Problem Relation Age of Onset  . Cancer Mother        colon  . Emphysema Father   . Heart disease Father   . Cancer Father        leukemia  . Cancer Brother        lung  . Cancer Maternal Aunt        melanoma  . Cancer Maternal Grandfather        stomach, colon  . Breast cancer Neg Hx   . Kidney cancer Neg Hx   . Kidney disease Neg Hx   . Prostate cancer Neg Hx     SOCIAL HX: former smoker   Current Outpatient Medications:  .  ergocalciferol (VITAMIN D2) 50000 UNITS capsule, Take 50,000 Units by mouth every 30 (thirty) days., Disp: , Rfl:  .  ibuprofen (ADVIL,MOTRIN) 600 MG tablet, Take 1 tablet (600 mg total) by mouth every 8 (eight) hours as needed., Disp: 20 tablet, Rfl: 0 .  levothyroxine (SYNTHROID, LEVOTHROID) 88 MCG tablet, Take 1 tablet 6 days a week., Disp: , Rfl:  .  Multiple Vitamin (MULTIVITAMIN PO), Take 1 tablet by mouth daily.  , Disp: , Rfl:  .  nebivolol (BYSTOLIC) 10 MG tablet, Take 0.5 tablets (5 mg total) by mouth 2 (two) times daily., Disp: 90 tablet,  Rfl: 3 .  simvastatin (ZOCOR) 20 MG tablet, TAKE 1 TABLET (20 MG) EVERY EVENING (IMPROVES CHOLESTEROL), Disp: 90 tablet, Rfl: 1  EXAM:  VITALS per patient if applicable:  GENERAL: alert, oriented, appears well and in no acute distress  HEENT: atraumatic, conjunttiva clear, no obvious abnormalities on inspection of external nose and ears  NECK: normal movements of the head and neck  LUNGS: on inspection no signs of respiratory distress, breathing rate appears normal, no obvious gross SOB, gasping or wheezing  CV: no obvious cyanosis  MS: moves all visible extremities without noticeable abnormality  PSYCH/NEURO: pleasant and cooperative, no obvious depression or anxiety, speech and thought processing grossly intact  ASSESSMENT  AND PLAN:  Discussed the following assessment and plan:  Bilateral groin pain - Plan: Ambulatory referral to Rheumatology, DG Lumbar Spine Complete, DG HIPS BILAT WITH PELVIS 3-4 VIEWS, DG Bone Density, DG Thoracic Spine W/Swimmers  Paroxysmal supraventricular tachycardia (HCC)  SJOGREN'S SYNDROME  Screening for osteoporosis Problem List Items Addressed This Visit      Cardiovascular and Mediastinum   Paroxysmal supraventricular tachycardia (HCC)    Controlled, rare palpitations; continue current regimen.         Digestive   SJOGREN'S SYNDROME    H/o ; referral to rheumatology to re-establish care , particularly in setting of joint pain.         Other   Bilateral groin pain - Primary    Pending xrays. Offered mobic and she declines, she want to have imaging, rheumatology consult first. Referral placed.       Relevant Orders   Ambulatory referral to Rheumatology   DG Lumbar Spine Complete   DG HIPS BILAT WITH PELVIS 3-4 VIEWS   DG Bone Density   DG Thoracic Spine W/Swimmers   Screening for osteoporosis    Ordered dexa; patient will schedule         -we discussed possible serious and likely etiologies, options for evaluation and workup, limitations of telemedicine visit vs in person visit, treatment, treatment risks and precautions. Pt prefers to treat via telemedicine empirically rather then risking or undertaking an in person visit at this moment. Patient agrees to seek prompt in person care if worsening, new symptoms arise, or if is not improving with treatment.   I discussed the assessment and treatment plan with the patient. The patient was provided an opportunity to ask questions and all were answered. The patient agreed with the plan and demonstrated an understanding of the instructions.   The patient was advised to call back or seek an in-person evaluation if the symptoms worsen or if the condition fails to improve as anticipated.   Mable Paris, FNP

## 2019-11-13 NOTE — Assessment & Plan Note (Signed)
H/o ; referral to rheumatology to re-establish care , particularly in setting of joint pain.

## 2019-11-16 ENCOUNTER — Ambulatory Visit
Admission: RE | Admit: 2019-11-16 | Discharge: 2019-11-16 | Disposition: A | Discharge status: Home / Self Care | Payer: Medicare Other | Source: Ambulatory Visit | Attending: Family | Admitting: Family

## 2019-11-16 ENCOUNTER — Other Ambulatory Visit: Payer: Self-pay | Admitting: Family

## 2019-11-16 ENCOUNTER — Ambulatory Visit: Payer: Medicare Other | Attending: Internal Medicine

## 2019-11-16 DIAGNOSIS — R1030 Lower abdominal pain, unspecified: Secondary | ICD-10-CM | POA: Diagnosis not present

## 2019-11-16 DIAGNOSIS — R1032 Left lower quadrant pain: Secondary | ICD-10-CM

## 2019-11-16 DIAGNOSIS — R1031 Right lower quadrant pain: Secondary | ICD-10-CM | POA: Diagnosis not present

## 2019-11-16 DIAGNOSIS — E890 Postprocedural hypothyroidism: Secondary | ICD-10-CM | POA: Diagnosis not present

## 2019-11-16 DIAGNOSIS — E559 Vitamin D deficiency, unspecified: Secondary | ICD-10-CM | POA: Diagnosis not present

## 2019-11-16 DIAGNOSIS — Z20822 Contact with and (suspected) exposure to covid-19: Secondary | ICD-10-CM | POA: Diagnosis not present

## 2019-11-16 DIAGNOSIS — M546 Pain in thoracic spine: Secondary | ICD-10-CM | POA: Diagnosis not present

## 2019-11-16 DIAGNOSIS — M545 Low back pain: Secondary | ICD-10-CM | POA: Diagnosis not present

## 2019-11-17 LAB — NOVEL CORONAVIRUS, NAA: SARS-CoV-2, NAA: NOT DETECTED

## 2019-11-18 ENCOUNTER — Other Ambulatory Visit: Payer: Self-pay | Admitting: Family

## 2019-11-18 DIAGNOSIS — R1032 Left lower quadrant pain: Secondary | ICD-10-CM

## 2019-11-18 DIAGNOSIS — R1031 Right lower quadrant pain: Secondary | ICD-10-CM

## 2019-11-18 NOTE — Progress Notes (Signed)
Ref

## 2019-11-23 ENCOUNTER — Telehealth: Payer: Self-pay

## 2019-11-23 ENCOUNTER — Ambulatory Visit: Payer: Medicare Other | Attending: Internal Medicine

## 2019-11-23 DIAGNOSIS — Z23 Encounter for immunization: Secondary | ICD-10-CM | POA: Insufficient documentation

## 2019-11-23 DIAGNOSIS — E890 Postprocedural hypothyroidism: Secondary | ICD-10-CM | POA: Diagnosis not present

## 2019-11-23 NOTE — Telephone Encounter (Signed)
Contacted pt to schedule appt for COVID vaccine; pt offered and accepted appt at [facility] 11/23/19 at 1130; she verbalized understanding.

## 2019-11-23 NOTE — Progress Notes (Signed)
   Covid-19 Vaccination Clinic  Name:  Meghan Mack    MRN: [id number] DOB: 1950/10/27  11/23/2019  [name] was observed post Covid-19 immunization for 15 minutes without incidence. She was provided with Vaccine Information Sheet and instruction to access the V-Safe system.   [name] was instructed to call 911 with any severe reactions post vaccine: Marland Kitchen Difficulty breathing  . Swelling of your face and throat  . A fast heartbeat  . A bad rash all over your body  . Dizziness and weakness    Immunizations Administered    Name Date Dose VIS Date Route   Pfizer COVID-19 Vaccine 11/23/2019 11:20 AM 0.3 mL 09/25/2019 Intramuscular   Manufacturer: [place]   Lot: [id number]   [place]: [id number]

## 2019-11-23 NOTE — Telephone Encounter (Signed)
Pt returned call she received regarding her vaccine app. Will have nurse call her back.   [place]

## 2019-11-26 NOTE — Progress Notes (Signed)
Patient has completed xrays.

## 2019-12-07 DIAGNOSIS — R768 Other specified abnormal immunological findings in serum: Secondary | ICD-10-CM | POA: Diagnosis not present

## 2019-12-07 DIAGNOSIS — I7300 Raynaud's syndrome without gangrene: Secondary | ICD-10-CM | POA: Diagnosis not present

## 2019-12-07 DIAGNOSIS — M47816 Spondylosis without myelopathy or radiculopathy, lumbar region: Secondary | ICD-10-CM | POA: Diagnosis not present

## 2019-12-07 DIAGNOSIS — R682 Dry mouth, unspecified: Secondary | ICD-10-CM | POA: Diagnosis not present

## 2019-12-08 DIAGNOSIS — R682 Dry mouth, unspecified: Secondary | ICD-10-CM | POA: Diagnosis not present

## 2019-12-08 DIAGNOSIS — I7300 Raynaud's syndrome without gangrene: Secondary | ICD-10-CM | POA: Diagnosis not present

## 2019-12-08 DIAGNOSIS — R768 Other specified abnormal immunological findings in serum: Secondary | ICD-10-CM | POA: Diagnosis not present

## 2019-12-10 ENCOUNTER — Telehealth: Payer: Self-pay | Admitting: Family

## 2019-12-10 NOTE — Telephone Encounter (Signed)
error 

## 2019-12-11 ENCOUNTER — Telehealth: Payer: Self-pay | Admitting: Family

## 2019-12-11 ENCOUNTER — Ambulatory Visit: Payer: Medicare Other | Admitting: Internal Medicine

## 2019-12-16 DIAGNOSIS — I7300 Raynaud's syndrome without gangrene: Secondary | ICD-10-CM | POA: Diagnosis not present

## 2019-12-18 ENCOUNTER — Ambulatory Visit: Payer: Medicare Other

## 2019-12-18 ENCOUNTER — Ambulatory Visit: Payer: Medicare Other | Attending: Internal Medicine

## 2019-12-18 DIAGNOSIS — Z23 Encounter for immunization: Secondary | ICD-10-CM | POA: Insufficient documentation

## 2019-12-18 NOTE — Progress Notes (Signed)
   Covid-19 Vaccination Clinic  Name:  Greidis Mountford    MRN: [id number] DOB: [birth year]  12/18/2019  Ms. Kalinowski was observed post Covid-19 immunization for 15 minutes without incident. She was provided with Vaccine Information Sheet and instruction to access the V-Safe system.   Ms. Didonato was instructed to call 911 with any severe reactions post vaccine: Marland Kitchen Difficulty breathing  . Swelling of face and throat  . A fast heartbeat  . A bad rash all over body  . Dizziness and weakness   Immunizations Administered    Name Date Dose VIS Date Route   Pfizer COVID-19 Vaccine 12/18/2019  9:44 AM 0.3 mL 09/25/2019 Intramuscular   Manufacturer: [place]   Lot: [id number]   [place]: [id number]

## 2019-12-28 NOTE — Progress Notes (Signed)
NEUROLOGY FOLLOW UP OFFICE NOTE  Meghan Mack [id number]  HISTORY OF PRESENT ILLNESS: Meghan Mack is a 69 year old right-handed female with COPD, IBS, anxiety, Sjogren's, hypothyroidism and hyperlipidemia and meningioma who follows up primary stabbing headache.    UPDATE: Headaches occur but only once in awhile, lasting a few hours.  She is unable to take melatonin due to her thyroid condition.    Current NSAIDS:  ASA 81mg ; ibuprofen Current Antihypertensive medications:  Bystolic Current Vitamins/Herbal/Supplements:  D Other medications:  Simvastatin 20mg , Synthroid  HISTORY: She has had headaches off and on since around age 88.  It is a moderate to severe paroxysmal sharp pain, like a lightening bolt, brief and occurs in various areas over her head.  It can occur once or several times.  It came back in August.  They occurred off and on for 3 days in a row.  It seems to occur during rainy weather. Since then, she has had 1 or 2 spells also during rainy weather.  Separately, she has episodes of visual disturbance, in which she sees a circle surrounded by bright light in her right eye when she walks out into a bright environment (like walking outside in the sun.  It typically lasts a few seconds and occurs infrequently.  Also, if she looks at a checkered pattern, the lines start jumping.    No prior history of headaches.  MRI of brain with and without contrast on 06/05/19 showed stable 6 x 9 mm meningioma along the anterior tentorium.  MRA of head showed intracranial atheroclerosis with stenosis involving the right P2 segment and left ICA at skull base.  Carotid doppler from 06/24/19 showed no hemodynamically significant ICA stenosis and demonstrated antegrade flow in both vertebral arteries.   PAST MEDICAL HISTORY: Past Medical History:  Diagnosis Date  . Anemia   . Colonic polyp   . Constipation   . COPD (chronic obstructive pulmonary disease) ([place])   . Hyperlipidemia   .  IBS (irritable bowel syndrome)   . Kidney cysts   . Lung nodule   . Meningioma ([place])   . Nausea   . Palpitations   . Tonsillar cyst     MEDICATIONS: Current Outpatient Medications on File Prior to Visit  Medication Sig Dispense Refill  . ergocalciferol (VITAMIN D2) 50000 UNITS capsule Take 50,000 Units by mouth every 30 (thirty) days.    Marland Kitchen ibuprofen (ADVIL,MOTRIN) 600 MG tablet Take 1 tablet (600 mg total) by mouth every 8 (eight) hours as needed. 20 tablet 0  . levothyroxine (SYNTHROID, LEVOTHROID) 88 MCG tablet Take 1 tablet 6 days a week.    . Multiple Vitamin (MULTIVITAMIN PO) Take 1 tablet by mouth daily.      . nebivolol (BYSTOLIC) 10 MG tablet Take 0.5 tablets (5 mg total) by mouth 2 (two) times daily. 90 tablet 3  . simvastatin (ZOCOR) 20 MG tablet TAKE 1 TABLET (20 MG) EVERY EVENING (IMPROVES CHOLESTEROL) 90 tablet 1   No current facility-administered medications on file prior to visit.    ALLERGIES: Allergies  Allergen Reactions  . Tdap [Tetanus-Diphth-Acell Pertussis] Swelling and Rash  . Codeine   . Contrast Media [Iodinated Diagnostic Agents]     gabastat used 05/2019 for MRI/MRA made heart race.   . Diflucan [Fluconazole] Hives  . Esomeprazole Magnesium     REACTION: nausea  . Meperidine Hcl     REACTION: Causes heart to race  . Pneumovax 23 [Pneumococcal Vac Polyvalent]     Extensive  local reaction with arm swelling and induration  . Propantheline Bromide   . Propoxyphene N-Acetaminophen     REACTION: heart race  . Sulfa Antibiotics Other (See Comments)  . Sulfonamide Derivatives     FAMILY HISTORY: Family History  Problem Relation Age of Onset  . Cancer Mother        colon  . Emphysema Father   . Heart disease Father   . Cancer Father        leukemia  . Cancer Brother        lung  . Cancer Maternal Aunt        melanoma  . Cancer Maternal Grandfather        stomach, colon  . Breast cancer Neg Hx   . Kidney cancer Neg Hx   . Kidney disease Neg  Hx   . Prostate cancer Neg Hx     SOCIAL HISTORY: Social History   Socioeconomic History  . Marital status: Married    Spouse name: Not on file  . Number of children: 2  . Years of education: Not on file  . Highest education level: Not on file  Occupational History  . Occupation: Metallurgist: [employer]  Tobacco Use  . Smoking status: Former Smoker    Packs/day: 0.25    Years: 10.00    Pack years: 2.50    Types: Cigarettes    Quit date: 09/25/1997    Years since quitting: 22.2  . Smokeless tobacco: Never Used  Substance and Sexual Activity  . Alcohol use: No  . Drug use: No  . Sexual activity: Yes    Partners: Male    Birth control/protection: Surgical  Other Topics Concern  . Not on file  Social History Narrative   Lives with husband in [place], has 2 children and 2 step-children. No pets.      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       Diet - regular   Exercise - walks 5 days per week   1.5 years of college      Caffeine - 2-3 glasses iced tea day; coffee 1 cup 1/2 decaf and 1/2 reg   Right handed. One level home.    Social Determinants of Health   Financial Resource Strain: Low Risk   . Difficulty of Paying Living Expenses: Not hard at all  Food Insecurity:   . Worried About Charity fundraiser in the Last Year:   . Arboriculturist in the Last Year:   Transportation Needs:   . Film/video editor (Medical):   Marland Kitchen Lack of Transportation (Non-Medical):   Physical Activity:   . Days of Exercise per Week:   . Minutes of Exercise per Session:   Stress:   . Feeling of Stress :   Social Connections:   . Frequency of Communication with Friends and Family:   . Frequency of Social Gatherings with Friends and Family:   . Attends Religious Services:   . Active Member of Clubs or Organizations:   . Attends Archivist Meetings:   Marland Kitchen Marital Status:   Intimate Partner Violence:   . Fear of Current or Ex-Partner:    . Emotionally Abused:   Marland Kitchen Physically Abused:   . Sexually Abused:     REVIEW OF SYSTEMS: Constitutional: No fevers, chills, or sweats, no generalized fatigue, change in appetite Eyes: No visual changes, double vision, eye pain Ear, nose and throat:  No hearing loss, ear pain, nasal congestion, sore throat Cardiovascular: No chest pain, palpitations Respiratory:  No shortness of breath at rest or with exertion, wheezes GastrointestinaI: No nausea, vomiting, diarrhea, abdominal pain, fecal incontinence Genitourinary:  No dysuria, urinary retention or frequency Musculoskeletal:  No neck pain, back pain Integumentary: No rash, pruritus, skin lesions Neurological: as above Psychiatric: No depression, insomnia, anxiety Endocrine: No palpitations, fatigue, diaphoresis, mood swings, change in appetite, change in weight, increased thirst Hematologic/Lymphatic:  No purpura, petechiae. Allergic/Immunologic: no itchy/runny eyes, nasal congestion, recent allergic reactions, rashes  PHYSICAL EXAM: Blood pressure 134/80, pulse (!) 102, resp. rate 18, height 5\' 7"  (1.702 m), weight 151 lb (68.5 kg), SpO2 97 %. General: No acute distress.  Patient appears well-groomed.   Head:  Normocephalic/atraumatic Eyes:  Fundi examined but not visualized Neck: supple, no paraspinal tenderness, full range of motion Heart:  Regular rate and rhythm Lungs:  Clear to auscultation bilaterally Back: No paraspinal tenderness Neurological Exam: alert and oriented to person, place, and time. Attention span and concentration intact, recent and remote memory intact, fund of knowledge intact.  Speech fluent and not dysarthric, language intact.  CN II-XII intact. Bulk and tone normal, muscle strength 5/5 throughout.  Sensation to pinprick slightly reduced in toes and dorsum of left foot, decreased vibratory sensation in first toe of left foot; otherwise intact.  Deep tendon reflexes 2+ throughout, toes downgoing.  Finger to  nose and heel to shin testing intact.  Gait normal, Romberg negative.  IMPRESSION: 1.  Primary stabbing headache 2.  Ocular migraines 3.  Intracranial stenosis 4.  Cerebral meningioma  PLAN: 1. She will monitor headaches by keeping a diary.  If they appear to frequent, she will contact me and we can start gabapentin. 2.  Repeat MRA of head in 5 to 10 years. 3.  Follow up in 6 months.  Metta Clines, DO  CC: Mable Paris, FNP

## 2019-12-29 ENCOUNTER — Encounter: Payer: Self-pay | Admitting: Neurology

## 2019-12-29 ENCOUNTER — Ambulatory Visit: Payer: Medicare Other | Admitting: Neurology

## 2019-12-29 ENCOUNTER — Other Ambulatory Visit: Payer: Self-pay

## 2019-12-29 VITALS — BP 134/80 | HR 102 | Resp 18 | Ht 67.0 in | Wt 151.0 lb

## 2019-12-29 DIAGNOSIS — I679 Cerebrovascular disease, unspecified: Secondary | ICD-10-CM | POA: Diagnosis not present

## 2019-12-29 DIAGNOSIS — G43109 Migraine with aura, not intractable, without status migrainosus: Secondary | ICD-10-CM | POA: Diagnosis not present

## 2019-12-29 DIAGNOSIS — D329 Benign neoplasm of meninges, unspecified: Secondary | ICD-10-CM

## 2019-12-29 DIAGNOSIS — G4485 Primary stabbing headache: Secondary | ICD-10-CM

## 2019-12-29 NOTE — Patient Instructions (Signed)
Keep a headache diary to keep track of headache frequency.  If you feel that they are too frequent, contact me and we can start gabapentin.  Follow up in 6 months

## 2019-12-30 ENCOUNTER — Other Ambulatory Visit: Payer: Medicare Other

## 2020-01-07 ENCOUNTER — Ambulatory Visit
Admission: RE | Admit: 2020-01-07 | Discharge: 2020-01-07 | Disposition: A | Discharge status: Home / Self Care | Payer: Medicare Other | Source: Ambulatory Visit | Attending: Family | Admitting: Family

## 2020-01-07 DIAGNOSIS — R1032 Left lower quadrant pain: Secondary | ICD-10-CM | POA: Diagnosis not present

## 2020-01-07 DIAGNOSIS — M85851 Other specified disorders of bone density and structure, right thigh: Secondary | ICD-10-CM | POA: Insufficient documentation

## 2020-01-07 DIAGNOSIS — R1031 Right lower quadrant pain: Secondary | ICD-10-CM | POA: Diagnosis not present

## 2020-01-07 DIAGNOSIS — Z780 Asymptomatic menopausal state: Secondary | ICD-10-CM | POA: Diagnosis not present

## 2020-01-13 ENCOUNTER — Telehealth: Payer: Self-pay | Admitting: Family

## 2020-01-13 DIAGNOSIS — I7300 Raynaud's syndrome without gangrene: Secondary | ICD-10-CM | POA: Insufficient documentation

## 2020-01-13 DIAGNOSIS — R1031 Right lower quadrant pain: Secondary | ICD-10-CM

## 2020-01-13 NOTE — Telephone Encounter (Signed)
Patient would prefer referral to Dr. Sharlet Salina due to his specialty.

## 2020-01-13 NOTE — Telephone Encounter (Signed)
I called patient & she really was just wanting advice on where to go from here. She saw [facility] & the physician stated that her hips from xray looked fine. She asked if her pain could actually be stemming from her back & he told her that he did not specialize in the back, just hip. She was very discouraged by this & she has seen rheumatology as well. She thought that orthopedics would have also specialized in the back as well. I told her about Dr. Sharlet Salina, but did not know if that would be appropriate? She just said that her hip really bothers her feeling sore & stiff. Please advise?

## 2020-01-13 NOTE — Telephone Encounter (Signed)
Call pt  Im so sorry and know she is frustrated wih back and hip pain I cant actually see who she saw at [facility]; who did she see?  I can see Dr Francesca Oman note from 11/2019  I would advise either Dr Sharlet Salina at [facility] or Dr Tamala [facility] in [place].   Either one I have a lot of respect for and happy to place referral

## 2020-01-13 NOTE — Telephone Encounter (Signed)
Pt called wanting to be worked in for on-going hip pain and only wants to see Joycelyn Schmid

## 2020-01-18 NOTE — Telephone Encounter (Signed)
Patient called & notified that referral was placed. She will call if she does not hear in one week.

## 2020-01-18 NOTE — Addendum Note (Signed)
Addended by: Burnard Hawthorne on: 01/18/2020 11:11 AM   Modules accepted: Orders

## 2020-01-18 NOTE — Telephone Encounter (Signed)
Call patient I placed referral to Dr. Sharlet Salina as requested.  Please have her call the office in 1 week if she is not heard from [place] in regards this appointment.

## 2020-02-01 ENCOUNTER — Other Ambulatory Visit: Payer: Self-pay | Admitting: Cardiovascular Disease

## 2020-02-02 DIAGNOSIS — M25559 Pain in unspecified hip: Secondary | ICD-10-CM | POA: Diagnosis not present

## 2020-02-02 DIAGNOSIS — Z96643 Presence of artificial hip joint, bilateral: Secondary | ICD-10-CM | POA: Diagnosis not present

## 2020-02-02 DIAGNOSIS — G8928 Other chronic postprocedural pain: Secondary | ICD-10-CM | POA: Diagnosis not present

## 2020-02-02 DIAGNOSIS — M546 Pain in thoracic spine: Secondary | ICD-10-CM | POA: Diagnosis not present

## 2020-02-10 DIAGNOSIS — J301 Allergic rhinitis due to pollen: Secondary | ICD-10-CM | POA: Diagnosis not present

## 2020-02-10 DIAGNOSIS — K1123 Chronic sialoadenitis: Secondary | ICD-10-CM | POA: Diagnosis not present

## 2020-03-07 ENCOUNTER — Ambulatory Visit (INDEPENDENT_AMBULATORY_CARE_PROVIDER_SITE_OTHER): Payer: Medicare Other

## 2020-03-07 VITALS — Ht 67.0 in | Wt 150.0 lb

## 2020-03-07 DIAGNOSIS — Z0000 Encounter for general adult medical examination without abnormal findings: Secondary | ICD-10-CM | POA: Diagnosis not present

## 2020-03-07 NOTE — Progress Notes (Addendum)
Subjective:   Meghan Mack is a 69 y.o. female who presents for Medicare Annual (Subsequent) preventive examination.  Review of Systems:  No ROS.  Medicare Wellness Virtual Visit.  Visual/audio telehealth visit, UTA vital signs.  Wt/Ht provided.   See social history for additional risk factors.  Cardiac Risk Factors include: advanced age (>66men, >4 women)     Objective:     Vitals: Ht 5\' 7"  (1.702 m)   Wt 150 lb (68 kg)   BMI 23.49 kg/m   Body mass index is 23.49 kg/m.  Advanced Directives 03/07/2020 12/29/2019 07/01/2019 06/02/2019 03/05/2019 01/01/2018 11/27/2017  Does Patient Have a Medical Advance Directive? Yes Yes No Yes Yes Yes No  Type of Paramedic of [place];Living will - - Living will;Healthcare Power of [place];Living will [place];Living will -  Does patient want to make changes to medical advance directive? No - Patient declined - - No - Patient declined No - Patient declined No - Patient declined -  Copy of [place] in Chart? No - copy requested - - - No - copy requested No - copy requested -  Would patient like information on creating a medical advance directive? - - - - - - No - Patient declined    Tobacco Social History   Tobacco Use  Smoking Status Former Smoker  . Packs/day: 0.25  . Years: 10.00  . Pack years: 2.50  . Types: Cigarettes  . Quit date: 09/25/1997  . Years since quitting: 22.4  Smokeless Tobacco Never Used     Counseling given: Not Answered   Clinical Intake:  Pre-visit preparation completed: Yes        Diabetes: No  How often do you need to have someone help you when you read instructions, pamphlets, or other written materials from your doctor or pharmacy?: 1 - Never  Interpreter Needed?: No     Past Medical History:  Diagnosis Date  . Anemia   . Colonic polyp   . Constipation   . COPD (chronic obstructive pulmonary  disease) ([place])   . Hyperlipidemia   . IBS (irritable bowel syndrome)   . Kidney cysts   . Lung nodule   . Meningioma ([place])   . Nausea   . Palpitations   . Tonsillar cyst    Past Surgical History:  Procedure Laterality Date  . CARDIAC CATHETERIZATION  02/25/2007  . COLONOSCOPY    . ESOPHAGOGASTRODUODENOSCOPY  10/1997 & 08/2005  . FOOT SURGERY  04/25/2007   right  . THYROIDECTOMY  11/2005  . VAGINAL HYSTERECTOMY     ovaries intact   Family History  Problem Relation Age of Onset  . Cancer Mother        colon  . Emphysema Father   . Heart disease Father   . Cancer Father        leukemia  . Cancer Brother        lung  . Cancer Maternal Aunt        melanoma  . Cancer Maternal Grandfather        stomach, colon  . Breast cancer Neg Hx   . Kidney cancer Neg Hx   . Kidney disease Neg Hx   . Prostate cancer Neg Hx    Social History   Socioeconomic History  . Marital status: Married    Spouse name: Not on file  . Number of children: 2  . Years of education: Not on  file  . Highest education level: Not on file  Occupational History  . Occupation: Metallurgist: [employer]  Tobacco Use  . Smoking status: Former Smoker    Packs/day: 0.25    Years: 10.00    Pack years: 2.50    Types: Cigarettes    Quit date: 09/25/1997    Years since quitting: 22.4  . Smokeless tobacco: Never Used  Substance and Sexual Activity  . Alcohol use: No  . Drug use: No  . Sexual activity: Yes    Partners: Male    Birth control/protection: Surgical  Other Topics Concern  . Not on file  Social History Narrative   Lives with husband in [place], has 2 children and 2 step-children. No pets.      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       Diet - regular   Exercise - walks 5 days per week   1.5 years of college      Caffeine - 2-3 glasses iced tea day; coffee 1 cup 1/2 decaf and 1/2 reg   Right handed. One level home.    Social Determinants of Health    Financial Resource Strain:   . Difficulty of Paying Living Expenses:   Food Insecurity:   . Worried About Charity fundraiser in the Last Year:   . Arboriculturist in the Last Year:   Transportation Needs:   . Film/video editor (Medical):   Marland Kitchen Lack of Transportation (Non-Medical):   Physical Activity:   . Days of Exercise per Week:   . Minutes of Exercise per Session:   Stress:   . Feeling of Stress :   Social Connections: Unknown  . Frequency of Communication with Friends and Family: More than three times a week  . Frequency of Social Gatherings with Friends and Family: Not on file  . Attends Religious Services: Not on file  . Active Member of Clubs or Organizations: Not on file  . Attends Archivist Meetings: Not on file  . Marital Status: Married    Outpatient Encounter Medications as of 03/07/2020  Medication Sig  . azithromycin (ZITHROMAX) 250 MG tablet TAKE 2 TABLETS BY MOUTH TODAY, THEN TAKE 1 TABLET DAILY FOR 4 DAYS  . ergocalciferol (VITAMIN D2) 1.25 MG (50000 UT) capsule TAKE 1 CAPSULE (50,000 UNITS TOTAL) BY MOUTH MONTHLY  . ergocalciferol (VITAMIN D2) 50000 UNITS capsule Take 50,000 Units by mouth every 30 (thirty) days.  Marland Kitchen ibuprofen (ADVIL,MOTRIN) 600 MG tablet Take 1 tablet (600 mg total) by mouth every 8 (eight) hours as needed.  Marland Kitchen levothyroxine (SYNTHROID, LEVOTHROID) 88 MCG tablet Take 1 tablet 6 days a week.  . Multiple Vitamin (MULTIVITAMIN PO) Take 1 tablet by mouth daily.    . nebivolol (BYSTOLIC) 10 MG tablet Take 0.5 tablets (5 mg total) by mouth 2 (two) times daily.  . simvastatin (ZOCOR) 20 MG tablet TAKE 1 TABLET (20 MG) EVERY EVENING (IMPROVES CHOLESTEROL)   No facility-administered encounter medications on file as of 03/07/2020.    Activities of Daily Living In your present state of health, do you have any difficulty performing the following activities: 03/07/2020  Hearing? Y  Comment Hearing aid  Vision? N  Difficulty concentrating  or making decisions? N  Walking or climbing stairs? N  Dressing or bathing? N  Doing errands, shopping? N  Preparing Food and eating ? N  Using the Toilet? N  In the past  six months, have you accidently leaked urine? N  Do you have problems with loss of bowel control? N  Managing your Medications? N  Managing your Finances? N  Housekeeping or managing your Housekeeping? N  Some recent data might be hidden    Patient Care Team: Burnard Hawthorne, FNP as PCP - General (Family Medicine) Rockey Situ, Kathlene November, MD as Consulting Physician (Cardiology) Pieter Partridge, DO as Consulting Physician (Neurology)    Assessment:   This is a routine wellness examination for Meghan Mack.  Nurse connected with Velen Fraze  03/07/20 at  9:30 AM EDT by a telephone enabled telemedicine application, patient does not have access to video or has difficulty with video and verified that I am speaking with the correct person using two identifiers. Patient stated full name and DOB. Patient gave permission to continue with virtual visit. Patient's location was at home and Nurse's location was at [facility] office.   Patient is alert and oriented x3. Patient denies difficulty focusing or concentrating. Patient likes to play words with friends, complete jigsaw and crossword puzzles for brain health.   Health Maintenance Due: See completed HM at the end of note.   Eye: Visual acuity not assessed. Virtual visit. Followed by their ophthalmologist.  Dental: UTD  Hearing: Hearing aids- yes  Safety:  Patient feels safe at home- yes Patient does have smoke detectors at home- yes Patient does wear sunscreen or protective clothing when in direct sunlight - yes Patient does wear seat belt when in a moving vehicle - yes Patient drives- yes Adequate lighting in walkways free from debris- yes Grab bars and handrails used as appropriate- yes Ambulates with an assistive device- no Cell phone on person when ambulating outside  of the home- yes  Social: Alcohol intake - no     Smoking history- former   Smokers in home? none Illicit drug use? none  Medication: Taking as directed and without issues.  Self managed - yes   Covid-19: Precautions and sickness symptoms discussed. Wears mask, social distancing, hand hygiene as appropriate.   Activities of Daily Living Patient denies needing assistance with: household chores, feeding themselves, getting from bed to chair, getting to the toilet, bathing/showering, dressing, managing money, or preparing meals.   Discussed the importance of a healthy diet, water intake and the benefits of aerobic exercise.    Physical activity- Walks her dog 3 times weekly, 20-30 minutes.  Diet:  Low carb/low sodium  Water: fair intake Caffeine: 3 cups of tea  Other Providers Patient Care Team: Burnard Hawthorne, FNP as PCP - General (Family Medicine) Rockey Situ, Kathlene November, MD as Consulting Physician (Cardiology) Pieter Partridge, DO as Consulting Physician (Neurology)  Exercise Activities and Dietary recommendations Current Exercise Habits: Home exercise routine, Type of exercise: walking, Time (Minutes): 60, Frequency (Times/Week): 7, Weekly Exercise (Minutes/Week): 420, Intensity: Mild  Goals      Patient Stated   . Weight (lb) < 145 lb (65.8 kg) (pt-stated)     I want to lose about 5lb-10lb Monitor carb intake       Fall Risk Fall Risk  03/07/2020 12/29/2019 11/13/2019 08/12/2019 07/01/2019  Falls in the past year? 0 1 0 0 0  Number falls in past yr: - 1 0 - -  Injury with Fall? - 1 - - -  Risk Factor Category  - - - - -  Risk for fall due to : - - - - -  Risk for fall due to: Comment - - - - -  Follow up Falls evaluation completed - Falls evaluation completed - -   Timed Get Up and Go performed: no, virtual visit  Depression Screen PHQ 2/9 Scores 03/07/2020 11/13/2019 08/12/2019 06/12/2019  PHQ - 2 Score 0 0 0 0  PHQ- 9 Score - - - -     Cognitive Function MMSE -  Mini Mental State Exam 01/01/2018 08/03/2015  Not completed: Refused -  Orientation to time - 5  Orientation to Place - 5  Registration - 3  Attention/ Calculation - 4  Recall - 2  Language- name 2 objects - 2  Language- repeat - 1  Language- follow 3 step command - 3  Language- read & follow direction - 1  Write a sentence - 1  Copy design - 1  Total score - 28   Montreal Cognitive Assessment  01/19/2016  Visuospatial/ Executive (0/5) 3  Naming (0/3) 3  Attention: Read list of digits (0/2) 2  Attention: Read list of letters (0/1) 1  Attention: Serial 7 subtraction starting at 100 (0/3) 0  Language: Repeat phrase (0/2) 2  Language : Fluency (0/1) 1  Abstraction (0/2) 2  Delayed Recall (0/5) 3  Orientation (0/6) 6  Total 23  Adjusted Score (based on education) 23   6CIT Screen 03/07/2020 03/05/2019  What Year? 0 points 0 points  What month? 0 points 0 points  What time? 0 points 0 points  Count back from 20 0 points 0 points  Months in reverse 0 points 0 points  Repeat phrase 2 points 0 points  Total Score 2 0    Immunization History  Administered Date(s) Administered  . Influenza Split 07/15/2011, 06/26/2014  . Influenza Whole 06/22/2009, 06/13/2010, 07/05/2012, 07/06/2018  . Influenza, High Dose Seasonal PF 07/06/2018, 07/16/2019  . Influenza,inj,quad, With Preservative 07/16/2019  . Influenza-Unspecified 06/15/2013, 06/19/2015, 06/29/2016, 07/14/2017, 07/16/2019  . PFIZER SARS-COV-2 Vaccination 11/23/2019, 12/18/2019  . Pneumococcal Conjugate-13 06/27/2015  . Pneumococcal Polysaccharide-23 08/11/2007  . Td 07/17/2001  . Tdap 09/03/2011  . Zoster 06/13/2010   Screening Tests Health Maintenance  Topic Date Due  . INFLUENZA VACCINE  05/15/2020  . TETANUS/TDAP  09/02/2021  . MAMMOGRAM  10/11/2021  . COLONOSCOPY  07/20/2024  . DEXA SCAN  Completed  . COVID-19 Vaccine  Completed  . Hepatitis C Screening  Completed  . PNA vac Low Risk Adult  Discontinued        Plan:   Keep all routine maintenance appointments.   Follow up 03/16/20 11:30  Medicare Attestation I have personally reviewed: The patient's medical and social history Their use of alcohol, tobacco or illicit drugs Their current medications and supplements The patient's functional ability including ADLs,fall risks, home safety risks, cognitive, and hearing and visual impairment Diet and physical activities Evidence for depression   I have reviewed and discussed with patient certain preventive protocols, quality metrics, and best practice recommendations.     Varney Biles, LPN  [id number]   Agree with plan. Mable Paris, NP

## 2020-03-07 NOTE — Patient Instructions (Addendum)
  Ms. Brist , Thank you for taking time to come for your Medicare Wellness Visit. I appreciate your ongoing commitment to your health goals. Please review the following plan we discussed and let me know if I can assist you in the future.   These are the goals we discussed: Goals      Patient Stated   . Weight (lb) < 145 lb (65.8 kg) (pt-stated)     I want to lose about 5lb-10lb Monitor carb intake       This is a list of the screening recommended for you and due dates:  Health Maintenance  Topic Date Due  . Flu Shot  05/15/2020  . Tetanus Vaccine  09/02/2021  . Mammogram  10/11/2021  . Colon Cancer Screening  07/20/2024  . DEXA scan (bone density measurement)  Completed  . COVID-19 Vaccine  Completed  .  Hepatitis C: One time screening is recommended by Center for Disease Control  (CDC) for  adults born from [birth year] through 1966.   Completed  . Pneumonia vaccines  Discontinued

## 2020-03-16 ENCOUNTER — Other Ambulatory Visit: Payer: Self-pay

## 2020-03-16 ENCOUNTER — Ambulatory Visit (INDEPENDENT_AMBULATORY_CARE_PROVIDER_SITE_OTHER): Payer: Medicare Other | Admitting: Family

## 2020-03-16 ENCOUNTER — Encounter: Payer: Self-pay | Admitting: Family

## 2020-03-16 ENCOUNTER — Telehealth: Payer: Self-pay | Admitting: Cardiovascular Disease

## 2020-03-16 DIAGNOSIS — R1032 Left lower quadrant pain: Secondary | ICD-10-CM | POA: Diagnosis not present

## 2020-03-16 DIAGNOSIS — I700 Atherosclerosis of aorta: Secondary | ICD-10-CM | POA: Diagnosis not present

## 2020-03-16 DIAGNOSIS — R1031 Right lower quadrant pain: Secondary | ICD-10-CM | POA: Diagnosis not present

## 2020-03-16 NOTE — Telephone Encounter (Signed)
To Dr. Gollan to review.  

## 2020-03-16 NOTE — Telephone Encounter (Signed)
Patient calling to schedule yearly appt in September. After discussing with her PCP, patient would like to do a Cardiac calcium score test before her appointment. Patient would also like to do her blood work for the visit ahead of the visit.  Please advise patient on her choices

## 2020-03-16 NOTE — Assessment & Plan Note (Signed)
Compliant with statin, will continue 

## 2020-03-16 NOTE — Assessment & Plan Note (Signed)
Left lateral hip pain persists. She has seen rheumatology, physical medicine, and orthopedics. I have advised that PT is reasonable to see if improvement. She didn't receive a call from Dr Alba Destine office in regards to this and I have provided her with number to do so today.

## 2020-03-16 NOTE — Patient Instructions (Addendum)
As discussed please call Dr Alba Destine in regards to physical therapy- [phone].  Let me know how you are doing!

## 2020-03-16 NOTE — Progress Notes (Signed)
Subjective:    Patient ID: Meghan Mack, female    DOB: [birth year], 69 y.o.   MRN: [id number]  CC: Meghan Mack is a 69 y.o. female who presents today for follow up.   HPI: Continues to have lateral left hip pain, for 8 months, unchanged.  No pain with sitting. No low back pain. Pain and stiffness with steps or lateral motion.   Complains of intermittent left toes. No tingling or numbness in buttocks or thigh. Advil prn as been helpful.   Physical medicine [facility] Dr Meghan Mack- 02/02/20 for chronic bilateral hip pain. Plan to start with PT.  Dr Meghan Mack 11/17/19- Sjogrens, raynauds, and lumbar facet arthopathy  Has also seen orthopedic with [facility].   HLD-compliant with zocor      HISTORY:  Past Medical History:  Diagnosis Date  . Anemia   . Colonic polyp   . Constipation   . COPD (chronic obstructive pulmonary disease) ([place])   . Hyperlipidemia   . IBS (irritable bowel syndrome)   . Kidney cysts   . Lung nodule   . Meningioma ([place])   . Nausea   . Palpitations   . Tonsillar cyst    Past Surgical History:  Procedure Laterality Date  . CARDIAC CATHETERIZATION  02/25/2007  . COLONOSCOPY    . ESOPHAGOGASTRODUODENOSCOPY  10/1997 & 08/2005  . FOOT SURGERY  04/25/2007   right  . THYROIDECTOMY  11/2005  . VAGINAL HYSTERECTOMY     ovaries intact   Family History  Problem Relation Age of Onset  . Cancer Mother        colon  . Emphysema Father   . Heart disease Father   . Cancer Father        leukemia  . Cancer Brother        lung  . Cancer Maternal Aunt        melanoma  . Cancer Maternal Grandfather        stomach, colon  . Breast cancer Neg Hx   . Kidney cancer Neg Hx   . Kidney disease Neg Hx   . Prostate cancer Neg Hx     Allergies: Tdap [tetanus-diphth-acell pertussis], Codeine, Contrast media [iodinated diagnostic agents], Diflucan [fluconazole], Esomeprazole magnesium, Meperidine hcl, Pneumovax 23 [pneumococcal vac polyvalent], Propantheline bromide,  Propoxyphene n-acetaminophen, Sulfa antibiotics, and Sulfonamide derivatives Current Outpatient Medications on File Prior to Visit  Medication Sig Dispense Refill  . ergocalciferol (VITAMIN D2) 50000 UNITS capsule Take 50,000 Units by mouth every 30 (thirty) days.    Marland Kitchen ibuprofen (ADVIL,MOTRIN) 600 MG tablet Take 1 tablet (600 mg total) by mouth every 8 (eight) hours as needed. 20 tablet 0  . levothyroxine (SYNTHROID, LEVOTHROID) 88 MCG tablet Take 1 tablet 6 days a week.    . Multiple Vitamin (MULTIVITAMIN PO) Take 1 tablet by mouth daily.      . nebivolol (BYSTOLIC) 10 MG tablet Take 0.5 tablets (5 mg total) by mouth 2 (two) times daily. 90 tablet 3  . simvastatin (ZOCOR) 20 MG tablet TAKE 1 TABLET (20 MG) EVERY EVENING (IMPROVES CHOLESTEROL) 90 tablet 1   No current facility-administered medications on file prior to visit.    Social History   Tobacco Use  . Smoking status: Former Smoker    Packs/day: 0.25    Years: 10.00    Pack years: 2.50    Types: Cigarettes    Quit date: 09/25/1997    Years since quitting: 22.4  . Smokeless tobacco: Never Used  Substance Use Topics  .  Alcohol use: No  . Drug use: No    Review of Systems  Constitutional: Negative for chills and fever.  Respiratory: Negative for cough and shortness of breath.   Cardiovascular: Negative for chest pain, palpitations and leg swelling.  Gastrointestinal: Negative for nausea and vomiting.  Musculoskeletal: Positive for arthralgias. Negative for back pain.      Objective:    BP 122/80   Pulse 87   Temp (!) 97 F (36.1 C) (Temporal)   Ht 5' 7.5" (1.715 m)   Wt 152 lb 3.2 oz (69 kg)   SpO2 97%   BMI 23.49 kg/m  BP Readings from Last 3 Encounters:  03/16/20 122/80  12/29/19 134/80  06/23/19 124/78   Wt Readings from Last 3 Encounters:  03/16/20 152 lb 3.2 oz (69 kg)  03/07/20 150 lb (68 kg)  12/29/19 151 lb (68.5 kg)    Physical Exam Vitals reviewed.  Constitutional:      Appearance: She is  well-developed.  Eyes:     Conjunctiva/sclera: Conjunctivae normal.  Cardiovascular:     Rate and Rhythm: Normal rate and regular rhythm.     Pulses: Normal pulses.     Heart sounds: Normal heart sounds.  Pulmonary:     Effort: Pulmonary effort is normal.     Breath sounds: Normal breath sounds. No wheezing, rhonchi or rales.  Skin:    General: Skin is warm and dry.  Neurological:     Mental Status: She is alert.  Psychiatric:        Speech: Speech normal.        Behavior: Behavior normal.        Thought Content: Thought content normal.        Assessment & Plan:   Problem List Items Addressed This Visit      Cardiovascular and Mediastinum   Aortic atherosclerosis ([place])    Compliant with statin, will continue.         Other   Bilateral groin pain    Left lateral hip pain persists. She has seen rheumatology, physical medicine, and orthopedics. I have advised that PT is reasonable to see if improvement. She didn't receive a call from Dr Meghan Mack office in regards to this and I have provided her with number to do so today.          I have discontinued Meghan Mack. Meghan Mack's azithromycin. I am also having her maintain her Multiple Vitamin (MULTIVITAMIN PO), levothyroxine, ergocalciferol, ibuprofen, nebivolol, and simvastatin.   No orders of the defined types were placed in this encounter.   Return precautions given.   Risks, benefits, and alternatives of the medications and treatment plan prescribed today were discussed, and patient expressed understanding.   Education regarding symptom management and diagnosis given to patient on AVS.  Continue to follow with Burnard Hawthorne, FNP for routine health maintenance.   Meghan Mack and I agreed with plan.   Mable Paris, FNP

## 2020-03-17 NOTE — Telephone Encounter (Signed)
Patient calling to check on status.

## 2020-03-17 NOTE — Telephone Encounter (Signed)
We already have chest ct x2 from 2019 Repeat not needed We can pull up images and share on her next visit

## 2020-03-22 DIAGNOSIS — H40003 Preglaucoma, unspecified, bilateral: Secondary | ICD-10-CM | POA: Diagnosis not present

## 2020-03-28 DIAGNOSIS — H903 Sensorineural hearing loss, bilateral: Secondary | ICD-10-CM | POA: Diagnosis not present

## 2020-03-28 DIAGNOSIS — H8111 Benign paroxysmal vertigo, right ear: Secondary | ICD-10-CM | POA: Diagnosis not present

## 2020-03-28 DIAGNOSIS — H6121 Impacted cerumen, right ear: Secondary | ICD-10-CM | POA: Diagnosis not present

## 2020-03-28 DIAGNOSIS — H8110 Benign paroxysmal vertigo, unspecified ear: Secondary | ICD-10-CM | POA: Diagnosis not present

## 2020-04-04 DIAGNOSIS — H8111 Benign paroxysmal vertigo, right ear: Secondary | ICD-10-CM | POA: Diagnosis not present

## 2020-04-07 DIAGNOSIS — M25552 Pain in left hip: Secondary | ICD-10-CM | POA: Diagnosis not present

## 2020-04-07 DIAGNOSIS — G8929 Other chronic pain: Secondary | ICD-10-CM | POA: Diagnosis not present

## 2020-04-07 DIAGNOSIS — M25551 Pain in right hip: Secondary | ICD-10-CM | POA: Diagnosis not present

## 2020-04-11 DIAGNOSIS — H8111 Benign paroxysmal vertigo, right ear: Secondary | ICD-10-CM | POA: Diagnosis not present

## 2020-04-19 DIAGNOSIS — H8111 Benign paroxysmal vertigo, right ear: Secondary | ICD-10-CM | POA: Diagnosis not present

## 2020-04-21 DIAGNOSIS — G8929 Other chronic pain: Secondary | ICD-10-CM | POA: Diagnosis not present

## 2020-04-21 DIAGNOSIS — L570 Actinic keratosis: Secondary | ICD-10-CM | POA: Diagnosis not present

## 2020-04-21 DIAGNOSIS — L821 Other seborrheic keratosis: Secondary | ICD-10-CM | POA: Diagnosis not present

## 2020-04-21 DIAGNOSIS — Z872 Personal history of diseases of the skin and subcutaneous tissue: Secondary | ICD-10-CM | POA: Diagnosis not present

## 2020-04-21 DIAGNOSIS — Z86018 Personal history of other benign neoplasm: Secondary | ICD-10-CM | POA: Diagnosis not present

## 2020-04-21 DIAGNOSIS — M25551 Pain in right hip: Secondary | ICD-10-CM | POA: Diagnosis not present

## 2020-04-21 DIAGNOSIS — D1801 Hemangioma of skin and subcutaneous tissue: Secondary | ICD-10-CM | POA: Diagnosis not present

## 2020-04-21 DIAGNOSIS — M25552 Pain in left hip: Secondary | ICD-10-CM | POA: Diagnosis not present

## 2020-04-28 DIAGNOSIS — M25551 Pain in right hip: Secondary | ICD-10-CM | POA: Diagnosis not present

## 2020-04-28 DIAGNOSIS — M25552 Pain in left hip: Secondary | ICD-10-CM | POA: Diagnosis not present

## 2020-04-28 DIAGNOSIS — G8929 Other chronic pain: Secondary | ICD-10-CM | POA: Diagnosis not present

## 2020-05-05 DIAGNOSIS — M25551 Pain in right hip: Secondary | ICD-10-CM | POA: Diagnosis not present

## 2020-05-05 DIAGNOSIS — M25552 Pain in left hip: Secondary | ICD-10-CM | POA: Diagnosis not present

## 2020-05-05 DIAGNOSIS — G8929 Other chronic pain: Secondary | ICD-10-CM | POA: Diagnosis not present

## 2020-05-16 DIAGNOSIS — E890 Postprocedural hypothyroidism: Secondary | ICD-10-CM | POA: Diagnosis not present

## 2020-05-16 DIAGNOSIS — E559 Vitamin D deficiency, unspecified: Secondary | ICD-10-CM | POA: Diagnosis not present

## 2020-05-18 ENCOUNTER — Other Ambulatory Visit: Payer: Self-pay | Admitting: Sports Medicine

## 2020-05-18 DIAGNOSIS — M76892 Other specified enthesopathies of left lower limb, excluding foot: Secondary | ICD-10-CM

## 2020-05-18 DIAGNOSIS — M25551 Pain in right hip: Secondary | ICD-10-CM | POA: Diagnosis not present

## 2020-05-18 DIAGNOSIS — M7062 Trochanteric bursitis, left hip: Secondary | ICD-10-CM | POA: Diagnosis not present

## 2020-05-18 DIAGNOSIS — M25552 Pain in left hip: Secondary | ICD-10-CM | POA: Diagnosis not present

## 2020-05-18 DIAGNOSIS — M76891 Other specified enthesopathies of right lower limb, excluding foot: Secondary | ICD-10-CM

## 2020-05-23 DIAGNOSIS — E890 Postprocedural hypothyroidism: Secondary | ICD-10-CM | POA: Diagnosis not present

## 2020-06-04 ENCOUNTER — Other Ambulatory Visit: Payer: Self-pay

## 2020-06-04 ENCOUNTER — Ambulatory Visit
Admission: RE | Admit: 2020-06-04 | Discharge: 2020-06-04 | Disposition: A | Discharge status: Home / Self Care | Payer: Medicare Other | Source: Ambulatory Visit | Attending: Sports Medicine | Admitting: Sports Medicine

## 2020-06-04 DIAGNOSIS — M76891 Other specified enthesopathies of right lower limb, excluding foot: Secondary | ICD-10-CM | POA: Insufficient documentation

## 2020-06-04 DIAGNOSIS — M76892 Other specified enthesopathies of left lower limb, excluding foot: Secondary | ICD-10-CM | POA: Diagnosis not present

## 2020-06-04 DIAGNOSIS — M25552 Pain in left hip: Secondary | ICD-10-CM | POA: Insufficient documentation

## 2020-06-04 DIAGNOSIS — M7062 Trochanteric bursitis, left hip: Secondary | ICD-10-CM | POA: Insufficient documentation

## 2020-06-14 DIAGNOSIS — M25552 Pain in left hip: Secondary | ICD-10-CM | POA: Diagnosis not present

## 2020-06-16 ENCOUNTER — Other Ambulatory Visit: Payer: Self-pay

## 2020-06-16 ENCOUNTER — Ambulatory Visit: Payer: Medicare Other | Admitting: Dermatology

## 2020-06-16 DIAGNOSIS — L719 Rosacea, unspecified: Secondary | ICD-10-CM | POA: Diagnosis not present

## 2020-06-16 DIAGNOSIS — L720 Epidermal cyst: Secondary | ICD-10-CM | POA: Diagnosis not present

## 2020-06-16 NOTE — Progress Notes (Signed)
   Follow-Up Visit   Subjective  Meghan Mack is a 69 y.o. female who presents for the following: Skin Problem (Check a red spot on my R check, comes and go, treating with TMC cream).  The following portions of the chart were reviewed this encounter and updated as appropriate:  Tobacco  Allergies  Meds  Problems  Med Hx  Surg Hx  Fam Hx     Review of Systems:  No other skin or systemic complaints except as noted in HPI or Assessment and Plan.  Objective  Well appearing patient in no apparent distress; mood and affect are within normal limits.  A focused examination was performed including face. Relevant physical exam findings are noted in the Assessment and Plan.  Objective  Head - Anterior (Face): Mild Erythema at cheeks   Images      Objective  R cheek: Smooth white papule(s).    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  Observation.  Call clinic for new or changing moles.  Recommend daily use of broad spectrum spf 30+ sunscreen to sun-exposed areas.  Observe  No treatment needed   Return if symptoms worsen or fail to improve.  IMarye Round, CMA, am acting as scribe for Sarina Ser, MD .  Documentation: I have reviewed the above documentation for accuracy and completeness, and I agree with the above.  Sarina Ser, MD

## 2020-06-16 NOTE — Patient Instructions (Addendum)
Gentle Skin Care Guide  1. Bathe no more than once a day.  2. Avoid bathing in hot water  3. Use a mild soap like Dove, Vanicream, Cetaphil, CeraVe. Can use Lever 2000 or Cetaphil antibacterial soap  4. Use soap only where you need it. On most days, use it under your arms, between your legs, and on your feet. Let the water rinse other areas unless visibly dirty.  5. When you get out of the bath/shower, use a towel to gently blot your skin dry, don't rub it.  6. While your skin is still a little damp, apply a moisturizing cream such as Vanicream, CeraVe, Cetaphil, Eucerin, Sarna lotion or plain Vaseline Jelly. For hands apply Neutrogena [place] Hand Cream or Excipial Hand Cream.  7. Reapply moisturizer any time you start to itch or feel dry.  8. Sometimes using free and clear laundry detergents can be helpful. Fabric softener sheets should be avoided. Downy Free & Gentle liquid, or any liquid fabric softener that is free of dyes and perfumes, it acceptable to use  9. If your doctor has given you prescription creams you may apply moisturizers over them    Erythematotelangiectatic Rosacea

## 2020-06-17 ENCOUNTER — Encounter: Payer: Self-pay | Admitting: Family

## 2020-06-17 ENCOUNTER — Telehealth: Payer: Self-pay | Admitting: Family

## 2020-06-17 ENCOUNTER — Ambulatory Visit (INDEPENDENT_AMBULATORY_CARE_PROVIDER_SITE_OTHER): Payer: Medicare Other

## 2020-06-17 ENCOUNTER — Ambulatory Visit (INDEPENDENT_AMBULATORY_CARE_PROVIDER_SITE_OTHER): Payer: Medicare Other | Admitting: Family

## 2020-06-17 VITALS — BP 118/70 | HR 87 | Temp 97.7°F | Ht 67.5 in | Wt 152.6 lb

## 2020-06-17 DIAGNOSIS — M7062 Trochanteric bursitis, left hip: Secondary | ICD-10-CM

## 2020-06-17 DIAGNOSIS — Z87891 Personal history of nicotine dependence: Secondary | ICD-10-CM | POA: Insufficient documentation

## 2020-06-17 DIAGNOSIS — M25471 Effusion, right ankle: Secondary | ICD-10-CM

## 2020-06-17 DIAGNOSIS — R911 Solitary pulmonary nodule: Secondary | ICD-10-CM

## 2020-06-17 DIAGNOSIS — M25571 Pain in right ankle and joints of right foot: Secondary | ICD-10-CM | POA: Diagnosis not present

## 2020-06-17 NOTE — Assessment & Plan Note (Signed)
Improving. Following with orthopedics, will follow

## 2020-06-17 NOTE — Progress Notes (Signed)
Subjective:    Patient ID: Meghan Mack, female    DOB: 07-Jun-1951, 69 y.o.   MRN: [id number]  CC: Meghan Mack is a 69 y.o. female who presents today for follow up.   HPI:  Right lateral 'knot' couple of weeks ago, unchanged. Feels 'puffy' and notes that will enlarge as day goes on.  Improves with elevation and compression stockings.  No injury, fall.  No pain or numbness in right ankle. No pain when walking. No calf swelling, SOB.   Left lateral thigh pain has improved.  Changed exercises and taking advil daily.  Restarting PT as ordered by Dr Candelaria Stagers. Will likely have steriod injection.   Former smoker  H/o lung nodule seen CT chest 2019, dr simonds        06/14/20  Dr Candelaria Stagers- left trochanteric bursitis; referred to PT  HISTORY:  Past Medical History:  Diagnosis Date  . Anemia   . Colonic polyp   . Constipation   . COPD (chronic obstructive pulmonary disease) ([place])   . Hyperlipidemia   . IBS (irritable bowel syndrome)   . Kidney cysts   . Lung nodule   . Meningioma ([place])   . Nausea   . Palpitations   . Tonsillar cyst    Past Surgical History:  Procedure Laterality Date  . CARDIAC CATHETERIZATION  02/25/2007  . COLONOSCOPY    . ESOPHAGOGASTRODUODENOSCOPY  10/1997 & 08/2005  . FOOT SURGERY  04/25/2007   right  . THYROIDECTOMY  11/2005  . VAGINAL HYSTERECTOMY     ovaries intact   Family History  Problem Relation Age of Onset  . Cancer Mother        colon  . Emphysema Father   . Heart disease Father   . Cancer Father        leukemia  . Cancer Brother        lung  . Cancer Maternal Aunt        melanoma  . Cancer Maternal Grandfather        stomach, colon  . Breast cancer Neg Hx   . Kidney cancer Neg Hx   . Kidney disease Neg Hx   . Prostate cancer Neg Hx     Allergies: Tdap [tetanus-diphth-acell pertussis], Codeine, Contrast media [iodinated diagnostic agents], Diflucan [fluconazole], Esomeprazole magnesium, Meperidine hcl,  Pneumovax 23 [pneumococcal vac polyvalent], Propantheline bromide, Propoxyphene n-acetaminophen, Sulfa antibiotics, and Sulfonamide derivatives Current Outpatient Medications on File Prior to Visit  Medication Sig Dispense Refill  . ergocalciferol (VITAMIN D2) 50000 UNITS capsule Take 50,000 Units by mouth every 30 (thirty) days.    Marland Kitchen ibuprofen (ADVIL,MOTRIN) 600 MG tablet Take 1 tablet (600 mg total) by mouth every 8 (eight) hours as needed. 20 tablet 0  . levothyroxine (SYNTHROID, LEVOTHROID) 88 MCG tablet Take 1 tablet 6 days a week.    . Multiple Vitamin (MULTIVITAMIN PO) Take 1 tablet by mouth daily.      . nebivolol (BYSTOLIC) 10 MG tablet Take 0.5 tablets (5 mg total) by mouth 2 (two) times daily. 90 tablet 3  . simvastatin (ZOCOR) 20 MG tablet TAKE 1 TABLET (20 MG) EVERY EVENING (IMPROVES CHOLESTEROL) 90 tablet 1   No current facility-administered medications on file prior to visit.    Social History   Tobacco Use  . Smoking status: Former Smoker    Packs/day: 0.25    Years: 10.00    Pack years: 2.50    Types: Cigarettes    Quit date: 09/25/1997  Years since quitting: 22.7  . Smokeless tobacco: Never Used  Substance Use Topics  . Alcohol use: No  . Drug use: No    Review of Systems  Constitutional: Negative for chills and fever.  Respiratory: Negative for cough and shortness of breath.   Cardiovascular: Positive for leg swelling. Negative for chest pain and palpitations.  Gastrointestinal: Negative for nausea and vomiting.  Musculoskeletal: Positive for arthralgias (left hip).  Neurological: Negative for numbness.      Objective:    BP 118/70   Pulse 87   Temp 97.7 F (36.5 C)   Ht 5' 7.5" (1.715 m)   Wt 152 lb 9.6 oz (69.2 kg)   SpO2 98%   BMI 23.55 kg/m  BP Readings from Last 3 Encounters:  06/17/20 118/70  03/16/20 122/80  12/29/19 134/80   Wt Readings from Last 3 Encounters:  06/17/20 152 lb 9.6 oz (69.2 kg)  03/16/20 152 lb 3.2 oz (69 kg)    03/07/20 150 lb (68 kg)    Physical Exam Vitals reviewed.  Constitutional:      Appearance: She is well-developed.  Eyes:     Conjunctiva/sclera: Conjunctivae normal.  Cardiovascular:     Rate and Rhythm: Normal rate and regular rhythm.     Pulses: Normal pulses.     Heart sounds: Normal heart sounds.     Comments: No edema in calves. No palpable cords or masses. No erythema or increased warmth. No asymmetry in calf size when compared bilaterally LE hair growth symmetric and present. varicosities noted. LE warm and palpable pedal pulses.  Pulmonary:     Effort: Pulmonary effort is normal.     Breath sounds: Normal breath sounds. No wheezing, rhonchi or rales.  Musculoskeletal:       Feet:  Feet:     Comments: Circumscribed mass noted right lateral ankle distal to fibula. No increased warmth. Non tender. Non fluctuant. Ankle to move ankle freely.   Skin:    General: Skin is warm and dry.  Neurological:     Mental Status: She is alert.  Psychiatric:        Speech: Speech normal.        Behavior: Behavior normal.        Thought Content: Thought content normal.        Assessment & Plan:   Problem List Items Addressed This Visit      Musculoskeletal and Integument   Trochanteric bursitis of left hip    Improving. Following with orthopedics, will follow        Other   Former smoker   Relevant Orders   CBC with Differential/Platelet   Comprehensive metabolic panel   Lipid panel   TSH   VITAMIN D 25 Hydroxy (Vit-D Deficiency, Fractures)   Hemoglobin A1c   US [facility] MEDICARE SCREENING   Lung nodule    Repeat CT chest 2019 however unsure whether needs follow up surveillance. I have a sent a message to shawn perkins and advised patient to ensure that I call her with recommendation.       Right ankle swelling - Primary    Nonspecific etiology at this time, considering dependent edema, PVD,  however as mass felt more circumscribed than typical edema , question  whether lipoma. Pending XR and [place].       Relevant Orders   DG Ankle Complete Right   [place] MiscellaneoUS Localization       I am having Luberta Robertson maintain her Multiple Vitamin (MULTIVITAMIN  PO), levothyroxine, ergocalciferol, ibuprofen, nebivolol, and simvastatin.   No orders of the defined types were placed in this encounter.   Return precautions given.   Risks, benefits, and alternatives of the medications and treatment plan prescribed today were discussed, and patient expressed understanding.   Education regarding symptom management and diagnosis given to patient on AVS.  Continue to follow with Burnard Hawthorne, FNP for routine health maintenance.   Meghan Mack and I agreed with plan.   Mable Paris, FNP

## 2020-06-17 NOTE — Patient Instructions (Addendum)
Continue compression stockings, low salt.  I have ordered screen for aneurysm and ultrasound of right ankle.  Let us know if you dont hear back within a week in regards to an appointment being scheduled.   I also sent Shawn with Lung Cancer Screening Program a message about repeating your CT chest and enrolling you in screening program. Let me know if you dont hear back from me in one week regarding this.   Stay safe!

## 2020-06-17 NOTE — Assessment & Plan Note (Signed)
Nonspecific etiology at this time, considering dependent edema, PVD,  however as mass felt more circumscribed than typical edema , question whether lipoma. Pending XR and [place].

## 2020-06-17 NOTE — Telephone Encounter (Signed)
error 

## 2020-06-17 NOTE — Assessment & Plan Note (Signed)
Repeat CT chest 2019 however unsure whether needs follow up surveillance. I have a sent a message to shawn perkins and advised patient to ensure that I call her with recommendation.

## 2020-06-21 ENCOUNTER — Telehealth: Payer: Self-pay | Admitting: Family

## 2020-06-21 ENCOUNTER — Other Ambulatory Visit: Payer: Self-pay | Admitting: Family

## 2020-06-21 DIAGNOSIS — R911 Solitary pulmonary nodule: Secondary | ICD-10-CM

## 2020-06-21 DIAGNOSIS — Z87891 Personal history of nicotine dependence: Secondary | ICD-10-CM

## 2020-06-21 DIAGNOSIS — M25471 Effusion, right ankle: Secondary | ICD-10-CM

## 2020-06-21 NOTE — Telephone Encounter (Signed)
-----   Message from Lieutenant Diego, RN sent at 06/21/2020  9:19 AM EDT ----- With a quit date > 15 years ago she wouldn't be a candidate for lung screening but sounds like she would be great in the lung nodule program. Would that be okay with you for me to forward to them? Shawn ----- Message ----- From: Burnard Hawthorne, FNP Sent: 06/17/2020  10:54 AM EDT To: Lieutenant Diego, RN  Hi shawn,   Patient and former smoker here today and we are discussing prior CT 2019 with lung nodules and former smoker ( quit > 15 years ago). She was lost to follow up with dr simonds.   Should we pursue CT lung cancer screening program?  Thanks for you advice here! Meghan Mack

## 2020-06-21 NOTE — Telephone Encounter (Signed)
I called & let patient know that she should get a call about getting scheduled for the lung nodule program. She was asking what they do differently that the CT lung cancer screenings? I advised that honestly I was not sure & would try to get clarification for her.

## 2020-06-21 NOTE — Telephone Encounter (Signed)
Patient called & informed of below. Pt thanked [place] for clarification.

## 2020-06-21 NOTE — Telephone Encounter (Signed)
Call pt Lung nodule program will monitor nodules, whereas lung cancer screen is more for folks with inc risk of lung cancer due to smoking

## 2020-06-21 NOTE — Telephone Encounter (Signed)
Call pt Let her know that I spoke with lung cancer screening program and since her quit date > 15 years, she doesn't qualify for low dose CT chest annually HOWEVER due to h/o lung nodule, shawn at Cancer center did advise that she would be part of the lung nodule surveillance. I have placed referral  Let us know if you dont hear back within a week in regards to an appointment being scheduled.        Message from Lieutenant Diego, RN sent at 06/21/2020  9:19 AM EDT ----- With a quit date > 15 years ago she wouldn't be a candidate for lung screening but sounds like she would be great in the lung nodule program. Would that be okay with you for me to forward to them? Shawn

## 2020-06-22 ENCOUNTER — Encounter: Payer: Self-pay | Admitting: Dermatology

## 2020-06-28 ENCOUNTER — Ambulatory Visit
Admission: RE | Admit: 2020-06-28 | Discharge: 2020-06-28 | Disposition: A | Discharge status: Home / Self Care | Payer: Medicare Other | Source: Ambulatory Visit | Attending: Family | Admitting: Family

## 2020-06-28 ENCOUNTER — Other Ambulatory Visit: Payer: Self-pay

## 2020-06-28 DIAGNOSIS — I700 Atherosclerosis of aorta: Secondary | ICD-10-CM | POA: Diagnosis not present

## 2020-06-28 DIAGNOSIS — Z87891 Personal history of nicotine dependence: Secondary | ICD-10-CM | POA: Insufficient documentation

## 2020-06-28 DIAGNOSIS — M25471 Effusion, right ankle: Secondary | ICD-10-CM | POA: Diagnosis not present

## 2020-06-28 DIAGNOSIS — R2241 Localized swelling, mass and lump, right lower limb: Secondary | ICD-10-CM | POA: Diagnosis not present

## 2020-07-04 ENCOUNTER — Other Ambulatory Visit: Payer: Medicare Other

## 2020-07-04 ENCOUNTER — Other Ambulatory Visit: Payer: Self-pay

## 2020-07-04 DIAGNOSIS — Z20822 Contact with and (suspected) exposure to covid-19: Secondary | ICD-10-CM

## 2020-07-05 ENCOUNTER — Other Ambulatory Visit: Payer: Self-pay | Admitting: Oncology

## 2020-07-05 DIAGNOSIS — R911 Solitary pulmonary nodule: Secondary | ICD-10-CM

## 2020-07-05 LAB — SARS-COV-2, NAA 2 DAY TAT

## 2020-07-05 LAB — NOVEL CORONAVIRUS, NAA: SARS-CoV-2, NAA: NOT DETECTED
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
## 2020-07-06 ENCOUNTER — Telehealth: Payer: Self-pay | Admitting: *Deleted

## 2020-07-06 NOTE — Telephone Encounter (Signed)
Pt made aware of referral to Lung Nodule Clinic from PCP. Pt is in agreement to have upcoming CT scan and follow up as recommended.  Pt has been made aware of upcoming appts for follow up CT scan and follow up appt with Jennifer Burns, NP in the Lung Nodule Clinic. Pt verbalized understanding. Nothing further needed at this time.  

## 2020-07-07 ENCOUNTER — Other Ambulatory Visit (INDEPENDENT_AMBULATORY_CARE_PROVIDER_SITE_OTHER): Payer: Medicare Other

## 2020-07-07 ENCOUNTER — Other Ambulatory Visit: Payer: Self-pay

## 2020-07-07 DIAGNOSIS — Z87891 Personal history of nicotine dependence: Secondary | ICD-10-CM | POA: Diagnosis not present

## 2020-07-07 LAB — CBC WITH DIFFERENTIAL/PLATELET
Basophils Absolute: 0 10*3/uL (ref 0.0–0.1)
Basophils Relative: 0.5 % (ref 0.0–3.0)
Eosinophils Absolute: 0 10*3/uL (ref 0.0–0.7)
Eosinophils Relative: 0.7 % (ref 0.0–5.0)
HCT: 38 % (ref 36.0–46.0)
Hemoglobin: 13.1 g/dL (ref 12.0–15.0)
Lymphocytes Relative: 22.8 % (ref 12.0–46.0)
Lymphs Abs: 1.1 10*3/uL (ref 0.7–4.0)
MCHC: 34.5 g/dL (ref 30.0–36.0)
MCV: 96.7 fl (ref 78.0–100.0)
Monocytes Absolute: 0.2 10*3/uL (ref 0.1–1.0)
Monocytes Relative: 5.3 % (ref 3.0–12.0)
Neutro Abs: 3.3 10*3/uL (ref 1.4–7.7)
Neutrophils Relative %: 70.7 % (ref 43.0–77.0)
Platelets: 171 10*3/uL (ref 150.0–400.0)
RBC: 3.93 Mil/uL (ref 3.87–5.11)
RDW: 12.8 % (ref 11.5–15.5)
WBC: 4.7 10*3/uL (ref 4.0–10.5)

## 2020-07-07 LAB — LIPID PANEL
Cholesterol: 154 mg/dL (ref 0–200)
HDL: 75.4 mg/dL (ref >39.00)
LDL Cholesterol: 64 mg/dL (ref 0–99)
NonHDL: 78.15
Total CHOL/HDL Ratio: 2
Triglycerides: 72 mg/dL (ref 0.0–149.0)
VLDL: 14.4 mg/dL (ref 0.0–40.0)

## 2020-07-07 LAB — COMPREHENSIVE METABOLIC PANEL
ALT: 18 U/L (ref 0–35)
AST: 21 U/L (ref 0–37)
Albumin: 4.2 g/dL (ref 3.5–5.2)
Alkaline Phosphatase: 87 U/L (ref 39–117)
BUN: 8 mg/dL (ref 6–23)
CO2: 29 mEq/L (ref 19–32)
Calcium: 9.1 mg/dL (ref 8.4–10.5)
Chloride: 105 mEq/L (ref 96–112)
Creatinine, Ser: 0.7 mg/dL (ref 0.40–1.20)
GFR: 82.64 mL/min (ref >60.00)
Glucose, Bld: 99 mg/dL (ref 70–99)
Potassium: 3.9 mEq/L (ref 3.5–5.1)
Sodium: 141 mEq/L (ref 135–145)
Total Bilirubin: 0.8 mg/dL (ref 0.2–1.2)
Total Protein: 6.6 g/dL (ref 6.0–8.3)

## 2020-07-07 LAB — VITAMIN D 25 HYDROXY (VIT D DEFICIENCY, FRACTURES): VITD: 47.27 ng/mL (ref 30.00–100.00)

## 2020-07-07 LAB — HEMOGLOBIN A1C: Hgb A1c MFr Bld: 5.7 % (ref 4.6–6.5)

## 2020-07-07 LAB — TSH: TSH: 1.26 u[IU]/mL (ref 0.35–4.50)

## 2020-07-08 ENCOUNTER — Encounter: Payer: Self-pay | Admitting: Family

## 2020-07-10 NOTE — Progress Notes (Signed)
Cardiology Office Note  Date:  07/11/2020   ID:  Meghan, Mack [birth year], MRN [id number]  PCP:  Burnard Hawthorne, FNP   Chief Complaint  Patient presents with  . office visit    12 month F/U-Patient reports "waking up in the middle of the night and feeling her heart racing" when she lays on her left side; Meds verbally reviewed with patient.    HPI:  [name] is a very pleasant 69 year old woman with a history of  palpitations,  anxiety,  remote history of chest pain,  hyperlipidemia  Prior 30 day monitor in 2014 showed APCs and short runs of atrial tachycardia/SVT with rate 166 bpm hypertension  mild coronary calcification of the LAD and RCA on CT MRI with moderate stenosis cerebral vascular disease who presents for routine followup  Of her coronary calcifications, Aortic atherosclerosis, tachycardia peripheral arterial disease  Last seen in clinic September 2020 That time reported Zetia was causing stomach cramps Was having rare tachycardia, takes low-dose beta-blocker Prior CT scan reviewed showing mild coronary calcification LAD, RCA, aortic atherosclerosis mild Was wearing compression hose  Tachycardia at night, wakes her up Goes away without intervention Worried about low heart rate at night, then these spikes in her right at times, Wonders if it could be from anxiety  Otherwise active no complaints Lab work discussed with her  Recent lab work reviewed total cholesterol 154 LDL 64 Normal BMP Hemoglobin A1c 5.7  EKG personally reviewed by myself on todays visit Normal sinus rhythm rate 79 bpm no significant ST or T wave changes  Past medical history reviewed MRI, was having sharp pain in head Moderate stenosis right P2 segment Moderate to severe stenosis left internal carotid artery at the skull base and moderate stenosis right internal carotid artery at the skull base. Mild atherosclerotic disease right anterior cerebral Artery.   PMH:    has a past medical history of Anemia, Colonic polyp, Constipation, COPD (chronic obstructive pulmonary disease) ([place]), Hyperlipidemia, IBS (irritable bowel syndrome), Kidney cysts, Lung nodule, Meningioma ([facility]), Nausea, Palpitations, and Tonsillar cyst.  PSH:    Past Surgical History:  Procedure Laterality Date  . CARDIAC CATHETERIZATION  02/25/2007  . COLONOSCOPY    . ESOPHAGOGASTRODUODENOSCOPY  10/1997 & 08/2005  . FOOT SURGERY  04/25/2007   right  . THYROIDECTOMY  11/2005  . VAGINAL HYSTERECTOMY     ovaries intact    Current Outpatient Medications  Medication Sig Dispense Refill  . ergocalciferol (VITAMIN D2) 50000 UNITS capsule Take 50,000 Units by mouth every 30 (thirty) days.    Marland Kitchen ibuprofen (ADVIL,MOTRIN) 600 MG tablet Take 1 tablet (600 mg total) by mouth every 8 (eight) hours as needed. 20 tablet 0  . levothyroxine (SYNTHROID, LEVOTHROID) 88 MCG tablet Take 1 tablet 6 days a week.    . Multiple Vitamin (MULTIVITAMIN PO) Take 1 tablet by mouth daily.      . nebivolol (BYSTOLIC) 10 MG tablet Take 0.5 tablets (5 mg total) by mouth 2 (two) times daily. 90 tablet 3  . simvastatin (ZOCOR) 20 MG tablet TAKE 1 TABLET (20 MG) EVERY EVENING (IMPROVES CHOLESTEROL) 90 tablet 1   No current facility-administered medications for this visit.     Allergies:   Tdap [tetanus-diphth-acell pertussis], Codeine, Contrast media [iodinated diagnostic agents], Diflucan [fluconazole], Esomeprazole magnesium, Meperidine hcl, Pneumovax 23 [pneumococcal vac polyvalent], Propantheline bromide, Propoxyphene n-acetaminophen, Sulfa antibiotics, and Sulfonamide derivatives   Social History:  The patient  reports that she quit smoking about 22 years ago.  Her smoking use included cigarettes. She has a 2.50 pack-year smoking history. She has never used smokeless tobacco. She reports that she does not drink alcohol and does not use drugs.   Family History:   family history includes Cancer in her brother, father,  maternal aunt, maternal grandfather, and mother; Emphysema in her father; Heart disease in her father.    Review of Systems: Review of Systems  Constitutional: Negative.   HENT: Negative.   Respiratory: Negative.   Cardiovascular: Negative.   Gastrointestinal: Negative.   Musculoskeletal: Negative.   Neurological: Negative.   Psychiatric/Behavioral: Negative.   All other systems reviewed and are negative.   PHYSICAL EXAM: VS:  BP 134/90 (BP Location: Left Arm, Patient Position: Sitting, Cuff Size: Normal)   Pulse 79   Ht 5' 7.5" (1.715 m)   Wt 150 lb 6 oz (68.2 kg)   SpO2 98%   BMI 23.20 kg/m  , BMI Body mass index is 23.2 kg/m. Constitutional:  oriented to person, place, and time. No distress.  HENT:  Head: Grossly normal Eyes:  no discharge. No scleral icterus.  Neck: No JVD, no carotid bruits  Cardiovascular: Regular rate and rhythm, no murmurs appreciated Pulmonary/Chest: Clear to auscultation bilaterally, no wheezes or rails Abdominal: Soft.  no distension.  no tenderness.  Musculoskeletal: Normal range of motion Neurological:  normal muscle tone. Coordination normal. No atrophy Skin: Skin warm and dry Psychiatric: normal affect, pleasant   Recent Labs: 07/07/2020: ALT 18; BUN 8; Creatinine, Ser 0.70; Hemoglobin 13.1; Platelets 171.0; Potassium 3.9; Sodium 141; TSH 1.26    Lipid Panel Lab Results  Component Value Date   CHOL 154 07/07/2020   HDL 75.40 07/07/2020   LDLCALC 64 07/07/2020   TRIG 72.0 07/07/2020      Wt Readings from Last 3 Encounters:  07/11/20 150 lb 6 oz (68.2 kg)  06/17/20 152 lb 9.6 oz (69.2 kg)  03/16/20 152 lb 3.2 oz (69 kg)      ASSESSMENT AND PLAN:  Peripheral arterial disease Significant carotid stenosis per MRI  mild coronary calcification, aortic atherosclerosis She is working on her diet and exercise, cholesterol close to goal Various discussions concerning changing to alternate statin for more aggressive numbers, keep  evaluate is for now She would like to see numbers again in 3 months time to see if her efforts of dietary changes and exercise paying off  Tachycardia - Plan: EKG 12-Lead Previous event monitor showing 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  Some tachycardia at night, discussed a monitoring device such as a watch to track heart rate overnight Potentially could take extra beta-blocker She would change the timing of her beta-blockers later into the day   Obstructive sleep apnea - Plan: EKG 12-Lead Some tachycardia and waking at night  TOBACCO ABUSE - Plan: EKG 12-Lead Prior history of smoking Stopped smoking years ago  Mixed hyperlipidemia - Plan: EKG 12-Lead Did not tolerate Zetia, simvastatin 20 for now, dietary changes, exercise  CAD mildcoronary calcification and mild to moderate diffuse aortic atherosclerosis Continue aggressive lipid management As above     Total encounter time more than 25 minutes  Greater than 50% was spent in counseling and coordination of care with the patient    Orders Placed This Encounter  Procedures  . EKG 12-Lead     Signed, Esmond Plants, M.D., Ph.D. 07/11/2020  [place], [place]

## 2020-07-11 ENCOUNTER — Ambulatory Visit: Payer: Medicare Other | Admitting: Cardiovascular Disease

## 2020-07-11 ENCOUNTER — Other Ambulatory Visit: Payer: Medicare Other

## 2020-07-11 ENCOUNTER — Encounter: Payer: Self-pay | Admitting: Cardiovascular Disease

## 2020-07-11 ENCOUNTER — Other Ambulatory Visit: Payer: Self-pay

## 2020-07-11 VITALS — BP 134/90 | HR 79 | Ht 67.5 in | Wt 150.4 lb

## 2020-07-11 DIAGNOSIS — I2510 Atherosclerotic heart disease of native coronary artery without angina pectoris: Secondary | ICD-10-CM | POA: Diagnosis not present

## 2020-07-11 DIAGNOSIS — I471 Supraventricular tachycardia, unspecified: Secondary | ICD-10-CM

## 2020-07-11 DIAGNOSIS — I739 Peripheral vascular disease, unspecified: Secondary | ICD-10-CM | POA: Diagnosis not present

## 2020-07-11 DIAGNOSIS — E782 Mixed hyperlipidemia: Secondary | ICD-10-CM

## 2020-07-11 DIAGNOSIS — I700 Atherosclerosis of aorta: Secondary | ICD-10-CM

## 2020-07-11 DIAGNOSIS — G4733 Obstructive sleep apnea (adult) (pediatric): Secondary | ICD-10-CM

## 2020-07-11 DIAGNOSIS — F17200 Nicotine dependence, unspecified, uncomplicated: Secondary | ICD-10-CM | POA: Diagnosis not present

## 2020-07-11 NOTE — Patient Instructions (Addendum)
Labs as below  Medication Instructions:  No changes  If you need a refill on your cardiac medications before your next appointment, please call your pharmacy.    Lab work: Lipids before Christmas   If you have labs (blood work) drawn today and your tests are completely normal, you will receive your results only by: Marland Kitchen MyChart Message (if you have MyChart) OR . A paper copy in the mail If you have any lab test that is abnormal or we need to change your treatment, we will call you to review the results.   Testing/Procedures: No new testing needed   Follow-Up: At [facility], you and your health needs are our priority.  As part of our continuing mission to provide you with exceptional heart care, we have created designated Provider Care Teams.  These Care Teams include your primary Cardiologist (physician) and Advanced Practice Providers (APPs -  Physician Assistants and Nurse Practitioners) who all work together to provide you with the care you need, when you need it.  . You will need a follow up appointment in 12 months  . Providers on your designated Care Team:   . Murray Hodgkins, NP . Christell Faith, PA-C . Marrianne Mood, PA-C  Any Other Special Instructions Will Be Listed Below (If Applicable).  COVID-19 Vaccine Information can be found at: ShippingScam.co.uk For questions related to vaccine distribution or appointments, please email vaccine@[email or web address] .[email or web address] or call [phone].

## 2020-07-19 ENCOUNTER — Ambulatory Visit
Admission: RE | Admit: 2020-07-19 | Discharge: 2020-07-19 | Disposition: A | Discharge status: Home / Self Care | Payer: Medicare Other | Source: Ambulatory Visit | Attending: Oncology | Admitting: Oncology

## 2020-07-19 ENCOUNTER — Other Ambulatory Visit: Payer: Self-pay

## 2020-07-19 DIAGNOSIS — I2510 Atherosclerotic heart disease of native coronary artery without angina pectoris: Secondary | ICD-10-CM | POA: Diagnosis not present

## 2020-07-19 DIAGNOSIS — J984 Other disorders of lung: Secondary | ICD-10-CM | POA: Diagnosis not present

## 2020-07-19 DIAGNOSIS — I700 Atherosclerosis of aorta: Secondary | ICD-10-CM | POA: Diagnosis not present

## 2020-07-19 DIAGNOSIS — R911 Solitary pulmonary nodule: Secondary | ICD-10-CM | POA: Diagnosis not present

## 2020-07-19 DIAGNOSIS — E890 Postprocedural hypothyroidism: Secondary | ICD-10-CM | POA: Diagnosis not present

## 2020-07-20 ENCOUNTER — Inpatient Hospital Stay: Payer: Medicare Other | Attending: Oncology | Admitting: Oncology

## 2020-07-20 DIAGNOSIS — R911 Solitary pulmonary nodule: Secondary | ICD-10-CM

## 2020-07-20 DIAGNOSIS — M8580 Other specified disorders of bone density and structure, unspecified site: Secondary | ICD-10-CM | POA: Diagnosis not present

## 2020-07-20 DIAGNOSIS — Z791 Long term (current) use of non-steroidal anti-inflammatories (NSAID): Secondary | ICD-10-CM | POA: Diagnosis not present

## 2020-07-20 DIAGNOSIS — J449 Chronic obstructive pulmonary disease, unspecified: Secondary | ICD-10-CM | POA: Insufficient documentation

## 2020-07-20 DIAGNOSIS — Z8249 Family history of ischemic heart disease and other diseases of the circulatory system: Secondary | ICD-10-CM | POA: Insufficient documentation

## 2020-07-20 DIAGNOSIS — Z79899 Other long term (current) drug therapy: Secondary | ICD-10-CM | POA: Diagnosis not present

## 2020-07-20 DIAGNOSIS — I2510 Atherosclerotic heart disease of native coronary artery without angina pectoris: Secondary | ICD-10-CM | POA: Diagnosis not present

## 2020-07-20 DIAGNOSIS — Z806 Family history of leukemia: Secondary | ICD-10-CM | POA: Insufficient documentation

## 2020-07-20 DIAGNOSIS — Z800 Family history of malignant neoplasm of digestive organs: Secondary | ICD-10-CM | POA: Insufficient documentation

## 2020-07-20 DIAGNOSIS — Z87891 Personal history of nicotine dependence: Secondary | ICD-10-CM | POA: Insufficient documentation

## 2020-07-20 DIAGNOSIS — Z801 Family history of malignant neoplasm of trachea, bronchus and lung: Secondary | ICD-10-CM | POA: Insufficient documentation

## 2020-07-20 DIAGNOSIS — E785 Hyperlipidemia, unspecified: Secondary | ICD-10-CM | POA: Insufficient documentation

## 2020-07-20 DIAGNOSIS — G4733 Obstructive sleep apnea (adult) (pediatric): Secondary | ICD-10-CM | POA: Insufficient documentation

## 2020-07-20 DIAGNOSIS — Z90710 Acquired absence of both cervix and uterus: Secondary | ICD-10-CM | POA: Insufficient documentation

## 2020-07-20 DIAGNOSIS — E890 Postprocedural hypothyroidism: Secondary | ICD-10-CM | POA: Insufficient documentation
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
## 2020-07-21 ENCOUNTER — Ambulatory Visit: Payer: Medicare Other | Admitting: Oncology

## 2020-08-01 ENCOUNTER — Ambulatory Visit: Payer: Medicare Other | Attending: Internal Medicine

## 2020-08-01 ENCOUNTER — Other Ambulatory Visit: Payer: Self-pay | Admitting: Cardiovascular Disease

## 2020-08-01 DIAGNOSIS — Z23 Encounter for immunization: Secondary | ICD-10-CM

## 2020-08-01 MED ORDER — NEBIVOLOL HCL 10 MG PO TABS
5.0000 mg | ORAL_TABLET | Freq: Two times a day (BID) | ORAL | 2 refills | Status: DC
Start: 2020-08-01 — End: 2021-05-15

## 2020-08-01 NOTE — Progress Notes (Signed)
   Covid-19 Vaccination Clinic  Name:  Mayuri Staples    MRN: [id number] DOB: 1951/07/23  08/01/2020  Ms. Bergemann was observed post Covid-19 immunization for 15 minutes without incident. She was provided with Vaccine Information Sheet and instruction to access the V-Safe system.   Ms. Temkin was instructed to call 911 with any severe reactions post vaccine: Marland Kitchen Difficulty breathing  . Swelling of face and throat  . A fast heartbeat  . A bad rash all over body  . Dizziness and weakness

## 2020-08-01 NOTE — Telephone Encounter (Signed)
°*  STAT* If patient is at the pharmacy, call can be transferred to refill team.   1. Which medications need to be refilled? (please list name of each medication and dose if known)   Bystolic 5 mg po BID  2. Which pharmacy/location (including street and city if local pharmacy) is medication to be sent to?  [address]   NOT [facility]   3. Do they need a 30 day or 90 day supply? [place]

## 2020-08-01 NOTE — Telephone Encounter (Signed)
Requested Prescriptions   Signed Prescriptions Disp Refills   nebivolol (BYSTOLIC) 10 MG tablet 90 tablet 2    Sig: Take 0.5 tablets (5 mg total) by mouth 2 (two) times daily.    Authorizing Provider: Minna Merritts    Ordering User: Raelene Bott, Lavonna Lampron L

## 2020-08-02 ENCOUNTER — Other Ambulatory Visit: Payer: Self-pay | Admitting: Family

## 2020-08-02 DIAGNOSIS — Z1231 Encounter for screening mammogram for malignant neoplasm of breast: Secondary | ICD-10-CM

## 2020-08-05 ENCOUNTER — Other Ambulatory Visit: Payer: Self-pay | Admitting: Adult Health Nurse Practitioner

## 2020-08-05 DIAGNOSIS — J387 Other diseases of larynx: Secondary | ICD-10-CM | POA: Diagnosis not present

## 2020-08-05 DIAGNOSIS — J384 Edema of larynx: Secondary | ICD-10-CM | POA: Diagnosis not present

## 2020-08-17 ENCOUNTER — Ambulatory Visit: Payer: Medicare Other | Admitting: Dermatology

## 2020-08-17 ENCOUNTER — Other Ambulatory Visit: Payer: Self-pay

## 2020-08-17 DIAGNOSIS — L820 Inflamed seborrheic keratosis: Secondary | ICD-10-CM | POA: Diagnosis not present

## 2020-08-17 DIAGNOSIS — L578 Other skin changes due to chronic exposure to nonionizing radiation: Secondary | ICD-10-CM

## 2020-08-17 DIAGNOSIS — L821 Other seborrheic keratosis: Secondary | ICD-10-CM

## 2020-08-17 DIAGNOSIS — L570 Actinic keratosis: Secondary | ICD-10-CM | POA: Diagnosis not present

## 2020-08-17 IMAGING — CR DG HIP (WITH OR WITHOUT PELVIS) 2-3V*R*
1 series · 3 of 3 positions shown · non-contrast
Comparison: None.

CLINICAL DATA: Bilateral groin pain and mid back pain for 6 months.
No injury.

EXAM:
DG HIP (WITH OR WITHOUT PELVIS) 2-3V RIGHT

[Series 1: dg hip unilat w or w/o pelvis 2-3 views  · non-contrast · 0.14mm/px · 3 of 3 slices shown]
[im 1/3]
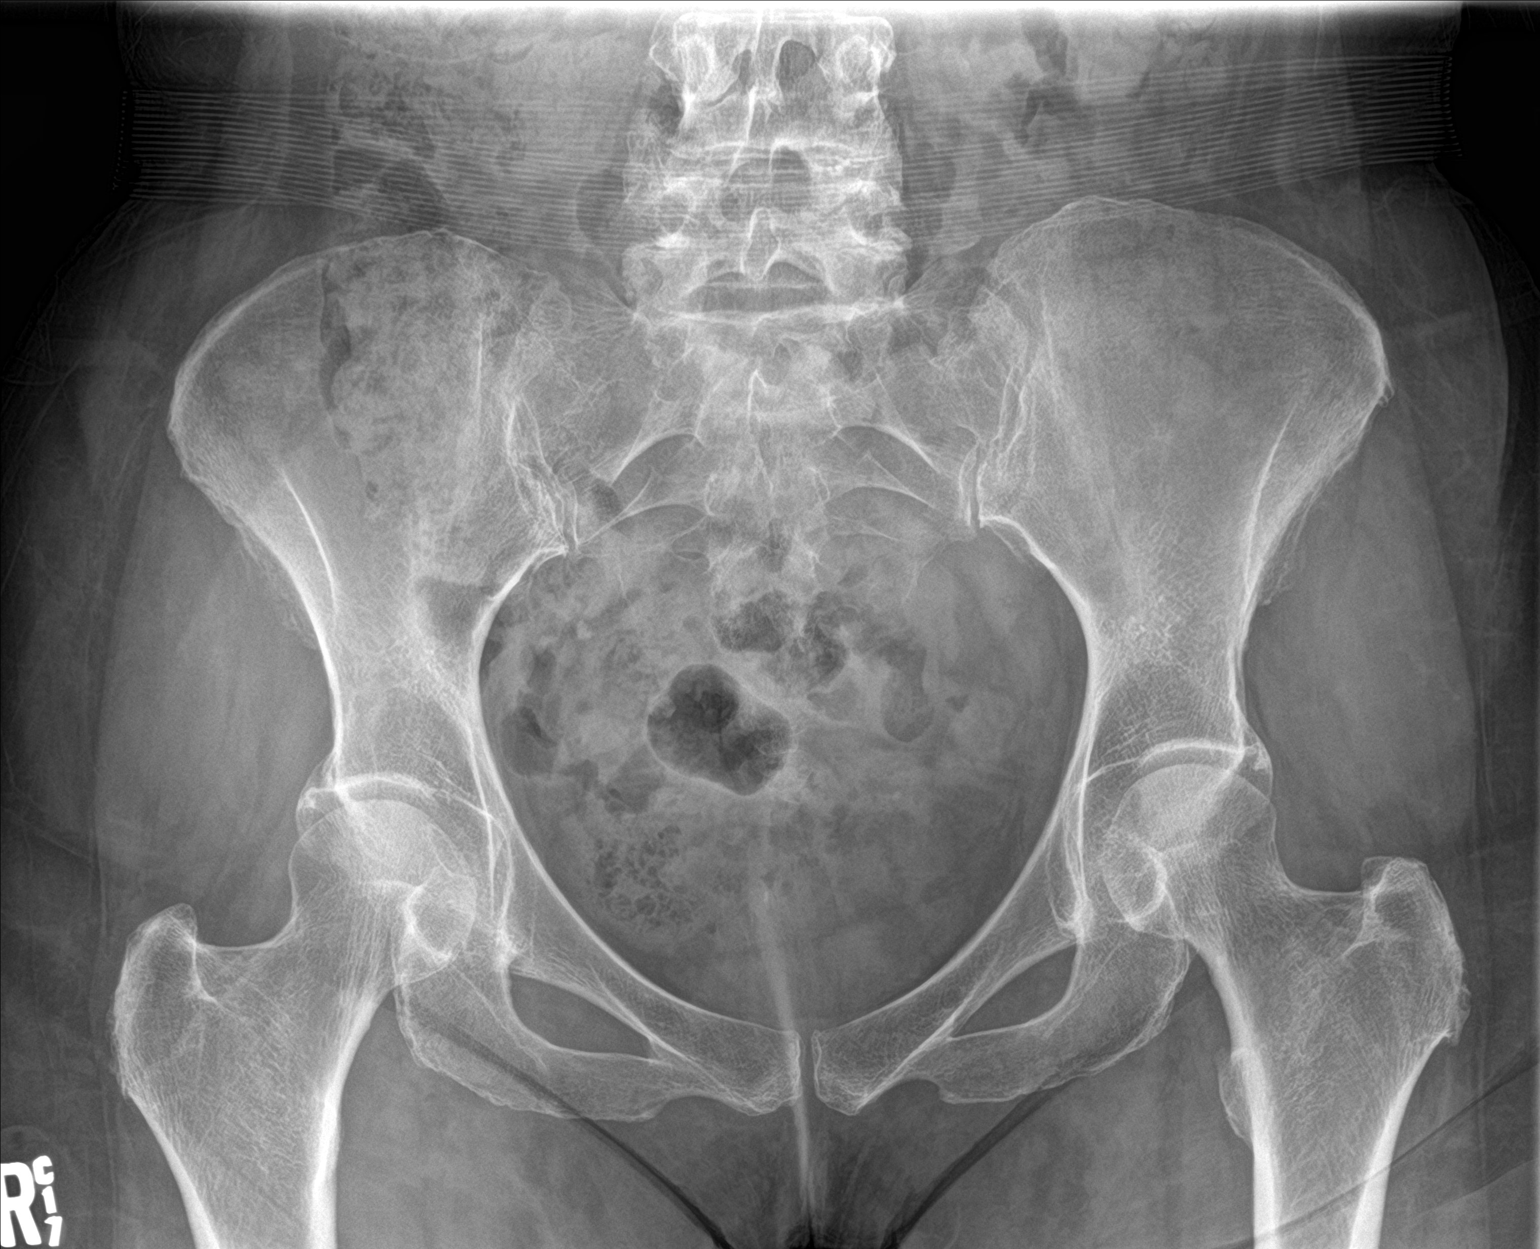
[im 2/3]
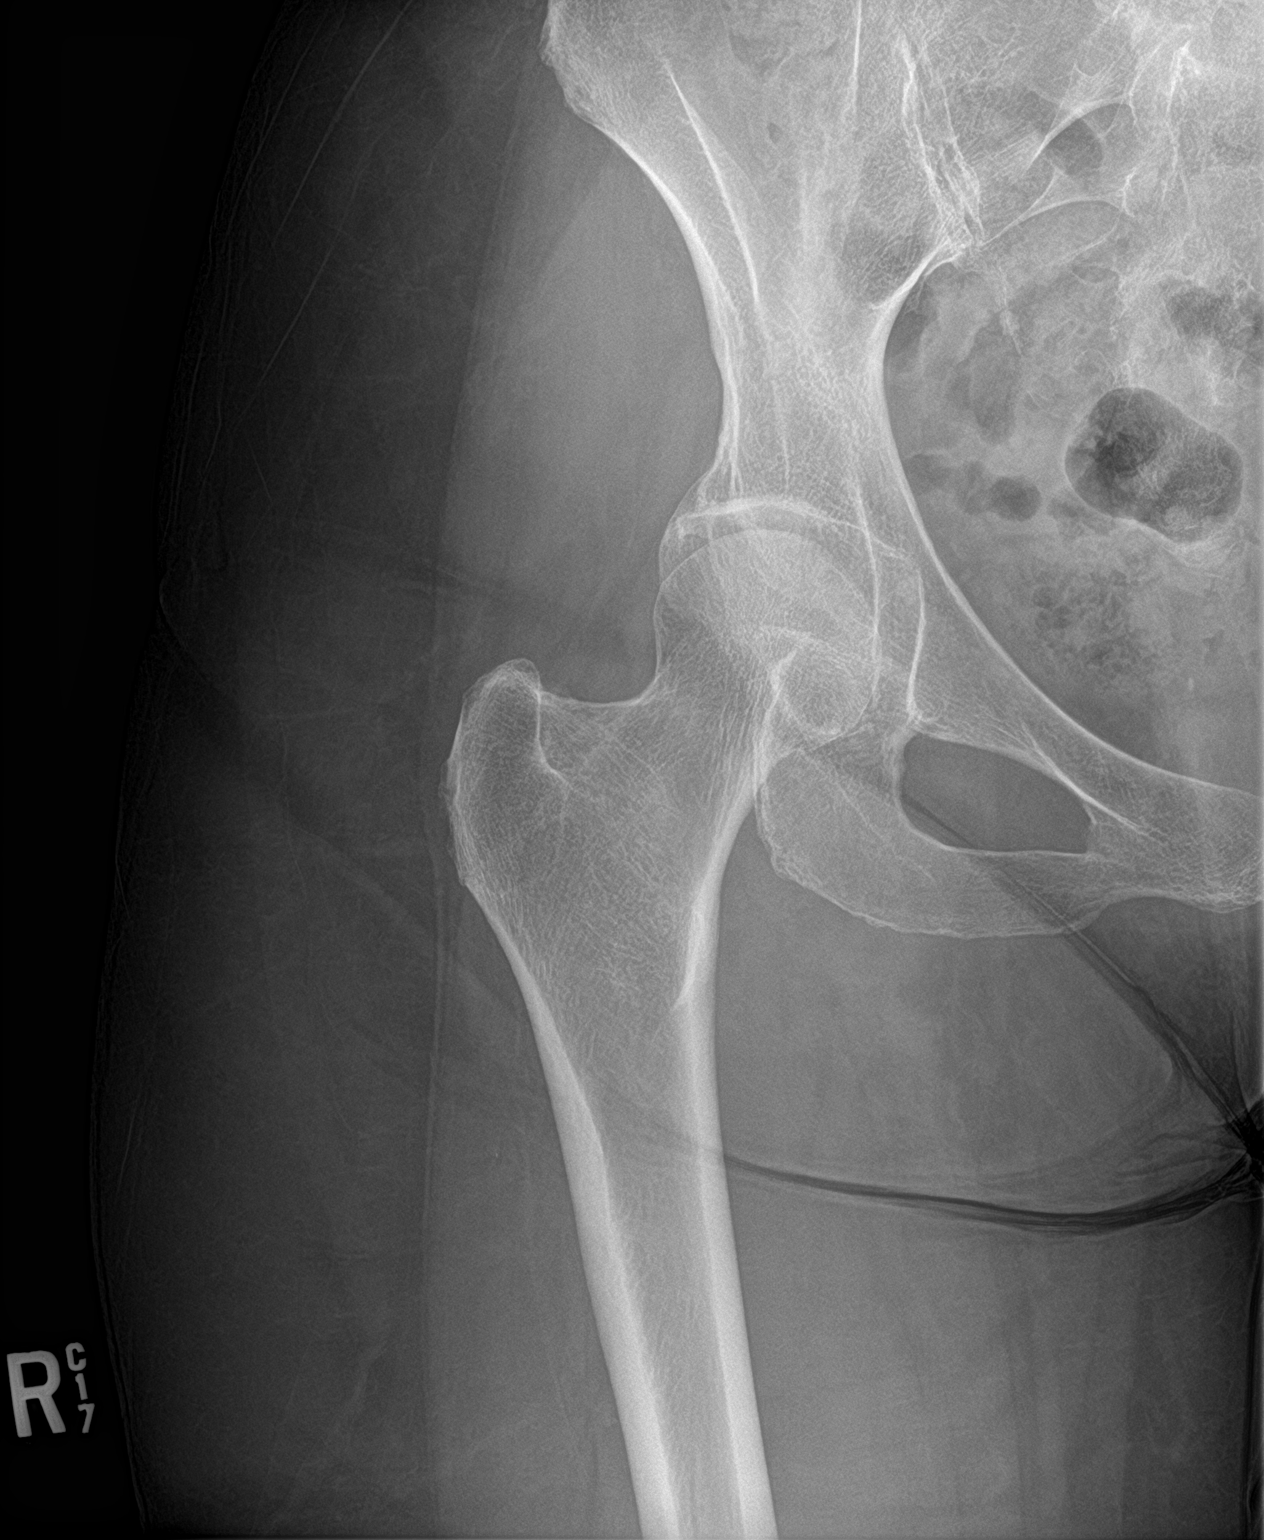
[im 3/3]
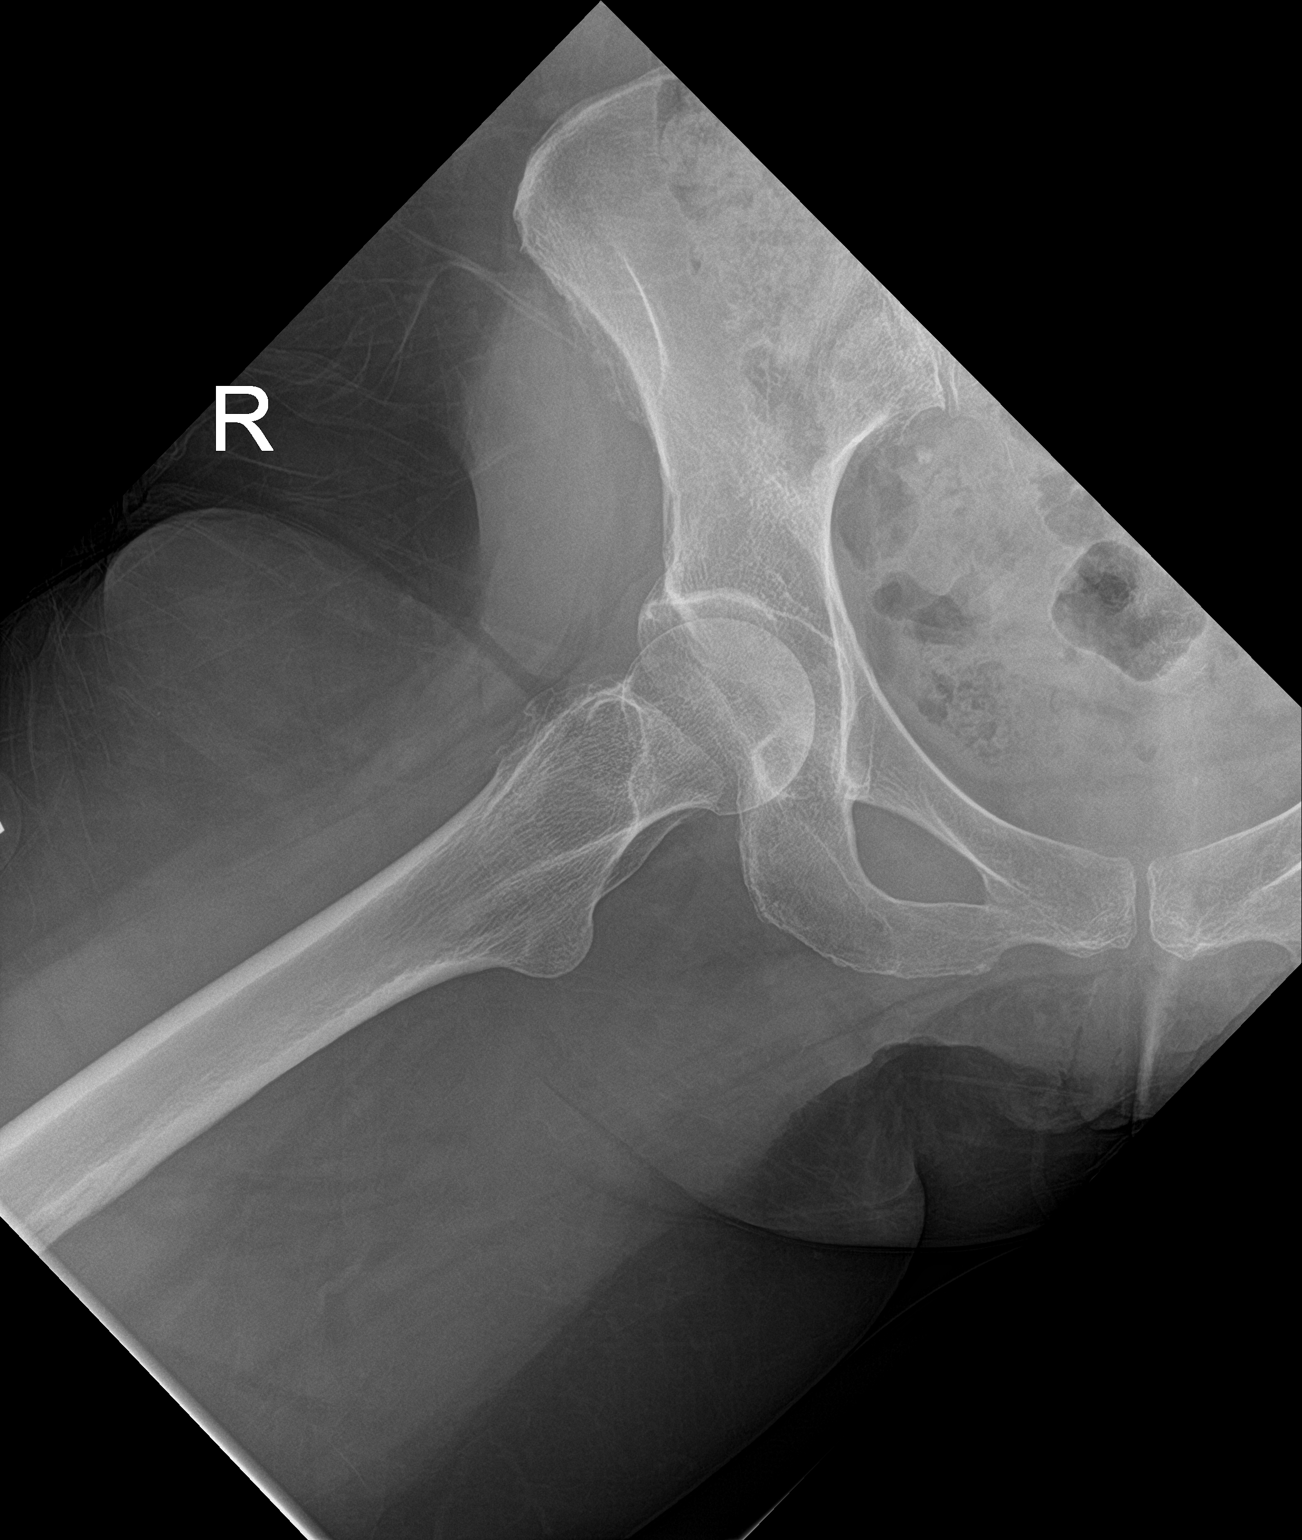

[3 of 3 positions shown; findings below may reference images not displayed]

FINDINGS: Hip joint space is uniform bilaterally. No associated degenerative
changes. There are degenerative changes in the lower lumbar spine,
incidentally imaged.
IMPRESSION: 1. Normal right hip.
2. Degenerative changes in the visualized lumbar spine. Lumbar spine
series dictated separately.

## 2020-08-17 IMAGING — CR DG HIP (WITH OR WITHOUT PELVIS) 2-3V*L*
1 series · 3 of 3 positions shown · non-contrast
Comparison: 12/31/2018.

CLINICAL DATA: Bilateral groin pain and mid back pain for 6 months,
no injury.

EXAM:
DG HIP (WITH OR WITHOUT PELVIS) 2-3V LEFT

[Series 1: dg hip unilat w or w/o pelvis 2-3 views  · non-contrast · 0.14mm/px · 3 of 3 slices shown]
[im 1/3]
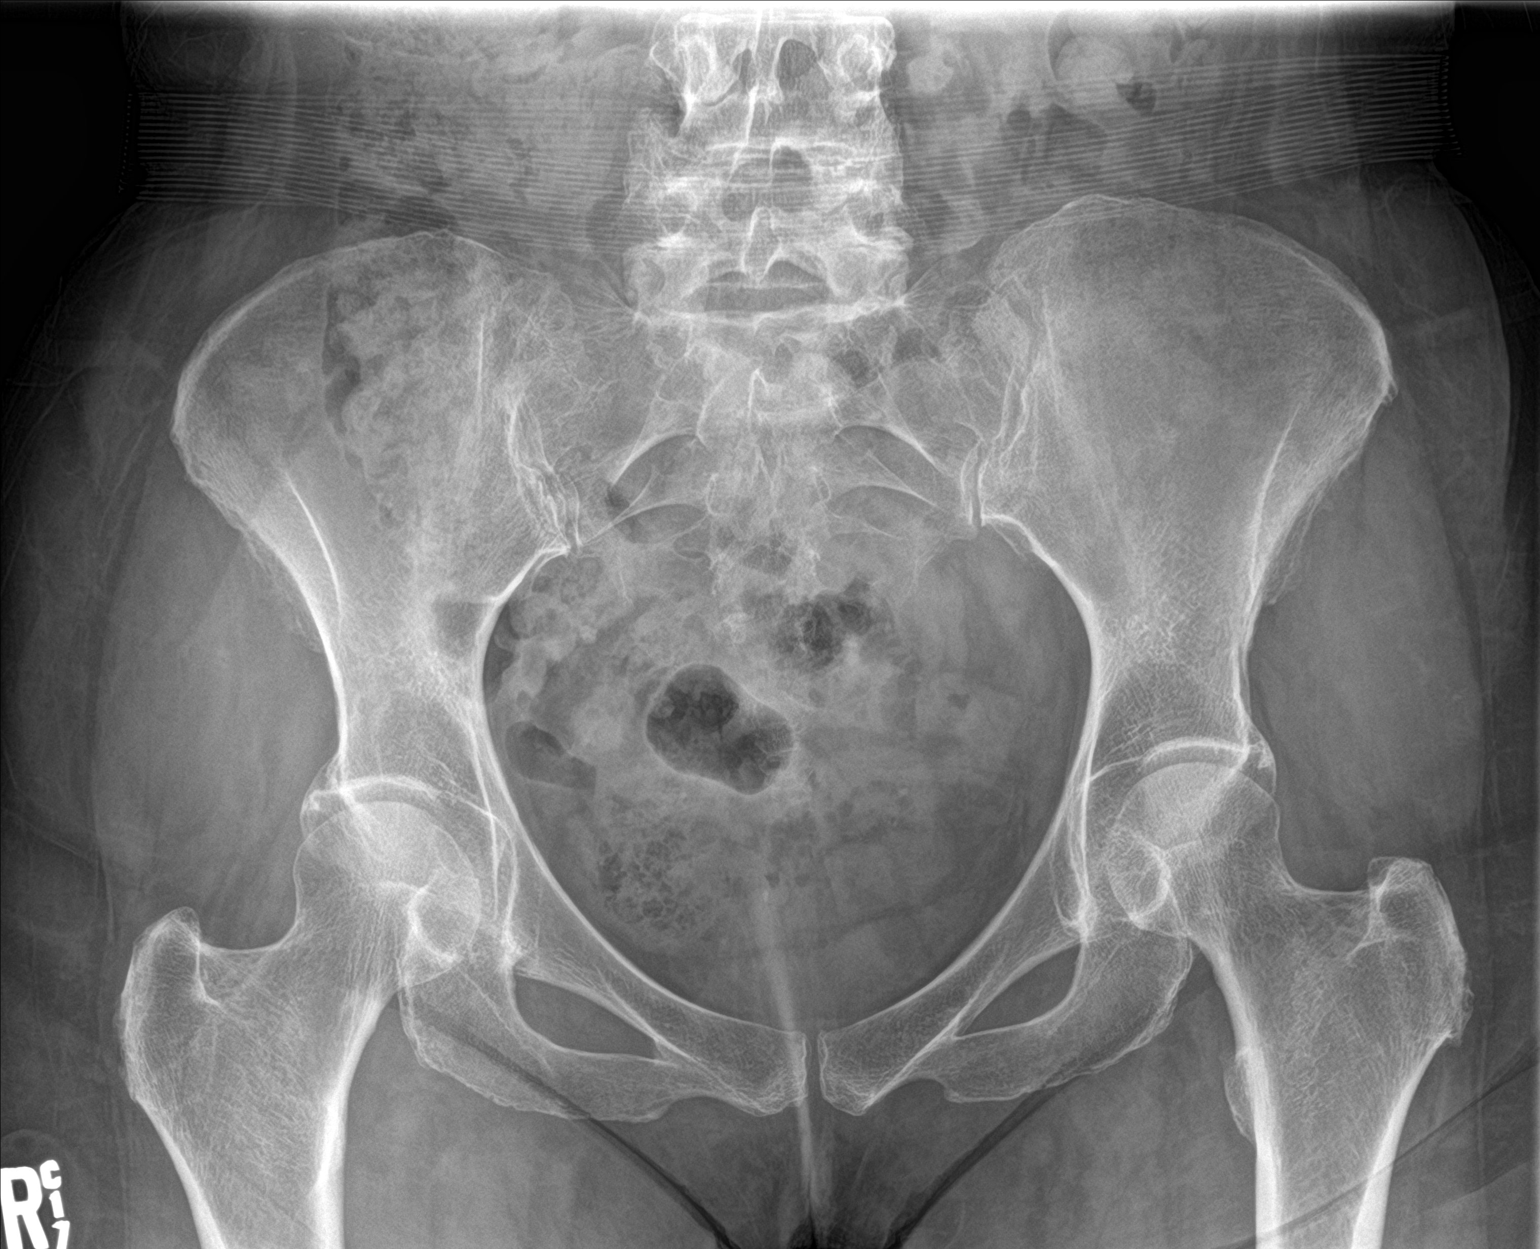
[im 2/3]
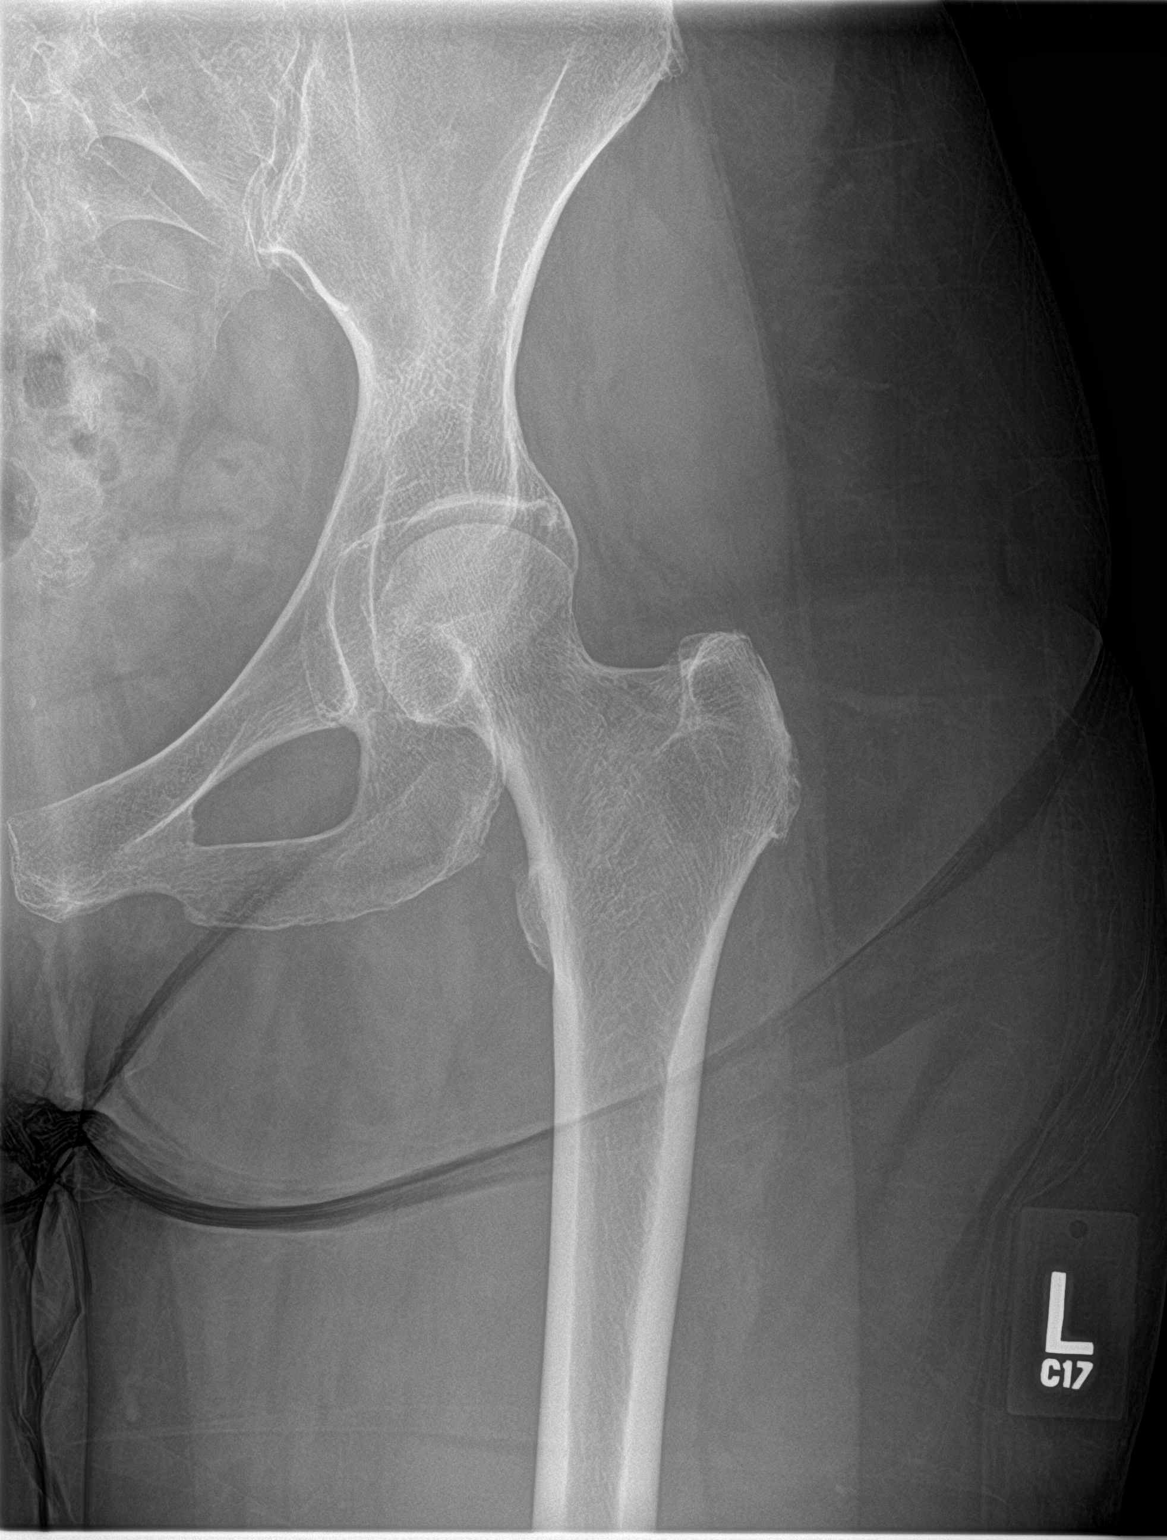
[im 3/3]
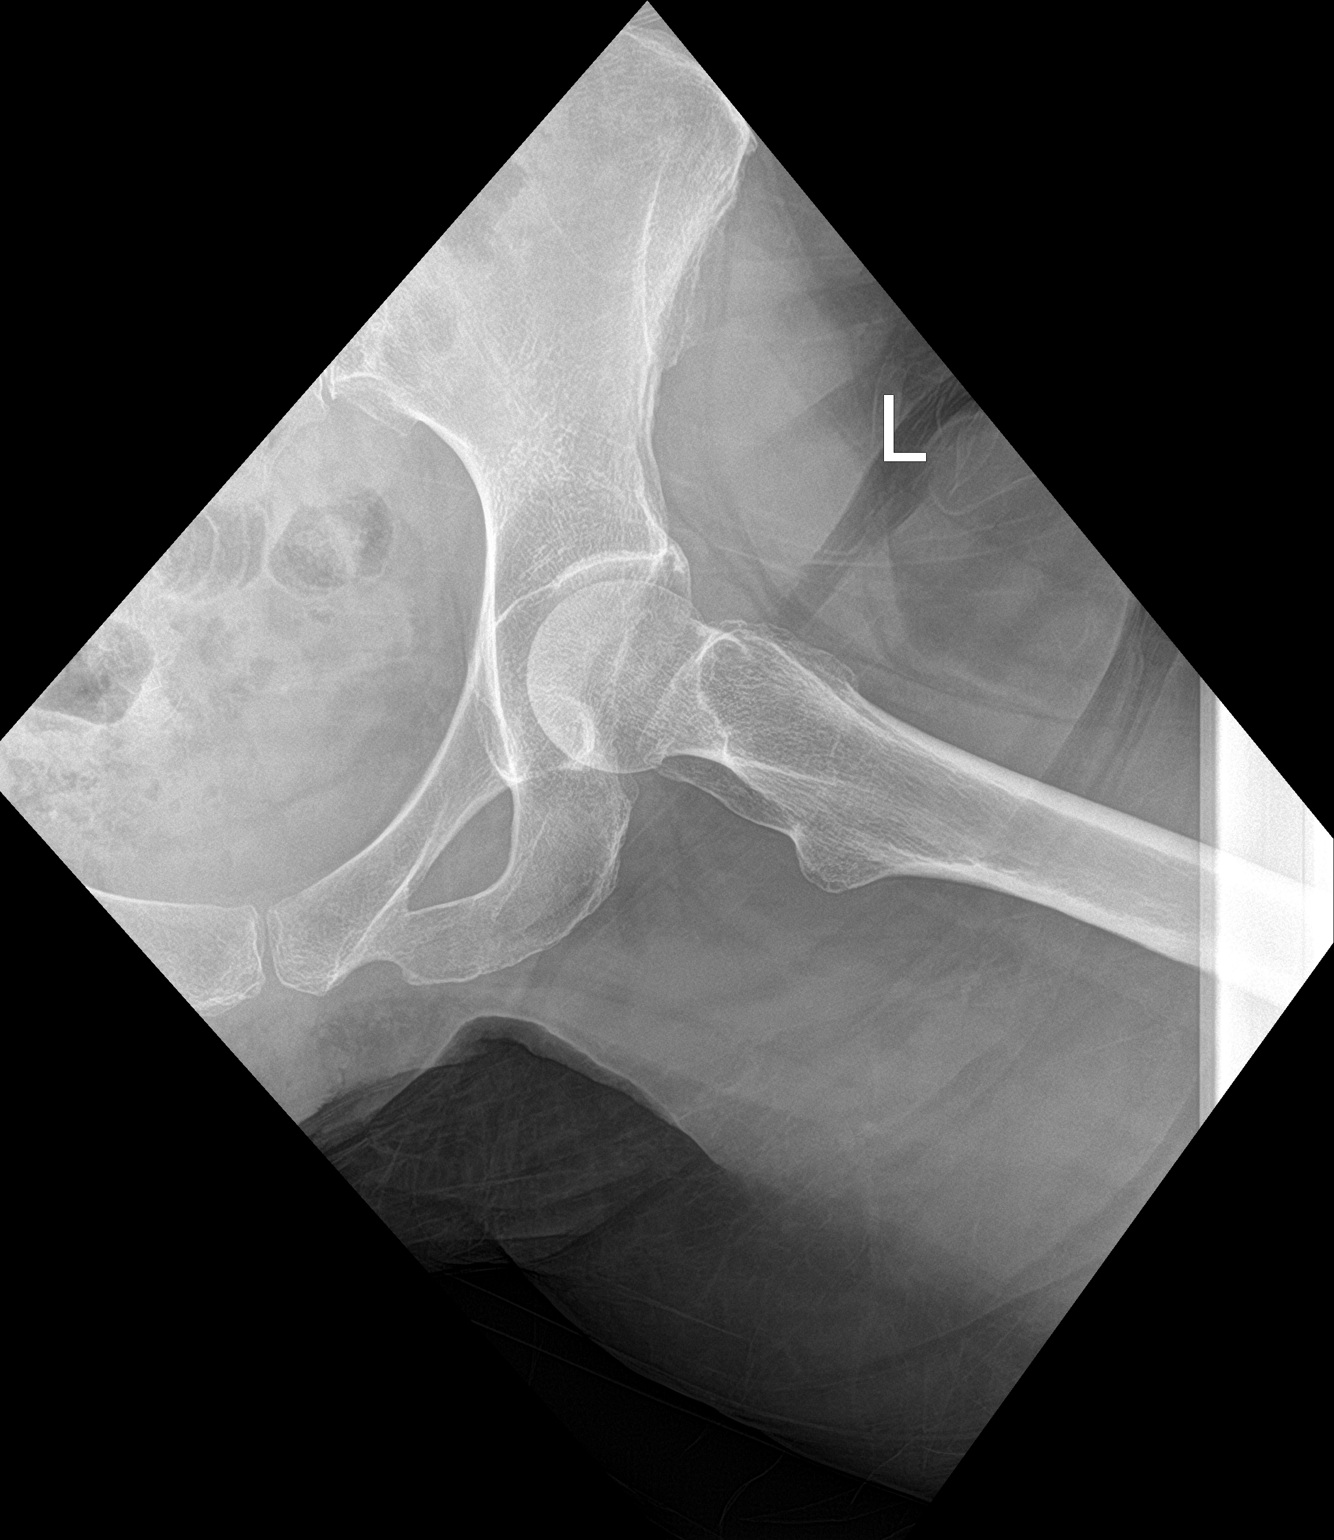

[3 of 3 positions shown; findings below may reference images not displayed]

FINDINGS: Hip joint space is maintained bilaterally. No degenerative changes
in the hips. There are degenerative changes incidentally imaged in
the lower lumbar spine.
IMPRESSION: 1. Normal left hip.
2. Degenerative changes in the lumbar spine. Lumbar spine series
dictated separately.

## 2020-08-17 MED ORDER — TRIAMCINOLONE ACETONIDE 0.1 % EX CREA: TOPICAL_CREAM | CUTANEOUS | 1 refills | Status: DC

## 2020-08-17 NOTE — Progress Notes (Signed)
   Follow-Up Visit   Subjective  Meghan Mack is a 69 y.o. female who presents for the following: Growth (chest x several months, sore) and Itchy spots (back).   The following portions of the chart were reviewed this encounter and updated as appropriate:      Review of Systems:  No other skin or systemic complaints except as noted in HPI or Assessment and Plan.  Objective  Well appearing patient in no apparent distress; mood and affect are within normal limits.  A focused examination was performed including back, chest. Relevant physical exam findings are noted in the Assessment and Plan.  Objective  Back, Left Lower Cheek x 1: Erythematous keratotic or waxy stuck-on papules.   Objective  Chest - Medial [facility]): Keratotic papule.   Assessment & Plan   Seborrheic Keratoses - Stuck-on, waxy, flesh papules and plaques  - Discussed benign etiology and prognosis. - Observe - Call for any changes  Inflamed seborrheic keratosis (2) Back; Left Lower Cheek x 1  Multiple lesions, since itching not isolated to one spot on back, will treat topically first, and then if there still is itching, we can freeze those spots later.  Cryotherapy to L cheek today.  Start TMC 0.1% Cream Apply qd/bid Aas back prn itch dsp 80g 1Rf. Avoid face, groin, axilla.  Samples of Eucerin Cream, Aveeno Lotion, Dove Body wash.  Topical steroids (such as triamcinolone, fluocinolone, fluocinonide, mometasone, clobetasol, halobetasol, betamethasone, hydrocortisone) can cause thinning and lightening of the skin if they are used for too long in the same area. Your physician has selected the right strength medicine for your problem and area affected on the body. Please use your medication only as directed by your physician to prevent side effects.    Destruction of lesion - Left Lower Cheek x 1  Destruction method: cryotherapy   Informed consent: discussed and consent obtained   Lesion destroyed using  liquid nitrogen: Yes   Region frozen until ice ball extended beyond lesion: Yes   Outcome: patient tolerated procedure well with no complications   Post-procedure details: wound care instructions given    triamcinolone cream (KENALOG) 0.1 % - Back  AK (actinic keratosis) Chest - Medial (Center)  Recheck on follow-up.  Destruction of lesion - Chest - Medial (Center)  Destruction method: cryotherapy   Informed consent: discussed and consent obtained   Lesion destroyed using liquid nitrogen: Yes   Region frozen until ice ball extended beyond lesion: Yes   Outcome: patient tolerated procedure well with no complications   Post-procedure details: wound care instructions given    Actinic Damage- chest - chronic, secondary to cumulative UV radiation exposure/sun exposure over time - diffuse scaly erythematous macules with underlying dyspigmentation - Recommend daily broad spectrum sunscreen SPF 30+ to sun-exposed areas, reapply every 2 hours as needed.  - Call for new or changing lesions.  Return as scheduled.   IJamesetta Orleans, CMA, am acting as scribe for Brendolyn Patty, MD .  Documentation: I have reviewed the above documentation for accuracy and completeness, and I agree with the above.  Brendolyn Patty MD

## 2020-08-17 NOTE — Patient Instructions (Addendum)
Seborrheic Keratosis  What causes seborrheic keratoses? Seborrheic keratoses are harmless, common skin growths that first appear during adult life.  As time goes by, more growths appear.  Some people may develop a large number of them.  Seborrheic keratoses appear on both covered and uncovered body parts.  They are not caused by sunlight.  The tendency to develop seborrheic keratoses can be inherited.  They vary in color from skin-colored to gray, brown, or even black.  They can be either smooth or have a rough, warty surface.   Seborrheic keratoses are superficial and look as if they were stuck on the skin.  Under the microscope this type of keratosis looks like layers upon layers of skin.  That is why at times the top layer may seem to fall off, but the rest of the growth remains and re-grows.    Treatment Seborrheic keratoses do not need to be treated, but can easily be removed in the office.  Seborrheic keratoses often cause symptoms when they rub on clothing or jewelry.  Lesions can be in the way of shaving.  If they become inflamed, they can cause itching, soreness, or burning.  Removal of a seborrheic keratosis can be accomplished by freezing, burning, or surgery. If any spot bleeds, scabs, or grows rapidly, please return to have it checked, as these can be an indication of a skin cancer.   Cryotherapy Aftercare  . Wash gently with soap and water everyday.   Marland Kitchen Apply Vaseline and Band-Aid daily until healed.  Topical steroids (such as triamcinolone, fluocinolone, fluocinonide, mometasone, clobetasol, halobetasol, betamethasone, hydrocortisone) can cause thinning and lightening of the skin if they are used for too long in the same area. Your physician has selected the right strength medicine for your problem and area affected on the body. Please use your medication only as directed by your physician to prevent side effects.

## 2020-08-25 ENCOUNTER — Other Ambulatory Visit: Payer: Medicare Other

## 2020-08-26 ENCOUNTER — Other Ambulatory Visit: Payer: Self-pay

## 2020-08-26 ENCOUNTER — Ambulatory Visit
Admission: RE | Admit: 2020-08-26 | Discharge: 2020-08-26 | Disposition: A | Discharge status: Home / Self Care | Payer: Medicare Other | Source: Ambulatory Visit | Attending: Adult Health Nurse Practitioner | Admitting: Adult Health Nurse Practitioner

## 2020-08-26 DIAGNOSIS — J384 Edema of larynx: Secondary | ICD-10-CM

## 2020-08-26 MED ORDER — IOPAMIDOL (ISOVUE-300) INJECTION 61%
75.0000 mL | Freq: Once | INTRAVENOUS | Status: AC | PRN
  Administered 2020-08-26: 75 mL via INTRAVENOUS

## 2020-09-07 ENCOUNTER — Other Ambulatory Visit: Payer: Self-pay | Admitting: Cardiovascular Disease

## 2020-09-16 ENCOUNTER — Telehealth: Payer: Self-pay | Admitting: Cardiovascular Disease

## 2020-09-16 NOTE — Telephone Encounter (Signed)
Patient wants to have lipid drawn at [facility].    Per patient request please mail order to home address.   Confirmed home address.

## 2020-09-16 NOTE — Telephone Encounter (Signed)
Pending Lipid order was printed and mailed to the patient.

## 2020-09-20 DIAGNOSIS — H40003 Preglaucoma, unspecified, bilateral: Secondary | ICD-10-CM | POA: Diagnosis not present

## 2020-09-21 ENCOUNTER — Telehealth: Payer: Medicare Other | Admitting: Family

## 2020-09-27 DIAGNOSIS — H40003 Preglaucoma, unspecified, bilateral: Secondary | ICD-10-CM | POA: Diagnosis not present

## 2020-10-04 DIAGNOSIS — E782 Mixed hyperlipidemia: Secondary | ICD-10-CM | POA: Diagnosis not present

## 2020-10-12 ENCOUNTER — Other Ambulatory Visit: Payer: Self-pay

## 2020-10-12 ENCOUNTER — Ambulatory Visit
Admission: RE | Admit: 2020-10-12 | Discharge: 2020-10-12 | Disposition: A | Discharge status: Home / Self Care | Payer: Medicare Other | Source: Ambulatory Visit | Attending: Family | Admitting: Family

## 2020-10-12 DIAGNOSIS — Z1231 Encounter for screening mammogram for malignant neoplasm of breast: Secondary | ICD-10-CM | POA: Diagnosis not present

## 2020-10-21 ENCOUNTER — Telehealth: Payer: Self-pay | Admitting: Family

## 2020-10-21 NOTE — Telephone Encounter (Signed)
Noted Evaluated by Anderson Malta burns :  Patient does not need any additional follow-up in our lung nodule program.  She does not meet criteria given she quit smoking greater than 15 years ago.

## 2020-10-21 NOTE — Telephone Encounter (Signed)
"  Rejection Reason - Patient Declined - Pt states her CT looks fine per PCP and does not need to see pulmonary at this time. Closing referral.//TM" [facility] said on Oct 21, 2020 8:13 AM

## 2020-10-24 ENCOUNTER — Other Ambulatory Visit: Payer: Self-pay

## 2020-10-24 ENCOUNTER — Ambulatory Visit: Payer: Medicare Other | Admitting: Dermatology

## 2020-10-24 DIAGNOSIS — D1800 Hemangioma unspecified site: Secondary | ICD-10-CM | POA: Diagnosis not present

## 2020-10-24 DIAGNOSIS — L820 Inflamed seborrheic keratosis: Secondary | ICD-10-CM

## 2020-10-24 DIAGNOSIS — R238 Other skin changes: Secondary | ICD-10-CM | POA: Diagnosis not present

## 2020-10-24 DIAGNOSIS — Z872 Personal history of diseases of the skin and subcutaneous tissue: Secondary | ICD-10-CM

## 2020-10-24 DIAGNOSIS — L578 Other skin changes due to chronic exposure to nonionizing radiation: Secondary | ICD-10-CM | POA: Diagnosis not present

## 2020-10-24 DIAGNOSIS — Z1283 Encounter for screening for malignant neoplasm of skin: Secondary | ICD-10-CM | POA: Diagnosis not present

## 2020-10-24 DIAGNOSIS — L814 Other melanin hyperpigmentation: Secondary | ICD-10-CM

## 2020-10-24 DIAGNOSIS — L821 Other seborrheic keratosis: Secondary | ICD-10-CM | POA: Diagnosis not present

## 2020-10-24 DIAGNOSIS — I781 Nevus, non-neoplastic: Secondary | ICD-10-CM | POA: Diagnosis not present

## 2020-10-24 NOTE — Progress Notes (Signed)
   Follow-Up Visit   Subjective  Meghan Mack is a 70 y.o. female who presents for the following: Annual Exam (No history of skin cancer. She has a red spot on her right thigh that gets itchy. She uses [facility] 0.1% cream and spot goes away, but comes back. She also has a history of AK of the chest to recheck.).   The following portions of the chart were reviewed this encounter and updated as appropriate:       Review of Systems:  No other skin or systemic complaints except as noted in HPI or Assessment and Plan.  Objective  Well appearing patient in no apparent distress; mood and affect are within normal limits.  A full examination was performed including scalp, head, eyes, ears, nose, lips, neck, chest, axillae, abdomen, back, buttocks, bilateral upper extremities, bilateral lower extremities, hands, feet, fingers, toes, fingernails, and toenails. All findings within normal limits unless otherwise noted below.  Objective  L upper back at braline x 1, R ant upper thigh x 1: Erythematous keratotic or waxy stuck-on papule   Objective  Left Inferior Knee: 8.36mm waxy tan macule  Objective  Right Lower Lip: Blue papule.  Objective  Central chest: Clear today.   Assessment & Plan  Inflamed seborrheic keratosis L upper back at braline x 1, R ant upper thigh x 1  Destruction of lesion - L upper back at braline x 1, R ant upper thigh x 1  Destruction method: cryotherapy   Informed consent: discussed and consent obtained   Lesion destroyed using liquid nitrogen: Yes   Region frozen until ice ball extended beyond lesion: Yes   Outcome: patient tolerated procedure well with no complications   Post-procedure details: wound care instructions given    Other Related Medications triamcinolone cream (KENALOG) 0.1 %  Seborrheic keratosis Left Inferior Knee  Benign, observe.    Venous lake Right Lower Lip  Benign, observe.    History of actinic keratosis Central  chest  Clear.  Skin cancer screening performed today.  Actinic Damage - chronic, secondary to cumulative UV radiation exposure/sun exposure over time - diffuse scaly erythematous macules with underlying dyspigmentation - Recommend daily broad spectrum sunscreen SPF 30+ to sun-exposed areas, reapply every 2 hours as needed.  - Call for new or changing lesions.  Lentigines - Scattered tan macules - Discussed due to sun exposure - Benign, observe - Recommend daily broad spectrum sunscreen SPF 30+ to sun-exposed areas, reapply every 2 hours as needed. - Call for any changes  Seborrheic Keratoses - Stuck-on, waxy, tan-brown papules and plaques  - Discussed benign etiology and prognosis. - Observe - Call for any changes  Hemangiomas - Red papules - Discussed benign nature - Observe - Call for any changes  Telangiectasia - Dilated blood vessel - Benign appearing on exam - Call for changes   Return in about 1 year (around 10/24/2021) for TBSE.   IJamesetta Orleans, CMA, am acting as scribe for Brendolyn Patty, MD .  Documentation: I have reviewed the above documentation for accuracy and completeness, and I agree with the above.  Brendolyn Patty MD

## 2020-10-24 NOTE — Patient Instructions (Addendum)

## 2020-11-15 DIAGNOSIS — H8111 Benign paroxysmal vertigo, right ear: Secondary | ICD-10-CM | POA: Diagnosis not present

## 2020-11-21 DIAGNOSIS — E559 Vitamin D deficiency, unspecified: Secondary | ICD-10-CM | POA: Diagnosis not present

## 2020-11-21 DIAGNOSIS — E890 Postprocedural hypothyroidism: Secondary | ICD-10-CM | POA: Diagnosis not present

## 2020-11-21 DIAGNOSIS — H8111 Benign paroxysmal vertigo, right ear: Secondary | ICD-10-CM | POA: Diagnosis not present

## 2020-11-22 DIAGNOSIS — H40003 Preglaucoma, unspecified, bilateral: Secondary | ICD-10-CM | POA: Diagnosis not present

## 2020-11-28 DIAGNOSIS — H8111 Benign paroxysmal vertigo, right ear: Secondary | ICD-10-CM | POA: Diagnosis not present

## 2020-11-30 DIAGNOSIS — E890 Postprocedural hypothyroidism: Secondary | ICD-10-CM | POA: Diagnosis not present

## 2020-11-30 DIAGNOSIS — M8580 Other specified disorders of bone density and structure, unspecified site: Secondary | ICD-10-CM | POA: Diagnosis not present

## 2020-11-30 DIAGNOSIS — E559 Vitamin D deficiency, unspecified: Secondary | ICD-10-CM | POA: Diagnosis not present

## 2020-12-06 DIAGNOSIS — H8111 Benign paroxysmal vertigo, right ear: Secondary | ICD-10-CM | POA: Diagnosis not present

## 2020-12-23 DIAGNOSIS — M3500 Sicca syndrome, unspecified: Secondary | ICD-10-CM | POA: Diagnosis not present

## 2020-12-23 DIAGNOSIS — H8111 Benign paroxysmal vertigo, right ear: Secondary | ICD-10-CM | POA: Diagnosis not present

## 2021-01-20 NOTE — Progress Notes (Signed)
NEUROLOGY FOLLOW UP OFFICE NOTE  Meghan Mack [id number]  Assessment/Plan:   1.  Primary stabbing headache 2.  New onset headache - these are new and should be further evaluated 2.  Visual phenomena - Palinopsia vs migraine aura 3.  Cerebral meningioma 4.  Intracranial stenosis  1.  Check MRI of brain with and without contrast 2.  If headaches do not abate, she will contact me and we can start gabapentin 3.  Limit use of pain relievers to no more than 2 days out of week to prevent risk of rebound or medication-overuse headache. 4.  Keep headache diary 5.  Follow up 4 to 6 months.  Subjective:  Meghan Mack is a 70 year old right-handed female withCOPD, IBS, anxiety, Sjogren's, hypothyroidism and hyperlipidemiaand meningiomawho follows up primary stabbing headache.  UPDATE: Stabbing headaches stable - moderate to severe but paroxysmal throughout the day, averaging 2 to 4 days a month.    Last week had new headache, a sqeezing sensation cold water left top off and on daily for a week.  Takes Advil.  Still has consistent visual phenomena - Flashing circles when she is exposed to sunlight or jumping lines when looking at checkered patterns.    Current NSAIDS:ASA 81mg ; ibuprofen Current Antihypertensive medications:Bystolic Current Vitamins/Herbal/Supplements:D Other medications:Simvastatin 20mg , Synthroid  HISTORY: She has had headaches off and on since around age 39. It is a moderate to severe paroxysmal sharp pain, like a lightening bolt, brief and occurs in various areas over her head. It can occur once or several times. It came back in August. They occurred off and on for 3 days in a row. It seems to occur during rainy weather. Since then, she has had 1 or 2 spells also during rainy weather. Separately, she has episodes of visual disturbance, in which she sees a circle surrounded by bright light in her right eye when she walks out into a bright  environment (like walking outside in the sun. It typically lasts a few seconds and occurs infrequently. Also, if she looks at a checkered pattern, the lines start jumping.   No prior history of headaches.  MRI of brain with and without contrast on 06/05/19 showed stable 6 x 9 mm meningioma along the anterior tentorium. MRA of head showed intracranial atheroclerosis with stenosis involving the right P2 segment and left ICA at skull base. Carotid doppler from 06/24/19 showed no hemodynamically significant ICA stenosis and demonstrated antegrade flow in both vertebral arteries.  PAST MEDICAL HISTORY: Past Medical History:  Diagnosis Date  . Anemia   . Colonic polyp   . Constipation   . COPD (chronic obstructive pulmonary disease) ([place])   . Hyperlipidemia   . IBS (irritable bowel syndrome)   . Kidney cysts   . Lung nodule   . Meningioma ([place])   . Nausea   . Palpitations   . Tonsillar cyst     MEDICATIONS: Current Outpatient Medications on File Prior to Visit  Medication Sig Dispense Refill  . ergocalciferol (VITAMIN D2) 50000 UNITS capsule Take 50,000 Units by mouth every 30 (thirty) days.    Marland Kitchen ibuprofen (ADVIL,MOTRIN) 600 MG tablet Take 1 tablet (600 mg total) by mouth every 8 (eight) hours as needed. 20 tablet 0  . latanoprost (XALATAN) 0.005 % ophthalmic solution SMARTSIG:In Eye(s)    . levothyroxine (SYNTHROID, LEVOTHROID) 88 MCG tablet Take 1 tablet 6 days a week.    . Multiple Vitamin (MULTIVITAMIN PO) Take 1 tablet by mouth daily.    Marland Kitchen  nebivolol (BYSTOLIC) 10 MG tablet Take 0.5 tablets (5 mg total) by mouth 2 (two) times daily. 90 tablet 2  . simvastatin (ZOCOR) 20 MG tablet TAKE 1 TABLET (20 MG) EVERY EVENING (IMPROVES CHOLESTEROL) 90 tablet 1  . triamcinolone cream (KENALOG) 0.1 % Apply to affected areas on back/body 1-2 times a day as needed for itch. Avoid face, groin, underarms. 80 g 1   No current facility-administered medications on file prior to visit.     ALLERGIES: Allergies  Allergen Reactions  . Diflucan [Fluconazole] Hives  . Tdap [Tetanus-Diphth-Acell Pertussis] Swelling and Rash  . Codeine   . Propantheline Bromide   . Sulfa Antibiotics Other (See Comments)  . Sulfonamide Derivatives   . Esomeprazole Magnesium Nausea Only  . Gadolinium Derivatives Palpitations    gabastat used 05/2019 for MRI/MRA made heart race.   . Meperidine Hcl Palpitations    Causes heart to race  . Pneumovax 23 [Pneumococcal Vac Polyvalent] Swelling    Extensive local reaction with arm swelling and induration  . Propoxyphene N-Acetaminophen Palpitations    FAMILY HISTORY: Family History  Problem Relation Age of Onset  . Cancer Mother        colon  . Emphysema Father   . Heart disease Father   . Cancer Father        leukemia  . Cancer Brother        lung  . Cancer Maternal Aunt        melanoma  . Cancer Maternal Grandfather        stomach, colon  . Breast cancer Neg Hx   . Kidney cancer Neg Hx   . Kidney disease Neg Hx   . Prostate cancer Neg Hx       Objective:  Blood pressure 136/85, pulse 67, resp. rate 18, height 5' 7.5" (1.715 m), weight 154 lb (69.9 kg), SpO2 95 %. General: No acute distress.  Patient appears well-groomed.   Head:  Normocephalic/atraumatic Eyes:  Fundi examined but not visualized Neck: supple, no paraspinal tenderness, full range of motion Heart:  Regular rate and rhythm Lungs:  Clear to auscultation bilaterally Back: No paraspinal tenderness Neurological Exam: alert and oriented to person, place, and time. Attention span and concentration intact, recent and remote memory intact, fund of knowledge intact.  Speech fluent and not dysarthric, language intact.  CN II-XII intact. Bulk and tone normal, muscle strength 5/5 throughout.  Sensation to light touch, temperature and vibration intact.  Deep tendon reflexes 2+ throughout, toes downgoing.  Finger to nose and heel to shin testing intact.  Gait normal, Romberg  negative.     Metta Clines, DO  CC: Burnard Hawthorne, FNP

## 2021-01-23 ENCOUNTER — Encounter: Payer: Self-pay | Admitting: Neurology

## 2021-01-23 ENCOUNTER — Other Ambulatory Visit: Payer: Self-pay

## 2021-01-23 ENCOUNTER — Ambulatory Visit: Payer: Medicare Other | Admitting: Neurology

## 2021-01-23 VITALS — BP 136/85 | HR 67 | Resp 18 | Ht 67.5 in | Wt 154.0 lb

## 2021-01-23 DIAGNOSIS — H538 Other visual disturbances: Secondary | ICD-10-CM

## 2021-01-23 DIAGNOSIS — G4485 Primary stabbing headache: Secondary | ICD-10-CM

## 2021-01-23 DIAGNOSIS — R519 Headache, unspecified: Secondary | ICD-10-CM | POA: Diagnosis not present

## 2021-01-23 DIAGNOSIS — D329 Benign neoplasm of meninges, unspecified: Secondary | ICD-10-CM | POA: Diagnosis not present

## 2021-01-23 DIAGNOSIS — I679 Cerebrovascular disease, unspecified: Secondary | ICD-10-CM

## 2021-01-23 NOTE — Patient Instructions (Signed)
1.  Will check MRI brain with and without contrast 2.  Limit use of pain relievers to no more than 2 days out of week to prevent risk of rebound or medication-overuse headache. 3.  If headaches do not improve, contact me and we can start gabapentin 4.  Otherwise follow up 4-6 months.

## 2021-02-12 ENCOUNTER — Ambulatory Visit
Admission: RE | Admit: 2021-02-12 | Discharge: 2021-02-12 | Disposition: A | Discharge status: Home / Self Care | Payer: Medicare Other | Source: Ambulatory Visit | Attending: Neurology | Admitting: Neurology

## 2021-02-12 DIAGNOSIS — D329 Benign neoplasm of meninges, unspecified: Secondary | ICD-10-CM | POA: Diagnosis not present

## 2021-02-12 DIAGNOSIS — R519 Headache, unspecified: Secondary | ICD-10-CM | POA: Diagnosis not present

## 2021-02-13 NOTE — Progress Notes (Signed)
LMOVM to call the office back.

## 2021-02-13 NOTE — Progress Notes (Signed)
Pt advised of her MRI results.

## 2021-02-14 ENCOUNTER — Telehealth (INDEPENDENT_AMBULATORY_CARE_PROVIDER_SITE_OTHER): Payer: Medicare Other | Admitting: Family

## 2021-02-14 ENCOUNTER — Encounter: Payer: Self-pay | Admitting: Family

## 2021-02-14 VITALS — BP 134/83 | Ht 67.5 in | Wt 151.0 lb

## 2021-02-14 DIAGNOSIS — R509 Fever, unspecified: Secondary | ICD-10-CM

## 2021-02-14 MED ORDER — DOXYCYCLINE HYCLATE 100 MG PO TABS: 100.0000 mg | ORAL_TABLET | Freq: Two times a day (BID) | ORAL | 0 refills | Status: AC

## 2021-02-14 NOTE — Progress Notes (Signed)
Virtual Visit via Video Note  I connected with@  on 02/15/21 at  2:00 PM EDT by a video enabled telemedicine application and verified that I am speaking with the correct person using two identifiers.  Location patient: home Location provider:work  Persons participating in the virtual visit: patient, provider  I discussed the limitations of evaluation and management by telemedicine and the availability of in person appointments. The patient expressed understanding and agreed to proceed.   HPI: Acute visit for febrile illness and tick bite  She removed a 'tiny deer tick' tick off left dorsal thigh, one week ago 2 days later, had fatigue, hips and leg pain which resolved 2 days later. Fever, max 101.Approx 4 days after tick bite which has since resolved Redness size of grapefruit left thigh.  No purulent discharge or red streaks.  Tick was attached however not sure how long it was there.  Tick was not engorged.  Cough and fever presented 3 days ago. covid tested with PCR yesterday negative.   No nasal congestion, sob, sore throat    ROS: See pertinent positives and negatives per HPI.    EXAM:  VITALS per patient if applicable: BP 716/96   Ht 5' 7.5" (1.715 m)   Wt 151 lb (68.5 kg)   BMI 23.30 kg/m  BP Readings from Last 3 Encounters:  02/14/21 134/83  01/23/21 136/85  07/11/20 134/90   Wt Readings from Last 3 Encounters:  02/14/21 151 lb (68.5 kg)  01/23/21 154 lb (69.9 kg)  07/11/20 150 lb 6 oz (68.2 kg)    GENERAL: alert, oriented, appears well and in no acute distress  HEENT: atraumatic, conjunttiva clear, no obvious abnormalities on inspection of external nose and ears  NECK: normal movements of the head and neck  LUNGS: on inspection no signs of respiratory distress, breathing rate appears normal, no obvious gross SOB, gasping or wheezing  CV: no obvious cyanosis  MS: moves all visible extremities without noticeable abnormality  SKIN: left medial thigh  pinpoint raised lesion with appearance white exudate. Surrounding erythema which appears to be 4cm in in circular diameter. No obvious clearing in middle.   PSYCH/NEURO: pleasant and cooperative, no obvious depression or anxiety, speech and thought processing grossly intact  ASSESSMENT AND PLAN:  Discussed the following assessment and plan:  Problem List Items Addressed This Visit      Other   Fever - Primary    Afebrile. Concern for early cellulitis versus erythema migrans as difficult to discern over virtual. PCR covid negative so agreed likely non covid viral illness which is likely self limited. Tick attached however length of time unknown. No HA, arthralgia. Agreed reasonable to treat for cellulitis which will also cover for lyme diease. start 10 day course of doxycyline.Counseled regarding importance of probiotics and wearing sun protection. She will draw circle around rash and call if extends larger than observed today. Lyme titers in 6 weeks time. Patient will let me know of any new concerns.       Relevant Medications   doxycycline (VIBRA-TABS) 100 MG tablet   Other Relevant Orders   Lyme Disease, IgM, Early Test w/ Rflx      -we discussed possible serious and likely etiologies, options for evaluation and workup, limitations of telemedicine visit vs in person visit, treatment, treatment risks and precautions. Pt prefers to treat via telemedicine empirically rather then risking or undertaking an in person visit at this moment.  .   I discussed the assessment and treatment plan  with the patient. The patient was provided an opportunity to ask questions and all were answered. The patient agreed with the plan and demonstrated an understanding of the instructions.   The patient was advised to call back or seek an in-person evaluation if the symptoms worsen or if the condition fails to improve as anticipated.   Mable Paris, FNP

## 2021-02-15 NOTE — Assessment & Plan Note (Signed)
Afebrile. Concern for early cellulitis versus erythema migrans as difficult to discern over virtual. PCR covid negative so agreed likely non covid viral illness which is likely self limited. Tick attached however length of time unknown. No HA, arthralgia. Agreed reasonable to treat for cellulitis which will also cover for lyme diease. start 10 day course of doxycyline.Counseled regarding importance of probiotics and wearing sun protection. She will draw circle around rash and call if extends larger than observed today. Lyme titers in 6 weeks time. Patient will let me know of any new concerns.

## 2021-02-20 ENCOUNTER — Other Ambulatory Visit: Payer: Self-pay

## 2021-02-20 MED ORDER — SIMVASTATIN 20 MG PO TABS: ORAL_TABLET | ORAL | 0 refills | Status: DC

## 2021-03-08 ENCOUNTER — Other Ambulatory Visit: Payer: Self-pay | Admitting: Cardiovascular Disease

## 2021-03-08 ENCOUNTER — Ambulatory Visit (INDEPENDENT_AMBULATORY_CARE_PROVIDER_SITE_OTHER): Payer: Medicare Other

## 2021-03-08 VITALS — Ht 67.5 in | Wt 151.0 lb

## 2021-03-08 DIAGNOSIS — Z0000 Encounter for general adult medical examination without abnormal findings: Secondary | ICD-10-CM

## 2021-03-08 NOTE — Patient Instructions (Addendum)
Meghan Mack , Thank you for taking time to come for your Medicare Wellness Visit. I appreciate your ongoing commitment to your health goals. Please review the following plan we discussed and let me know if I can assist you in the future.   These are the goals we discussed: Goals      Patient Stated   .  Weight (lb) < 145 lb (65.8 kg) (pt-stated)      I want to lose about 5lb-10lb Monitor carb intake       This is a list of the screening recommended for you and due dates:  Health Maintenance  Topic Date Due  . COVID-19 Vaccine (4 - Booster for Pfizer series) 06/24/2021*  . Flu Shot  05/15/2021  . Tetanus Vaccine  09/02/2021  . Mammogram  10/12/2022  . Colon Cancer Screening  07/20/2024  . DEXA scan (bone density measurement)  Completed  . Hepatitis C Screening: USPSTF Recommendation to screen - Ages 19-79 yo.  Completed  . HPV Vaccine  Aged Out  . Pneumonia vaccines  Discontinued  *Topic was postponed. The date shown is not the original due date.    Immunizations Immunization History  Administered Date(s) Administered  . Influenza Split 07/15/2011, 06/26/2014  . Influenza Whole 06/22/2009, 06/13/2010, 07/05/2012, 07/06/2018  . Influenza, High Dose Seasonal PF 07/06/2018, 07/16/2019  . Influenza,inj,quad, With Preservative 07/16/2019  . Influenza-Unspecified 06/15/2013, 06/19/2015, 06/29/2016, 07/14/2017, 07/16/2019, 07/11/2020  . PFIZER(Purple Top)SARS-COV-2 Vaccination 11/23/2019, 12/18/2019, 08/01/2020  . Pneumococcal Conjugate-13 06/27/2015  . Pneumococcal Polysaccharide-23 08/11/2007  . Td 07/17/2001  . Tdap 09/03/2011  . Zoster 06/13/2010   Advanced directives: End of life planning; Advance aging; Advanced directives discussed.  Copy of current HCPOA/Living Will requested.    Conditions/risks identified: none new.   Follow up in one year for your annual wellness visit.   Preventive Care 11 Years and Older, Female Preventive care refers to lifestyle choices  and visits with your health care provider that can promote health and wellness. What does preventive care include?  A yearly physical exam. This is also called an annual well check.  Dental exams once or twice a year.  Routine eye exams. Ask your health care provider how often you should have your eyes checked.  Personal lifestyle choices, including:  Daily care of your teeth and gums.  Regular physical activity.  Eating a healthy diet.  Avoiding tobacco and drug use.  Limiting alcohol use.  Practicing safe sex.  Taking low-dose aspirin every day.  Taking vitamin and mineral supplements as recommended by your health care provider. What happens during an annual well check? The services and screenings done by your health care provider during your annual well check will depend on your age, overall health, lifestyle risk factors, and family history of disease. Counseling  Your health care provider may ask you questions about your:  Alcohol use.  Tobacco use.  Drug use.  Emotional well-being.  Home and relationship well-being.  Sexual activity.  Eating habits.  History of falls.  Memory and ability to understand (cognition).  Work and work Statistician.  Reproductive health. Screening  You may have the following tests or measurements:  Height, weight, and BMI.  Blood pressure.  Lipid and cholesterol levels. These may be checked every 5 years, or more frequently if you are over 77 years old.  Skin check.  Lung cancer screening. You may have this screening every year starting at age 14 if you have a 30-pack-year history of smoking and currently smoke  or have quit within the past 15 years.  Fecal occult blood test (FOBT) of the stool. You may have this test every year starting at age 36.  Flexible sigmoidoscopy or colonoscopy. You may have a sigmoidoscopy every 5 years or a colonoscopy every 10 years starting at age 70.  Hepatitis C blood test.  Hepatitis  B blood test.  Sexually transmitted disease (STD) testing.  Diabetes screening. This is done by checking your blood sugar (glucose) after you have not eaten for a while (fasting). You may have this done every 1-3 years.  Bone density scan. This is done to screen for osteoporosis. You may have this done starting at age 97.  Mammogram. This may be done every 1-2 years. Talk to your health care provider about how often you should have regular mammograms. Talk with your health care provider about your test results, treatment options, and if necessary, the need for more tests. Vaccines  Your health care provider may recommend certain vaccines, such as:  Influenza vaccine. This is recommended every year.  Tetanus, diphtheria, and acellular pertussis (Tdap, Td) vaccine. You may need a Td booster every 10 years.  Zoster vaccine. You may need this after age 42.  Pneumococcal 13-valent conjugate (PCV13) vaccine. One dose is recommended after age 8.  Pneumococcal polysaccharide (PPSV23) vaccine. One dose is recommended after age 40. Talk to your health care provider about which screenings and vaccines you need and how often you need them. This information is not intended to replace advice given to you by your health care provider. Make sure you discuss any questions you have with your health care provider. Document Released: 10/28/2015 Document Revised: 06/20/2016 Document Reviewed: 08/02/2015 Elsevier Interactive Patient Education  2017 [place] Prevention in the Home Falls can cause injuries. They can happen to people of all ages. There are many things you can do to make your home safe and to help prevent falls. What can I do on the outside of my home?  Regularly fix the edges of walkways and driveways and fix any cracks.  Remove anything that might make you trip as you walk through a door, such as a raised step or threshold.  Trim any bushes or trees on the path to your  home.  Use bright outdoor lighting.  Clear any walking paths of anything that might make someone trip, such as rocks or tools.  Regularly check to see if handrails are loose or broken. Make sure that both sides of any steps have handrails.  Any raised decks and porches should have guardrails on the edges.  Have any leaves, snow, or ice cleared regularly.  Use sand or salt on walking paths during winter.  Clean up any spills in your garage right away. This includes oil or grease spills. What can I do in the bathroom?  Use night lights.  Install grab bars by the toilet and in the tub and shower. Do not use towel bars as grab bars.  Use non-skid mats or decals in the tub or shower.  If you need to sit down in the shower, use a plastic, non-slip stool.  Keep the floor dry. Clean up any water that spills on the floor as soon as it happens.  Remove soap buildup in the tub or shower regularly.  Attach bath mats securely with double-sided non-slip rug tape.  Do not have throw rugs and other things on the floor that can make you trip. What can I do in the  bedroom?  Use night lights.  Make sure that you have a light by your bed that is easy to reach.  Do not use any sheets or blankets that are too big for your bed. They should not hang down onto the floor.  Have a firm chair that has side arms. You can use this for support while you get dressed.  Do not have throw rugs and other things on the floor that can make you trip. What can I do in the kitchen?  Clean up any spills right away.  Avoid walking on wet floors.  Keep items that you use a lot in easy-to-reach places.  If you need to reach something above you, use a strong step stool that has a grab bar.  Keep electrical cords out of the way.  Do not use floor polish or wax that makes floors slippery. If you must use wax, use non-skid floor wax.  Do not have throw rugs and other things on the floor that can make you  trip. What can I do with my stairs?  Do not leave any items on the stairs.  Make sure that there are handrails on both sides of the stairs and use them. Fix handrails that are broken or loose. Make sure that handrails are as long as the stairways.  Check any carpeting to make sure that it is firmly attached to the stairs. Fix any carpet that is loose or worn.  Avoid having throw rugs at the top or bottom of the stairs. If you do have throw rugs, attach them to the floor with carpet tape.  Make sure that you have a light switch at the top of the stairs and the bottom of the stairs. If you do not have them, ask someone to add them for you. What else can I do to help prevent falls?  Wear shoes that:  Do not have high heels.  Have rubber bottoms.  Are comfortable and fit you well.  Are closed at the toe. Do not wear sandals.  If you use a stepladder:  Make sure that it is fully opened. Do not climb a closed stepladder.  Make sure that both sides of the stepladder are locked into place.  Ask someone to hold it for you, if possible.  Clearly mark and make sure that you can see:  Any grab bars or handrails.  First and last steps.  Where the edge of each step is.  Use tools that help you move around (mobility aids) if they are needed. These include:  Canes.  Walkers.  Scooters.  Crutches.  Turn on the lights when you go into a dark area. Replace any light bulbs as soon as they burn out.  Set up your furniture so you have a clear path. Avoid moving your furniture around.  If any of your floors are uneven, fix them.  If there are any pets around you, be aware of where they are.  Review your medicines with your doctor. Some medicines can make you feel dizzy. This can increase your chance of falling. Ask your doctor what other things that you can do to help prevent falls. This information is not intended to replace advice given to you by your health care provider. Make  sure you discuss any questions you have with your health care provider. Document Released: 07/28/2009 Document Revised: 03/08/2016 Document Reviewed: 11/05/2014 Elsevier Interactive Patient Education  2017 [employer].

## 2021-03-08 NOTE — Progress Notes (Addendum)
Subjective:   Meghan Mack is a 70 y.o. female who presents for Medicare Annual (Subsequent) preventive examination.  Review of Systems    No ROS.  Medicare Wellness Virtual Visit.  Visual/audio telehealth visit, UTA vital signs.   See social history for additional risk factors.   Cardiac Risk Factors include: advanced age (>32men, >42 women);hypertension     Objective:    Today's Vitals   03/08/21 0946  Weight: 151 lb (68.5 kg)  Height: 5' 7.5" (1.715 m)   Body mass index is 23.3 kg/m.  Advanced Directives 03/08/2021 01/23/2021 03/07/2020 12/29/2019 07/01/2019 06/02/2019 03/05/2019  Does Patient Have a Medical Advance Directive? Yes Yes Yes Yes No Yes Yes  Type of Paramedic of [place];Living will - [place];Living will - - Living will;Healthcare Power of [place];Living will  Does patient want to make changes to medical advance directive? No - Patient declined - No - Patient declined - - No - Patient declined No - Patient declined  Copy of [place] in Chart? No - copy requested - No - copy requested - - - No - copy requested  Would patient like information on creating a medical advance directive? - - - - - - -    Current Medications (verified) Outpatient Encounter Medications as of 03/08/2021  Medication Sig  . ergocalciferol (VITAMIN D2) 50000 UNITS capsule Take 50,000 Units by mouth every 30 (thirty) days.  Marland Kitchen ibuprofen (ADVIL,MOTRIN) 600 MG tablet Take 1 tablet (600 mg total) by mouth every 8 (eight) hours as needed.  . latanoprost (XALATAN) 0.005 % ophthalmic solution SMARTSIG:In Eye(s)  . levothyroxine (SYNTHROID, LEVOTHROID) 88 MCG tablet Take 1 tablet 6 days a week.  . Multiple Vitamin (MULTIVITAMIN PO) Take 1 tablet by mouth daily.  . nebivolol (BYSTOLIC) 10 MG tablet Take 0.5 tablets (5 mg total) by mouth 2 (two) times daily.  . simvastatin (ZOCOR) 20 MG tablet Take 1  tablet by mouth every evening  . triamcinolone cream (KENALOG) 0.1 % Apply to affected areas on back/body 1-2 times a day as needed for itch. Avoid face, groin, underarms.   No facility-administered encounter medications on file as of 03/08/2021.    Allergies (verified) Diflucan [fluconazole], Tdap [tetanus-diphth-acell pertussis], Codeine, Propantheline bromide, Sulfa antibiotics, Sulfonamide derivatives, Esomeprazole magnesium, Gadolinium derivatives, Meperidine hcl, Pneumovax 23 [pneumococcal vac polyvalent], and Propoxyphene n-acetaminophen   History: Past Medical History:  Diagnosis Date  . Anemia   . Colonic polyp   . Constipation   . COPD (chronic obstructive pulmonary disease) ([place])   . Hyperlipidemia   . IBS (irritable bowel syndrome)   . Kidney cysts   . Lung nodule   . Meningioma ([place])   . Nausea   . Palpitations   . Tonsillar cyst    Past Surgical History:  Procedure Laterality Date  . CARDIAC CATHETERIZATION  02/25/2007  . COLONOSCOPY    . ESOPHAGOGASTRODUODENOSCOPY  10/1997 & 08/2005  . FOOT SURGERY  04/25/2007   right  . THYROIDECTOMY  11/2005  . VAGINAL HYSTERECTOMY     ovaries intact   Family History  Problem Relation Age of Onset  . Cancer Mother        colon  . Emphysema Father   . Heart disease Father   . Cancer Father        leukemia  . Cancer Brother        lung  . Cancer Maternal Aunt  melanoma  . Cancer Maternal Grandfather        stomach, colon  . Breast cancer Neg Hx   . Kidney cancer Neg Hx   . Kidney disease Neg Hx   . Prostate cancer Neg Hx    Social History   Socioeconomic History  . Marital status: Married    Spouse name: Not on file  . Number of children: 2  . Years of education: Not on file  . Highest education level: Not on file  Occupational History  . Occupation: Metallurgist: [employer]  Tobacco Use  . Smoking status: Former Smoker    Packs/day: 0.25    Years: 10.00    Pack years: 2.50     Types: Cigarettes    Quit date: 09/25/1997    Years since quitting: 23.4  . Smokeless tobacco: Never Used  Vaping Use  . Vaping Use: Never used  Substance and Sexual Activity  . Alcohol use: No  . Drug use: No  . Sexual activity: Yes    Partners: Male    Birth control/protection: Surgical  Other Topics Concern  . Not on file  Social History Narrative   Lives with husband in [place], has 2 children and 2 step-children. No pets.      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       Diet - regular   Exercise - walks 5 days per week   1.5 years of college      Caffeine - 2-3 glasses iced tea day; coffee 1 cup 1/2 decaf and 1/2 reg   Right handed. One level home.    Social Determinants of Health   Financial Resource Strain: Low Risk   . Difficulty of Paying Living Expenses: Not hard at all  Food Insecurity: No Food Insecurity  . Worried About Charity fundraiser in the Last Year: Never true  . Ran Out of Food in the Last Year: Never true  Transportation Needs: No Transportation Needs  . Lack of Transportation (Medical): No  . Lack of Transportation (Non-Medical): No  Physical Activity: Insufficiently Active  . Days of Exercise per Week: 3 days  . Minutes of Exercise per Session: 40 min  Stress: No Stress Concern Present  . Feeling of Stress : Only a little  Social Connections: Unknown  . Frequency of Communication with Friends and Family: More than three times a week  . Frequency of Social Gatherings with Friends and Family: Not on file  . Attends Religious Services: Not on file  . Active Member of Clubs or Organizations: Not on file  . Attends Archivist Meetings: Not on file  . Marital Status: Married    Tobacco Counseling Counseling given: Not Answered   Clinical Intake:  Pre-visit preparation completed: Yes        Diabetes: No  How often do you need to have someone help you when you read instructions, pamphlets, or other written  materials from your doctor or pharmacy?: 1 - Never    Interpreter Needed?: No      Activities of Daily Living In your present state of health, do you have any difficulty performing the following activities: 03/08/2021  Hearing? Y  Vision? N  Difficulty concentrating or making decisions? N  Walking or climbing stairs? N  Dressing or bathing? N  Doing errands, shopping? N  Preparing Food and eating ? N  Using the Toilet? N  In the past six  months, have you accidently leaked urine? N  Do you have problems with loss of bowel control? N  Managing your Medications? N  Managing your Finances? N  Housekeeping or managing your Housekeeping? N  Some recent data might be hidden    Patient Care Team: Burnard Hawthorne, FNP as PCP - General (Family Medicine) Rockey Situ Kathlene November, MD as Consulting Physician (Cardiology) Pieter Partridge, DO as Consulting Physician (Neurology)  Indicate any recent Medical Services you may have received from other than [facility] providers in the past year (date may be approximate).     Assessment:   This is a routine wellness examination for Meghan Mack.  I connected with Joene today by telephone and verified that I am speaking with the correct person using two identifiers. Location patient: home Location provider: work Persons participating in the virtual visit: patient, Marine scientist.    I discussed the limitations, risks, security and privacy concerns of performing an evaluation and management service by telephone and the availability of in person appointments. The patient expressed understanding and verbally consented to this telephonic visit.    Interactive audio and video telecommunications were attempted between this provider and patient, however failed, due to patient having technical difficulties OR patient did not have access to video capability.  We continued and completed visit with audio only.  Some vital signs may be absent or patient reported.   Hearing/Vision  screen  Hearing Screening   125Hz  250Hz  500Hz  1000Hz  2000Hz  3000Hz  4000Hz  6000Hz  8000Hz   Right ear:           Left ear:           Comments: Hearing aids, bilateral  Vision Screening Comments: Followed by [facility] Wears corrective lenses Glaucoma suspect; drops in use Visual acuity not assessed, virtual visit.  They have seen their ophthalmologist in the last 12 months.     Dietary issues and exercise activities discussed: Current Exercise Habits: Home exercise routine, Type of exercise: walking, Time (Minutes): 40, Frequency (Times/Week): 3, Weekly Exercise (Minutes/Week): 120, Intensity: Mild  Healthy diet Fair water intake  Goals Addressed   None    Depression Screen PHQ 2/9 Scores 03/08/2021 06/17/2020 03/07/2020 11/13/2019 08/12/2019 06/12/2019 04/16/2019  PHQ - 2 Score 0 0 0 0 0 0 0  PHQ- 9 Score - 4 - - - - 0    Fall Risk Fall Risk  03/08/2021 01/23/2021 03/07/2020 12/29/2019 11/13/2019  Falls in the past year? 0 1 0 1 0  Number falls in past yr: 0 0 - 1 0  Injury with Fall? 0 0 - 1 -  Risk Factor Category  - - - - -  Risk for fall due to : - - - - -  Risk for fall due to: Comment - - - - -  Follow up Falls evaluation completed - Falls evaluation completed - Falls evaluation completed    FALL RISK PREVENTION PERTAINING TO THE HOME: Handrails in use when climbing stairs? Yes Home free of loose throw rugs in walkways, pet beds, electrical cords, etc? Yes  Adequate lighting in your home to reduce risk of falls? Yes   TIMED UP AND GO: Was the test performed? No .   Cognitive Function: Patient is alert and oriented x3.  Enjoys brain health stimulating activities.  MMSE/6CIT deferred . Normal by direct communication/observation.    MMSE - Mini Mental State Exam 01/01/2018 08/03/2015  Not completed: Refused -  Orientation to time - 5  Orientation to Place - 5  Registration - 3  Attention/ Calculation - 4  Recall - 2  Language- name 2 objects - 2  Language- repeat -  1  Language- follow 3 step command - 3  Language- read & follow direction - 1  Write a sentence - 1  Copy design - 1  Total score - 28   Montreal Cognitive Assessment  01/19/2016  Visuospatial/ Executive (0/5) 3  Naming (0/3) 3  Attention: Read list of digits (0/2) 2  Attention: Read list of letters (0/1) 1  Attention: Serial 7 subtraction starting at 100 (0/3) 0  Language: Repeat phrase (0/2) 2  Language : Fluency (0/1) 1  Abstraction (0/2) 2  Delayed Recall (0/5) 3  Orientation (0/6) 6  Total 23  Adjusted Score (based on education) 23   6CIT Screen 03/07/2020 03/05/2019  What Year? 0 points 0 points  What month? 0 points 0 points  What time? 0 points 0 points  Count back from 20 0 points 0 points  Months in reverse 0 points 0 points  Repeat phrase 2 points 0 points  Total Score 2 0    Immunizations Immunization History  Administered Date(s) Administered  . Influenza Split 07/15/2011, 06/26/2014  . Influenza Whole 06/22/2009, 06/13/2010, 07/05/2012, 07/06/2018  . Influenza, High Dose Seasonal PF 07/06/2018, 07/16/2019  . Influenza,inj,quad, With Preservative 07/16/2019  . Influenza-Unspecified 06/15/2013, 06/19/2015, 06/29/2016, 07/14/2017, 07/16/2019, 07/11/2020  . PFIZER(Purple Top)SARS-COV-2 Vaccination 11/23/2019, 12/18/2019, 08/01/2020  . Pneumococcal Conjugate-13 06/27/2015  . Pneumococcal Polysaccharide-23 08/11/2007  . Td 07/17/2001  . Tdap 09/03/2011  . Zoster 06/13/2010   Health Maintenance Health Maintenance  Topic Date Due  . COVID-19 Vaccine (4 - Booster for [place] series) 06/24/2021 (Originally 11/01/2020)  . INFLUENZA VACCINE  05/15/2021  . TETANUS/TDAP  09/02/2021  . MAMMOGRAM  10/12/2022  . COLONOSCOPY (Pts 45-6yrs Insurance coverage will need to be confirmed)  07/20/2024  . DEXA SCAN  Completed  . Hepatitis C Screening  Completed  . HPV VACCINES  Aged Out  . PNA vac Low Risk Adult  Discontinued    Lung Cancer Screening: (Low Dose CT Chest  recommended if Age 77-80 years, 30 pack-year currently smoking OR have quit w/in 15years.) does not qualify.   Vision Screening: Recommended annual ophthalmology exams for early detection of glaucoma and other disorders of the eye. Is the patient up to date with their annual eye exam?  Yes  Dental Screening: Recommended annual dental exams for proper oral hygiene  Community Resource Referral / Chronic Care Management: CRR required this visit?  No   CCM required this visit?  No      Plan:     I have personally reviewed and noted the following in the patient's chart:   . Medical and social history . Use of alcohol, tobacco or illicit drugs  . Current medications and supplements including opioid prescriptions. Patient is not currently taking opioid.  . Functional ability and status . Nutritional status . Physical activity . Advanced directives . List of other physicians . Hospitalizations, surgeries, and ER visits in previous 12 months . Vitals . Screenings to include cognitive, depression, and falls . Referrals and appointments  In addition, I have reviewed and discussed with patient certain preventive protocols, quality metrics, and best practice recommendations. A written personalized care plan for preventive services as well as general preventive health recommendations were provided to patient via mychart.     Varney Biles, LPN   [id number]    Agree with plan. Mable Paris, NP

## 2021-03-28 ENCOUNTER — Other Ambulatory Visit: Payer: Self-pay

## 2021-03-28 ENCOUNTER — Other Ambulatory Visit (INDEPENDENT_AMBULATORY_CARE_PROVIDER_SITE_OTHER): Payer: Medicare Other

## 2021-03-28 DIAGNOSIS — R509 Fever, unspecified: Secondary | ICD-10-CM | POA: Diagnosis not present

## 2021-03-29 LAB — LYME DISEASE SEROLOGY W/REFLEX: Lyme Total Antibody EIA: NEGATIVE

## 2021-04-24 DIAGNOSIS — Z86018 Personal history of other benign neoplasm: Secondary | ICD-10-CM | POA: Diagnosis not present

## 2021-04-24 DIAGNOSIS — L570 Actinic keratosis: Secondary | ICD-10-CM | POA: Diagnosis not present

## 2021-04-24 DIAGNOSIS — Z872 Personal history of diseases of the skin and subcutaneous tissue: Secondary | ICD-10-CM | POA: Diagnosis not present

## 2021-04-24 DIAGNOSIS — L578 Other skin changes due to chronic exposure to nonionizing radiation: Secondary | ICD-10-CM | POA: Diagnosis not present

## 2021-05-13 ENCOUNTER — Other Ambulatory Visit: Payer: Self-pay | Admitting: Cardiovascular Disease

## 2021-05-21 ENCOUNTER — Other Ambulatory Visit: Payer: Self-pay | Admitting: Cardiovascular Disease

## 2021-05-22 DIAGNOSIS — E559 Vitamin D deficiency, unspecified: Secondary | ICD-10-CM | POA: Diagnosis not present

## 2021-05-22 DIAGNOSIS — E890 Postprocedural hypothyroidism: Secondary | ICD-10-CM | POA: Diagnosis not present

## 2021-05-31 DIAGNOSIS — Z833 Family history of diabetes mellitus: Secondary | ICD-10-CM | POA: Diagnosis not present

## 2021-05-31 DIAGNOSIS — E890 Postprocedural hypothyroidism: Secondary | ICD-10-CM | POA: Diagnosis not present

## 2021-05-31 DIAGNOSIS — E559 Vitamin D deficiency, unspecified: Secondary | ICD-10-CM | POA: Diagnosis not present

## 2021-06-09 ENCOUNTER — Telehealth: Payer: Medicare Other | Admitting: Family Medicine

## 2021-06-09 ENCOUNTER — Ambulatory Visit
Admission: RE | Admit: 2021-06-09 | Discharge: 2021-06-09 | Disposition: A | Discharge status: Home / Self Care | Payer: Medicare Other | Source: Ambulatory Visit | Attending: Emergency Medicine | Admitting: Emergency Medicine

## 2021-06-09 ENCOUNTER — Other Ambulatory Visit: Payer: Self-pay

## 2021-06-09 VITALS — BP 142/89 | HR 99 | Temp 98.6°F | Resp 16

## 2021-06-09 DIAGNOSIS — B349 Viral infection, unspecified: Secondary | ICD-10-CM | POA: Diagnosis not present

## 2021-06-09 LAB — POCT RAPID STREP A (OFFICE): Rapid Strep A Screen: NEGATIVE

## 2021-06-09 NOTE — ED Provider Notes (Signed)
[facility]    CSN: [id number] Arrival date & time: 06/09/21  [id number]      History   Chief Complaint Chief Complaint  Patient presents with   Appointment    0945   Cough   Sore Throat    HPI Meghan Mack is a 70 y.o. female.  Patient presents with 4-5 day history of postnasal drip, sore throat, fever, cough.  T-max 101.6.  She denies rash, earache, shortness of breath, vomiting, diarrhea, or other symptoms.  Treatment at home with Tylenol and ibuprofen.  Her medical history includes COPD, PSVT, IBS, Sjogren's syndrome.  The history is provided by the patient and medical records.   Past Medical History:  Diagnosis Date   Anemia    Colonic polyp    Constipation    COPD (chronic obstructive pulmonary disease) (HCC)    Hyperlipidemia    IBS (irritable bowel syndrome)    Kidney cysts    Lung nodule    Meningioma (HCC)    Nausea    Palpitations    Tonsillar cyst     Patient Active Problem List   Diagnosis Date Noted   Fever 02/14/2021   Former smoker 06/17/2020   Right ankle swelling 06/17/2020   Trochanteric bursitis of left hip    Raynaud's phenomenon 01/13/2020   Abnormal MRI, lumbar spine 08/12/2019   Sjogren's syndrome ([place]) 08/12/2019   Headache 05/22/2019   Change in bowel movement 10/03/2018   Dysuria 07/09/2018   Injection site reaction 07/09/2018   Bug bite 07/09/2018   Coronary artery calcification seen on CT scan 05/05/2018   Aortic atherosclerosis ([place]) 05/05/2018   Lung nodule 10/21/2017   Hematuria 10/21/2017   Abnormal weight loss 10/21/2017   Bilateral groin pain 07/08/2017   Snoring 08/07/2016   Increased intraocular pressure 08/07/2016   Routine physical examination 05/16/2016   Medicare annual wellness visit, initial 06/27/2015   Meningioma ([place]) 06/27/2015   Breast nodule 10/19/2014   Right knee pain 08/11/2012   Screening for osteoporosis 08/11/2012   Menopausal hot flushes 08/11/2012   Tinnitus of left ear 01/23/2012    Anxiety 01/23/2012   Gallbladder polyp versus tiny stones on ultrasound 08/08/2011   Tachycardia 02/24/2010   Hyperlipidemia 08/19/2009   Osteopenia 08/19/2009   Obstructive sleep apnea 08/16/2008   MEMORY LOSS 08/06/2008   VITAMIN D DEFICIENCY 04/14/2008   DEGENERATIVE JOINT DISEASE, KNEE 04/14/2008   HIP PAIN, RIGHT 10/07/2007   Hypothyroidism 08/13/2007   FIBROCYSTIC BREAST DISEASE 08/13/2007   SJOGREN'S SYNDROME 08/13/2007   COLONIC POLYPS, HX OF 08/13/2007   Paroxysmal supraventricular tachycardia ([place]) 03/18/2007    Past Surgical History:  Procedure Laterality Date   CARDIAC CATHETERIZATION  02/25/2007   COLONOSCOPY     ESOPHAGOGASTRODUODENOSCOPY  10/1997 & 08/2005   FOOT SURGERY  04/25/2007   right   THYROIDECTOMY  11/2005   VAGINAL HYSTERECTOMY     ovaries intact    OB History   No obstetric history on file.      Home Medications    Prior to Admission medications   Medication Sig Start Date End Date Taking? Authorizing Provider  BYSTOLIC 10 MG tablet TAKE 0.5 TABLETS BY MOUTH 2 TIMES DAILY. 05/15/21   Minna Merritts, MD  ergocalciferol (VITAMIN D2) 50000 UNITS capsule Take 50,000 Units by mouth every 30 (thirty) days.    [provider]  ibuprofen (ADVIL,MOTRIN) 600 MG tablet Take 1 tablet (600 mg total) by mouth every 8 (eight) hours as needed. 11/27/17  Lisa Roca, MD  latanoprost (XALATAN) 0.005 % ophthalmic solution SMARTSIG:In Eye(s) 09/27/20   [provider]  levothyroxine (SYNTHROID, LEVOTHROID) 88 MCG tablet Take 1 tablet 6 days a week.    [provider]  Multiple Vitamin (MULTIVITAMIN PO) Take 1 tablet by mouth daily.    [provider]  simvastatin (ZOCOR) 20 MG tablet TAKE 1 TABLET BY MOUTH EVERY DAY IN THE EVENING 05/22/21   Minna Merritts, MD  triamcinolone cream (KENALOG) 0.1 % Apply to affected areas on back/body 1-2 times a day as needed for itch. Avoid face, groin, underarms. 08/17/20   Brendolyn Patty,  MD    Family History Family History  Problem Relation Age of Onset   Cancer Mother        colon   Emphysema Father    Heart disease Father    Cancer Father        leukemia   Cancer Brother        lung   Cancer Maternal Aunt        melanoma   Cancer Maternal Grandfather        stomach, colon   Breast cancer Neg Hx    Kidney cancer Neg Hx    Kidney disease Neg Hx    Prostate cancer Neg Hx     Social History Social History   Tobacco Use   Smoking status: Former    Packs/day: 0.25    Years: 10.00    Pack years: 2.50    Types: Cigarettes    Quit date: 09/25/1997    Years since quitting: 23.7   Smokeless tobacco: Never  Vaping Use   Vaping Use: Never used  Substance Use Topics   Alcohol use: No   Drug use: No     Allergies   Diflucan [fluconazole], Tdap [tetanus-diphth-acell pertussis], Codeine, Propantheline bromide, Sulfa antibiotics, Sulfonamide derivatives, Esomeprazole magnesium, Gadolinium derivatives, Meperidine hcl, Pneumovax 23 [pneumococcal vac polyvalent], and Propoxyphene n-acetaminophen   Review of Systems Review of Systems  Constitutional:  Positive for fever. Negative for chills.  HENT:  Positive for postnasal drip and sore throat. Negative for ear pain.   Respiratory:  Positive for cough. Negative for shortness of breath.   Cardiovascular:  Negative for chest pain and palpitations.  Gastrointestinal:  Negative for abdominal pain, diarrhea and vomiting.  Skin:  Negative for color change and rash.  All other systems reviewed and are negative.   Physical Exam Triage Vital Signs ED Triage Vitals  Enc Vitals Group     BP      Pulse      Resp      Temp      Temp src      SpO2      Weight      Height      Head Circumference      Peak Flow      Pain Score      Pain Loc      Pain Edu?      Excl. in [place]?    No data found.  Updated Vital Signs BP (!) 142/89 (BP Location: Left Arm)   Pulse 99   Temp 98.6 F (37 C) (Oral)   Resp 16    SpO2 100%   Visual Acuity Right Eye Distance:   Left Eye Distance:   Bilateral Distance:    Right Eye Near:   Left Eye Near:    Bilateral Near:     Physical Exam Vitals and nursing note  reviewed.  Constitutional:      General: She is not in acute distress.    Appearance: She is well-developed. She is not ill-appearing.  HENT:     Head: Normocephalic and atraumatic.     Right Ear: Tympanic membrane normal.     Left Ear: Tympanic membrane normal.     Nose: Nose normal.     Mouth/Throat:     Mouth: Mucous membranes are moist.     Pharynx: Oropharynx is clear.  Eyes:     Conjunctiva/sclera: Conjunctivae normal.  Cardiovascular:     Rate and Rhythm: Normal rate and regular rhythm.     Heart sounds: Normal heart sounds.  Pulmonary:     Effort: Pulmonary effort is normal. No respiratory distress.     Breath sounds: Normal breath sounds.  Abdominal:     Palpations: Abdomen is soft.     Tenderness: There is no abdominal tenderness.  Musculoskeletal:     Cervical back: Neck supple.  Skin:    General: Skin is warm and dry.  Neurological:     General: No focal deficit present.     Mental Status: She is alert and oriented to person, place, and time.     Gait: Gait normal.  Psychiatric:        Mood and Affect: Mood normal.        Behavior: Behavior normal.     UC Treatments / Results  Labs (all labs ordered are listed, but only abnormal results are displayed) Labs Reviewed  COVID-19, FLU A+B NAA  POCT RAPID STREP A (OFFICE)    EKG   Radiology No results found.  Procedures Procedures (including critical care time)  Medications Ordered in UC Medications - No data to display  Initial Impression / Assessment and Plan / UC Course  I have reviewed the triage vital signs and the nursing notes.  Pertinent labs & imaging results that were available during my care of the patient were reviewed by me and considered in my medical decision making (see chart for  details).   Viral illness.  Rapid strep negative. COVID and Flu pending.  Instructed patient to self quarantine per CDC guidelines.  Discussed symptomatic treatment including Tylenol or ibuprofen, rest, hydration.  Instructed patient to follow up with PCP if symptoms are not improving.  Patient agrees to plan of care.    Final Clinical Impressions(s) / UC Diagnoses   Final diagnoses:  Viral illness     Discharge Instructions      Your rapid strep test is negative.    Your COVID and Flu tests are pending.  You should self quarantine until the test results are back.    Take Tylenol or ibuprofen as needed for fever or discomfort.  Rest and keep yourself hydrated.    Follow-up with your primary care provider if your symptoms are not improving.           ED Prescriptions   None    PDMP not reviewed this encounter.   Sharion Balloon, NP 06/09/21 [phone]

## 2021-06-09 NOTE — ED Triage Notes (Signed)
Patient presents to Urgent Care with complaints of cough, fever, and sore throat x 4 days. Pt states she has had 4 negative covid tests. She has a hx of sinus infections. Treating with tylenol and advil.

## 2021-06-09 NOTE — Discharge Instructions (Addendum)
Your rapid strep test is negative.    Your COVID and Flu tests are pending.  You should self quarantine until the test results are back.    Take Tylenol or ibuprofen as needed for fever or discomfort.  Rest and keep yourself hydrated.    Follow-up with your primary care provider if your symptoms are not improving.

## 2021-06-10 LAB — COVID-19, FLU A+B NAA
Influenza A, NAA: NOT DETECTED
Influenza B, NAA: NOT DETECTED
SARS-CoV-2, NAA: DETECTED — AB

## 2021-06-16 ENCOUNTER — Other Ambulatory Visit: Payer: Self-pay | Admitting: Family

## 2021-06-16 DIAGNOSIS — Z1231 Encounter for screening mammogram for malignant neoplasm of breast: Secondary | ICD-10-CM

## 2021-07-07 ENCOUNTER — Encounter: Payer: Medicare Other | Admitting: Family

## 2021-07-07 NOTE — Progress Notes (Signed)
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  Headache is infrequent and visual phenomena not bothersome to her.  If they become concerning, she will contact me to follow up and we can start a preventative Follow up as needed.   Subjective:  Shilynn Hoch is a 70 year old right-handed female with COPD, IBS, anxiety, Sjogren's, hypothyroidism and hyperlipidemia and meningioma who follows up primary stabbing headache.     UPDATE: In April, she endorsed a new headache, a sqeezing sensation cold water left top off and on daily for a week.  Takes Advil.  MRI of brain without contrast on 02/13/2021 personally reviewed again showed stable 6 x 9 mm meningioma over the left anterior tentorium but no acute abnormality.    Stabbing headaches stable - moderate to severe but paroxysmal throughout the day, averaging once a week.   Still has consistent visual phenomena - Flashing circles when she is exposed to sunlight or jumping lines when looking at checkered patterns.  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     Current NSAIDS:  ASA 81mg ; ibuprofen Current Antihypertensive medications:  Bystolic Current Vitamins/Herbal/Supplements:  D Other medications:  Simvastatin 20mg , Synthroid   HISTORY: She has had headaches off and on since around age 58.  It is a moderate to severe paroxysmal sharp pain, like a lightening bolt, brief and occurs in various areas over her head.  It can occur once or several times.  It came back in August.  They occurred off and on for 3 days in a row.  It seems to occur during rainy weather. Since then, she has had 1 or 2 spells also during rainy weather.  Separately, she has episodes of visual disturbance, in which she sees a circle surrounded by bright light in her right eye  when she walks out into a bright environment (like walking outside in the sun.  It typically lasts a few seconds and occurs infrequently.  Also, if she looks at a checkered pattern, the lines start jumping.     No prior history of headaches.   MRI of brain with and without contrast on 06/05/19 showed stable 6 x 9 mm meningioma along the anterior tentorium.  MRA of head showed intracranial atheroclerosis with stenosis involving the right P2 segment and left ICA at skull base.  Carotid doppler from 06/24/19 showed no hemodynamically significant ICA stenosis and demonstrated antegrade flow in both vertebral arteries.  PAST MEDICAL HISTORY: Past Medical History:  Diagnosis Date   Anemia    Colonic polyp    Constipation    COPD (chronic obstructive pulmonary disease) (HCC)    Hyperlipidemia    IBS (irritable bowel syndrome)    Kidney cysts    Lung nodule    Meningioma (HCC)    Nausea    Palpitations    Tonsillar cyst     MEDICATIONS: Current Outpatient Medications on File Prior to Visit  Medication Sig Dispense Refill   BYSTOLIC 10 MG tablet TAKE 0.5 TABLETS BY MOUTH 2 TIMES DAILY. 90 tablet 0   ergocalciferol (VITAMIN D2) 50000 UNITS capsule Take 50,000 Units by mouth every 30 (thirty) days.     ibuprofen (ADVIL,MOTRIN) 600 MG tablet Take 1 tablet (600 mg total) by mouth every 8 (eight) hours as needed. 20 tablet 0   latanoprost (XALATAN) 0.005 % ophthalmic solution SMARTSIG:In  Eye(s)     levothyroxine (SYNTHROID, LEVOTHROID) 88 MCG tablet Take 1 tablet 6 days a week.     Multiple Vitamin (MULTIVITAMIN PO) Take 1 tablet by mouth daily.     simvastatin (ZOCOR) 20 MG tablet TAKE 1 TABLET BY MOUTH EVERY DAY IN THE EVENING 90 tablet 0   triamcinolone cream (KENALOG) 0.1 % Apply to affected areas on back/body 1-2 times a day as needed for itch. Avoid face, groin, underarms. 80 g 1   No current facility-administered medications on file prior to visit.    ALLERGIES: Allergies  Allergen  Reactions   Diflucan [Fluconazole] Hives   Tdap [Tetanus-Diphth-Acell Pertussis] Swelling and Rash   Codeine    Propantheline Bromide    Sulfa Antibiotics Other (See Comments)   Sulfonamide Derivatives    Esomeprazole Magnesium Nausea Only   Gadolinium Derivatives Palpitations    gabastat used 05/2019 for MRI/MRA made heart race.    Meperidine Hcl Palpitations    Causes heart to race   Pneumovax 23 [Pneumococcal Vac Polyvalent] Swelling    Extensive local reaction with arm swelling and induration   Propoxyphene N-Acetaminophen Palpitations    FAMILY HISTORY: Family History  Problem Relation Age of Onset   Cancer Mother        colon   Emphysema Father    Heart disease Father    Cancer Father        leukemia   Cancer Brother        lung   Cancer Maternal Aunt        melanoma   Cancer Maternal Grandfather        stomach, colon   Breast cancer Neg Hx    Kidney cancer Neg Hx    Kidney disease Neg Hx    Prostate cancer Neg Hx       Objective:  Blood pressure (!) 141/89, pulse 89, height 5\' 7"  (1.702 m), weight 151 lb (68.5 kg), SpO2 96 %. General: No acute distress.  Patient appears well-groomed.     Metta Clines, DO  CC: Mable Paris, FNP

## 2021-07-09 NOTE — Progress Notes (Signed)
Cardiology Office Note  Date:  07/11/2021   ID:  Meghan Mack, Meghan Mack 01-24-1951, MRN [id number]  PCP:  Burnard Hawthorne, FNP   Chief Complaint  Patient presents with   12 month follow up     "Doing well." Medications reviewed by the patient verbally.     HPI:  Meghan Mack is a very pleasant 70 year old woman with a history of  palpitations,  anxiety,  remote history of chest pain,  hyperlipidemia  30 day monitor in 2014 showed APCs,short runs atrial tachycardia/SVT with rate 166  hypertension  mild coronary calcification of the LAD and RCA on CT MRI with moderate stenosis cerebral vascular disease who presents for routine followup  Of her coronary calcifications, Aortic atherosclerosis, tachycardia peripheral arterial disease  Last seen in clinic September 2021 Husband passed, prostate cancer Adjusting to his  passing  Rare fluttering, palpitations Takes bystolic 5 mg daily  Compression hose in place for chronic lower extremity lymphedema  Tolerating statin, simvastatin Previously on Zetia, stopped secondary to stomach issues  No recent lab work  Prior CT scan reviewed showing mild coronary calcification LAD, RCA, aortic atherosclerosis mild  lab work reviewed  total cholesterol 154 LDL 64 Normal BMP Hemoglobin A1c 5.7  EKG personally reviewed by myself on todays visit Normal sinus rhythm rate 81 bpm no significant ST or T wave changes  Past medical history reviewed MRI, was having sharp pain in head Moderate stenosis right P2 segment Moderate to severe stenosis left internal carotid artery at the skull base and moderate stenosis right internal carotid artery at the skull base. Mild atherosclerotic disease right anterior cerebral Artery.   PMH:   has a past medical history of Anemia, Colonic polyp, Constipation, COPD (chronic obstructive pulmonary disease) ([place]), Hyperlipidemia, IBS (irritable bowel syndrome), Kidney cysts, Lung nodule, Meningioma  ([place]), Nausea, Palpitations, and Tonsillar cyst.  PSH:    Past Surgical History:  Procedure Laterality Date   CARDIAC CATHETERIZATION  02/25/2007   COLONOSCOPY     ESOPHAGOGASTRODUODENOSCOPY  10/1997 & 08/2005   FOOT SURGERY  04/25/2007   right   THYROIDECTOMY  11/2005   VAGINAL HYSTERECTOMY     ovaries intact    Current Outpatient Medications  Medication Sig Dispense Refill   ergocalciferol (VITAMIN D2) 50000 UNITS capsule Take 50,000 Units by mouth every 30 (thirty) days.     ibuprofen (ADVIL,MOTRIN) 600 MG tablet Take 1 tablet (600 mg total) by mouth every 8 (eight) hours as needed. 20 tablet 0   latanoprost (XALATAN) 0.005 % ophthalmic solution SMARTSIG:In Eye(s)     levothyroxine (SYNTHROID, LEVOTHROID) 88 MCG tablet Take 1 tablet 6 days a week.     Multiple Vitamin (MULTIVITAMIN PO) Take 1 tablet by mouth daily.     nebivolol (BYSTOLIC) 10 MG tablet Take 10 mg by mouth in the morning and at bedtime.     simvastatin (ZOCOR) 20 MG tablet TAKE 1 TABLET BY MOUTH EVERY DAY IN THE EVENING 90 tablet 0   triamcinolone cream (KENALOG) 0.1 % Apply to affected areas on back/body 1-2 times a day as needed for itch. Avoid face, groin, underarms. 80 g 1   No current facility-administered medications for this visit.     Allergies:   Diflucan [fluconazole], Tdap [tetanus-diphth-acell pertussis], Codeine, Propantheline bromide, Sulfa antibiotics, Sulfonamide derivatives, Esomeprazole magnesium, Gadolinium derivatives, Meperidine hcl, Pneumovax 23 [pneumococcal vac polyvalent], and Propoxyphene n-acetaminophen   Social History:  The patient  reports that she quit smoking about 23 years ago. Her smoking use  included cigarettes. She has a 2.50 pack-year smoking history. She has never used smokeless tobacco. She reports that she does not drink alcohol and does not use drugs.   Family History:   family history includes Cancer in her brother, father, maternal aunt, maternal grandfather, and mother;  Emphysema in her father; Heart disease in her father.    Review of Systems: Review of Systems  Constitutional: Negative.   HENT: Negative.    Respiratory: Negative.    Cardiovascular: Negative.   Gastrointestinal: Negative.   Musculoskeletal: Negative.   Neurological: Negative.   Psychiatric/Behavioral: Negative.    All other systems reviewed and are negative.  PHYSICAL EXAM: VS:  BP (!) 148/82 (BP Location: Left Arm, Patient Position: Sitting, Cuff Size: Normal)   Pulse 81   Ht 5' 7.5" (1.715 m)   Wt 150 lb 6 oz (68.2 kg)   SpO2 97%   BMI 23.20 kg/m  , BMI Body mass index is 23.2 kg/m. Constitutional:  oriented to person, place, and time. No distress.  HENT:  Head: Grossly normal Eyes:  no discharge. No scleral icterus.  Neck: No JVD, no carotid bruits  Cardiovascular: Regular rate and rhythm, no murmurs appreciated Pulmonary/Chest: Clear to auscultation bilaterally, no wheezes or rails Abdominal: Soft.  no distension.  no tenderness.  Musculoskeletal: Normal range of motion Neurological:  normal muscle tone. Coordination normal. No atrophy Skin: Skin warm and dry Psychiatric: normal affect, pleasant  Recent Labs: No results found for requested labs within last 8760 hours.    Lipid Panel Lab Results  Component Value Date   CHOL 154 07/07/2020   HDL 75.40 07/07/2020   LDLCALC 64 07/07/2020   TRIG 72.0 07/07/2020      Wt Readings from Last 3 Encounters:  07/11/21 150 lb 6 oz (68.2 kg)  07/10/21 151 lb (68.5 kg)  03/08/21 151 lb (68.5 kg)      ASSESSMENT AND PLAN:  Peripheral arterial disease Stressed importance of aggressive lipid control Recent weight loss, has lab work pending through primary care typically done once a year around September  Tachycardia - Periodic episodes, moderately controlled with low-dose beta-blocker Extra beta-blocker as needed for bad days   Obstructive sleep apnea - Reports sleeping relatively well Weight down  TOBACCO  ABUSE - Plan: EKG 12-Lead Prior history of smoking Stop smoking  Mixed hyperlipidemia - Plan: EKG 12-Lead Did not tolerate Zetia,  Continue simvastatin 20 for now, weight trending down, cooking for one  CAD Mild coronary calcification and mild to moderate diffuse aortic atherosclerosis Continue aggressive lipid management Denies anginal symptoms    Total encounter time more than 25 minutes  Greater than 50% was spent in counseling and coordination of care with the patient    Orders Placed This Encounter  Procedures   EKG 12-Lead     Signed, Esmond Plants, M.D., Ph.D. 07/11/2021  [place], [place]

## 2021-07-10 ENCOUNTER — Ambulatory Visit: Payer: Medicare Other | Admitting: Neurology

## 2021-07-10 ENCOUNTER — Encounter: Payer: Self-pay | Admitting: Neurology

## 2021-07-10 ENCOUNTER — Other Ambulatory Visit: Payer: Self-pay

## 2021-07-10 VITALS — BP 141/89 | HR 89 | Ht 67.0 in | Wt 151.0 lb

## 2021-07-10 DIAGNOSIS — D329 Benign neoplasm of meninges, unspecified: Secondary | ICD-10-CM

## 2021-07-10 DIAGNOSIS — G43109 Migraine with aura, not intractable, without status migrainosus: Secondary | ICD-10-CM | POA: Diagnosis not present

## 2021-07-10 DIAGNOSIS — G4485 Primary stabbing headache: Secondary | ICD-10-CM

## 2021-07-10 NOTE — Patient Instructions (Signed)
If the headaches become worse or if you want to start a medication for migraine (the visual symptoms), contact me and we can start something and have you follow up.

## 2021-07-11 ENCOUNTER — Ambulatory Visit: Payer: Medicare Other | Admitting: Cardiovascular Disease

## 2021-07-11 ENCOUNTER — Encounter: Payer: Self-pay | Admitting: Cardiovascular Disease

## 2021-07-11 VITALS — BP 148/82 | HR 81 | Ht 67.5 in | Wt 150.4 lb

## 2021-07-11 DIAGNOSIS — I700 Atherosclerosis of aorta: Secondary | ICD-10-CM | POA: Diagnosis not present

## 2021-07-11 DIAGNOSIS — I471 Supraventricular tachycardia: Secondary | ICD-10-CM

## 2021-07-11 DIAGNOSIS — I739 Peripheral vascular disease, unspecified: Secondary | ICD-10-CM | POA: Diagnosis not present

## 2021-07-11 DIAGNOSIS — E782 Mixed hyperlipidemia: Secondary | ICD-10-CM | POA: Diagnosis not present

## 2021-07-11 DIAGNOSIS — F17200 Nicotine dependence, unspecified, uncomplicated: Secondary | ICD-10-CM | POA: Diagnosis not present

## 2021-07-11 DIAGNOSIS — I2510 Atherosclerotic heart disease of native coronary artery without angina pectoris: Secondary | ICD-10-CM | POA: Diagnosis not present

## 2021-07-11 DIAGNOSIS — G4733 Obstructive sleep apnea (adult) (pediatric): Secondary | ICD-10-CM

## 2021-07-11 MED ORDER — NEBIVOLOL HCL 5 MG PO TABS: 5.0000 mg | ORAL_TABLET | Freq: Every day | ORAL | 3 refills | Status: DC

## 2021-07-11 MED ORDER — SIMVASTATIN 20 MG PO TABS: ORAL_TABLET | ORAL | 3 refills | Status: DC

## 2021-07-11 NOTE — Patient Instructions (Addendum)
Medication Instructions:   Please Take Bystolic 5 mg daily  May take extra 5 mg  as needed for breakthrough tachycardia  If you need a refill on your cardiac medications before your next appointment, please call your pharmacy.   Lab work: No new labs needed  Testing/Procedures: No new testing needed  Follow-Up: At [facility], you and your health needs are our priority.  As part of our continuing mission to provide you with exceptional heart care, we have created designated Provider Care Teams.  These Care Teams include your primary Cardiologist (physician) and Advanced Practice Providers (APPs -  Physician Assistants and Nurse Practitioners) who all work together to provide you with the care you need, when you need it.  You will need a follow up appointment in 12 months  Providers on your designated Care Team:   Murray Hodgkins, NP Christell Faith, PA-C Marrianne Mood, PA-C Cadence Van [place], [place]  COVID-19 Vaccine Information can be found at: [email or web address] For questions related to vaccine distribution or appointments, please email vaccine@[email or web address] .[email or web address] or call [phone].

## 2021-07-14 ENCOUNTER — Other Ambulatory Visit: Payer: Self-pay

## 2021-07-14 ENCOUNTER — Encounter: Payer: Self-pay | Admitting: Family

## 2021-07-14 ENCOUNTER — Ambulatory Visit (INDEPENDENT_AMBULATORY_CARE_PROVIDER_SITE_OTHER): Payer: Medicare Other | Admitting: Family

## 2021-07-14 VITALS — BP 128/76 | HR 86 | Temp 98.3°F | Ht 67.5 in | Wt 151.0 lb

## 2021-07-14 DIAGNOSIS — Z0000 Encounter for general adult medical examination without abnormal findings: Secondary | ICD-10-CM

## 2021-07-14 DIAGNOSIS — I471 Supraventricular tachycardia: Secondary | ICD-10-CM | POA: Diagnosis not present

## 2021-07-14 DIAGNOSIS — Z23 Encounter for immunization: Secondary | ICD-10-CM

## 2021-07-14 DIAGNOSIS — F4321 Adjustment disorder with depressed mood: Secondary | ICD-10-CM

## 2021-07-14 LAB — URINALYSIS, ROUTINE W REFLEX MICROSCOPIC
Bilirubin Urine: NEGATIVE
Hgb urine dipstick: NEGATIVE
Ketones, ur: NEGATIVE
Leukocytes,Ua: NEGATIVE
Nitrite: NEGATIVE
RBC / HPF: NONE SEEN (ref 0–?)
Specific Gravity, Urine: 1.015 (ref 1.000–1.030)
Total Protein, Urine: NEGATIVE
Urine Glucose: NEGATIVE
Urobilinogen, UA: 0.2 (ref 0.0–1.0)
pH: 6 (ref 5.0–8.0)

## 2021-07-14 MED ORDER — BUSPIRONE HCL 7.5 MG PO TABS: 7.5000 mg | ORAL_TABLET | Freq: Two times a day (BID) | ORAL | 1 refills | Status: AC | PRN

## 2021-07-14 MED ORDER — BUSPIRONE HCL 7.5 MG PO TABS: 7.5000 mg | ORAL_TABLET | Freq: Two times a day (BID) | ORAL | 1 refills | Status: DC | PRN

## 2021-07-14 NOTE — Assessment & Plan Note (Signed)
Overall, patient is coping very well.  She will continue following with grief counselor.  I provided her with BuSpar 7.5 mg to be taken twice daily as needed.  Counseled her this medication is more effective when taking BID consistently.  She will consider this.  She will let me know how she is doing.

## 2021-07-14 NOTE — Progress Notes (Signed)
Subjective:    Patient ID: Meghan Mack, female    DOB: 1951-05-07, 70 y.o.   MRN: [id number]  CC: Rachal Dvorsky is a 70 y.o. female who presents today for physical exam.    HPI: Husband passed away suddenly 2 months ago.  He was under hospice care.  She overall feels that she is coping well and her children are supporting her.  She does endorse periods of feeling more anxious.  In the past she had been on Lexapro, Paxil, BuSpar.  She would like to have something to take as needed.  She is sleeping well.  Denies depression.  She continues to follow with grief counselor.  No thoughts of hurting herself or anyone else  HTN-compliant with Bystolic 5 mg daily.  Denies chest pain, palpitations or shortness of breath  At home BP 128/89 this morning.   Hasnt required taking extra dose of bystolic.  She continues to follow with Dr. Rockey Situ for PSVT.    Colorectal Cancer Screening: UTD, repeat in 5 years. Abstracted in chart , no report  Breast Cancer Screening: Mammogram UTD, scheduled Cervical Cancer Screening: Following with OB GYN in [place]. She continues to have pap smear.  She does note some vaginal itching.  Denies unusual vaginal discharge, fever, chills, dysuria, urinary frequency, hematuria.  She would like her urine checked today. Bone Health screening/DEXA for 65+: History of osteopenia, last bone density 2021          Tetanus - UTD       Exercise: Gets regular exercise.   Alcohol use:  none Smoking/tobacco use: Former smoker quit 1998   HISTORY:  Past Medical History:  Diagnosis Date   Anemia    Colonic polyp    Constipation    COPD (chronic obstructive pulmonary disease) (HCC)    Hyperlipidemia    IBS (irritable bowel syndrome)    Kidney cysts    Lung nodule    Meningioma (HCC)    Nausea    Palpitations    Tonsillar cyst     Past Surgical History:  Procedure Laterality Date   CARDIAC CATHETERIZATION  02/25/2007   COLONOSCOPY      ESOPHAGOGASTRODUODENOSCOPY  10/1997 & 08/2005   FOOT SURGERY  04/25/2007   right   THYROIDECTOMY  11/2005   VAGINAL HYSTERECTOMY     ovaries intact   Family History  Problem Relation Age of Onset   Cancer Mother        colon   Emphysema Father    Heart disease Father    Cancer Father        leukemia   Cancer Brother        lung   Cancer Maternal Aunt        melanoma   Cancer Maternal Grandfather        stomach, colon   Breast cancer Neg Hx    Kidney cancer Neg Hx    Kidney disease Neg Hx    Prostate cancer Neg Hx       ALLERGIES: Diflucan [fluconazole], Tdap [tetanus-diphth-acell pertussis], Codeine, Propantheline bromide, Sulfa antibiotics, Sulfonamide derivatives, Esomeprazole magnesium, Gadolinium derivatives, Meperidine hcl, Pneumovax 23 [pneumococcal vac polyvalent], and Propoxyphene n-acetaminophen  Current Outpatient Medications on File Prior to Visit  Medication Sig Dispense Refill   ergocalciferol (VITAMIN D2) 50000 UNITS capsule Take 50,000 Units by mouth every 30 (thirty) days.     ibuprofen (ADVIL,MOTRIN) 600 MG tablet Take 1 tablet (600 mg total) by mouth every 8 (eight) hours as  needed. 20 tablet 0   latanoprost (XALATAN) 0.005 % ophthalmic solution SMARTSIG:In Eye(s)     levothyroxine (SYNTHROID, LEVOTHROID) 88 MCG tablet Take 1 tablet 6 days a week.     Multiple Vitamin (MULTIVITAMIN PO) Take 1 tablet by mouth daily.     nebivolol (BYSTOLIC) 5 MG tablet Take 1 tablet (5 mg total) by mouth daily. May take extra 5 mg as needed for tachycardia 100 tablet 3   simvastatin (ZOCOR) 20 MG tablet TAKE 1 TABLET BY MOUTH EVERY DAY IN THE EVENING 90 tablet 3   triamcinolone cream (KENALOG) 0.1 % Apply to affected areas on back/body 1-2 times a day as needed for itch. Avoid face, groin, underarms. 80 g 1   No current facility-administered medications on file prior to visit.    Social History   Tobacco Use   Smoking status: Former    Packs/day: 0.25    Years: 10.00     Pack years: 2.50    Types: Cigarettes    Quit date: 09/25/1997    Years since quitting: 23.8   Smokeless tobacco: Never  Vaping Use   Vaping Use: Never used  Substance Use Topics   Alcohol use: No   Drug use: No    Review of Systems  Constitutional:  Negative for chills, fever and unexpected weight change.  HENT:  Negative for congestion.   Respiratory:  Negative for cough.   Cardiovascular:  Negative for chest pain, palpitations and leg swelling.  Gastrointestinal:  Negative for nausea and vomiting.  Genitourinary:  Negative for difficulty urinating, pelvic pain, vaginal bleeding, vaginal discharge and vaginal pain.  Musculoskeletal:  Negative for arthralgias and myalgias.  Skin:  Negative for rash.  Neurological:  Negative for headaches.  Hematological:  Negative for adenopathy.  Psychiatric/Behavioral:  Negative for confusion, sleep disturbance and suicidal ideas. The patient is nervous/anxious.      Objective:    BP 128/76 (BP Location: Left Arm, Patient Position: Sitting, Cuff Size: Normal)   Pulse 86   Temp 98.3 F (36.8 C) (Oral)   Ht 5' 7.5" (1.715 m)   Wt 151 lb (68.5 kg)   SpO2 99%   BMI 23.30 kg/m   BP Readings from Last 3 Encounters:  07/14/21 128/76  07/11/21 (!) 148/82  07/10/21 (!) 141/89   Wt Readings from Last 3 Encounters:  07/14/21 151 lb (68.5 kg)  07/11/21 150 lb 6 oz (68.2 kg)  07/10/21 151 lb (68.5 kg)    Physical Exam     Assessment & Plan:   Problem List Items Addressed This Visit       Cardiovascular and Mediastinum   Paroxysmal supraventricular tachycardia ([place])    Asymptomatic as a relates palpitations.  Blood pressure well controlled today.  Advised to continue Bystolic 5 mg and take additional 5mg  dose as needed as directed by Dr. Rockey Situ.  We will follow        Other   Grief reaction    Overall, patient is coping very well.  She will continue following with grief counselor.  I provided her with BuSpar 7.5 mg to be taken  twice daily as needed.  Counseled her this medication is more effective when taking BID consistently.  She will consider this.  She will let me know how she is doing.      Relevant Medications   busPIRone (BUSPAR) 7.5 MG tablet   Routine physical examination - Primary    Patient declines clinical breast exam, pelvic seen with the office that  she continues to follow with OB/GYN.  Requesting colonoscopy records.  Encouraged continued exercise.      Relevant Orders   VITAMIN D 25 Hydroxy (Vit-D Deficiency, Fractures)   Hemoglobin A1c   TSH   Comprehensive metabolic panel   Lipid panel   CBC with Differential/Platelet   Urinalysis, Routine w reflex microscopic   Urine Culture     I am having Luberta Robertson start on busPIRone. I am also having her maintain her Multiple Vitamin (MULTIVITAMIN PO), levothyroxine, ergocalciferol, ibuprofen, triamcinolone cream, latanoprost, nebivolol, and simvastatin.   Meds ordered this encounter  Medications   busPIRone (BUSPAR) 7.5 MG tablet    Sig: Take 1 tablet (7.5 mg total) by mouth 2 (two) times daily as needed.    Dispense:  60 tablet    Refill:  1    Order Specific Question:   Supervising Provider    Answer:   Crecencio Mc [[id number]]     Return precautions given.   Risks, benefits, and alternatives of the medications and treatment plan prescribed today were discussed, and patient expressed understanding.   Education regarding symptom management and diagnosis given to patient on AVS.   Continue to follow with Burnard Hawthorne, FNP for routine health maintenance.   Meghan Mack and I agreed with plan.   Mable Paris, FNP

## 2021-07-14 NOTE — Assessment & Plan Note (Signed)
Patient declines clinical breast exam, pelvic seen with the office that she continues to follow with OB/GYN.  Requesting colonoscopy records.  Encouraged continued exercise.

## 2021-07-14 NOTE — Patient Instructions (Addendum)
We recommend you get the updated bivalent COVID-19 booster, at least 2 months after any prior doses. You may consider delaying a booster dose by 3 months from a prior episode of COVID-19 per the CDC.   You can find pharmacies that have this formulation in stock at [email or web address]    I have prescribed BuSpar which is a medication for anxiety.  As discussed, it is more effective if taken every single day twice a day however if you would prefer to take it as needed, you may do that.  As discussed, if you find yourself taking it twice a day consistently, and then you want to stop the medication, please call the office as I would like to advise you how to safety wean off of the medication.  Please let me know how I can support you after recent passing of your husband.  I am thinking about you.   Nice to see you!

## 2021-07-14 NOTE — Assessment & Plan Note (Signed)
Asymptomatic as a relates palpitations.  Blood pressure well controlled today.  Advised to continue Bystolic 5 mg and take additional 5mg  dose as needed as directed by Dr. Rockey Situ.  We will follow

## 2021-07-15 LAB — URINE CULTURE
MICRO NUMBER:: 12445853
Result:: NO GROWTH
SPECIMEN QUALITY:: ADEQUATE

## 2021-07-18 ENCOUNTER — Other Ambulatory Visit (INDEPENDENT_AMBULATORY_CARE_PROVIDER_SITE_OTHER): Payer: Medicare Other

## 2021-07-18 ENCOUNTER — Other Ambulatory Visit: Payer: Self-pay

## 2021-07-18 DIAGNOSIS — Z0000 Encounter for general adult medical examination without abnormal findings: Secondary | ICD-10-CM

## 2021-07-18 LAB — HEMOGLOBIN A1C: Hgb A1c MFr Bld: 5.6 % (ref 4.6–6.5)

## 2021-07-18 LAB — CBC WITH DIFFERENTIAL/PLATELET
Basophils Absolute: 0 10*3/uL (ref 0.0–0.1)
Basophils Relative: 0.4 % (ref 0.0–3.0)
Eosinophils Absolute: 0.1 10*3/uL (ref 0.0–0.7)
Eosinophils Relative: 2.5 % (ref 0.0–5.0)
HCT: 38.4 % (ref 36.0–46.0)
Hemoglobin: 12.9 g/dL (ref 12.0–15.0)
Lymphocytes Relative: 30.3 % (ref 12.0–46.0)
Lymphs Abs: 1.4 10*3/uL (ref 0.7–4.0)
MCHC: 33.7 g/dL (ref 30.0–36.0)
MCV: 97.4 fl (ref 78.0–100.0)
Monocytes Absolute: 0.4 10*3/uL (ref 0.1–1.0)
Monocytes Relative: 7.7 % (ref 3.0–12.0)
Neutro Abs: 2.7 10*3/uL (ref 1.4–7.7)
Neutrophils Relative %: 59.1 % (ref 43.0–77.0)
Platelets: 186 10*3/uL (ref 150.0–400.0)
RBC: 3.94 Mil/uL (ref 3.87–5.11)
RDW: 13.2 % (ref 11.5–15.5)
WBC: 4.6 10*3/uL (ref 4.0–10.5)

## 2021-07-18 LAB — COMPREHENSIVE METABOLIC PANEL
ALT: 18 U/L (ref 0–35)
AST: 21 U/L (ref 0–37)
Albumin: 4.2 g/dL (ref 3.5–5.2)
Alkaline Phosphatase: 99 U/L (ref 39–117)
BUN: 8 mg/dL (ref 6–23)
CO2: 28 mEq/L (ref 19–32)
Calcium: 9.4 mg/dL (ref 8.4–10.5)
Chloride: 103 mEq/L (ref 96–112)
Creatinine, Ser: 0.68 mg/dL (ref 0.40–1.20)
GFR: 87.64 mL/min (ref >60.00)
Glucose, Bld: 94 mg/dL (ref 70–99)
Potassium: 4.1 mEq/L (ref 3.5–5.1)
Sodium: 141 mEq/L (ref 135–145)
Total Bilirubin: 0.9 mg/dL (ref 0.2–1.2)
Total Protein: 6.6 g/dL (ref 6.0–8.3)

## 2021-07-18 LAB — LIPID PANEL
Cholesterol: 152 mg/dL (ref 0–200)
HDL: 75.1 mg/dL (ref >39.00)
LDL Cholesterol: 60 mg/dL (ref 0–99)
NonHDL: 77.03
Total CHOL/HDL Ratio: 2
Triglycerides: 87 mg/dL (ref 0.0–149.0)
VLDL: 17.4 mg/dL (ref 0.0–40.0)

## 2021-07-18 LAB — TSH: TSH: 2.02 u[IU]/mL (ref 0.35–5.50)

## 2021-07-18 LAB — VITAMIN D 25 HYDROXY (VIT D DEFICIENCY, FRACTURES): VITD: 54.95 ng/mL (ref 30.00–100.00)

## 2021-07-24 DIAGNOSIS — H40003 Preglaucoma, unspecified, bilateral: Secondary | ICD-10-CM | POA: Diagnosis not present

## 2021-07-28 ENCOUNTER — Other Ambulatory Visit: Payer: Self-pay | Admitting: Family

## 2021-07-28 DIAGNOSIS — F4321 Adjustment disorder with depressed mood: Secondary | ICD-10-CM

## 2021-08-16 ENCOUNTER — Encounter: Payer: Self-pay | Admitting: Family

## 2021-09-12 ENCOUNTER — Telehealth: Payer: Self-pay | Admitting: Cardiovascular Disease

## 2021-09-12 NOTE — Telephone Encounter (Signed)
Bystolic is a Tier 3 med on her insurance plan ($47/1 month or $131/3 month supply) but may be more expensive if pt is currently in the donut hole. Do not think [employer] has pt assistance for their Medicare patients.   Recommend she use a GoodRx coupon. Cheapest pricing would be at [place] (~$20/month) or [employer] (~$30 a month).

## 2021-09-12 NOTE — Telephone Encounter (Signed)
Was able to reach back out to Meghan Mack, advised on GoodRx coupon. [facility], [facility], and [facility] is the cheapest.   [name] reports she just got a refill, does not need any until March, but will use GoodRx when time for refill. Advised to call the office so we can send the script to the pharmacy and advise them of bypassing insurance for GoodRx.  [name] is thankful for the return call and advice.

## 2021-09-12 NOTE — Telephone Encounter (Signed)
Patient calling  Insurance told her she may be able to start assistance to help make Bystolic more affordable Patient does not wish to switch medications  Please call to discuss

## 2021-10-04 NOTE — Progress Notes (Signed)
Request sent to [facility].

## 2021-10-17 ENCOUNTER — Ambulatory Visit
Admission: RE | Admit: 2021-10-17 | Discharge: 2021-10-17 | Disposition: A | Discharge status: Home / Self Care | Payer: Medicare Other | Source: Ambulatory Visit | Attending: Family | Admitting: Family

## 2021-10-17 DIAGNOSIS — Z1231 Encounter for screening mammogram for malignant neoplasm of breast: Secondary | ICD-10-CM | POA: Diagnosis not present

## 2021-10-30 ENCOUNTER — Other Ambulatory Visit: Payer: Self-pay

## 2021-10-30 ENCOUNTER — Ambulatory Visit: Payer: Medicare Other | Admitting: Dermatology

## 2021-10-30 DIAGNOSIS — L821 Other seborrheic keratosis: Secondary | ICD-10-CM | POA: Diagnosis not present

## 2021-10-30 DIAGNOSIS — L578 Other skin changes due to chronic exposure to nonionizing radiation: Secondary | ICD-10-CM

## 2021-10-30 DIAGNOSIS — Z1283 Encounter for screening for malignant neoplasm of skin: Secondary | ICD-10-CM | POA: Diagnosis not present

## 2021-10-30 DIAGNOSIS — L814 Other melanin hyperpigmentation: Secondary | ICD-10-CM

## 2021-10-30 DIAGNOSIS — I8393 Asymptomatic varicose veins of bilateral lower extremities: Secondary | ICD-10-CM

## 2021-10-30 DIAGNOSIS — D1800 Hemangioma unspecified site: Secondary | ICD-10-CM | POA: Diagnosis not present

## 2021-10-30 DIAGNOSIS — L820 Inflamed seborrheic keratosis: Secondary | ICD-10-CM | POA: Diagnosis not present

## 2021-10-30 DIAGNOSIS — L853 Xerosis cutis: Secondary | ICD-10-CM | POA: Diagnosis not present

## 2021-10-30 DIAGNOSIS — R21 Rash and other nonspecific skin eruption: Secondary | ICD-10-CM

## 2021-10-30 DIAGNOSIS — D229 Melanocytic nevi, unspecified: Secondary | ICD-10-CM

## 2021-10-30 DIAGNOSIS — L719 Rosacea, unspecified: Secondary | ICD-10-CM

## 2021-10-30 DIAGNOSIS — T881XXA Other complications following immunization, not elsewhere classified, initial encounter: Secondary | ICD-10-CM

## 2021-10-30 DIAGNOSIS — R238 Other skin changes: Secondary | ICD-10-CM

## 2021-10-30 MED ORDER — METRONIDAZOLE 0.75 % EX CREA: TOPICAL_CREAM | CUTANEOUS | 5 refills | Status: DC

## 2021-10-30 NOTE — Progress Notes (Signed)
Follow-Up Visit   Subjective  Meghan Mack is a 71 y.o. female who presents for the following: Annual Exam.  Patient presents for TBSE. She received a Covid booster on Friday of last week. She developed a rash on that arm a couple of days later. Rash is fading some now, but still feels prickly and itchy at times. She also has a rough spot on her left foot she would like checked. She has a few itchy, irritated spots on her back, neck, and leg. History of AKs treated in the past.   The following portions of the chart were reviewed this encounter and updated as appropriate:       Review of Systems:  No other skin or systemic complaints except as noted in HPI or Assessment and Plan.  Objective  Well appearing patient in no apparent distress; mood and affect are within normal limits.  A full examination was performed including scalp, head, eyes, ears, nose, lips, neck, chest, axillae, abdomen, back, buttocks, bilateral upper extremities, bilateral lower extremities, hands, feet, fingers, toes, fingernails, and toenails. All findings within normal limits unless otherwise noted below.  Right Lower Lip Blue papule  Right Upper Arm Pink edematous plaques.   L mid and lower back x 5, R lower neck x 1, L pretibia x 1, L inf knee x 1, L foot dorsum x 1 (9) Erythematous stuck-on, waxy papule  face Mild erythema with telangiectasia of the nose, cheeks, chin    Assessment & Plan  Skin cancer screening performed today.  Actinic Damage - chronic, secondary to cumulative UV radiation exposure/sun exposure over time - diffuse scaly erythematous macules with underlying dyspigmentation - Recommend daily broad spectrum sunscreen SPF 30+ to sun-exposed areas, reapply every 2 hours as needed.  - Recommend staying in the shade or wearing long sleeves, sun glasses (UVA+UVB protection) and wide brim hats (4-inch brim around the entire circumference of the hat). - Call for new or changing  lesions.  Lentigines - Scattered tan macules - Due to sun exposure - Benign-appering, observe - Recommend daily broad spectrum sunscreen SPF 30+ to sun-exposed areas, reapply every 2 hours as needed. - Call for any changes  Seborrheic Keratoses - Stuck-on, waxy, tan-brown papules and/or plaques  - Benign-appearing - Discussed benign etiology and prognosis. - Observe - Call for any changes  Hemangiomas - Red papules - Discussed benign nature - Observe - Call for any changes  Varicose Veins/Spider Veins - Dilated blue, purple or red veins at the lower extremities - Reassured - Smaller vessels can be treated by sclerotherapy (a procedure to inject a medicine into the veins to make them disappear) if desired, but the treatment is not covered by insurance. Larger vessels may be covered if symptomatic and we would refer to vascular surgeon if treatment desired.  Xerosis - diffuse xerotic patches; follicular prominence of the right upper thigh - recommend gentle, hydrating skin care - gentle skin care handout given - may use TMC 0.1% Cream prn itch.   Venous lake Right Lower Lip  Benign, observe.    Rash Right Upper Arm  Secondary to recent Covid vaccine.   Start TMC 0.1% Cream BID until rash improved. Avoid face, groin, axilla. Pt has at home.   Topical steroids (such as triamcinolone, fluocinolone, fluocinonide, mometasone, clobetasol, halobetasol, betamethasone, hydrocortisone) can cause thinning and lightening of the skin if they are used for too long in the same area. Your physician has selected the right strength medicine for your problem  and area affected on the body. Please use your medication only as directed by your physician to prevent side effects.    Inflamed seborrheic keratosis (9) L mid and lower back x 5, R lower neck x 1, L pretibia x 1, L inf knee x 1, L foot dorsum x 1  Recheck left pretibia on follow-up. Consider bx if not improved  Cont TMC 0.1%  Cream Apply BID to AA back prn itch. Avoid face, groin, axilla.  Topical steroids (such as triamcinolone, fluocinolone, fluocinonide, mometasone, clobetasol, halobetasol, betamethasone, hydrocortisone) can cause thinning and lightening of the skin if they are used for too long in the same area. Your physician has selected the right strength medicine for your problem and area affected on the body. Please use your medication only as directed by your physician to prevent side effects.    Destruction of lesion - L mid and lower back x 5, R lower neck x 1, L pretibia x 1, L inf knee x 1, L foot dorsum x 1  Destruction method: cryotherapy   Informed consent: discussed and consent obtained   Lesion destroyed using liquid nitrogen: Yes   Region frozen until ice ball extended beyond lesion: Yes   Outcome: patient tolerated procedure well with no complications   Post-procedure details: wound care instructions given   Additional details:  Prior to procedure, discussed risks of blister formation, small wound, skin dyspigmentation, or rare scar following cryotherapy. Recommend Vaseline ointment to treated areas while healing.   Related Medications triamcinolone cream (KENALOG) 0.1 % Apply to affected areas on back/body 1-2 times a day as needed for itch. Avoid face, groin, underarms.  Rosacea face  Rosacea is a chronic progressive skin condition usually affecting the face of adults, causing redness and/or acne bumps. It is treatable but not curable. It sometimes affects the eyes (ocular rosacea) as well. It may respond to topical and/or systemic medication and can flare with stress, sun exposure, alcohol, exercise and some foods.  Daily application of broad spectrum spf 30+ sunscreen to face is recommended to reduce flares.  Start metronidazole 0.75% cream Apply qd/bid face for rosacea dsp 45g 5Rf.  metroNIDAZOLE (METROCREAM) 0.75 % cream - face Apply to face once to twice daily for  rosacea.   Return in about 1 year (around 10/30/2022) for TBSE.  Documentation: I have reviewed the above documentation for accuracy and completeness, and I agree with the above.  Brendolyn Patty MD

## 2021-10-30 NOTE — Patient Instructions (Addendum)
Triamcinlone 0.1% Cream - Apply to affected areas rash on right upper arm once to twice daily until improved. May also use 1-2 times a day to other itchy areas until improved. Avoid face, groin, underarms.  Topical steroids (such as triamcinolone, fluocinolone, fluocinonide, mometasone, clobetasol, halobetasol, betamethasone, hydrocortisone) can cause thinning and lightening of the skin if they are used for too long in the same area. Your physician has selected the right strength medicine for your problem and area affected on the body. Please use your medication only as directed by your physician to prevent side effects.    Cryotherapy Aftercare  Wash gently with soap and water everyday.   Apply Vaseline and Band-Aid daily until healed.    Start metronidazole 0.75% cream Apply to face once to twice daily for Rosacea. Prescription on hold at pharmacy.   Rosacea  What is rosacea? Rosacea (say: ro-zay-sha) is a common skin disease that usually begins as a trend of flushing or blushing easily.  As rosacea progresses, a persistent redness in the center of the face will develop and may gradually spread beyond the nose and cheeks to the forehead and chin.  In some cases, the ears, chest, and back could be affected.  Rosacea may appear as tiny blood vessels or small red bumps that occur in crops.  Frequently they can contain pus, and are called pustules.  If the bumps do not contain pus, they are referred to as papules.  Rarely, in prolonged, untreated cases of rosacea, the oil glands of the nose and cheeks may become permanently enlarged.  This is called rhinophyma, and is seen more frequently in men.  Signs and Risks In its beginning stages, rosacea tends to come and go, which makes it difficult to recognize.  It can start as intermittent flushing of the face.  Eventually, blood vessels may become permanently visible.  Pustules and papules can appear, but can be mistaken for adult acne.  People of  all races, ages, genders and ethnic groups are at risk of developing rosacea.  However, it is more common in women (especially around menopause) and adults with fair skin between the ages of 1 and 26.  Treatment Dermatologists typically recommend a combination of treatments to effectively manage rosacea.  Treatment can improve symptoms and may stop the progression of the rosacea.  Treatment may involve both topical and oral medications.  The tetracycline antibiotics are often used for their anti-inflammatory effect; however, because of the possibility of developing antibiotic resistance, they should not be used long term at full dose.  For dilated blood vessels the options include electrodessication (uses electric current through a small needle), laser treatment, and cosmetics to hide the redness.   With all forms of treatment, improvement is a slow process, and patients may not see any results for the first 3-4 weeks.  It is very important to avoid the sun and other triggers.  Patients must wear sunscreen daily.  Skin Care Instructions: Cleanse the skin with a mild soap such as CeraVe cleanser, Cetaphil cleanser, or Dove soap once or twice daily as needed. Moisturize with Eucerin Redness Relief Daily Perfecting Lotion (has a subtle green tint), CeraVe Moisturizing Cream, or Oil of Olay Daily Moisturizer with sunscreen every morning and/or night as recommended. Makeup should be non-comedogenic (wont clog pores) and be labeled for sensitive skin. Good choices for cosmetics are: Neutrogena, Almay, and Physicians Formula.  Any product with a green tint tends to offset a red complexion. If your eyes are dry  and irritated, use artificial tears 2-3 times per day and cleanse the eyelids daily with baby shampoo.  Have your eyes examined at least every 2 years.  Be sure to tell your eye doctor that you have rosacea. Alcoholic beverages tend to cause flushing of the skin, and may make rosacea worse. Always  wear sunscreen, protect your skin from extreme hot and cold temperatures, and avoid spicy foods, hot drinks, and mechanical irritation such as rubbing, scrubbing, or massaging the face.  Avoid harsh skin cleansers, cleansing masks, astringents, and exfoliation. If a particular product burns or makes your face feel tight, then it is likely to flare your rosacea. If you are having difficulty finding a sunscreen that you can tolerate, you may try switching to a chemical-free sunscreen.  These are ones whose active ingredient is zinc oxide or titanium dioxide only.  They should also be fragrance free, non-comedogenic, and labeled for sensitive skin. Rosacea triggers may vary from person to person.  There are a variety of foods that have been reported to trigger rosacea.  Some patients find that keeping a diary of what they were doing when they flared helps them avoid triggers.   Gentle Skin Care Guide  1. Bathe no more than once a day.  2. Avoid bathing in hot water  3. Use a mild soap like Dove, Vanicream, Cetaphil, CeraVe. Can use Lever 2000 or Cetaphil antibacterial soap  4. Use soap only where you need it. On most days, use it under your arms, between your legs, and on your feet. Let the water rinse other areas unless visibly dirty.  5. When you get out of the bath/shower, use a towel to gently blot your skin dry, don't rub it.  6. While your skin is still a little damp, apply a moisturizing cream such as Vanicream, CeraVe, Cetaphil, Eucerin, Sarna lotion or plain Vaseline Jelly. For hands apply Neutrogena [place] Hand Cream or Excipial Hand Cream.  7. Reapply moisturizer any time you start to itch or feel dry.  8. Sometimes using free and clear laundry detergents can be helpful. Fabric softener sheets should be avoided. Downy Free & Gentle liquid, or any liquid fabric softener that is free of dyes and perfumes, it acceptable to use  9. If your doctor has given you prescription creams you  may apply moisturizers over them     If You Need Anything After Your Visit  If you have any questions or concerns for your doctor, please call our main line at [phone] and press option 4 to reach your doctor's medical assistant. If no one answers, please leave a voicemail as directed and we will return your call as soon as possible. Messages left after 4 pm will be answered the following business day.   You may also send [place] a message via [place]. We typically respond to MyChart messages within 1-2 business days.  For prescription refills, please ask your pharmacy to contact our office. Our fax number is [phone].  If you have an urgent issue when the clinic is closed that cannot wait until the next business day, you can page your doctor at the number below.    Please note that while we do our best to be available for urgent issues outside of office hours, we are not available 24/7.   If you have an urgent issue and are unable to reach [place], you may choose to seek medical care at your doctor's office, retail clinic, urgent care center, or emergency room.  If you have a medical emergency, please immediately call 911 or go to the emergency department.  Pager Numbers  - Dr. Nehemiah Massed: [phone]  - Dr. Laurence Ferrari: [phone]  - Dr. Nicole Kindred: [phone]  In the event of inclement weather, please call our main line at [phone] for an update on the status of any delays or closures.  Dermatology Medication Tips: Please keep the boxes that topical medications come in in order to help keep track of the instructions about where and how to use these. Pharmacies typically print the medication instructions only on the boxes and not directly on the medication tubes.   If your medication is too expensive, please contact our office at [phone] option 4 or send [place] a message through [place].   We are unable to tell what your co-pay for medications will be in advance as this is different  depending on your insurance coverage. However, we may be able to find a substitute medication at lower cost or fill out paperwork to get insurance to cover a needed medication.   If a prior authorization is required to get your medication covered by your insurance company, please allow [place] 1-2 business days to complete this process.  Drug prices often vary depending on where the prescription is filled and some pharmacies may offer cheaper prices.  The website www.goodrx.com contains coupons for medications through different pharmacies. The prices here do not account for what the cost may be with help from insurance (it may be cheaper with your insurance), but the website can give you the price if you did not use any insurance.  - You can print the associated coupon and take it with your prescription to the pharmacy.  - You may also stop by our office during regular business hours and pick up a GoodRx coupon card.  - If you need your prescription sent electronically to a different pharmacy, notify our office through [facility] or by phone at [phone] option 4.     Si Usted Necesita Algo Despus de Su Visita  Tambin puede enviarnos un mensaje a travs de Pharmacist, community. Por lo general respondemos a los mensajes de MyChart en el transcurso de 1 a 2 das hbiles.  Para renovar recetas, por favor pida a su farmacia que se ponga en contacto con nuestra oficina. Harland Dingwall de fax es [place] [phone].  Si tiene un asunto urgente cuando la clnica est cerrada y que no puede esperar hasta el siguiente da hbil, puede llamar/localizar a su doctor(a) al nmero que aparece a continuacin.   Por favor, tenga en cuenta que aunque hacemos todo lo posible para estar disponibles para asuntos urgentes fuera del horario de [place], no estamos disponibles las 24 horas del da, los 7 das de la [place].   Si tiene un problema urgente y no puede comunicarse con nosotros, puede optar por buscar atencin  mdica  en el consultorio de su doctor(a), en una clnica privada, en un centro de atencin urgente o en una sala de emergencias.  Si tiene Engineering geologist, por favor llame inmediatamente al 911 o vaya a la sala de emergencias.  Nmeros de bper  - Dr. Nehemiah Massed: [phone]  - Dra. Moye: [phone]  - Dra. Nicole Kindred: [phone]  En caso de inclemencias del [place], por favor llame a Johnsie Kindred principal al [phone] para una actualizacin sobre el [place] de cualquier retraso o cierre.  Consejos para la medicacin en dermatologa: Por favor, guarde las cajas en las que vienen los medicamentos de  uso tpico para ayudarle a seguir las [employer] dnde y cmo usarlos. Las farmacias generalmente imprimen las instrucciones del medicamento slo en las cajas y no directamente en los tubos del [place].   Si su medicamento es muy caro, por favor, pngase en contacto con Zigmund Daniel llamando al [phone] y presione la opcin 4 o envenos un mensaje a travs de Pharmacist, community.   No podemos decirle cul ser su copago por los medicamentos por adelantado ya que esto es diferente dependiendo de la cobertura de su seguro. Sin embargo, es posible que podamos encontrar un medicamento sustituto a Electrical engineer un formulario para que el seguro cubra el medicamento que se considera necesario.   Si se requiere una autorizacin previa para que su compaa de seguros [place] su medicamento, por favor permtanos de 1 a 2 das hbiles para completar este proceso.  Los precios de los medicamentos varan con frecuencia dependiendo del Environmental consultant de dnde se surte la receta y alguna farmacias pueden ofrecer precios ms baratos.  El sitio web www.goodrx.com tiene cupones para medicamentos de Airline pilot. Los precios aqu no tienen en cuenta lo que podra costar con la ayuda del seguro (puede ser ms barato con su seguro), pero el sitio web puede darle el precio si no utiliz Conservation officer, historic buildings.  - Puede imprimir el cupn correspondiente y llevarlo con su receta a la farmacia.  - Tambin puede pasar por nuestra oficina durante el horario de atencin regular y Charity fundraiser una tarjeta de cupones de GoodRx.  - Si necesita que su receta se enve electrnicamente a una farmacia diferente, informe a nuestra oficina a travs de MyChart de [place] o por telfono llamando al [phone] y presione la opcin 4.

## 2021-11-27 DIAGNOSIS — E559 Vitamin D deficiency, unspecified: Secondary | ICD-10-CM | POA: Diagnosis not present

## 2021-11-27 DIAGNOSIS — E890 Postprocedural hypothyroidism: Secondary | ICD-10-CM | POA: Diagnosis not present

## 2021-11-27 DIAGNOSIS — Z833 Family history of diabetes mellitus: Secondary | ICD-10-CM | POA: Diagnosis not present

## 2021-11-27 DIAGNOSIS — M8580 Other specified disorders of bone density and structure, unspecified site: Secondary | ICD-10-CM | POA: Diagnosis not present

## 2021-12-04 DIAGNOSIS — M8580 Other specified disorders of bone density and structure, unspecified site: Secondary | ICD-10-CM | POA: Diagnosis not present

## 2021-12-04 DIAGNOSIS — E559 Vitamin D deficiency, unspecified: Secondary | ICD-10-CM | POA: Diagnosis not present

## 2021-12-04 DIAGNOSIS — E890 Postprocedural hypothyroidism: Secondary | ICD-10-CM | POA: Diagnosis not present

## 2021-12-05 DIAGNOSIS — Z03818 Encounter for observation for suspected exposure to other biological agents ruled out: Secondary | ICD-10-CM | POA: Diagnosis not present

## 2021-12-05 DIAGNOSIS — Z20822 Contact with and (suspected) exposure to covid-19: Secondary | ICD-10-CM | POA: Diagnosis not present

## 2021-12-18 ENCOUNTER — Telehealth: Payer: Self-pay

## 2021-12-18 NOTE — Telephone Encounter (Signed)
Please call to discuss PA needed for Bystolic- Fax [phone] Attn: [employer] OPS

## 2021-12-19 NOTE — Telephone Encounter (Signed)
Per fax received from [facility], Brand Bystolic 5 mg tablet, use as directed, is approved for non-formulary exception through 10/14/2022 under patient's Medicare part D benefit.  ?Brand Bystolic 5 mg tablet (use as directed: 2 tablets per day) is denied for not meeting the quantity limit requirement. Quantities at or below the plan's limit 1 tablet per day will continue to process at the pharmacy. Coverage for the requested quantity requires the following: 1. The higher dose or quantity is supported in the dosage and administration section of the manufacturer's prescribing info -OR- 2. The higher dose or quantity is supported by one of the following Medicare approved compendia: a.) [facility] Formulary Service Drug Information or b.) Micromedex [place] ?

## 2021-12-19 NOTE — Telephone Encounter (Signed)
[facility] is reviewing your PA request. Typically an electronic response will be received within 24-72 hours. To check for an update later, open this request from your dashboard.    You may close this dialog and return to your dashboard to perform other tasks.

## 2021-12-19 NOTE — Telephone Encounter (Signed)
PA has been initiated through covermymeds for Bystolic 5 mg tablet. ?Awaiting approval. ?

## 2022-01-09 DIAGNOSIS — L570 Actinic keratosis: Secondary | ICD-10-CM | POA: Diagnosis not present

## 2022-01-22 DIAGNOSIS — H40003 Preglaucoma, unspecified, bilateral: Secondary | ICD-10-CM | POA: Diagnosis not present

## 2022-02-20 DIAGNOSIS — G43109 Migraine with aura, not intractable, without status migrainosus: Secondary | ICD-10-CM | POA: Diagnosis not present

## 2022-02-20 DIAGNOSIS — H43813 Vitreous degeneration, bilateral: Secondary | ICD-10-CM | POA: Diagnosis not present

## 2022-02-26 ENCOUNTER — Ambulatory Visit (INDEPENDENT_AMBULATORY_CARE_PROVIDER_SITE_OTHER): Payer: Medicare Other | Admitting: Dermatology

## 2022-02-26 DIAGNOSIS — L304 Erythema intertrigo: Secondary | ICD-10-CM

## 2022-02-26 DIAGNOSIS — L820 Inflamed seborrheic keratosis: Secondary | ICD-10-CM | POA: Diagnosis not present

## 2022-02-26 MED ORDER — TRIAMCINOLONE ACETONIDE 0.025 % EX CREA: 1.0000 "application " | TOPICAL_CREAM | CUTANEOUS | 2 refills | Status: DC

## 2022-02-26 MED ORDER — KETOCONAZOLE 2 % EX CREA: 1.0000 "application " | TOPICAL_CREAM | Freq: Two times a day (BID) | CUTANEOUS | 2 refills | Status: AC

## 2022-02-26 NOTE — Patient Instructions (Addendum)
Intertrigo ?Intertrigo is a chronic recurrent rash that occurs in skin fold areas that may be associated with friction; heat; moisture; yeast; fungus; and bacteria.  It is exacerbated by increased movement / activity; sweating; and higher atmospheric temperature.  ? ?Start Ketoconazole 2% cr bid until clear, then prn flares, may use daily as preventative. ?If pt gets itching, may start Triamcinolone 0.025% cream one to two times daily up to 1 week affected area buttocks ? ? ? ? ?If You Need Anything After Your Visit ? ?If you have any questions or concerns for your doctor, please call our main line at [phone] and press option 4 to reach your doctor's medical assistant. If no one answers, please leave a voicemail as directed and we will return your call as soon as possible. Messages left after 4 pm will be answered the following business day.  ? ?You may also send [place] a message via MyChart. We typically respond to MyChart messages within 1-2 business days. ? ?For prescription refills, please ask your pharmacy to contact our office. Our fax number is [phone]. ? ?If you have an urgent issue when the clinic is closed that cannot wait until the next business day, you can page your doctor at the number below.   ? ?Please note that while we do our best to be available for urgent issues outside of office hours, we are not available 24/7.  ? ?If you have an urgent issue and are unable to reach [place], you may choose to seek medical care at your doctor's office, retail clinic, urgent care center, or emergency room. ? ?If you have a medical emergency, please immediately call 911 or go to the emergency department. ? ?Pager Numbers ? ?- Dr. Nehemiah Massed: [phone] ? ?- Dr. Laurence Ferrari: [phone] ? ?- Dr. Nicole Kindred: [phone] ? ?In the event of inclement weather, please call our main line at [phone] for an update on the status of any delays or closures. ? ?Dermatology Medication Tips: ?Please keep the boxes that topical  medications come in in order to help keep track of the instructions about where and how to use these. Pharmacies typically print the medication instructions only on the boxes and not directly on the medication tubes.  ? ?If your medication is too expensive, please contact our office at [phone] option 4 or send [place] a message through [place].  ? ?We are unable to tell what your co-pay for medications will be in advance as this is different depending on your insurance coverage. However, we may be able to find a substitute medication at lower cost or fill out paperwork to get insurance to cover a needed medication.  ? ?If a prior authorization is required to get your medication covered by your insurance company, please allow [place] 1-2 business days to complete this process. ? ?Drug prices often vary depending on where the prescription is filled and some pharmacies may offer cheaper prices. ? ?The website www.goodrx.com contains coupons for medications through different pharmacies. The prices here do not account for what the cost may be with help from insurance (it may be cheaper with your insurance), but the website can give you the price if you did not use any insurance.  ?- You can print the associated coupon and take it with your prescription to the pharmacy.  ?- You may also stop by our office during regular business hours and pick up a GoodRx coupon card.  ?- If you need your prescription sent electronically to a different pharmacy, notify  our office through [facility] or by phone at [phone] option 4. ? ? ? ? ?Si Usted Necesita Algo Despu?s de Su Visita ? ?Tambi?n puede enviarnos un mensaje a trav?s de MyChart. Por lo general respondemos a los mensajes de MyChart en el transcurso de 1 a 2 d?as h?biles. ? ?Para renovar recetas, por favor pida a su farmacia que se ponga en contacto con nuestra oficina. Nuestro n?mero de fax es el [phone]. ? ?Si tiene un asunto urgente cuando la cl?nica est?  cerrada y que no puede esperar hasta el siguiente d?a h?bil, puede llamar/localizar a su doctor(a) al n?mero que aparece a continuaci?n.  ? ?Por favor, tenga en cuenta que aunque hacemos todo lo posible para estar disponibles para asuntos urgentes fuera del horario de oficina, no estamos disponibles las 24 horas del d?a, los 7 d?as de la semana.  ? ?Si tiene un problema urgente y no puede comunicarse con nosotros, puede optar por buscar atenci?n m?dica  en el consultorio de su doctor(a), en una cl?nica privada, en un centro de atenci?n urgente o en una sala de emergencias. ? ?Si tiene Engineer, maintenance (IT) m?dica, por favor llame inmediatamente al 911 o vaya a la sala de emergencias. ? ?N?meros de b?per ? ?- Dr. Nehemiah Massed: [phone] ? ?- Dra. Moye: [phone] ? ?- Dra. Nicole Kindred: [phone] ? ?En caso de inclemencias del tiempo, por favor llame a nuestra l?nea principal al [phone] para una actualizaci?n sobre el estado de cualquier retraso o cierre. ? ?Consejos para la medicaci?n en dermatolog?a: ?Por favor, guarde las cajas en las que vienen los medicamentos de uso t?pico para ayudarle a seguir las instrucciones sobre d?nde y c?mo usarlos. Las farmacias generalmente imprimen las instrucciones del medicamento s?lo en las cajas y no directamente en los tubos del [place].  ? ?Si su medicamento es muy caro, por favor, p?ngase en contacto con Zigmund Daniel llamando al [phone] y presione la opci?n 4 o env?enos un mensaje a trav?s de MyChart.  ? ?No podemos decirle cu?l ser? su copago por los medicamentos por adelantado ya que esto es diferente dependiendo de la cobertura de su seguro. Sin embargo, es posible que podamos encontrar un medicamento sustituto a Electrical engineer un formulario para que el seguro cubra el medicamento que se considera necesario.  ? ?Si se requiere Ardelia Mems autorizaci?n previa para que su compa??a de seguros [place] su medicamento, por favor perm?tanos de 1 a 2 d?as h?biles para  completar este proceso. ? ?Los precios de los medicamentos var?an con frecuencia dependiendo del Environmental consultant de d?nde se surte la receta y alguna farmacias pueden ofrecer precios m?s baratos. ? ?El sitio web www.goodrx.com tiene cupones para medicamentos de Airline pilot. Los precios aqu? no tienen en cuenta lo que podr?a costar con la ayuda del seguro (puede ser m?s barato con su seguro), pero el sitio web puede darle el precio si no utiliz? ning?n seguro.  ?- Puede imprimir el cup?n correspondiente y llevarlo con su receta a la farmacia.  ?- Tambi?n puede pasar por nuestra oficina durante el horario de atenci?n regular y recoger una tarjeta de cupones de GoodRx.  ?- Si necesita que su receta se env?e electr?nicamente a Chiropodist, informe a nuestra oficina a trav?s de MyChart de [place] o por tel?fono llamando al [phone] y presione la opci?n 4.  ?

## 2022-02-26 NOTE — Progress Notes (Signed)
? ?  Follow-Up Visit ?  ?Subjective  ?Meghan Mack is a 71 y.o. female who presents for the following: non healing wound (Buttocks, 6 days, no treatment).  She also has some pink itchy bumps on chest.  Uses TMC cream which helps, needs rf. ? ? ?The following portions of the chart were reviewed this encounter and updated as appropriate:  ?  ?  ? ?Review of Systems:  No other skin or systemic complaints except as noted in HPI or Assessment and Plan. ? ?Objective  ?Well appearing patient in no apparent distress; mood and affect are within normal limits. ? ?A focused examination was performed including buttocks. Relevant physical exam findings are noted in the Assessment and Plan. ? ?L upper chest ?Stuck on waxy paps with erythema ? 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 ? ? ?Assessment & Plan  ?Inflamed seborrheic keratosis ?L upper chest ? ?Reassured benign age-related growth.  Recommend observation.  Discussed cryotherapy if spot(s) become irritated or inflamed. Pt defers. ? ?Start TMC 0.025% cr qd/bid until clear, avoid f/g/a, Caution skin atrophy with long-term use.  ? ?Topical steroids (such as triamcinolone, fluocinolone, fluocinonide, mometasone, clobetasol, halobetasol, betamethasone, hydrocortisone) can cause thinning and lightening of the skin if they are used for too long in the same area. Your physician has selected the right strength medicine for your problem and area affected on the body. Please use your medication only as directed by your physician to prevent side effects.   ? ?triamcinolone (KENALOG) 0.025 % cream - L upper chest ?Apply 1 application. topically as directed. Qd to bid aa chest, shoulders until clear then prn flares, avoid face, groin, axilla ? 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 Condition is bothersome/symptomatic for patient. Currently flared.  ? ?Intertrigo is a chronic recurrent rash that  occurs in skin fold areas that may be associated with friction; heat; moisture; yeast; fungus; and bacteria.  It is exacerbated by increased movement / activity; sweating; and higher atmospheric temperature. ? ?Start Ketoconazole 2% cr bid until clear, then prn flares, may use qd for preventative  ?If pt gets itching, may start TMC 0.025% cr qd/bid up to 1 week aa buttocks ? ?ketoconazole (NIZORAL) 2 % cream - Gluteal Cleft ?Apply 1 application. topically 2 (two) times daily. Bid aa rash buttocks until clear, then prn flares ? ? ?Return for as scheduled. ? ?I, Othelia Pulling, RMA, am acting as scribe for Brendolyn Patty, MD . ? ?Documentation: I have reviewed the above documentation for accuracy and completeness, and I agree with the above. ? ?Brendolyn Patty MD  ? ?

## 2022-03-07 ENCOUNTER — Telehealth: Payer: Self-pay | Admitting: Family

## 2022-03-07 NOTE — Telephone Encounter (Signed)
Copied from [place] [id number]. Topic: Medicare AWV >> Mar 07, 2022  1:26 PM Harris-Coley, Hannah Beat wrote: Reason for CRM: Left message for patient to schedule Annual Wellness Visit.  Please schedule with Nurse Health Advisor Denisa O'Brien-Blaney, LPN at [facility].  Please call [phone] ask for [facility]

## 2022-03-08 DIAGNOSIS — E559 Vitamin D deficiency, unspecified: Secondary | ICD-10-CM | POA: Diagnosis not present

## 2022-03-08 DIAGNOSIS — J309 Allergic rhinitis, unspecified: Secondary | ICD-10-CM | POA: Diagnosis not present

## 2022-03-08 DIAGNOSIS — Z1211 Encounter for screening for malignant neoplasm of colon: Secondary | ICD-10-CM | POA: Diagnosis not present

## 2022-03-08 DIAGNOSIS — R030 Elevated blood-pressure reading, without diagnosis of hypertension: Secondary | ICD-10-CM | POA: Diagnosis not present

## 2022-03-08 DIAGNOSIS — M8580 Other specified disorders of bone density and structure, unspecified site: Secondary | ICD-10-CM | POA: Diagnosis not present

## 2022-03-08 DIAGNOSIS — J358 Other chronic diseases of tonsils and adenoids: Secondary | ICD-10-CM | POA: Diagnosis not present

## 2022-03-08 DIAGNOSIS — Z1239 Encounter for other screening for malignant neoplasm of breast: Secondary | ICD-10-CM | POA: Diagnosis not present

## 2022-03-13 ENCOUNTER — Other Ambulatory Visit: Payer: Self-pay | Admitting: Obstetrics and Gynecology

## 2022-03-13 DIAGNOSIS — M8580 Other specified disorders of bone density and structure, unspecified site: Secondary | ICD-10-CM

## 2022-03-23 ENCOUNTER — Ambulatory Visit (INDEPENDENT_AMBULATORY_CARE_PROVIDER_SITE_OTHER): Payer: Medicare Other

## 2022-03-23 VITALS — Ht 67.5 in | Wt 149.0 lb

## 2022-03-23 DIAGNOSIS — Z0000 Encounter for general adult medical examination without abnormal findings: Secondary | ICD-10-CM

## 2022-03-23 NOTE — Progress Notes (Signed)
Subjective:   Meghan Mack is a 71 y.o. female who presents for Medicare Annual (Subsequent) preventive examination.  Review of Systems    No ROS.  Medicare Wellness Virtual Visit.  Visual/audio telehealth visit, UTA vital signs.   See social history for additional risk factors.   Cardiac Risk Factors include: advanced age (>75mn, >>34women)     Objective:    Today's Vitals   03/23/22 1117  Weight: 149 lb (67.6 kg)  Height: 5' 7.5" (1.715 m)   Body mass index is 22.99 kg/m.     03/23/2022   11:25 AM 07/10/2021    8:58 AM 03/08/2021    9:52 AM 01/23/2021    9:36 AM 03/07/2020    9:42 AM 12/29/2019    9:45 AM 07/01/2019    9:18 AM  Advanced Directives  Does Patient Have a Medical Advance Directive? No Yes Yes Yes Yes Yes No  Type of ACorporate treasurerof [place] will [place] will  [place] will    Does patient want to make changes to medical advance directive?   No - Patient declined  No - Patient declined    Copy of HH[place]in Chart?   No - copy requested  No - copy requested    Would patient like information on creating a medical advance directive? No - Patient declined          Current Medications (verified) Outpatient Encounter Medications as of 03/23/2022  Medication Sig   ergocalciferol (VITAMIN D2) 50000 UNITS capsule Take 50,000 Units by mouth every 30 (thirty) days.   ibuprofen (ADVIL,MOTRIN) 600 MG tablet Take 1 tablet (600 mg total) by mouth every 8 (eight) hours as needed.   ketoconazole (NIZORAL) 2 % cream Apply 1 application. topically 2 (two) times daily. Bid aa rash buttocks until clear, then prn flares   latanoprost (XALATAN) 0.005 % ophthalmic solution SMARTSIG:In Eye(s)   levothyroxine (SYNTHROID, LEVOTHROID) 88 MCG tablet Take 1 tablet 6 days a week.   metroNIDAZOLE (METROCREAM) 0.75 % cream Apply to face once to twice daily for rosacea.   Multiple  Vitamin (MULTIVITAMIN PO) Take 1 tablet by mouth daily.   nebivolol (BYSTOLIC) 5 MG tablet Take 1 tablet (5 mg total) by mouth daily. May take extra 5 mg as needed for tachycardia   simvastatin (ZOCOR) 20 MG tablet TAKE 1 TABLET BY MOUTH EVERY DAY IN THE EVENING   triamcinolone (KENALOG) 0.025 % cream Apply 1 application. topically as directed. Qd to bid aa chest, shoulders until clear then prn flares, avoid face, groin, axilla   No facility-administered encounter medications on file as of 03/23/2022.    Allergies (verified) Diflucan [fluconazole], Tdap [tetanus-diphth-acell pertussis], Codeine, Propantheline bromide, Sulfa antibiotics, Sulfonamide derivatives, Esomeprazole magnesium, Gadolinium derivatives, Meperidine hcl, Pneumovax 23 [pneumococcal vac polyvalent], and Propoxyphene n-acetaminophen   History: Past Medical History:  Diagnosis Date   Anemia    Colonic polyp    Constipation    COPD (chronic obstructive pulmonary disease) (HCC)    Hyperlipidemia    IBS (irritable bowel syndrome)    Kidney cysts    Lung nodule    Meningioma (HCC)    Nausea    Palpitations    Tonsillar cyst    Past Surgical History:  Procedure Laterality Date   CARDIAC CATHETERIZATION  02/25/2007   COLONOSCOPY     ESOPHAGOGASTRODUODENOSCOPY  10/1997 & 08/2005   FOOT SURGERY  04/25/2007   right  THYROIDECTOMY  11/2005   VAGINAL HYSTERECTOMY     ovaries intact   Family History  Problem Relation Age of Onset   Cancer Mother        colon   Emphysema Father    Heart disease Father    Cancer Father        leukemia   Cancer Brother        lung   Cancer Maternal Aunt        melanoma   Cancer Maternal Grandfather        stomach, colon   Breast cancer Neg Hx    Kidney cancer Neg Hx    Kidney disease Neg Hx    Prostate cancer Neg Hx    Social History   Socioeconomic History   Marital status: Widowed    Spouse name: Not on file   Number of children: 2   Years of education: Not on file    Highest education level: Not on file  Occupational History   Occupation: Metallurgist: [employer]  Tobacco Use   Smoking status: Former    Packs/day: 0.25    Years: 10.00    Total pack years: 2.50    Types: Cigarettes    Quit date: 09/25/1997    Years since quitting: 24.5   Smokeless tobacco: Never  Vaping Use   Vaping Use: Never used  Substance and Sexual Activity   Alcohol use: No   Drug use: No   Sexual activity: Yes    Partners: Male    Birth control/protection: Surgical  Other Topics Concern   Not on file  Social History Narrative   Husband passed away 06/03/2021      has 2 children and 2 step-children. No pets.      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       Diet - regular   Exercise - walks 5 days per week   1.5 years of college      Caffeine - 2-3 glasses iced tea day; coffee 1 cup 1/2 decaf and 1/2 reg   Right handed. One level home.    Social Determinants of Health   Financial Resource Strain: Low Risk  (03/08/2021)   Overall Financial Resource Strain (CARDIA)    Difficulty of Paying Living Expenses: Not hard at all  Food Insecurity: No Food Insecurity (03/23/2022)   Hunger Vital Sign    Worried About Running Out of Food in the Last Year: Never true    Ran Out of Food in the Last Year: Never true  Transportation Needs: No Transportation Needs (03/23/2022)   PRAPARE - Hydrologist (Medical): No    Lack of Transportation (Non-Medical): No  Physical Activity: Insufficiently Active (03/23/2022)   Exercise Vital Sign    Days of Exercise per Week: 3 days    Minutes of Exercise per Session: 40 min  Stress: No Stress Concern Present (03/23/2022)   [place]    Feeling of Stress : Not at all  Social Connections: Unknown (03/08/2021)   Social Connection and Isolation Panel [NHANES]    Frequency of Communication with Friends and Family: More than  three times a week    Frequency of Social Gatherings with Friends and Family: Not on file    Attends Religious Services: Not on file    Active Member of Clubs or Organizations: Not on file  Attends Archivist Meetings: Not on file    Marital Status: Married    Tobacco Counseling Counseling given: Not Answered   Clinical Intake:  Pre-visit preparation completed: Yes        Diabetes: No  How often do you need to have someone help you when you read instructions, pamphlets, or other written materials from your doctor or pharmacy?: 1 - Never   Interpreter Needed?: No      Activities of Daily Living    03/23/2022   11:55 AM  In your present state of health, do you have any difficulty performing the following activities:  Hearing? 1  Comment Hearing aids  Vision? 0  Difficulty concentrating or making decisions? 0  Walking or climbing stairs? 0  Dressing or bathing? 0  Doing errands, shopping? 0  Preparing Food and eating ? N  Using the Toilet? N  In the past six months, have you accidently leaked urine? N  Do you have problems with loss of bowel control? N  Managing your Medications? N  Managing your Finances? N  Housekeeping or managing your Housekeeping? N    Patient Care Team: Burnard Hawthorne, FNP as PCP - General (Family Medicine) Rockey Situ Kathlene November, MD as Consulting Physician (Cardiology) Pieter Partridge, DO as Consulting Physician (Neurology)  Indicate any recent Medical Services you may have received from other than [facility] providers in the past year (date may be approximate).     Assessment:   This is a routine wellness examination for Nakenya.  Virtual Visit via Telephone Note  I connected with  Meghan Mack on 03/23/22 at 11:15 AM EDT by telephone and verified that I am speaking with the correct person using two identifiers.  Persons participating in the virtual visit: patient/Nurse Health Advisor   I discussed the limitations of  performing an evaluation and management service by telehealth. We continued and completed visit with audio only. Some vital signs may be absent or patient reported.   Hearing/Vision screen Hearing Screening - Comments:: Hearing aids, bilateral Vision Screening - Comments:: Followed by  Wears corrective lenses Cataract extraction, bilateral Visual acuity not assessed, virtual visit.  They have seen their ophthalmologist in the last 12 months.    Dietary issues and exercise activities discussed: Current Exercise Habits: Home exercise routine, Type of exercise: walking, Time (Minutes): 30, Frequency (Times/Week): 7, Weekly Exercise (Minutes/Week): 210, Intensity: Mild Healthy diet   Goals Addressed               This Visit's Progress     Patient Stated     Weight (lb) < 145 lb (65.8 kg) (pt-stated)   149 lb (67.6 kg)     Monitor carb intake; portion control       Depression Screen    03/23/2022   11:42 AM 07/14/2021    9:38 AM 03/08/2021    9:51 AM 06/17/2020   11:13 AM 03/07/2020    9:44 AM 11/13/2019   10:53 AM 08/12/2019   11:08 AM  PHQ 2/9 Scores  PHQ - 2 Score 0 1 0 0 0 0 0  PHQ- 9 Score  4  4       Fall Risk    03/23/2022   11:54 AM 07/10/2021    8:58 AM 03/08/2021    9:54 AM 01/23/2021    9:36 AM 03/07/2020    9:44 AM  Fall Risk   Falls in the past year? 1 0 0 1 0  Number falls in  past yr: 0 0 0 0   Injury with Fall? 0 0 0 0   Follow up Falls evaluation completed  Falls evaluation completed  Falls evaluation completed    FALL RISK PREVENTION PERTAINING TO THE HOME: Home free of loose throw rugs in walkways, pet beds, electrical cords, etc? Yes  Adequate lighting in your home to reduce risk of falls? Yes   ASSISTIVE DEVICES UTILIZED TO PREVENT FALLS: Life alert? No  Use of a cane, walker or w/c? No   TIMED UP AND GO: Was the test performed? No .   Cognitive Function: Patient is alert and oriented x3.     01/01/2018    8:38 AM 08/03/2015   12:03 PM   MMSE - Mini Mental State Exam  Not completed: Refused   Orientation to time  5  Orientation to Place  5  Registration  3  Attention/ Calculation  4  Recall  2  Language- name 2 objects  2  Language- repeat  1  Language- follow 3 step command  3  Language- read & follow direction  1  Write a sentence  1  Copy design  1  Total score  28      01/19/2016   11:00 AM  Montreal Cognitive Assessment   Visuospatial/ Executive (0/5) 3  Naming (0/3) 3  Attention: Read list of digits (0/2) 2  Attention: Read list of letters (0/1) 1  Attention: Serial 7 subtraction starting at 100 (0/3) 0  Language: Repeat phrase (0/2) 2  Language : Fluency (0/1) 1  Abstraction (0/2) 2  Delayed Recall (0/5) 3  Orientation (0/6) 6  Total 23  Adjusted Score (based on education) 23      03/07/2020   10:07 AM 03/05/2019    9:55 AM  6CIT Screen  What Year? 0 points 0 points  What month? 0 points 0 points  What time? 0 points 0 points  Count back from 20 0 points 0 points  Months in reverse 0 points 0 points  Repeat phrase 2 points 0 points  Total Score 2 points 0 points    Immunizations Immunization History  Administered Date(s) Administered   Fluad Quad(high Dose 65+) 07/14/2021   Influenza Split 07/15/2011, 06/26/2014   Influenza Whole 06/22/2009, 06/13/2010, 07/05/2012, 07/06/2018   Influenza, High Dose Seasonal PF 07/06/2018, 07/16/2019   Influenza,inj,quad, With Preservative 07/16/2019   Influenza-Unspecified 06/15/2013, 06/19/2015, 06/29/2016, 07/14/2017, 07/16/2019, 07/11/2020   Moderna Covid-19 Vaccine Bivalent Booster 69yr & up 10/27/2021   PFIZER(Purple Top)SARS-COV-2 Vaccination 11/23/2019, 12/18/2019, 08/01/2020   Pneumococcal Conjugate-13 06/27/2015   Pneumococcal Polysaccharide-23 08/11/2007   Td 07/17/2001   Tdap 09/03/2011   Zoster, Live 06/13/2010   TDAP status: Due, Education has been provided regarding the importance of this vaccine. Advised may receive this vaccine at  local pharmacy or Health Dept. Aware to provide a copy of the vaccination record if obtained from local pharmacy or Health Dept. Verbalized acceptance and understanding.  Pneumococcal vaccine status: Due, Education has been provided regarding the importance of this vaccine. Advised may receive this vaccine at local pharmacy or Health Dept. Aware to provide a copy of the vaccination record if obtained from local pharmacy or Health Dept. Verbalized acceptance and understanding.  Shingrix Completed?: No.    Education has been provided regarding the importance of this vaccine. Patient has been advised to call insurance company to determine out of pocket expense if they have not yet received this vaccine. Advised may also receive vaccine at local pharmacy  or Health Dept. Verbalized acceptance and understanding.  Screening Tests Health Maintenance  Topic Date Due   Pneumonia Vaccine 32+ Years old (3 - PPSV23 if available, else PCV20) 05/13/2022 (Originally 06/26/2016)   Zoster Vaccines- Shingrix (1 of 2) 06/23/2022 (Originally 02/24/1970)   TETANUS/TDAP  03/24/2023 (Originally 09/02/2021)   INFLUENZA VACCINE  05/15/2022   MAMMOGRAM  10/18/2023   COLONOSCOPY (Pts 45-58yr Insurance coverage will need to be confirmed)  07/20/2024   DEXA SCAN  Completed   COVID-19 Vaccine  Completed   Hepatitis C Screening  Completed   HPV VACCINES  Aged Out   Health Maintenance There are no preventive care reminders to display for this patient.  Lung Cancer Screening: (Low Dose CT Chest recommended if Age 72-80years, 30 pack-year currently smoking OR have quit w/in 15years.) does not qualify.   Vision Screening: Recommended annual ophthalmology exams for early detection of glaucoma and other disorders of the eye.  Dental Screening: Recommended annual dental exams for proper oral hygiene  Community Resource Referral / Chronic Care Management: CRR required this visit?  No   CCM required this visit?  No       Plan:   Keep all routine maintenance appointments.   I have personally reviewed and noted the following in the patient's chart:   Medical and social history Use of alcohol, tobacco or illicit drugs  Current medications and supplements including opioid prescriptions.  Functional ability and status Nutritional status Physical activity Advanced directives List of other physicians Hospitalizations, surgeries, and ER visits in previous 12 months Vitals Screenings to include cognitive, depression, and falls Referrals and appointments  In addition, I have reviewed and discussed with patient certain preventive protocols, quality metrics, and best practice recommendations. A written personalized care plan for preventive services as well as general preventive health recommendations were provided to patient.     OVarney Biles LPN   63/0/1314

## 2022-03-23 NOTE — Patient Instructions (Addendum)
  Ms. Goza , Thank you for taking time to come for your Medicare Wellness Visit. I appreciate your ongoing commitment to your health goals. Please review the following plan we discussed and let me know if I can assist you in the future.   These are the goals we discussed:  Goals       Patient Stated     Weight (lb) < 145 lb (65.8 kg) (pt-stated)      Monitor carb intake; portion control        This is a list of the screening recommended for you and due dates:  Health Maintenance  Topic Date Due   Pneumonia Vaccine (3 - PPSV23 if available, else PCV20) 05/13/2022*   Zoster (Shingles) Vaccine (1 of 2) 06/23/2022*   Tetanus Vaccine  03/24/2023*   Flu Shot  05/15/2022   Mammogram  10/18/2023   Colon Cancer Screening  07/20/2024   DEXA scan (bone density measurement)  Completed   COVID-19 Vaccine  Completed   Hepatitis C Screening: USPSTF Recommendation to screen - Ages 52-79 yo.  Completed   HPV Vaccine  Aged Out  *Topic was postponed. The date shown is not the original due date.

## 2022-04-23 ENCOUNTER — Telehealth: Payer: Self-pay | Admitting: Cardiovascular Disease

## 2022-04-23 NOTE — Telephone Encounter (Signed)
Patient wanted more information on the heart calcium scan and she would like to get this procedure.

## 2022-04-24 DIAGNOSIS — L570 Actinic keratosis: Secondary | ICD-10-CM | POA: Diagnosis not present

## 2022-04-24 DIAGNOSIS — L578 Other skin changes due to chronic exposure to nonionizing radiation: Secondary | ICD-10-CM | POA: Diagnosis not present

## 2022-04-24 DIAGNOSIS — Z872 Personal history of diseases of the skin and subcutaneous tissue: Secondary | ICD-10-CM | POA: Diagnosis not present

## 2022-04-24 DIAGNOSIS — Z86018 Personal history of other benign neoplasm: Secondary | ICD-10-CM | POA: Diagnosis not present

## 2022-04-25 DIAGNOSIS — I2510 Atherosclerotic heart disease of native coronary artery without angina pectoris: Secondary | ICD-10-CM

## 2022-04-25 NOTE — Telephone Encounter (Signed)
Patient returning call about calcium scan. She has not heard anyting from the office yet

## 2022-04-25 NOTE — Telephone Encounter (Signed)
MyChart message sent to patient. Closing this encounter.

## 2022-04-30 ENCOUNTER — Ambulatory Visit
Admission: RE | Admit: 2022-04-30 | Discharge: 2022-04-30 | Disposition: A | Discharge status: Home / Self Care | Payer: Medicare Other | Source: Ambulatory Visit | Attending: Cardiovascular Disease | Admitting: Cardiovascular Disease

## 2022-04-30 DIAGNOSIS — I2510 Atherosclerotic heart disease of native coronary artery without angina pectoris: Secondary | ICD-10-CM | POA: Insufficient documentation

## 2022-05-28 ENCOUNTER — Other Ambulatory Visit: Payer: Self-pay | Admitting: Cardiovascular Disease

## 2022-05-28 DIAGNOSIS — E890 Postprocedural hypothyroidism: Secondary | ICD-10-CM | POA: Diagnosis not present

## 2022-05-28 DIAGNOSIS — E559 Vitamin D deficiency, unspecified: Secondary | ICD-10-CM | POA: Diagnosis not present

## 2022-06-06 ENCOUNTER — Encounter: Payer: Self-pay | Admitting: Dermatology

## 2022-06-06 ENCOUNTER — Ambulatory Visit (INDEPENDENT_AMBULATORY_CARE_PROVIDER_SITE_OTHER): Payer: Medicare Other | Admitting: Dermatology

## 2022-06-06 DIAGNOSIS — R21 Rash and other nonspecific skin eruption: Secondary | ICD-10-CM

## 2022-06-06 DIAGNOSIS — L820 Inflamed seborrheic keratosis: Secondary | ICD-10-CM

## 2022-06-06 MED ORDER — FLUOCINOLONE ACETONIDE 0.01 % OT OIL: TOPICAL_OIL | OTIC | 2 refills | Status: DC

## 2022-06-06 NOTE — Progress Notes (Signed)
   Follow-Up Visit   Subjective  Meghan Mack is a 71 y.o. female who presents for the following: lesion (Right side of neck. Dur: few weeks. Itching, bothersome) and Rash (Inside ears. Started when began wearing hearing aids recently. Itching, scabby).  The patient has spots, moles and lesions to be evaluated, some may be new or changing and the patient has concerns that these could be cancer.   The following portions of the chart were reviewed this encounter and updated as appropriate:      Review of Systems: No other skin or systemic complaints except as noted in HPI or Assessment and Plan.   Objective  Well appearing patient in no apparent distress; mood and affect are within normal limits.  A focused examination was performed including neck, ears. Relevant physical exam findings are noted in the Assessment and Plan.  Right Posterior Neck x1 Erythematous keratotic or waxy stuck-on papule  Left Ear canal Erythema with scale and crust L>R ear canals/concha   Assessment & Plan  Inflamed seborrheic keratosis Right Posterior Neck x1  Symptomatic, irritating, patient would like treated.  Destruction of lesion - Right Posterior Neck x1  Destruction method: cryotherapy   Informed consent: discussed and consent obtained   Lesion destroyed using liquid nitrogen: Yes   Region frozen until ice ball extended beyond lesion: Yes   Outcome: patient tolerated procedure well with no complications   Post-procedure details: wound care instructions given   Additional details:  Prior to procedure, discussed risks of blister formation, small wound, skin dyspigmentation, or rare scar following cryotherapy. Recommend Vaseline ointment to treated areas while healing.   Related Medications triamcinolone (KENALOG) 0.025 % cream Apply 1 application. topically as directed. Qd to bid aa chest, shoulders until clear then prn flares, avoid face, groin, axilla  Rash Left Ear  canal  Seborrheic dermatitis vs irritant contact dermatitis secondary to hearing aids  Start Derm-otic drops 1-2 drops up to twice daily as needed for itching/irritation. May also apply  a thin layer of vaseline ointment to ears prior to using hearing aid as a skin protectant. Call back if too expensive at local pharmacy, will send to [facility].  Fluocinolone Acetonide 0.01 % OIL - Left Ear canal Apply 1-2 drops up to twice daily to ears as needed for itching/irritation   Return for TBSE As Scheduled.  I, Emelia Salisbury, CMA, am acting as scribe for Brendolyn Patty, MD.  Documentation: I have reviewed the above documentation for accuracy and completeness, and I agree with the above.  Brendolyn Patty MD

## 2022-06-06 NOTE — Patient Instructions (Addendum)
Cryotherapy Aftercare  Wash gently with soap and water everyday.   Apply Vaseline and band-aid daily until healed.    Ears: Start Derm-otic drops 1-2 drops up to twice daily to ears as needed for itching/irritation.   Due to recent changes in healthcare laws, you may see results of your pathology and/or laboratory studies on MyChart before the doctors have had a chance to review them. We understand that in some cases there may be results that are confusing or concerning to you. Please understand that not all results are received at the same time and often the doctors may need to interpret multiple results in order to provide you with the best plan of care or course of treatment. Therefore, we ask that you please give [place] 2 business days to thoroughly review all your results before contacting the office for clarification. Should we see a critical lab result, you will be contacted sooner.   If You Need Anything After Your Visit  If you have any questions or concerns for your doctor, please call our main line at [phone] and press option 4 to reach your doctor's medical assistant. If no one answers, please leave a voicemail as directed and we will return your call as soon as possible. Messages left after 4 pm will be answered the following business day.   You may also send [place] a message via [place]. We typically respond to MyChart messages within 1-2 business days.  For prescription refills, please ask your pharmacy to contact our office. Our fax number is [phone].  If you have an urgent issue when the clinic is closed that cannot wait until the next business day, you can page your doctor at the number below.    Please note that while we do our best to be available for urgent issues outside of office hours, we are not available 24/7.   If you have an urgent issue and are unable to reach [place], you may choose to seek medical care at your doctor's office, retail clinic, urgent care center, or  emergency room.  If you have a medical emergency, please immediately call 911 or go to the emergency department.  Pager Numbers  - Dr. Nehemiah Massed: [phone]  - Dr. Laurence Ferrari: [phone]  - Dr. Nicole Kindred: [phone]  In the event of inclement weather, please call our main line at [phone] for an update on the status of any delays or closures.  Dermatology Medication Tips: Please keep the boxes that topical medications come in in order to help keep track of the instructions about where and how to use these. Pharmacies typically print the medication instructions only on the boxes and not directly on the medication tubes.   If your medication is too expensive, please contact our office at [phone] option 4 or send [place] a message through [place].   We are unable to tell what your co-pay for medications will be in advance as this is different depending on your insurance coverage. However, we may be able to find a substitute medication at lower cost or fill out paperwork to get insurance to cover a needed medication.   If a prior authorization is required to get your medication covered by your insurance company, please allow [place] 1-2 business days to complete this process.  Drug prices often vary depending on where the prescription is filled and some pharmacies may offer cheaper prices.  The website www.goodrx.com contains coupons for medications through different pharmacies. The prices here do not account for what the cost  may be with help from insurance (it may be cheaper with your insurance), but the website can give you the price if you did not use any insurance.  - You can print the associated coupon and take it with your prescription to the pharmacy.  - You may also stop by our office during regular business hours and pick up a GoodRx coupon card.  - If you need your prescription sent electronically to a different pharmacy, notify our office through [facility] or by phone at  [phone] option 4.     Si Usted Necesita Algo Despus de Su Visita  Tambin puede enviarnos un mensaje a travs de Pharmacist, community. Por lo general respondemos a los mensajes de MyChart en el transcurso de 1 a 2 das hbiles.  Para renovar recetas, por favor pida a su farmacia que se ponga en contacto con nuestra oficina. Harland Dingwall de fax es [place] [phone].  Si tiene un asunto urgente cuando la clnica est cerrada y que no puede esperar hasta el siguiente da hbil, puede llamar/localizar a su doctor(a) al nmero que aparece a continuacin.   Por favor, tenga en cuenta que aunque hacemos todo lo posible para estar disponibles para asuntos urgentes fuera del horario de [place], no estamos disponibles las 24 horas del da, los 7 das de la [place].   Si tiene un problema urgente y no puede comunicarse con nosotros, puede optar por buscar atencin mdica  en el consultorio de su doctor(a), en una clnica privada, en un centro de atencin urgente o en una sala de emergencias.  Si tiene Engineering geologist, por favor llame inmediatamente al 911 o vaya a la sala de emergencias.  Nmeros de bper  - Dr. Nehemiah Massed: [phone]  - Dra. Moye: [phone]  - Dra. Nicole Kindred: [phone]  En caso de inclemencias del [place], por favor llame a Johnsie Kindred principal al [phone] para una actualizacin sobre el [place] de cualquier retraso o cierre.  Consejos para la medicacin en dermatologa: Por favor, guarde las cajas en las que vienen los medicamentos de uso tpico para ayudarle a seguir las instrucciones sobre dnde y cmo usarlos. Las farmacias generalmente imprimen las instrucciones del medicamento slo en las cajas y no directamente en los tubos del [place].   Si su medicamento es muy caro, por favor, pngase en contacto con Zigmund Daniel llamando al [phone] y presione la opcin 4 o envenos un mensaje a travs de Pharmacist, community.   No podemos decirle cul ser su copago por los  medicamentos por adelantado ya que esto es diferente dependiendo de la cobertura de su seguro. Sin embargo, es posible que podamos encontrar un medicamento sustituto a Electrical engineer un formulario para que el seguro cubra el medicamento que se considera necesario.   Si se requiere una autorizacin previa para que su compaa de seguros [place] su medicamento, por favor permtanos de 1 a 2 das hbiles para completar este proceso.  Los precios de los medicamentos varan con frecuencia dependiendo del Environmental consultant de dnde se surte la receta y alguna farmacias pueden ofrecer precios ms baratos.  El sitio web www.goodrx.com tiene cupones para medicamentos de Airline pilot. Los precios aqu no tienen en cuenta lo que podra costar con la ayuda del seguro (puede ser ms barato con su seguro), pero el sitio web puede darle el precio si no utiliz Research scientist (physical sciences).  - Puede imprimir el cupn correspondiente y llevarlo con su receta a la farmacia.  - Tambin puede pasar por Cleotis Nipper  oficina durante el horario de atencin regular y Charity fundraiser una tarjeta de cupones de GoodRx.  - Si necesita que su receta se enve electrnicamente a una farmacia diferente, informe a nuestra oficina a travs de MyChart de [place] o por telfono llamando al [phone] y presione la opcin 4.

## 2022-06-13 DIAGNOSIS — E890 Postprocedural hypothyroidism: Secondary | ICD-10-CM | POA: Diagnosis not present

## 2022-07-14 NOTE — Progress Notes (Unsigned)
Cardiology Office Note  Date:  07/14/2022   ID:  Meghan Mack, Meghan Mack 1950/12/24, MRN [id number]  PCP:  Burnard Hawthorne, FNP   No chief complaint on file.   HPI:  Meghan Mack is a very pleasant 71 year old woman with a history of  palpitations,  anxiety,  remote history of chest pain,  hyperlipidemia  30 day monitor in 2014 showed APCs,short runs atrial tachycardia/SVT with rate 166  hypertension  mild coronary calcification of the LAD and RCA on CT MRI with moderate stenosis cerebral vascular disease who presents for routine followup  Of her coronary calcifications, Aortic atherosclerosis, tachycardia peripheral arterial disease  Last seen in clinic September 2022  CT coronary calcium scoring July 2023 Atherosclerosis of thoracic aorta Coronary calcium score of 183. This was 72nd percentile for age and sex matched control. CAC 100-299 in LAD, RCA.   Husband passed, prostate cancer Adjusting to his  passing  Rare fluttering, palpitations Takes bystolic 5 mg daily  Compression hose in place for chronic lower extremity lymphedema  Tolerating statin, simvastatin Previously on Zetia, stopped secondary to stomach issues  No recent lab work  Prior CT scan reviewed showing mild coronary calcification LAD, RCA, aortic atherosclerosis mild  lab work reviewed  total cholesterol 154 LDL 64 Normal BMP Hemoglobin A1c 5.7  EKG personally reviewed by myself on todays visit Normal sinus rhythm rate 81 bpm no significant ST or T wave changes  Past medical history reviewed MRI, was having sharp pain in head Moderate stenosis right P2 segment Moderate to severe stenosis left internal carotid artery at the skull base and moderate stenosis right internal carotid artery at the skull base. Mild atherosclerotic disease right anterior cerebral Artery.   PMH:   has a past medical history of Anemia, Colonic polyp, Constipation, COPD (chronic obstructive pulmonary disease)  ([place]), Hyperlipidemia, IBS (irritable bowel syndrome), Kidney cysts, Lung nodule, Meningioma ([place]), Nausea, Palpitations, and Tonsillar cyst.  PSH:    Past Surgical History:  Procedure Laterality Date   CARDIAC CATHETERIZATION  02/25/2007   COLONOSCOPY     ESOPHAGOGASTRODUODENOSCOPY  10/1997 & 08/2005   FOOT SURGERY  04/25/2007   right   THYROIDECTOMY  11/2005   VAGINAL HYSTERECTOMY     ovaries intact    Current Outpatient Medications  Medication Sig Dispense Refill   BYSTOLIC 5 MG tablet TAKE 1 TABLET (5 MG TOTAL) BY MOUTH DAILY. MAY TAKE EXTRA 1 TABLET (5 MG )AS NEEDED FOR TACHYCARDIA 90 tablet 0   ergocalciferol (VITAMIN D2) 50000 UNITS capsule Take 50,000 Units by mouth every 30 (thirty) days.     Fluocinolone Acetonide 0.01 % OIL Apply 1-2 drops up to twice daily to ears as needed for itching/irritation 20 mL 2   ibuprofen (ADVIL,MOTRIN) 600 MG tablet Take 1 tablet (600 mg total) by mouth every 8 (eight) hours as needed. 20 tablet 0   latanoprost (XALATAN) 0.005 % ophthalmic solution SMARTSIG:In Eye(s)     levothyroxine (SYNTHROID, LEVOTHROID) 88 MCG tablet Take 1 tablet 6 days a week.     metroNIDAZOLE (METROCREAM) 0.75 % cream Apply to face once to twice daily for rosacea. 45 g 5   Multiple Vitamin (MULTIVITAMIN PO) Take 1 tablet by mouth daily.     simvastatin (ZOCOR) 20 MG tablet TAKE 1 TABLET BY MOUTH EVERY DAY IN THE EVENING 90 tablet 3   triamcinolone (KENALOG) 0.025 % cream Apply 1 application. topically as directed. Qd to bid aa chest, shoulders until clear then prn flares, avoid face, groin,  axilla 30 g 2   No current facility-administered medications for this visit.     Allergies:   Diflucan [fluconazole], Tdap [tetanus-diphth-acell pertussis], Codeine, Propantheline bromide, Sulfa antibiotics, Sulfonamide derivatives, Esomeprazole magnesium, Gadolinium derivatives, Meperidine hcl, Pneumovax 23 [pneumococcal vac polyvalent], and Propoxyphene n-acetaminophen   Social  History:  The patient  reports that she quit smoking about 24 years ago. Her smoking use included cigarettes. She has a 2.50 pack-year smoking history. She has never used smokeless tobacco. She reports that she does not drink alcohol and does not use drugs.   Family History:   family history includes Cancer in her brother, father, maternal aunt, maternal grandfather, and mother; Emphysema in her father; Heart disease in her father.    Review of Systems: Review of Systems  Constitutional: Negative.   HENT: Negative.    Respiratory: Negative.    Cardiovascular: Negative.   Gastrointestinal: Negative.   Musculoskeletal: Negative.   Neurological: Negative.   Psychiatric/Behavioral: Negative.    All other systems reviewed and are negative.   PHYSICAL EXAM: VS:  There were no vitals taken for this visit. , BMI There is no height or weight on file to calculate BMI. Constitutional:  oriented to person, place, and time. No distress.  HENT:  Head: Grossly normal Eyes:  no discharge. No scleral icterus.  Neck: No JVD, no carotid bruits  Cardiovascular: Regular rate and rhythm, no murmurs appreciated Pulmonary/Chest: Clear to auscultation bilaterally, no wheezes or rails Abdominal: Soft.  no distension.  no tenderness.  Musculoskeletal: Normal range of motion Neurological:  normal muscle tone. Coordination normal. No atrophy Skin: Skin warm and dry Psychiatric: normal affect, pleasant  Recent Labs: 07/18/2021: ALT 18; BUN 8; Creatinine, Ser 0.68; Hemoglobin 12.9; Platelets 186.0; Potassium 4.1; Sodium 141; TSH 2.02    Lipid Panel Lab Results  Component Value Date   CHOL 152 07/18/2021   HDL 75.10 07/18/2021   LDLCALC 60 07/18/2021   TRIG 87.0 07/18/2021      Wt Readings from Last 3 Encounters:  03/23/22 149 lb (67.6 kg)  07/14/21 151 lb (68.5 kg)  07/11/21 150 lb 6 oz (68.2 kg)      ASSESSMENT AND PLAN:  Peripheral arterial disease Stressed importance of aggressive lipid  control Recent weight loss, has lab work pending through primary care typically done once a year around September  Tachycardia - Periodic episodes, moderately controlled with low-dose beta-blocker Extra beta-blocker as needed for bad days   Obstructive sleep apnea - Reports sleeping relatively well Weight down  TOBACCO ABUSE - Plan: EKG 12-Lead Prior history of smoking Stop smoking  Mixed hyperlipidemia - Plan: EKG 12-Lead Did not tolerate Zetia,  Continue simvastatin 20 for now, weight trending down, cooking for one  CAD Mild coronary calcification and mild to moderate diffuse aortic atherosclerosis Continue aggressive lipid management Denies anginal symptoms    Total encounter time more than 25 minutes  Greater than 50% was spent in counseling and coordination of care with the patient    No orders of the defined types were placed in this encounter.    Signed, Esmond Plants, M.D., Ph.D. 07/14/2022  [place], [place]

## 2022-07-16 ENCOUNTER — Ambulatory Visit: Payer: Medicare Other | Attending: Cardiovascular Disease | Admitting: Cardiovascular Disease

## 2022-07-16 ENCOUNTER — Encounter: Payer: Self-pay | Admitting: Cardiovascular Disease

## 2022-07-16 VITALS — BP 120/82 | HR 91 | Ht 67.5 in | Wt 144.5 lb

## 2022-07-16 DIAGNOSIS — I2510 Atherosclerotic heart disease of native coronary artery without angina pectoris: Secondary | ICD-10-CM | POA: Diagnosis not present

## 2022-07-16 DIAGNOSIS — E782 Mixed hyperlipidemia: Secondary | ICD-10-CM | POA: Diagnosis not present

## 2022-07-16 DIAGNOSIS — I739 Peripheral vascular disease, unspecified: Secondary | ICD-10-CM

## 2022-07-16 DIAGNOSIS — G4733 Obstructive sleep apnea (adult) (pediatric): Secondary | ICD-10-CM

## 2022-07-16 DIAGNOSIS — I700 Atherosclerosis of aorta: Secondary | ICD-10-CM

## 2022-07-16 DIAGNOSIS — I471 Supraventricular tachycardia, unspecified: Secondary | ICD-10-CM | POA: Diagnosis not present

## 2022-07-16 DIAGNOSIS — F17200 Nicotine dependence, unspecified, uncomplicated: Secondary | ICD-10-CM | POA: Diagnosis not present

## 2022-07-16 MED ORDER — NEBIVOLOL HCL 5 MG PO TABS: ORAL_TABLET | ORAL | 3 refills | Status: DC

## 2022-07-16 MED ORDER — SIMVASTATIN 20 MG PO TABS: ORAL_TABLET | ORAL | 3 refills | Status: DC

## 2022-07-16 MED ORDER — BYSTOLIC 5 MG PO TABS: ORAL_TABLET | ORAL | 3 refills | Status: DC

## 2022-07-16 NOTE — Patient Instructions (Signed)
Medication Instructions:  No changes  If you need a refill on your cardiac medications before your next appointment, please call your pharmacy.   Lab work: No new labs needed  Testing/Procedures: No new testing needed  Follow-Up: At [facility], you and your health needs are our priority.  As part of our continuing mission to provide you with exceptional heart care, we have created designated Provider Care Teams.  These Care Teams include your primary Cardiologist (physician) and Advanced Practice Providers (APPs -  Physician Assistants and Nurse Practitioners) who all work together to provide you with the care you need, when you need it.  You will need a follow up appointment in 12 months  Providers on your designated Care Team:   Christopher Berge, NP Ryan Dunn, PA-C Cadence Furth, PA-C  COVID-19 Vaccine Information can be found at: [email or web address] For questions related to vaccine distribution or appointments, please email [email or web address] or call [phone].   

## 2022-07-16 NOTE — Addendum Note (Signed)
Addended by: Alvina Filbert B on: 07/16/2022 10:49 AM   Modules accepted: Orders

## 2022-07-17 ENCOUNTER — Other Ambulatory Visit: Payer: Self-pay | Admitting: Family

## 2022-07-17 DIAGNOSIS — Z1231 Encounter for screening mammogram for malignant neoplasm of breast: Secondary | ICD-10-CM

## 2022-07-18 ENCOUNTER — Ambulatory Visit (INDEPENDENT_AMBULATORY_CARE_PROVIDER_SITE_OTHER): Payer: Medicare Other | Admitting: Family

## 2022-07-18 ENCOUNTER — Encounter: Payer: Self-pay | Admitting: Family

## 2022-07-18 ENCOUNTER — Telehealth: Payer: Self-pay | Admitting: Family

## 2022-07-18 VITALS — BP 122/78 | HR 86 | Temp 97.7°F | Ht 67.5 in | Wt 145.8 lb

## 2022-07-18 DIAGNOSIS — I700 Atherosclerosis of aorta: Secondary | ICD-10-CM

## 2022-07-18 DIAGNOSIS — R911 Solitary pulmonary nodule: Secondary | ICD-10-CM

## 2022-07-18 DIAGNOSIS — Z0000 Encounter for general adult medical examination without abnormal findings: Secondary | ICD-10-CM | POA: Diagnosis not present

## 2022-07-18 DIAGNOSIS — Z23 Encounter for immunization: Secondary | ICD-10-CM

## 2022-07-18 DIAGNOSIS — I2510 Atherosclerotic heart disease of native coronary artery without angina pectoris: Secondary | ICD-10-CM

## 2022-07-18 DIAGNOSIS — G4733 Obstructive sleep apnea (adult) (pediatric): Secondary | ICD-10-CM | POA: Diagnosis not present

## 2022-07-18 LAB — COMPREHENSIVE METABOLIC PANEL
ALT: 18 U/L (ref 0–35)
AST: 22 U/L (ref 0–37)
Albumin: 4.1 g/dL (ref 3.5–5.2)
Alkaline Phosphatase: 93 U/L (ref 39–117)
BUN: 10 mg/dL (ref 6–23)
CO2: 28 mEq/L (ref 19–32)
Calcium: 9.3 mg/dL (ref 8.4–10.5)
Chloride: 103 mEq/L (ref 96–112)
Creatinine, Ser: 0.7 mg/dL (ref 0.40–1.20)
GFR: 86.42 mL/min (ref >60.00)
Glucose, Bld: 93 mg/dL (ref 70–99)
Potassium: 3.8 mEq/L (ref 3.5–5.1)
Sodium: 139 mEq/L (ref 135–145)
Total Bilirubin: 0.9 mg/dL (ref 0.2–1.2)
Total Protein: 6.5 g/dL (ref 6.0–8.3)

## 2022-07-18 LAB — LIPID PANEL
Cholesterol: 148 mg/dL (ref 0–200)
HDL: 71 mg/dL (ref >39.00)
LDL Cholesterol: 64 mg/dL (ref 0–99)
NonHDL: 77.27
Total CHOL/HDL Ratio: 2
Triglycerides: 65 mg/dL (ref 0.0–149.0)
VLDL: 13 mg/dL (ref 0.0–40.0)

## 2022-07-18 LAB — CBC WITH DIFFERENTIAL/PLATELET
Basophils Absolute: 0 10*3/uL (ref 0.0–0.1)
Basophils Relative: 0.8 % (ref 0.0–3.0)
Eosinophils Absolute: 0.1 10*3/uL (ref 0.0–0.7)
Eosinophils Relative: 2.4 % (ref 0.0–5.0)
HCT: 37.4 % (ref 36.0–46.0)
Hemoglobin: 12.7 g/dL (ref 12.0–15.0)
Lymphocytes Relative: 28.4 % (ref 12.0–46.0)
Lymphs Abs: 1.2 10*3/uL (ref 0.7–4.0)
MCHC: 34 g/dL (ref 30.0–36.0)
MCV: 96.9 fl (ref 78.0–100.0)
Monocytes Absolute: 0.3 10*3/uL (ref 0.1–1.0)
Monocytes Relative: 8.1 % (ref 3.0–12.0)
Neutro Abs: 2.5 10*3/uL (ref 1.4–7.7)
Neutrophils Relative %: 60.3 % (ref 43.0–77.0)
Platelets: 166 10*3/uL (ref 150.0–400.0)
RBC: 3.86 Mil/uL — ABNORMAL LOW (ref 3.87–5.11)
RDW: 12.9 % (ref 11.5–15.5)
WBC: 4.2 10*3/uL (ref 4.0–10.5)

## 2022-07-18 LAB — B12 AND FOLATE PANEL
Folate: >23.9 ng/mL (ref >5.9)
Vitamin B-12: 446 pg/mL (ref 211–911)

## 2022-07-18 LAB — VITAMIN D 25 HYDROXY (VIT D DEFICIENCY, FRACTURES): VITD: 55.14 ng/mL (ref 30.00–100.00)

## 2022-07-18 LAB — TSH: TSH: 0.73 u[IU]/mL (ref 0.35–5.50)

## 2022-07-18 LAB — HEMOGLOBIN A1C: Hgb A1c MFr Bld: 5.6 % (ref 4.6–6.5)

## 2022-07-18 NOTE — Telephone Encounter (Signed)
Call [facility] Get colonoscopy report  and ask when repeat colonoscopy is  Please share with pt

## 2022-07-18 NOTE — Assessment & Plan Note (Signed)
Patient declines clinical breast exam and pelvic today as she follows with GYN.  Requesting colonoscopy report from [place] clinic.  Encourage exercise.   patient will [facility] prior to having PCV 20 as she thinks she may have had.

## 2022-07-18 NOTE — Progress Notes (Signed)
Subjective:    Patient ID: Meghan Mack, female    DOB: 1951/08/02, 71 y.o.   MRN: [id number]  CC: Meghan Mack is a 71 y.o. female who presents today for physical exam.    HPI: She complains of fatigue for the past couple of months.  Sleep is not restorative.  Endorses nocturia, apneic episode.  H/o osa   Follow-up Dr. Rockey Situ 10-02 -23 regarding peripheral artery disease, history of smoking, mild aortic atherosclerosis, tachycardia, Recommend to stay on simvastatin, Bystolic 5 mg qd Colorectal Cancer Screening: UTD , no report in chart Breast Cancer Screening: Mammogram scheduled Cervical Cancer Screening: Following with OB GYN in [place], she had a normal pap smear this year.  She continues to have pap smear Bone Health screening/DEXA for 65+: scheduled   AAA screen completed 06/28/2020 negative for abdominal aortic aneurysm        tetanus - Due        Pneumococcal - Candidate for PCV20 5 years after last PNA vaccine   Labs: Screening labs today. Exercise: she walks her dog.  Alcohol use:  none Smoking/tobacco use: former smoker. Quit 1998     HISTORY:  Past Medical History:  Diagnosis Date   Anemia    Colonic polyp    Constipation    COPD (chronic obstructive pulmonary disease) (HCC)    Hyperlipidemia    IBS (irritable bowel syndrome)    Kidney cysts    Lung nodule    Meningioma (HCC)    Nausea    Palpitations    Tonsillar cyst     Past Surgical History:  Procedure Laterality Date   CARDIAC CATHETERIZATION  02/25/2007   COLONOSCOPY     ESOPHAGOGASTRODUODENOSCOPY  10/1997 & 08/2005   FOOT SURGERY  04/25/2007   right   THYROIDECTOMY  11/2005   VAGINAL HYSTERECTOMY     ovaries intact   Family History  Problem Relation Age of Onset   Cancer Mother        colon   Emphysema Father    Heart disease Father    Cancer Father        leukemia   Cancer Brother        lung   Cancer Maternal Aunt        melanoma   Cancer Maternal Grandfather         stomach, colon   Breast cancer Neg Hx    Kidney cancer Neg Hx    Kidney disease Neg Hx    Prostate cancer Neg Hx       ALLERGIES: Diflucan [fluconazole], Tdap [tetanus-diphth-acell pertussis], Codeine, Diphth-acell pertussis-tetanus, Ezetimibe, Propantheline, Propantheline bromide, Propoxyphene, Sulfa antibiotics, Sulfonamide derivatives, Esomeprazole magnesium, Gadolinium derivatives, Meperidine hcl, Pneumovax 23 [pneumococcal vac polyvalent], and Propoxyphene n-acetaminophen  Current Outpatient Medications on File Prior to Visit  Medication Sig Dispense Refill   BYSTOLIC 5 MG tablet TAKE 1 TABLET (5 MG TOTAL) BY MOUTH DAILY. MAY TAKE EXTRA 1 TABLET (5 MG )AS NEEDED FOR TACHYCARDIA 100 tablet 3   ergocalciferol (VITAMIN D2) 50000 UNITS capsule Take 50,000 Units by mouth every 30 (thirty) days.     Fluocinolone Acetonide 0.01 % OIL Apply 1-2 drops up to twice daily to ears as needed for itching/irritation 20 mL 2   ibuprofen (ADVIL,MOTRIN) 600 MG tablet Take 1 tablet (600 mg total) by mouth every 8 (eight) hours as needed. 20 tablet 0   latanoprost (XALATAN) 0.005 % ophthalmic solution SMARTSIG:In Eye(s)     levothyroxine (SYNTHROID, LEVOTHROID) 88 MCG  tablet Take 1 tablet 6 days a week.     metroNIDAZOLE (METROCREAM) 0.75 % cream Apply to face once to twice daily for rosacea. 45 g 5   Multiple Vitamin (MULTIVITAMIN PO) Take 1 tablet by mouth daily.     simvastatin (ZOCOR) 20 MG tablet TAKE 1 TABLET BY MOUTH EVERY DAY IN THE EVENING 90 tablet 3   triamcinolone (KENALOG) 0.025 % cream Apply 1 application. topically as directed. Qd to bid aa chest, shoulders until clear then prn flares, avoid face, groin, axilla 30 g 2   No current facility-administered medications on file prior to visit.    Social History   Tobacco Use   Smoking status: Former    Packs/day: 0.25    Years: 10.00    Total pack years: 2.50    Types: Cigarettes    Quit date: 09/25/1997    Years since quitting:  24.8   Smokeless tobacco: Never  Vaping Use   Vaping Use: Never used  Substance Use Topics   Alcohol use: No   Drug use: No    Review of Systems  Constitutional:  Negative for chills, fever and unexpected weight change.  HENT:  Negative for congestion.   Respiratory:  Negative for cough.   Cardiovascular:  Negative for chest pain, palpitations and leg swelling.  Gastrointestinal:  Negative for nausea and vomiting.  Musculoskeletal:  Negative for arthralgias and myalgias.  Skin:  Negative for rash.  Neurological:  Negative for headaches.  Hematological:  Negative for adenopathy.  Psychiatric/Behavioral:  Negative for confusion.       Objective:    BP 122/78 (BP Location: Left Arm, Patient Position: Sitting, Cuff Size: Normal)   Pulse 86   Temp 97.7 F (36.5 C) (Oral)   Ht 5' 7.5" (1.715 m)   Wt 145 lb 12.8 oz (66.1 kg)   SpO2 98%   BMI 22.50 kg/m   BP Readings from Last 3 Encounters:  07/18/22 122/78  07/16/22 120/82  07/14/21 128/76   Wt Readings from Last 3 Encounters:  07/18/22 145 lb 12.8 oz (66.1 kg)  07/16/22 144 lb 8 oz (65.5 kg)  03/23/22 149 lb (67.6 kg)    Physical Exam Vitals reviewed.  Constitutional:      Appearance: She is well-developed.  Eyes:     Conjunctiva/sclera: Conjunctivae normal.  Cardiovascular:     Rate and Rhythm: Normal rate and regular rhythm.     Pulses: Normal pulses.     Heart sounds: Normal heart sounds.  Pulmonary:     Effort: Pulmonary effort is normal.     Breath sounds: Normal breath sounds. No wheezing, rhonchi or rales.  Skin:    General: Skin is warm and dry.  Neurological:     Mental Status: She is alert.  Psychiatric:        Speech: Speech normal.        Behavior: Behavior normal.        Thought Content: Thought content normal.        Assessment & Plan:   Problem List Items Addressed This Visit       Cardiovascular and Mediastinum   Aortic atherosclerosis (HCC)    Chronic, stable.  Pending lipid  panel.  Continue simvastatin 20 mg      Coronary artery calcification seen on CT scan     Respiratory   Lung nodule   Obstructive sleep apnea    Discussed fatigue which I suspect is multifactorial including untreated sleep apnea, inadequate exercise.  In  the setting of nocturia, nonrestorative sleep, we have opted for her to consult pulmonology for sleep study evaluation.  Pending thorough lab evaluation as well for any metabolic reason for fatigue.  Advised patient to follow with me closely to ensure fatigue improves.      Relevant Orders   Ambulatory referral to Pulmonology     Other   Routine physical examination - Primary    Patient declines clinical breast exam and pelvic today as she follows with GYN.  Requesting colonoscopy report from [place] [facility].  Encourage exercise.   [facility] to [facility]V 20 as she thinks she may have had.       Relevant Orders   Comprehensive metabolic panel   CBC with Differential/Platelet   Hemoglobin A1c   Lipid panel   TSH   VITAMIN D 25 Hydroxy (Vit-D Deficiency, Fractures)   B12 and Folate Panel   Other Visit Diagnoses     Need for immunization against influenza       Relevant Orders   Flu Vaccine QUAD High Dose(Fluad) (Completed)        I am having Luberta Robertson maintain her Multiple Vitamin (MULTIVITAMIN PO), levothyroxine, ergocalciferol, ibuprofen, latanoprost, metroNIDAZOLE, triamcinolone, Fluocinolone Acetonide, simvastatin, and Bystolic.   No orders of the defined types were placed in this encounter.   Return precautions given.   Risks, benefits, and alternatives of the medications and treatment plan prescribed today were discussed, and patient expressed understanding.   Education regarding symptom management and diagnosis given to patient on AVS.   Continue to follow with Burnard Hawthorne, FNP for routine health maintenance.   Meghan Mack and I agreed with plan.   Mable Paris,  FNP

## 2022-07-18 NOTE — Patient Instructions (Signed)
Referral to pulmonology Let us know if you dont hear back within a week in regards to an appointment being scheduled.   Health Maintenance for Postmenopausal Women Menopause is a normal process in which your ability to get pregnant comes to an end. This process happens slowly over many months or years, usually between the ages of 33 and 76. Menopause is complete when you have missed your menstrual period for 12 months. It is important to talk with your health care provider about some of the most common conditions that affect women after menopause (postmenopausal women). These include heart disease, cancer, and bone loss (osteoporosis). Adopting a healthy lifestyle and getting preventive care can help to promote your health and wellness. The actions you take can also lower your chances of developing some of these common conditions. What are the signs and symptoms of menopause? During menopause, you may have the following symptoms: Hot flashes. These can be moderate or severe. Night sweats. Decrease in sex drive. Mood swings. Headaches. Tiredness (fatigue). Irritability. Memory problems. Problems falling asleep or staying asleep. Talk with your health care provider about treatment options for your symptoms. Do I need hormone replacement therapy? Hormone replacement therapy is effective in treating symptoms that are caused by menopause, such as hot flashes and night sweats. Hormone replacement carries certain risks, especially as you become older. If you are thinking about using estrogen or estrogen with progestin, discuss the benefits and risks with your health care provider. How can I reduce my risk for heart disease and stroke? The risk of heart disease, heart attack, and stroke increases as you age. One of the causes may be a change in the body's hormones during menopause. This can affect how your body uses dietary fats, triglycerides, and cholesterol. Heart attack and stroke are medical  emergencies. There are many things that you can do to help prevent heart disease and stroke. Watch your blood pressure High blood pressure causes heart disease and increases the risk of stroke. This is more likely to develop in people who have high blood pressure readings or are overweight. Have your blood pressure checked: Every 3-5 years if you are 3-55 years of age. Every year if you are 33 years old or older. Eat a healthy diet  Eat a diet that includes plenty of vegetables, fruits, low-fat dairy products, and lean protein. Do not eat a lot of foods that are high in solid fats, added sugars, or sodium. Get regular exercise Get regular exercise. This is one of the most important things you can do for your health. Most adults should: Try to exercise for at least 150 minutes each week. The exercise should increase your heart rate and make you sweat (moderate-intensity exercise). Try to do strengthening exercises at least twice each week. Do these in addition to the moderate-intensity exercise. Spend less time sitting. Even light physical activity can be beneficial. Other tips Work with your health care provider to achieve or maintain a healthy weight. Do not use any products that contain nicotine or tobacco. These products include cigarettes, chewing tobacco, and vaping devices, such as e-cigarettes. If you need help quitting, ask your health care provider. Know your numbers. Ask your health care provider to check your cholesterol and your blood sugar (glucose). Continue to have your blood tested as directed by your health care provider. Do I need screening for cancer? Depending on your health history and family history, you may need to have cancer screenings at different stages of your life. This  may include screening for: Breast cancer. Cervical cancer. Lung cancer. Colorectal cancer. What is my risk for osteoporosis? After menopause, you may be at increased risk for osteoporosis.  Osteoporosis is a condition in which bone destruction happens more quickly than new bone creation. To help prevent osteoporosis or the bone fractures that can happen because of osteoporosis, you may take the following actions: If you are 39-44 years old, get at least 1,000 mg of calcium and at least 600 international units (IU) of vitamin D per day. If you are older than age 44 but younger than age 55, get at least 1,200 mg of calcium and at least 600 international units (IU) of vitamin D per day. If you are older than age 38, get at least 1,200 mg of calcium and at least 800 international units (IU) of vitamin D per day. Smoking and drinking excessive alcohol increase the risk of osteoporosis. Eat foods that are rich in calcium and vitamin D, and do weight-bearing exercises several times each week as directed by your health care provider. How does menopause affect my mental health? Depression may occur at any age, but it is more common as you become older. Common symptoms of depression include: Feeling depressed. Changes in sleep patterns. Changes in appetite or eating patterns. Feeling an overall lack of motivation or enjoyment of activities that you previously enjoyed. Frequent crying spells. Talk with your health care provider if you think that you are experiencing any of these symptoms. General instructions See your health care provider for regular wellness exams and vaccines. This may include: Scheduling regular health, dental, and eye exams. Getting and maintaining your vaccines. These include: Influenza vaccine. Get this vaccine each year before the flu season begins. Pneumonia vaccine. Shingles vaccine. Tetanus, diphtheria, and pertussis (Tdap) booster vaccine. Your health care provider may also recommend other immunizations. Tell your health care provider if you have ever been abused or do not feel safe at home. Summary Menopause is a normal process in which your ability to get  pregnant comes to an end. This condition causes hot flashes, night sweats, decreased interest in sex, mood swings, headaches, or lack of sleep. Treatment for this condition may include hormone replacement therapy. Take actions to keep yourself healthy, including exercising regularly, eating a healthy diet, watching your weight, and checking your blood pressure and blood sugar levels. Get screened for cancer and depression. Make sure that you are up to date with all your vaccines. This information is not intended to replace advice given to you by your health care provider. Make sure you discuss any questions you have with your health care provider. Document Revised: 02/20/2021 Document Reviewed: 02/20/2021 Elsevier Patient Education  [place].

## 2022-07-18 NOTE — Assessment & Plan Note (Signed)
Discussed fatigue which I suspect is multifactorial including untreated sleep apnea, inadequate exercise.  In the setting of nocturia, nonrestorative sleep, we have opted for her to consult pulmonology for sleep study evaluation.  Pending thorough lab evaluation as well for any metabolic reason for fatigue.  Advised patient to follow with me closely to ensure fatigue improves.

## 2022-07-18 NOTE — Assessment & Plan Note (Addendum)
Chronic, stable.  Pending lipid panel.  Continue simvastatin 20 mg

## 2022-07-19 ENCOUNTER — Encounter: Payer: Self-pay | Admitting: Family

## 2022-07-19 NOTE — Telephone Encounter (Addendum)
Called [facility] and spoke to [facility] and she stated that pt last colonoscopy was done in Set  2020 and next one needs to be done Sept 2025. She stated that she would fax report over to [place]. Patient was notified also.

## 2022-07-20 ENCOUNTER — Encounter: Payer: Self-pay | Admitting: Family

## 2022-07-23 ENCOUNTER — Other Ambulatory Visit: Payer: Self-pay | Admitting: Family

## 2022-07-23 DIAGNOSIS — H40003 Preglaucoma, unspecified, bilateral: Secondary | ICD-10-CM | POA: Diagnosis not present

## 2022-07-23 DIAGNOSIS — R899 Unspecified abnormal finding in specimens from other organs, systems and tissues: Secondary | ICD-10-CM

## 2022-07-31 ENCOUNTER — Encounter: Payer: Self-pay | Admitting: Adult Health

## 2022-07-31 ENCOUNTER — Ambulatory Visit (INDEPENDENT_AMBULATORY_CARE_PROVIDER_SITE_OTHER): Payer: Medicare Other | Admitting: Adult Health

## 2022-07-31 VITALS — BP 112/80 | HR 82 | Temp 97.9°F | Ht 67.0 in | Wt 146.8 lb

## 2022-07-31 DIAGNOSIS — R400 Somnolence: Secondary | ICD-10-CM

## 2022-07-31 NOTE — Patient Instructions (Signed)
Set up for home sleep study [facility] office)  Healthy sleep regimen  Follow up in 2 months to discuss results and treatment plan ([place] offic)

## 2022-07-31 NOTE — Addendum Note (Signed)
Addended by: Marshia Ly R on: 07/31/2022 10:14 AM   Modules accepted: Orders

## 2022-07-31 NOTE — Assessment & Plan Note (Signed)
Daytime sleepiness, restless sleep, and gasping for air concerning for sleep apnea  - discussed how weight can impact sleep and risk for sleep disordered breathing - discussed options to assist with weight loss: combination of diet modification, cardiovascular and strength training exercises   - had an extensive discussion regarding the adverse health consequences related to untreated sleep disordered breathing - specifically discussed the risks for hypertension, coronary artery disease, cardiac dysrhythmias, cerebrovascular disease, and diabetes - lifestyle modification discussed   - discussed how sleep disruption can increase risk of accidents, particularly when driving - safe driving practices were discussed   Plan  Patient Instructions  Set up for home sleep study [facility] office)  Healthy sleep regimen  Follow up in 2 months to discuss results and treatment plan ([place] offic)   '

## 2022-07-31 NOTE — Progress Notes (Addendum)
$'@Patient'L$  ID: Meghan Mack, female    DOB: July 30, 1951, 71 y.o.   MRN: [id number]  Chief Complaint  Patient presents with   Consult    Referring provider: Burnard Hawthorne, FNP  HPI: 71 year old female former smoker seen for sleep consult July 31, 2022 restless sleep, gasping for air during sleep, daytime sleepiness .  Former patient of Dr. Alva Garnet seen in 2019 for abnormal CT chest   TEST/EVENTS :  CT chest 10/31/17:   Mild progression of biapical pleuroparenchymal scarring. Minimal reticulonodular change within the right middle lobe, for which inflammatory/infectious process should be considered. Atypical infection should also be considered, particularly mycobacterium avium intracellulare. No suspicious pulmonary nodules. Calcified granulomata within the spleen   Ct chest 04/28/18: Decrease and  reticulonodular type opacities at the base of the right middle lobe. Residual opacities appear due to mildly dilated bronchi with secretions, again raising suspicion for atypical infection such as MAI. No suspicious pulmonary nodules  NPSG September 25, 2016 showed no significant sleep apnea with total AHI 1.8/hour, SPO2 low at 85% (REM AHI 4.6/hour and supine AHI 15.3/hour.  07/31/2022 Sleep consult  Patient presents for a sleep consult today for restless sleep, gasping for air during sleep and daytime sleepiness.  Kindly referred by her primary care provider Meghan Mack, [place].  Patient says that she feels tired during the daytime.  Feels that her sleep is very fragmented and wakes up unrefreshed.  Patient says she had a sleep study done about 5 years ago.  NPSG September 25, 2016 showed no significant sleep apnea with a total AHI at 1.8/hour.  SPO2 low at 85%.  (REM AHI was 4.6/hour.  Supine AHI was 15.3/hour). Patient typically goes to bed about 8 PM.  Takes 5 to 10 minutes to go to sleep.  Is up 3-4 times throughout the night.  Gets up about 4 AM.  Weight is down about 5 pounds over  the last 2 years.  Current weight is 146 pounds with a BMI at 22.  Patient does not take any sleep aids.  No symptoms concerning for sleep paralysis or cataplexy.  Caffeine intake 1/2 cup of coffee, 2 teas.  Epworth score is 3 out of 24.  Typically gets sleepy if she is a passenger in the car and with watching TV. No history of CHF or CVA. Mouth breather , has to wear a nose strip at night.  Gets up a lot at night to go to bathroom or wakes up and can not turn mind off.  No removable dental work.  No chest pain, edema, cough.   SH : Widowed, former smoker, , no etoh. Retired   FH: Emphysema, Cancer, Heart disease  Allergies  Allergen Reactions   Diflucan [Fluconazole] Hives   Tdap [Tetanus-Diphth-Acell Pertussis] Swelling and Rash   Codeine    Diphth-Acell Pertussis-Tetanus Other (See Comments)   Ezetimibe     Other reaction(s): Not available   Propantheline     Other reaction(s): Not available Other reaction(s): High heart rate   Propantheline Bromide    Propoxyphene     Other reaction(s): Not available   Sulfa Antibiotics Other (See Comments)   Sulfonamide Derivatives    Esomeprazole Magnesium Nausea Only   Gadolinium Derivatives Palpitations    gabastat used 05/2019 for MRI/MRA made heart race.  gabastat used 05/2019 for MRI/MRA made heart race.   Meperidine Hcl Palpitations    Causes heart to race   Pneumovax 23 [Pneumococcal Vac Polyvalent] Swelling  Extensive local reaction with arm swelling and induration   Propoxyphene N-Acetaminophen Palpitations    Immunization History  Administered Date(s) Administered   Fluad Quad(high Dose 65+) 07/14/2021, 07/18/2022   Influenza Split 07/15/2011, 06/26/2014   Influenza Whole 06/22/2009, 06/13/2010, 07/05/2012, 07/06/2018   Influenza, High Dose Seasonal PF 07/06/2018, 07/16/2019   Influenza,inj,quad, With Preservative 07/16/2019   Influenza-Unspecified 06/15/2013, 06/19/2015, 06/29/2016, 07/14/2017, 07/16/2019, 07/11/2020    Moderna Covid-19 Vaccine Bivalent Booster 31yr & up 10/27/2021   PFIZER(Purple Top)SARS-COV-2 Vaccination 11/23/2019, 12/18/2019, 08/01/2020   Pneumococcal Conjugate-13 06/27/2015   Pneumococcal Polysaccharide-23 08/11/2007   Td 07/17/2001   Tdap 09/03/2011   Zoster, Live 06/13/2010    Past Medical History:  Diagnosis Date   Anemia    Colonic polyp    Constipation    COPD (chronic obstructive pulmonary disease) (HCC)    Hyperlipidemia    IBS (irritable bowel syndrome)    Kidney cysts    Lung nodule    Meningioma (HCC)    Nausea    Palpitations    Tonsillar cyst     Tobacco History: Social History   Tobacco Use  Smoking Status Former   Packs/day: 0.25   Years: 10.00   Total pack years: 2.50   Types: Cigarettes   Quit date: 09/25/1997   Years since quitting: 24.8  Smokeless Tobacco Never   Counseling given: Not Answered  Past Surgical History:  Procedure Laterality Date   CARDIAC CATHETERIZATION  02/25/2007   COLONOSCOPY     ESOPHAGOGASTRODUODENOSCOPY  10/1997 & 08/2005   FOOT SURGERY  04/25/2007   right   THYROIDECTOMY  11/2005   VAGINAL HYSTERECTOMY     ovaries intact    Outpatient Medications Prior to Visit  Medication Sig Dispense Refill   BYSTOLIC 5 MG tablet TAKE 1 TABLET (5 MG TOTAL) BY MOUTH DAILY. MAY TAKE EXTRA 1 TABLET (5 MG )AS NEEDED FOR TACHYCARDIA 100 tablet 3   ergocalciferol (VITAMIN D2) 50000 UNITS capsule Take 50,000 Units by mouth every 30 (thirty) days.     Fluocinolone Acetonide 0.01 % OIL Apply 1-2 drops up to twice daily to ears as needed for itching/irritation 20 mL 2   ibuprofen (ADVIL,MOTRIN) 600 MG tablet Take 1 tablet (600 mg total) by mouth every 8 (eight) hours as needed. 20 tablet 0   latanoprost (XALATAN) 0.005 % ophthalmic solution SMARTSIG:In Eye(s)     levothyroxine (SYNTHROID, LEVOTHROID) 88 MCG tablet Take 1 tablet 6 days a week.     metroNIDAZOLE (METROCREAM) 0.75 % cream Apply to face once to twice daily for rosacea.  45 g 5   Multiple Vitamin (MULTIVITAMIN PO) Take 1 tablet by mouth daily.     simvastatin (ZOCOR) 20 MG tablet TAKE 1 TABLET BY MOUTH EVERY DAY IN THE EVENING 90 tablet 3   triamcinolone (KENALOG) 0.025 % cream Apply 1 application. topically as directed. Qd to bid aa chest, shoulders until clear then prn flares, avoid face, groin, axilla 30 g 2   No facility-administered medications prior to visit.     Review of Systems:   Constitutional:   No  weight loss, night sweats,  Fevers, chills,  +fatigue, or  lassitude.  HEENT:   No headaches,  Difficulty swallowing,  Tooth/dental problems, or  Sore throat,                No sneezing, itching, ear ache, nasal congestion, post nasal drip,   CV:  No chest pain,  Orthopnea, PND, swelling in lower extremities, anasarca, dizziness, palpitations, syncope.  GI  No heartburn, indigestion, abdominal pain, nausea, vomiting, diarrhea, change in bowel habits, loss of appetite, bloody stools.   Resp: No shortness of breath with exertion or at rest.  No excess mucus, no productive cough,  No non-productive cough,  No coughing up of blood.  No change in color of mucus.  No wheezing.  No chest wall deformity  Skin: no rash or lesions.  GU: no dysuria, change in color of urine, no urgency or frequency.  No flank pain, no hematuria   MS:  No joint pain or swelling.  No decreased range of motion.  No back pain.    Physical Exam  BP 112/80 (BP Location: Left Arm, Cuff Size: Large)   Pulse 82   Temp 97.9 F (36.6 C) (Oral)   Ht '5\' 7"'$  (1.702 m)   Wt 146 lb 12.8 oz (66.6 kg)   SpO2 99%   BMI 22.99 kg/m   GEN: A/Ox3; pleasant , NAD, well nourished    HEENT:  [place]/AT,   NOSE-clear, THROAT-clear, no lesions, no postnasal drip or exudate noted. Class 2 MP airway   NECK:  Supple w/ fair ROM; no JVD; normal carotid impulses w/o bruits; no thyromegaly or nodules palpated; no lymphadenopathy.    RESP  Clear  P & A; w/o, wheezes/ rales/ or rhonchi. no  accessory muscle use, no dullness to percussion  CARD:  RRR, no m/r/g, no peripheral edema, pulses intact, no cyanosis or clubbing.  GI:   Soft & nt; nml bowel sounds; no organomegaly or masses detected.   Musco: Warm bil, no deformities or joint swelling noted.   Neuro: alert, no focal deficits noted.    Skin: Warm, no lesions or rashes    Lab Results:  CBC    BNP No results found for: "BNP"  ProBNP No results found for: "PROBNP"  Imaging: No results found.        No data to display          No results found for: "NITRICOXIDE"      Assessment & Plan:   Daytime sleepiness Daytime sleepiness, restless sleep, and gasping for air concerning for sleep apnea  - discussed how weight can impact sleep and risk for sleep disordered breathing - discussed options to assist with weight loss: combination of diet modification, cardiovascular and strength training exercises   - had an extensive discussion regarding the adverse health consequences related to untreated sleep disordered breathing - specifically discussed the risks for hypertension, coronary artery disease, cardiac dysrhythmias, cerebrovascular disease, and diabetes - lifestyle modification discussed   - discussed how sleep disruption can increase risk of accidents, particularly when driving - safe driving practices were discussed   Plan  Patient Instructions  Set up for home sleep study [facility] office)  Healthy sleep regimen  Follow up in 2 months to discuss results and treatment plan ([place] offic)   '     Rexene Edison, NP 07/31/2022

## 2022-08-01 NOTE — Progress Notes (Signed)
Reviewed and agree with assessment/plan.   Chesley Mires, MD [facility] Pulmonary/Critical Care 08/01/2022, 9:17 AM Pager:  [phone]

## 2022-08-23 ENCOUNTER — Ambulatory Visit
Admission: RE | Admit: 2022-08-23 | Discharge: 2022-08-23 | Disposition: A | Discharge status: Home / Self Care | Payer: Medicare Other | Source: Ambulatory Visit | Attending: Obstetrics and Gynecology | Admitting: Obstetrics and Gynecology

## 2022-08-23 DIAGNOSIS — M8580 Other specified disorders of bone density and structure, unspecified site: Secondary | ICD-10-CM

## 2022-08-23 DIAGNOSIS — M85851 Other specified disorders of bone density and structure, right thigh: Secondary | ICD-10-CM | POA: Diagnosis not present

## 2022-08-23 DIAGNOSIS — Z780 Asymptomatic menopausal state: Secondary | ICD-10-CM | POA: Diagnosis not present

## 2022-08-30 ENCOUNTER — Other Ambulatory Visit (INDEPENDENT_AMBULATORY_CARE_PROVIDER_SITE_OTHER): Payer: Medicare Other

## 2022-08-30 DIAGNOSIS — R899 Unspecified abnormal finding in specimens from other organs, systems and tissues: Secondary | ICD-10-CM

## 2022-08-30 LAB — CBC WITH DIFFERENTIAL/PLATELET
Basophils Absolute: 0 10*3/uL (ref 0.0–0.1)
Basophils Relative: 0.5 % (ref 0.0–3.0)
Eosinophils Absolute: 0.1 10*3/uL (ref 0.0–0.7)
Eosinophils Relative: 2.7 % (ref 0.0–5.0)
HCT: 38.3 % (ref 36.0–46.0)
Hemoglobin: 13.1 g/dL (ref 12.0–15.0)
Lymphocytes Relative: 30.2 % (ref 12.0–46.0)
Lymphs Abs: 1.3 10*3/uL (ref 0.7–4.0)
MCHC: 34.1 g/dL (ref 30.0–36.0)
MCV: 96.6 fl (ref 78.0–100.0)
Monocytes Absolute: 0.4 10*3/uL (ref 0.1–1.0)
Monocytes Relative: 9.6 % (ref 3.0–12.0)
Neutro Abs: 2.5 10*3/uL (ref 1.4–7.7)
Neutrophils Relative %: 57 % (ref 43.0–77.0)
Platelets: 181 10*3/uL (ref 150.0–400.0)
RBC: 3.97 Mil/uL (ref 3.87–5.11)
RDW: 12.9 % (ref 11.5–15.5)
WBC: 4.4 10*3/uL (ref 4.0–10.5)

## 2022-09-13 DIAGNOSIS — M216X2 Other acquired deformities of left foot: Secondary | ICD-10-CM | POA: Diagnosis not present

## 2022-09-13 DIAGNOSIS — M216X1 Other acquired deformities of right foot: Secondary | ICD-10-CM | POA: Diagnosis not present

## 2022-09-13 DIAGNOSIS — Q828 Other specified congenital malformations of skin: Secondary | ICD-10-CM | POA: Diagnosis not present

## 2022-09-13 DIAGNOSIS — M778 Other enthesopathies, not elsewhere classified: Secondary | ICD-10-CM | POA: Diagnosis not present

## 2022-09-13 DIAGNOSIS — M25871 Other specified joint disorders, right ankle and foot: Secondary | ICD-10-CM | POA: Diagnosis not present

## 2022-09-17 ENCOUNTER — Ambulatory Visit: Payer: Medicare Other

## 2022-09-17 DIAGNOSIS — R400 Somnolence: Secondary | ICD-10-CM

## 2022-09-17 DIAGNOSIS — G4733 Obstructive sleep apnea (adult) (pediatric): Secondary | ICD-10-CM

## 2022-09-21 ENCOUNTER — Other Ambulatory Visit: Payer: Self-pay | Admitting: Cardiovascular Disease

## 2022-09-24 ENCOUNTER — Other Ambulatory Visit: Payer: Self-pay | Admitting: Cardiovascular Disease

## 2022-09-28 DIAGNOSIS — G4733 Obstructive sleep apnea (adult) (pediatric): Secondary | ICD-10-CM | POA: Diagnosis not present

## 2022-10-04 ENCOUNTER — Telehealth (INDEPENDENT_AMBULATORY_CARE_PROVIDER_SITE_OTHER): Payer: Medicare Other | Admitting: Adult Health

## 2022-10-04 ENCOUNTER — Encounter: Payer: Self-pay | Admitting: Adult Health

## 2022-10-04 ENCOUNTER — Telehealth: Payer: Self-pay | Admitting: Adult Health

## 2022-10-04 DIAGNOSIS — G4733 Obstructive sleep apnea (adult) (pediatric): Secondary | ICD-10-CM | POA: Diagnosis not present

## 2022-10-04 NOTE — Progress Notes (Signed)
Virtual Visit via Video Note  I connected with Meghan Mack on 10/04/22 at  2:00 PM EST by a video enabled telemedicine application and verified that I am speaking with the correct person using two identifiers.  Location: Patient: Home  Provider: Office    I discussed the limitations of evaluation and management by telemedicine and the availability of in person appointments. The patient expressed understanding and agreed to proceed.  History of Present Illness: 71 year old female former smoker seen for sleep consult July 31 2022 for restless sleep, gasping for air during sleep and daytime sleepiness found to have mild sleep apnea  Today's virtual visit is 31-monthfollow-up.  Patient was seen last visit for sleep consult.  Complained of restless sleep, daytime sleepiness.  Was set up for home sleep study was completed earlier this month that showed mild sleep apnea with AHI 7.7/hour and SpO2 low at 85%.  We discussed her sleep study results in detail.  Patient would like to proceed with CPAP . She does not think she can wear oral appliance . Has significant symptom burden with restless sleep and daytime sleepiness.   Has sjogrens syndrome with dry mouth.    Observations/Objective: Home sleep study December fourth 2023 showed mild sleep apnea with AHI at 7.7/hour and SpO2 low at 85%   Assessment and Plan: Mild OSA - discussed treatment options  Patient education on OSA and CPAP  Begin CPAP auto 5-15 cmH2O  - discussed how weight can impact sleep and risk for sleep disordered breathing - discussed options to assist with weight loss: combination of diet modification, cardiovascular and strength training exercises   - had an extensive discussion regarding the adverse health consequences related to untreated sleep disordered breathing - specifically discussed the risks for hypertension, coronary artery disease, cardiac dysrhythmias, cerebrovascular disease, and diabetes - lifestyle  modification discussed   - discussed how sleep disruption can increase risk of accidents, particularly when driving - safe driving practices were discussed    Plan  Patient Instructions  Begin CPAP At bedtime all night long for at least 6 hr or more .  Healthy sleep regimen  Follow up in 3 months and As needed  [facility]     Follow Up Instructions:    I discussed the assessment and treatment plan with the patient. The patient was provided an opportunity to ask questions and all were answered. The patient agreed with the plan and demonstrated an understanding of the instructions.   The patient was advised to call back or seek an in-person evaluation if the symptoms worsen or if the condition fails to improve as anticipated.  I provided 22  minutes of non-face-to-face time during this encounter.   TRexene Edison NP

## 2022-10-04 NOTE — Telephone Encounter (Signed)
Patient is aware of results and voiced her understanding.  [place] visit scheduled today at 2:00. Nothing further needed.

## 2022-10-04 NOTE — Telephone Encounter (Signed)
Home sleep study December fourth 2023 showed mild sleep apnea with AHI at 7.7/hour and SpO2 low at 85%  Please set up virtual visit or an office visit to discuss sleep study results and treatment plan

## 2022-10-04 NOTE — Patient Instructions (Signed)
Begin CPAP At bedtime all night long for at least 6 hr or more .  Healthy sleep regimen  Follow up in 3 months and As needed  [facility] office)

## 2022-10-18 ENCOUNTER — Ambulatory Visit
Admission: RE | Admit: 2022-10-18 | Discharge: 2022-10-18 | Disposition: A | Discharge status: Home / Self Care | Payer: Medicare Other | Source: Ambulatory Visit | Attending: Family | Admitting: Family

## 2022-10-18 DIAGNOSIS — Z1231 Encounter for screening mammogram for malignant neoplasm of breast: Secondary | ICD-10-CM | POA: Diagnosis not present

## 2022-10-19 ENCOUNTER — Encounter: Payer: Self-pay | Admitting: Family

## 2022-10-19 ENCOUNTER — Ambulatory Visit (INDEPENDENT_AMBULATORY_CARE_PROVIDER_SITE_OTHER): Payer: Medicare Other

## 2022-10-19 ENCOUNTER — Ambulatory Visit (INDEPENDENT_AMBULATORY_CARE_PROVIDER_SITE_OTHER): Payer: Medicare Other | Admitting: Family

## 2022-10-19 VITALS — BP 120/72 | HR 88 | Temp 97.9°F | Ht 67.0 in | Wt 145.8 lb

## 2022-10-19 DIAGNOSIS — M545 Low back pain, unspecified: Secondary | ICD-10-CM | POA: Diagnosis not present

## 2022-10-19 DIAGNOSIS — R200 Anesthesia of skin: Secondary | ICD-10-CM

## 2022-10-19 DIAGNOSIS — G8929 Other chronic pain: Secondary | ICD-10-CM

## 2022-10-19 DIAGNOSIS — G4733 Obstructive sleep apnea (adult) (pediatric): Secondary | ICD-10-CM | POA: Diagnosis not present

## 2022-10-19 NOTE — Assessment & Plan Note (Signed)
Chronic, unchanged.  Pending lumbar x-ray to evaluate for progression of DDD.

## 2022-10-19 NOTE — Assessment & Plan Note (Addendum)
Intermittent, not continuous down leg.  Previous TSH, B12 within normal limits.  No history of diabetes.  H/o CAD.  Presentation not consistent with radicular symptoms from stenosis of the spine . etiology nonspecific at this time.  No symptoms of intermittent claudication however with cold extremities, we opted to pursue PAD evaluation.

## 2022-10-19 NOTE — Progress Notes (Signed)
Assessment & Plan:  Chronic midline low back pain without sciatica Assessment & Plan: Chronic, unchanged.  Pending lumbar x-ray to evaluate for progression of DDD.   Orders: -     DG Lumbar Spine Complete; Future  Obstructive sleep apnea  Left leg numbness Assessment & Plan: Intermittent, not continuous down leg.  Previous TSH, B12 within normal limits.  No history of diabetes.  H/o CAD.  Presentation not consistent with radicular symptoms from stenosis of the spine . etiology nonspecific at this time.  No symptoms of intermittent claudication however with cold extremities, we opted to pursue PAD evaluation.   Orders: -     VAS [place] LOWER EXTREMITY ARTERIAL DUPLEX; Future     Return precautions given.   Risks, benefits, and alternatives of the medications and treatment plan prescribed today were discussed, and patient expressed understanding.   Education regarding symptom management and diagnosis given to patient on AVS either electronically or printed.  Return for Complete Physical Exam.  Mable Paris, FNP  Subjective:    Patient ID: Meghan Mack, female    DOB: [birth year], 72 y.o.   MRN: [id number]  CC: Meghan Mack is a 72 y.o. female who presents today for follow up.   HPI: Left pretibial numbness. Describes more of a 'feeling of going numb' Comes and goes. Several months. A/w intermittent Left  lateral hip pain and low back pain.  No continuous left leg numbness. No calf pain or pain with walking.  Wearing compression stockings for bilateral ankle swelling which has been chronic. Worse at end of  the day.   No sob. Fatigue has improved. Improves with exercise    Following Tammy Parrett, Pulmonology last seen 10/04/2022 for mild OSA.  Begin CPAP,CPAP auto 5-15 cmH2O   Xr BL hips,  thoracic and lumbar 11/2019 -Lower lumbar facet degenerative disease is again seen and results in 0.6 cm anterolisthesis L4 on L5, unchanged. Intervertebral disc space  height is maintained.Unchanged exaggeration of thoracic kyphosis.  No degenerative changes in the hips.   Allergies: Diflucan [fluconazole], Tdap [tetanus-diphth-acell pertussis], Codeine, Diphth-acell pertussis-tetanus, Ezetimibe, Propantheline, Propantheline bromide, Propoxyphene, Sulfa antibiotics, Sulfonamide derivatives, Esomeprazole magnesium, Gadolinium derivatives, Meperidine hcl, Pneumovax 23 [pneumococcal vac polyvalent], and Propoxyphene n-acetaminophen Current Outpatient Medications on File Prior to Visit  Medication Sig Dispense Refill   BYSTOLIC 5 MG tablet TAKE 1 TABLET (5 MG TOTAL) BY MOUTH DAILY. MAY TAKE EXTRA 1 TABLET (5 MG )AS NEEDED FOR TACHYCARDIA 100 tablet 3   ergocalciferol (VITAMIN D2) 50000 UNITS capsule Take 50,000 Units by mouth every 30 (thirty) days.     Fluocinolone Acetonide 0.01 % OIL Apply 1-2 drops up to twice daily to ears as needed for itching/irritation 20 mL 2   ibuprofen (ADVIL,MOTRIN) 600 MG tablet Take 1 tablet (600 mg total) by mouth every 8 (eight) hours as needed. 20 tablet 0   latanoprost (XALATAN) 0.005 % ophthalmic solution SMARTSIG:In Eye(s)     levothyroxine (SYNTHROID, LEVOTHROID) 88 MCG tablet Take 1 tablet 6 days a week.     metroNIDAZOLE (METROCREAM) 0.75 % cream Apply to face once to twice daily for rosacea. 45 g 5   Multiple Vitamin (MULTIVITAMIN PO) Take 1 tablet by mouth daily.     simvastatin (ZOCOR) 20 MG tablet TAKE 1 TABLET BY MOUTH EVERY DAY IN THE EVENING 90 tablet 3   triamcinolone (KENALOG) 0.025 % cream Apply 1 application. topically as directed. Qd to bid aa chest, shoulders until clear then prn flares, avoid face, groin,  axilla 30 g 2   No current facility-administered medications on file prior to visit.    Review of Systems  Constitutional:  Negative for chills and fever.  Respiratory:  Negative for cough.   Cardiovascular:  Negative for chest pain and palpitations.  Gastrointestinal:  Negative for nausea and vomiting.   Musculoskeletal:  Positive for back pain.  Neurological:  Positive for numbness.      Objective:    BP 120/72   Pulse 88   Temp 97.9 F (36.6 C) (Oral)   Ht '5\' 7"'$  (1.702 m)   Wt 145 lb 12.8 oz (66.1 kg)   SpO2 99%   BMI 22.84 kg/m  BP Readings from Last 3 Encounters:  10/19/22 120/72  07/31/22 112/80  07/18/22 122/78   Wt Readings from Last 3 Encounters:  10/19/22 145 lb 12.8 oz (66.1 kg)  07/31/22 146 lb 12.8 oz (66.6 kg)  07/18/22 145 lb 12.8 oz (66.1 kg)    Physical Exam Vitals reviewed.  Constitutional:      Appearance: She is well-developed.  Eyes:     Conjunctiva/sclera: Conjunctivae normal.  Cardiovascular:     Rate and Rhythm: Normal rate and regular rhythm.     Pulses: Normal pulses.     Heart sounds: Normal heart sounds.     Comments: Nonpitting +1 bilateral pedal edema.  Feet feel cold to the touch.palpable bilateral pedal pulses.  Absence of hair growth  No erythema or increased warmth. No asymmetry in calf size when compared bilaterally varicosities noted.   Pulmonary:     Effort: Pulmonary effort is normal.     Breath sounds: Normal breath sounds. No wheezing, rhonchi or rales.  Musculoskeletal:     Lumbar back: No swelling, edema, spasms, tenderness or bony tenderness. Normal range of motion.     Comments: Full range of motion with flexion, tension, lateral side bends. No bony tenderness. No pain, numbness, tingling elicited with single leg raise bilaterally.   Skin:    General: Skin is warm and dry.  Neurological:     Mental Status: She is alert.     Sensory: No sensory deficit.     Deep Tendon Reflexes:     Reflex Scores:      Patellar reflexes are 2+ on the right side and 2+ on the left side.    Comments: Sensation and strength intact bilateral lower extremities.  Psychiatric:        Speech: Speech normal.        Behavior: Behavior normal.        Thought Content: Thought content normal.

## 2022-10-19 NOTE — Patient Instructions (Signed)
I have ordered an ultrasound to evaluate for peripheral artery disease of your legs  Let us know if you dont hear back within a week in regards to an appointment being scheduled.   So that you are aware, if you are [facility] MyChart user , please pay attention to your MyChart messages as you may receive a MyChart message with a phone number to call and schedule this test/appointment own your own from our referral coordinator. This is a new process so I do not want you to miss this message.  If you are not a MyChart user, you will receive a phone call.

## 2022-10-24 DIAGNOSIS — G4733 Obstructive sleep apnea (adult) (pediatric): Secondary | ICD-10-CM | POA: Diagnosis not present

## 2022-10-24 DIAGNOSIS — L718 Other rosacea: Secondary | ICD-10-CM | POA: Diagnosis not present

## 2022-10-25 ENCOUNTER — Telehealth: Payer: Self-pay | Admitting: Family

## 2022-10-25 NOTE — Telephone Encounter (Signed)
Pt called in staying that she would like to F/U with provider concerning a ultrasound results. She's available at [phone]

## 2022-10-26 ENCOUNTER — Telehealth: Payer: Self-pay | Admitting: Adult Health

## 2022-10-26 ENCOUNTER — Ambulatory Visit (INDEPENDENT_AMBULATORY_CARE_PROVIDER_SITE_OTHER): Payer: Medicare Other | Admitting: Adult Health

## 2022-10-26 ENCOUNTER — Encounter: Payer: Self-pay | Admitting: Adult Health

## 2022-10-26 VITALS — BP 118/64 | HR 77 | Temp 98.6°F | Ht 67.0 in | Wt 149.2 lb

## 2022-10-26 DIAGNOSIS — G4733 Obstructive sleep apnea (adult) (pediatric): Secondary | ICD-10-CM

## 2022-10-26 NOTE — Progress Notes (Signed)
$'@Patient'F$  ID: Meghan Mack, female    DOB: [birth year], 72 y.o.   MRN: [id number]  Chief Complaint  Patient presents with   Follow-up    Referring provider: Burnard Hawthorne, FNP  HPI: 72 year old female former smoker seen for sleep consult July 31, 2022 found to have mild sleep apnea  TEST/EVENTS :  09/2022 home sleep study was completed earlier this month that showed mild sleep apnea with AHI 7.7/hour and SpO2 low at 85%   10/26/2022 Follow up: OSA  Patient returns for follow-up visit.  Patient was seen last month for follow-up after recent home sleep study.  Patient had had some daytime sleepiness and restless sleep.  She was set up for home sleep study that was completed in early December that showed mild sleep apnea with AHI at 7.7/hour and SpO2 low at 85%.  Patient did not believe she would be able to wear a oral appliance.  She was started on CPAP therapy due to symptom burden.  Patient says she has tried multiple times with the CPAP and just cannot wear it.  She does not feel like that she is going to be able to go forward with this.  She says it has more trouble than its worth.  She just does not feel like it is working at all.  She wants to get rid of it.  Long discussion regarding sleep apnea potential treatment options.  Patient would like to proceed with positional sleep.  She does have a sleep number bed that she can adjust at nighttime.  Allergies  Allergen Reactions   Diflucan [Fluconazole] Hives   Tdap [Tetanus-Diphth-Acell Pertussis] Swelling and Rash   Codeine    Diphth-Acell Pertussis-Tetanus Other (See Comments)   Ezetimibe     Other reaction(s): Not available   Propantheline     Other reaction(s): Not available Other reaction(s): High heart rate   Propantheline Bromide    Propoxyphene     Other reaction(s): Not available   Sulfa Antibiotics Other (See Comments)   Sulfonamide Derivatives    Esomeprazole Magnesium Nausea Only   Gadolinium Derivatives  Palpitations    gabastat used 05/2019 for MRI/MRA made heart race.  gabastat used 05/2019 for MRI/MRA made heart race.   Meperidine Hcl Palpitations    Causes heart to race   Pneumovax 23 [Pneumococcal Vac Polyvalent] Swelling    Extensive local reaction with arm swelling and induration   Propoxyphene N-Acetaminophen Palpitations    Immunization History  Administered Date(s) Administered   Fluad Quad(high Dose 65+) 07/14/2021, 07/18/2022   Influenza Split 07/15/2011, 06/26/2014   Influenza Whole 06/22/2009, 06/13/2010, 07/05/2012, 07/06/2018   Influenza, High Dose Seasonal PF 07/06/2018, 07/16/2019   Influenza,inj,quad, With Preservative 07/16/2019   Influenza-Unspecified 06/15/2013, 06/19/2015, 06/29/2016, 07/14/2017, 07/16/2019, 07/11/2020   Moderna Covid-19 Vaccine Bivalent Booster 76yr & up 10/27/2021   PFIZER(Purple Top)SARS-COV-2 Vaccination 11/23/2019, 12/18/2019, 08/01/2020   Pneumococcal Conjugate-13 06/27/2015   Pneumococcal Polysaccharide-23 08/11/2007   Td 07/17/2001   Tdap 09/03/2011   Zoster, Live 06/13/2010    Past Medical History:  Diagnosis Date   Anemia    Colonic polyp    Constipation    COPD (chronic obstructive pulmonary disease) (HCC)    Hyperlipidemia    IBS (irritable bowel syndrome)    Kidney cysts    Lung nodule    Meningioma (HCC)    Nausea    Palpitations    Tonsillar cyst     Tobacco History: Social History   Tobacco Use  Smoking Status  Former   Packs/day: 0.25   Years: 10.00   Total pack years: 2.50   Types: Cigarettes   Quit date: 09/25/1997   Years since quitting: 25.1  Smokeless Tobacco Never   Counseling given: Not Answered   Outpatient Medications Prior to Visit  Medication Sig Dispense Refill   BYSTOLIC 5 MG tablet TAKE 1 TABLET (5 MG TOTAL) BY MOUTH DAILY. MAY TAKE EXTRA 1 TABLET (5 MG )AS NEEDED FOR TACHYCARDIA 100 tablet 3   ergocalciferol (VITAMIN D2) 50000 UNITS capsule Take 50,000 Units by mouth every 30 (thirty)  days.     Fluocinolone Acetonide 0.01 % OIL Apply 1-2 drops up to twice daily to ears as needed for itching/irritation 20 mL 2   ibuprofen (ADVIL,MOTRIN) 600 MG tablet Take 1 tablet (600 mg total) by mouth every 8 (eight) hours as needed. 20 tablet 0   latanoprost (XALATAN) 0.005 % ophthalmic solution SMARTSIG:In Eye(s)     levothyroxine (SYNTHROID, LEVOTHROID) 88 MCG tablet Take 1 tablet 6 days a week.     metroNIDAZOLE (METROCREAM) 0.75 % cream Apply to face once to twice daily for rosacea. 45 g 5   Multiple Vitamin (MULTIVITAMIN PO) Take 1 tablet by mouth daily.     simvastatin (ZOCOR) 20 MG tablet TAKE 1 TABLET BY MOUTH EVERY DAY IN THE EVENING 90 tablet 3   triamcinolone (KENALOG) 0.025 % cream Apply 1 application. topically as directed. Qd to bid aa chest, shoulders until clear then prn flares, avoid face, groin, axilla 30 g 2   No facility-administered medications prior to visit.     Review of Systems:   Constitutional:   No  weight loss, night sweats,  Fevers, chills, fatigue, or  lassitude.  HEENT:   No headaches,  Difficulty swallowing,  Tooth/dental problems, or  Sore throat,                No sneezing, itching, ear ache, nasal congestion, post nasal drip,   CV:  No chest pain,  Orthopnea, PND, swelling in lower extremities, anasarca, dizziness, palpitations, syncope.   GI  No heartburn, indigestion, abdominal pain, nausea, vomiting, diarrhea, change in bowel habits, loss of appetite, bloody stools.   Resp: No shortness of breath with exertion or at rest.  No excess mucus, no productive cough,  No non-productive cough,  No coughing up of blood.  No change in color of mucus.  No wheezing.  No chest wall deformity  Skin: no rash or lesions.  GU: no dysuria, change in color of urine, no urgency or frequency.  No flank pain, no hematuria   MS:  No joint pain or swelling.  No decreased range of motion.  No back pain.    Physical Exam  BP 118/64 (BP Location: Left Arm,  Patient Position: Sitting, Cuff Size: Normal)   Pulse 77   Temp 98.6 F (37 C) (Oral)   Ht '5\' 7"'$  (1.702 m)   Wt 149 lb 3.2 oz (67.7 kg)   SpO2 97%   BMI 23.37 kg/m   GEN: A/Ox3; pleasant , NAD, well nourished    HEENT:  [place]/AT,   NOSE-clear, THROAT-clear, no lesions, no postnasal drip or exudate noted.  Class II MP airway NECK:  Supple w/ fair ROM; no JVD; normal carotid impulses w/o bruits; no thyromegaly or nodules palpated; no lymphadenopathy.    RESP  Clear  P & A; w/o, wheezes/ rales/ or rhonchi. no accessory muscle use, no dullness to percussion  CARD:  RRR, no m/r/g, no  peripheral edema, pulses intact, no cyanosis or clubbing.  GI:   Soft & nt; nml bowel sounds; no organomegaly or masses detected.   Musco: Warm bil, no deformities or joint swelling noted.   Neuro: alert, no focal deficits noted.    Skin: Warm, no lesions or rashes    Lab Results   BMET  No results found for: "BNP"  ProBNP No results found for: "PROBNP"  Imaging:        No data to display          No results found for: "NITRICOXIDE"      Assessment & Plan:   Obstructive sleep apnea Mild obstructive sleep apnea.  Unfortunately she is CPAP intolerant.  Long discussion regarding CPAP and sleep apnea.  Patient wants to try positional sleep.  Recommend that she avoid sleeping in supine position.  May use her adjustable bed to sleep in a more incline.  She does not want referral for oral appliance at this time. Will place order to discontinue CPAP.  Plan  Patient Instructions  May discontinue CPAP .  Healthy sleep regimen  Call back if you change your mind on Oral appliance referral.  Follow up as needed       Rexene Edison, NP 10/26/2022

## 2022-10-26 NOTE — Telephone Encounter (Signed)
Scheduled pt with Tammy Parrett at 3:30 pm today. Nothing further needed at this time.

## 2022-10-26 NOTE — Assessment & Plan Note (Signed)
Mild obstructive sleep apnea.  Unfortunately she is CPAP intolerant.  Long discussion regarding CPAP and sleep apnea.  Patient wants to try positional sleep.  Recommend that she avoid sleeping in supine position.  May use her adjustable bed to sleep in a more incline.  She does not want referral for oral appliance at this time. Will place order to discontinue CPAP.  Plan  Patient Instructions  May discontinue CPAP .  Healthy sleep regimen  Call back if you change your mind on Oral appliance referral.  Follow up as needed

## 2022-10-26 NOTE — Patient Instructions (Signed)
May discontinue CPAP .  Healthy sleep regimen  Call back if you change your mind on Oral appliance referral.  Follow up as needed

## 2022-10-26 NOTE — Telephone Encounter (Signed)
Pt called stating that she was tried on cpap but states that it is not working due to her deviated septum. The other alternative to the nasal mask was the full face mask which she said she cannot do due to feeling like she is being smothered.   Pt said that TP mentioned that there were other alternatives to the cpap and she wants to know if she might be able to do one of those alternatives and turn CPAP back into [employer] due to it not working for her.  Tammy, please advise on this for pt.

## 2022-10-26 NOTE — Telephone Encounter (Signed)
Needs office visit to sort out

## 2022-10-27 NOTE — Progress Notes (Signed)
Reviewed and agree with assessment/plan.   Chandrika Sandles, MD [facility] 10/27/2022, 7:11 PM Pager:  [phone]  

## 2022-10-29 ENCOUNTER — Ambulatory Visit (INDEPENDENT_AMBULATORY_CARE_PROVIDER_SITE_OTHER): Payer: Medicare Other | Admitting: Dermatology

## 2022-10-29 DIAGNOSIS — Z79899 Other long term (current) drug therapy: Secondary | ICD-10-CM

## 2022-10-29 DIAGNOSIS — L719 Rosacea, unspecified: Secondary | ICD-10-CM

## 2022-10-29 MED ORDER — DOXYCYCLINE HYCLATE 50 MG PO CAPS: 50.0000 mg | ORAL_CAPSULE | Freq: Every day | ORAL | 1 refills | Status: DC

## 2022-10-29 NOTE — Progress Notes (Signed)
   Follow-Up Visit   Subjective  Meghan Mack is a 72 y.o. female who presents for the following: Other (Break out of face x 10 days off and on).  The following portions of the chart were reviewed this encounter and updated as appropriate:   Tobacco  Allergies  Meds  Problems  Med Hx  Surg Hx  Fam Hx     Review of Systems:  No other skin or systemic complaints except as noted in HPI or Assessment and Plan.  Objective  Well appearing patient in no apparent distress; mood and affect are within normal limits.  A focused examination was performed including face. Relevant physical exam findings are noted in the Assessment and Plan.  Face Pink papules   Assessment & Plan  Rosacea Face Chronic and persistent condition with duration or expected duration over one year. Condition is bothersome/symptomatic for patient. Currently flared. Rosacea is a chronic progressive skin condition usually affecting the face of adults, causing redness and/or acne bumps. It is treatable but not curable. It sometimes affects the eyes (ocular rosacea) as well. It may respond to topical and/or systemic medication and can flare with stress, sun exposure, alcohol, exercise, topical steroids (including hydrocortisone/cortisone 10) and some foods.  Daily application of broad spectrum spf 30+ sunscreen to face is recommended to reduce flares.  Start doxycycline 50 mg 1 po qd with food and plenty of fluid.  Restart Metrocream qd-bid  doxycycline (VIBRAMYCIN) 50 MG capsule - Face Take 1 capsule (50 mg total) by mouth daily. Related Medications metroNIDAZOLE (METROCREAM) 0.75 % cream Apply to face once to twice daily for rosacea.  Return for Follow up as scheduled.  I, Ashok Cordia, CMA, am acting as scribe for Sarina Ser, MD . Documentation: I have reviewed the above documentation for accuracy and completeness, and I agree with the above.  Sarina Ser, MD

## 2022-10-29 NOTE — Patient Instructions (Signed)
Rosacea is a chronic progressive skin condition usually affecting the face of adults, causing redness and/or acne bumps. It is treatable but not curable. It sometimes affects the eyes (ocular rosacea) as well. It may respond to topical and/or systemic medication and can flare with stress, sun exposure, alcohol, exercise, topical steroids (including hydrocortisone/cortisone 10) and some foods.  Daily application of broad spectrum spf 30+ sunscreen to face is recommended to reduce flares.     Due to recent changes in healthcare laws, you may see results of your pathology and/or laboratory studies on MyChart before the doctors have had a chance to review them. We understand that in some cases there may be results that are confusing or concerning to you. Please understand that not all results are received at the same time and often the doctors may need to interpret multiple results in order to provide you with the best plan of care or course of treatment. Therefore, we ask that you please give [place] 2 business days to thoroughly review all your results before contacting the office for clarification. Should we see a critical lab result, you will be contacted sooner.   If You Need Anything After Your Visit  If you have any questions or concerns for your doctor, please call our main line at [phone] and press option 4 to reach your doctor's medical assistant. If no one answers, please leave a voicemail as directed and we will return your call as soon as possible. Messages left after 4 pm will be answered the following business day.   You may also send [place] a message via [place]. We typically respond to MyChart messages within 1-2 business days.  For prescription refills, please ask your pharmacy to contact our office. Our fax number is [phone].  If you have an urgent issue when the clinic is closed that cannot wait until the next business day, you can page your doctor at the number below.    Please  note that while we do our best to be available for urgent issues outside of office hours, we are not available 24/7.   If you have an urgent issue and are unable to reach [place], you may choose to seek medical care at your doctor's office, retail clinic, urgent care center, or emergency room.  If you have a medical emergency, please immediately call 911 or go to the emergency department.  Pager Numbers  - Dr. Nehemiah Massed: [phone]  - Dr. Laurence Ferrari: [phone]  - Dr. Nicole Kindred: [phone]  In the event of inclement weather, please call our main line at [phone] for an update on the status of any delays or closures.  Dermatology Medication Tips: Please keep the boxes that topical medications come in in order to help keep track of the instructions about where and how to use these. Pharmacies typically print the medication instructions only on the boxes and not directly on the medication tubes.   If your medication is too expensive, please contact our office at [phone] option 4 or send [place] a message through [place].   We are unable to tell what your co-pay for medications will be in advance as this is different depending on your insurance coverage. However, we may be able to find a substitute medication at lower cost or fill out paperwork to get insurance to cover a needed medication.   If a prior authorization is required to get your medication covered by your insurance company, please allow [place] 1-2 business days to complete this process.  Drug  prices often vary depending on where the prescription is filled and some pharmacies may offer cheaper prices.  The website www.goodrx.com contains coupons for medications through different pharmacies. The prices here do not account for what the cost may be with help from insurance (it may be cheaper with your insurance), but the website can give you the price if you did not use any insurance.  - You can print the associated coupon and take it with  your prescription to the pharmacy.  - You may also stop by our office during regular business hours and pick up a GoodRx coupon card.  - If you need your prescription sent electronically to a different pharmacy, notify our office through [facility] or by phone at [phone] option 4.     Si Usted Necesita Algo Despus de Su Visita  Tambin puede enviarnos un mensaje a travs de Pharmacist, community. Por lo general respondemos a los mensajes de MyChart en el transcurso de 1 a 2 das hbiles.  Para renovar recetas, por favor pida a su farmacia que se ponga en contacto con nuestra oficina. Harland Dingwall de fax es [place] [phone].  Si tiene un asunto urgente cuando la clnica est cerrada y que no puede esperar hasta el siguiente da hbil, puede llamar/localizar a su doctor(a) al nmero que aparece a continuacin.   Por favor, tenga en cuenta que aunque hacemos todo lo posible para estar disponibles para asuntos urgentes fuera del horario de [place], no estamos disponibles las 24 horas del da, los 7 das de la [place].   Si tiene un problema urgente y no puede comunicarse con nosotros, puede optar por buscar atencin mdica  en el consultorio de su doctor(a), en una clnica privada, en un centro de atencin urgente o en una sala de emergencias.  Si tiene Engineering geologist, por favor llame inmediatamente al 911 o vaya a la sala de emergencias.  Nmeros de bper  - Dr. Nehemiah Massed: [phone]  - Dra. Moye: [phone]  - Dra. Nicole Kindred: [phone]  En caso de inclemencias del [place], por favor llame a Johnsie Kindred principal al [phone] para una actualizacin sobre el [place] de cualquier retraso o cierre.  Consejos para la medicacin en dermatologa: Por favor, guarde las cajas en las que vienen los medicamentos de uso tpico para ayudarle a seguir las instrucciones sobre dnde y cmo usarlos. Las farmacias generalmente imprimen las instrucciones del medicamento slo en las cajas y  no directamente en los tubos del [place].   Si su medicamento es muy caro, por favor, pngase en contacto con Zigmund Daniel llamando al [phone] y presione la opcin 4 o envenos un mensaje a travs de Pharmacist, community.   No podemos decirle cul ser su copago por los medicamentos por adelantado ya que esto es diferente dependiendo de la cobertura de su seguro. Sin embargo, es posible que podamos encontrar un medicamento sustituto a Electrical engineer un formulario para que el seguro cubra el medicamento que se considera necesario.   Si se requiere una autorizacin previa para que su compaa de seguros [place] su medicamento, por favor permtanos de 1 a 2 das hbiles para completar este proceso.  Los precios de los medicamentos varan con frecuencia dependiendo del Environmental consultant de dnde se surte la receta y alguna farmacias pueden ofrecer precios ms baratos.  El sitio web www.goodrx.com tiene cupones para medicamentos de Airline pilot. Los precios aqu no tienen en cuenta lo que podra costar con la ayuda del seguro (puede ser ms barato  con su seguro), pero el sitio web puede darle el precio si no Field seismologist.  - Puede imprimir el cupn correspondiente y llevarlo con su receta a la farmacia.  - Tambin puede pasar por nuestra oficina durante el horario de atencin regular y Charity fundraiser una tarjeta de cupones de GoodRx.  - Si necesita que su receta se enve electrnicamente a una farmacia diferente, informe a nuestra oficina a travs de MyChart de [place] o por telfono llamando al [phone] y presione la opcin 4.

## 2022-10-31 ENCOUNTER — Ambulatory Visit (INDEPENDENT_AMBULATORY_CARE_PROVIDER_SITE_OTHER): Payer: Medicare Other

## 2022-10-31 DIAGNOSIS — R200 Anesthesia of skin: Secondary | ICD-10-CM

## 2022-11-01 ENCOUNTER — Telehealth: Payer: Self-pay | Admitting: Family

## 2022-11-01 DIAGNOSIS — F40240 Claustrophobia: Secondary | ICD-10-CM

## 2022-11-01 DIAGNOSIS — G8929 Other chronic pain: Secondary | ICD-10-CM

## 2022-11-01 NOTE — Telephone Encounter (Signed)
Pt called in staying that she usually goes to [place] to get imaging done but, she just found out that they have a location here in [place] and she would like for Dr. Vidal Schwalbe to send her referrals there which its closer to her.

## 2022-11-01 NOTE — Telephone Encounter (Signed)
Patient called and said that if her ultra sound is good, does Joycelyn Schmid still want to do a MRI?

## 2022-11-02 NOTE — Telephone Encounter (Signed)
Sent result note

## 2022-11-05 ENCOUNTER — Other Ambulatory Visit: Payer: Self-pay

## 2022-11-05 ENCOUNTER — Ambulatory Visit (INDEPENDENT_AMBULATORY_CARE_PROVIDER_SITE_OTHER): Payer: Medicare Other | Admitting: Dermatology

## 2022-11-05 VITALS — BP 131/84 | HR 92

## 2022-11-05 DIAGNOSIS — R238 Other skin changes: Secondary | ICD-10-CM | POA: Diagnosis not present

## 2022-11-05 DIAGNOSIS — L738 Other specified follicular disorders: Secondary | ICD-10-CM

## 2022-11-05 DIAGNOSIS — L578 Other skin changes due to chronic exposure to nonionizing radiation: Secondary | ICD-10-CM

## 2022-11-05 DIAGNOSIS — L814 Other melanin hyperpigmentation: Secondary | ICD-10-CM

## 2022-11-05 DIAGNOSIS — L719 Rosacea, unspecified: Secondary | ICD-10-CM

## 2022-11-05 DIAGNOSIS — D225 Melanocytic nevi of trunk: Secondary | ICD-10-CM | POA: Diagnosis not present

## 2022-11-05 DIAGNOSIS — L918 Other hypertrophic disorders of the skin: Secondary | ICD-10-CM

## 2022-11-05 DIAGNOSIS — L821 Other seborrheic keratosis: Secondary | ICD-10-CM

## 2022-11-05 DIAGNOSIS — Z1283 Encounter for screening for malignant neoplasm of skin: Secondary | ICD-10-CM

## 2022-11-05 DIAGNOSIS — I8310 Varicose veins of unspecified lower extremity with inflammation: Secondary | ICD-10-CM | POA: Diagnosis not present

## 2022-11-05 MED ORDER — METRONIDAZOLE 0.75 % EX CREA: TOPICAL_CREAM | CUTANEOUS | 5 refills | Status: DC

## 2022-11-05 NOTE — Patient Instructions (Signed)
Due to recent changes in healthcare laws, you may see results of your pathology and/or laboratory studies on MyChart before the doctors have had a chance to review them. We understand that in some cases there may be results that are confusing or concerning to you. Please understand that not all results are received at the same time and often the doctors may need to interpret multiple results in order to provide you with the best plan of care or course of treatment. Therefore, we ask that you please give us 2 business days to thoroughly review all your results before contacting the office for clarification. Should we see a critical lab result, you will be contacted sooner.   If You Need Anything After Your Visit  If you have any questions or concerns for your doctor, please call our main line at [phone] and press option 4 to reach your doctor's medical assistant. If no one answers, please leave a voicemail as directed and we will return your call as soon as possible. Messages left after 4 pm will be answered the following business day.   You may also send us a message via MyChart. We typically respond to MyChart messages within 1-2 business days.  For prescription refills, please ask your pharmacy to contact our office. Our fax number is [phone].  If you have an urgent issue when the clinic is closed that cannot wait until the next business day, you can page your doctor at the number below.    Please note that while we do our best to be available for urgent issues outside of office hours, we are not available 24/7.   If you have an urgent issue and are unable to reach us, you may choose to seek medical care at your doctor's office, retail clinic, urgent care center, or emergency room.  If you have a medical emergency, please immediately call 911 or go to the emergency department.  Pager Numbers  - Dr. Kowalski: [phone]  - Dr. Moye: [phone]  - Dr. Stewart:  [phone]  In the event of inclement weather, please call our main line at [phone] for an update on the status of any delays or closures.  Dermatology Medication Tips: Please keep the boxes that topical medications come in in order to help keep track of the instructions about where and how to use these. Pharmacies typically print the medication instructions only on the boxes and not directly on the medication tubes.   If your medication is too expensive, please contact our office at [phone] option 4 or send us a message through MyChart.   We are unable to tell what your co-pay for medications will be in advance as this is different depending on your insurance coverage. However, we may be able to find a substitute medication at lower cost or fill out paperwork to get insurance to cover a needed medication.   If a prior authorization is required to get your medication covered by your insurance company, please allow us 1-2 business days to complete this process.  Drug prices often vary depending on where the prescription is filled and some pharmacies may offer cheaper prices.  The website www.goodrx.com contains coupons for medications through different pharmacies. The prices here do not account for what the cost may be with help from insurance (it may be cheaper with your insurance), but the website can give you the price if you did not use any insurance.  - You can print the associated coupon and take it with   your prescription to the pharmacy.  - You may also stop by our office during regular business hours and pick up a GoodRx coupon card.  - If you need your prescription sent electronically to a different pharmacy, notify our office through [place] MyChart or by phone at [phone] option 4.     Si Usted Necesita Algo Despus de Su Visita  Tambin puede enviarnos un mensaje a travs de MyChart. Por lo general respondemos a los mensajes de MyChart en el transcurso de 1 a 2  das hbiles.  Para renovar recetas, por favor pida a su farmacia que se ponga en contacto con nuestra oficina. Nuestro nmero de fax es el [phone].  Si tiene un asunto urgente cuando la clnica est cerrada y que no puede esperar hasta el siguiente da hbil, puede llamar/localizar a su doctor(a) al nmero que aparece a continuacin.   Por favor, tenga en cuenta que aunque hacemos todo lo posible para estar disponibles para asuntos urgentes fuera del horario de oficina, no estamos disponibles las 24 horas del da, los 7 das de la semana.   Si tiene un problema urgente y no puede comunicarse con nosotros, puede optar por buscar atencin mdica  en el consultorio de su doctor(a), en una clnica privada, en un centro de atencin urgente o en una sala de emergencias.  Si tiene una emergencia mdica, por favor llame inmediatamente al 911 o vaya a la sala de emergencias.  Nmeros de bper  - Dr. Kowalski: [phone]  - Dra. Moye: [phone]  - Dra. Stewart: [phone]  En caso de inclemencias del tiempo, por favor llame a nuestra lnea principal al [phone] para una actualizacin sobre el estado de cualquier retraso o cierre.  Consejos para la medicacin en dermatologa: Por favor, guarde las cajas en las que vienen los medicamentos de uso tpico para ayudarle a seguir las instrucciones sobre dnde y cmo usarlos. Las farmacias generalmente imprimen las instrucciones del medicamento slo en las cajas y no directamente en los tubos del medicamento.   Si su medicamento es muy caro, por favor, pngase en contacto con nuestra oficina llamando al [phone] y presione la opcin 4 o envenos un mensaje a travs de MyChart.   No podemos decirle cul ser su copago por los medicamentos por adelantado ya que esto es diferente dependiendo de la cobertura de su seguro. Sin embargo, es posible que podamos encontrar un medicamento sustituto a menor costo o llenar un formulario para que el  seguro cubra el medicamento que se considera necesario.   Si se requiere una autorizacin previa para que su compaa de seguros cubra su medicamento, por favor permtanos de 1 a 2 das hbiles para completar este proceso.  Los precios de los medicamentos varan con frecuencia dependiendo del lugar de dnde se surte la receta y alguna farmacias pueden ofrecer precios ms baratos.  El sitio web www.goodrx.com tiene cupones para medicamentos de diferentes farmacias. Los precios aqu no tienen en cuenta lo que podra costar con la ayuda del seguro (puede ser ms barato con su seguro), pero el sitio web puede darle el precio si no utiliz ningn seguro.  - Puede imprimir el cupn correspondiente y llevarlo con su receta a la farmacia.  - Tambin puede pasar por nuestra oficina durante el horario de atencin regular y recoger una tarjeta de cupones de GoodRx.  - Si necesita que su receta se enve electrnicamente a una farmacia diferente, informe a nuestra oficina a travs de MyChart de [place]   o por telfono llamando al [phone] y presione la opcin 4.  

## 2022-11-05 NOTE — Progress Notes (Signed)
Follow-Up Visit   Subjective  Meghan Mack is a 72 y.o. female who presents for the following: Total body skin exam (No hx of skin cancer) and Rosacea (Face, Metronidazole 0.75% cr qhs, Doxycycline '50mg'$  1 po qd).  The patient presents for Total-Body Skin Exam (TBSE) for skin cancer screening and mole check.  The patient has spots, moles and lesions to be evaluated, some may be new or changing and the patient has concerns that these could be cancer.   The following portions of the chart were reviewed this encounter and updated as appropriate:       Review of Systems:  No other skin or systemic complaints except as noted in HPI or Assessment and Plan.  Objective  Well appearing patient in no apparent distress; mood and affect are within normal limits.  A full examination was performed including scalp, head, eyes, ears, nose, lips, neck, chest, axillae, abdomen, back, buttocks, bilateral upper extremities, bilateral lower extremities, hands, feet, fingers, toes, fingernails, and toenails. All findings within normal limits unless otherwise noted below.  face Erythema R nasal labial cheek, L chin, R chin  R lower lip Soft blue pap 3.52m    Assessment & Plan  Rosacea face  Chronic and persistent condition with duration or expected duration over one year. Condition is symptomatic/ bothersome to patient. Not currently at goal.   Rosacea is a chronic progressive skin condition usually affecting the face of adults, causing redness and/or acne bumps. It is treatable but not curable. It sometimes affects the eyes (ocular rosacea) as well. It may respond to topical and/or systemic medication and can flare with stress, sun exposure, alcohol, exercise, topical steroids (including hydrocortisone/cortisone 10) and some foods.  Daily application of broad spectrum spf 30+ sunscreen to face is recommended to reduce flares.  Cont Doxycycline '50mg'$  1 po qd for 117mthen prn flares Cont  Metronidazole 0.75% cream qhs  Doxycycline should be taken with food to prevent nausea. Do not lay down for 30 minutes after taking. Be cautious with sun exposure and use good sun protection while on this medication. Pregnant women should not take this medication.    Related Medications metroNIDAZOLE (METROCREAM) 0.75 % cream Apply to face once to twice daily for rosacea.  doxycycline (VIBRAMYCIN) 50 MG capsule Take 1 capsule (50 mg total) by mouth daily.  Venous lake R lower lip  Benign, observe.      Lentigines - Scattered tan macules - Due to sun exposure - Benign-appearing, observe - Recommend daily broad spectrum sunscreen SPF 30+ to sun-exposed areas, reapply every 2 hours as needed. - Call for any changes - back, arms  Seborrheic Keratoses - Stuck-on, waxy, tan-brown papules and/or plaques  - Benign-appearing - Discussed benign etiology and prognosis. - Observe - Call for any changes - face, legs  Sebaceous Hyperplasia - Small yellow papules with a central dell - Benign - Observe  - forehead  Melanocytic Nevi - Tan-brown and/or pink-flesh-colored symmetric macules and papules - Benign appearing on exam today - Observation - Call clinic for new or changing moles - Recommend daily use of broad spectrum spf 30+ sunscreen to sun-exposed areas.  - abdomen  Hemangiomas - Red papules - Discussed benign nature - Observe - Call for any changes - back, chest, abdomen  Actinic Damage - Chronic condition, secondary to cumulative UV/sun exposure - diffuse scaly erythematous macules with underlying dyspigmentation - Recommend daily broad spectrum sunscreen SPF 30+ to sun-exposed areas, reapply every 2 hours as needed.  -  Staying in the shade or wearing long sleeves, sun glasses (UVA+UVB protection) and wide brim hats (4-inch brim around the entire circumference of the hat) are also recommended for sun protection.  - Call for new or changing lesions. -  chest  Acrochordons (Skin Tags) - Fleshy, skin-colored pedunculated papules - Benign appearing.  - Observe. - If desired, they can be removed with an in office procedure that is not covered by insurance. - Please call the clinic if you notice any new or changing lesions.  - abdomen  Varicose Veins/Spider Veins - Dilated blue, purple or red veins at the lower extremities - Reassured - Smaller vessels can be treated by sclerotherapy (a procedure to inject a medicine into the veins to make them disappear) if desired, but the treatment is not covered by insurance. Larger vessels may be covered if symptomatic and we would refer to vascular surgeon if treatment desired.  - legs  Skin cancer screening performed today.   Return in about 3 months (around 02/04/2023) for Rosacea f/u, 1 yr TBSE.  I, Othelia Pulling, RMA, am acting as scribe for Brendolyn Patty, MD .  Documentation: I have reviewed the above documentation for accuracy and completeness, and I agree with the above.  Brendolyn Patty MD

## 2022-11-07 ENCOUNTER — Encounter: Payer: Self-pay | Admitting: Dermatology

## 2022-11-08 NOTE — Telephone Encounter (Signed)
Pt is calling about previous note

## 2022-11-13 ENCOUNTER — Other Ambulatory Visit: Payer: Self-pay | Admitting: Dermatology

## 2022-11-13 ENCOUNTER — Ambulatory Visit (INDEPENDENT_AMBULATORY_CARE_PROVIDER_SITE_OTHER): Payer: Medicare Other | Admitting: Dermatology

## 2022-11-13 VITALS — BP 139/83 | HR 71

## 2022-11-13 DIAGNOSIS — L719 Rosacea, unspecified: Secondary | ICD-10-CM

## 2022-11-13 MED ORDER — IVERMECTIN 1 % EX CREA: TOPICAL_CREAM | CUTANEOUS | 2 refills | Status: DC

## 2022-11-13 MED ORDER — DOXYCYCLINE HYCLATE 100 MG PO CAPS: 100.0000 mg | ORAL_CAPSULE | Freq: Every day | ORAL | 2 refills | Status: DC

## 2022-11-13 NOTE — Progress Notes (Signed)
   Follow-Up Visit   Subjective  Meghan Mack is a 72 y.o. female who presents for the following: Rash.  Patient presents today for rosacea of the face. When patient woke up this morning she had a cluster of blisters on her right cheek. No tingling, burning, or sensation in this area. No new food last night, nothing spicy. Bumps have gone down a little now, but a new bump came up on her right temple. She is using metronidazole 0.75% twice daily and doxycycline 50 MG daily.   The following portions of the chart were reviewed this encounter and updated as appropriate:       Review of Systems:  No other skin or systemic complaints except as noted in HPI or Assessment and Plan.  Objective  Well appearing patient in no apparent distress; mood and affect are within normal limits.  A focused examination was performed including face. Relevant physical exam findings are noted in the Assessment and Plan.  face Erythematous papules of the right zygoma and right malar cheek; erythema of the right nasolabial fold.  No vesicles, no pain    Assessment & Plan  Rosacea face  Chronic and persistent condition with duration or expected duration over one year. Condition is symptomatic / bothersome to patient. Not to goal and with recent flare.  Rosacea is a chronic progressive skin condition usually affecting the face of adults, causing redness and/or acne bumps. It is treatable but not curable. It sometimes affects the eyes (ocular rosacea) as well. It may respond to topical and/or systemic medication and can flare with stress, sun exposure, alcohol, exercise, topical steroids (including hydrocortisone/cortisone 10) and some foods.  Daily application of broad spectrum spf 30+ sunscreen to face is recommended to reduce flares.  Start Soolantra Cream Apply to face Qhs dsp 45g 2Rf Continue metronidazole 0.75% cream Apply to face QD. Increase doxycycline 50 MG 2 po QD with food. Patient has  refill. Rx on hold, doxycycline 100 MG take 1 po QD with food dsp #30 2Rf.  Doxycycline should be taken with food to prevent nausea. Do not lay down for 30 minutes after taking. Be cautious with sun exposure and use good sun protection while on this medication. Pregnant women should not take this medication.   Reviewed signs of shingles with patient. She will return to office if she develops any pain or blisters with tingling or burning sensation.    doxycycline (VIBRAMYCIN) 100 MG capsule - face Take 1 capsule (100 mg total) by mouth daily. Take with food.  Ivermectin (SOOLANTRA) 1 % CREA - face Apply to face once a day for rosacea.  Related Medications doxycycline (VIBRAMYCIN) 50 MG capsule Take 1 capsule (50 mg total) by mouth daily.  metroNIDAZOLE (METROCREAM) 0.75 % cream Apply to face once to twice daily for rosacea.   Return as scheduled, for rosacea.  Documentation: I have reviewed the above documentation for accuracy and completeness, and I agree with the above.  Brendolyn Patty MD

## 2022-11-13 NOTE — Telephone Encounter (Signed)
Spoke to pt and she stated that she would like to go forward with the MRI but since they have an office here in [place] she prefers to go there instead of going to [facility]

## 2022-11-13 NOTE — Telephone Encounter (Signed)
Call pt  Please confirm that she would like to move forward with MRI lumbar spine  Please confirm NO pacemaker OR metal in her body.  Let me know if claustrophobic so I can send in medication for her to take prior to.   Please let her know from here we will place the order in our referral whom reaching out to her to schedule in the next week

## 2022-11-13 NOTE — Patient Instructions (Addendum)
Start Soolantra Cream Apply to face once a day. Continue metronidazole 0.75% cream Apply to face once a day. Increase doxycycline 50 MG take 2 capsules by mouth daily. Doxycycline should be taken with food to prevent nausea. Do not lay down for 30 minutes after taking. Be cautious with sun exposure and use good sun protection while on this medication. Pregnant women should not take this medication.     Rosacea  What is rosacea? Rosacea (say: ro-zay-sha) is a common skin disease that usually begins as a trend of flushing or blushing easily.  As rosacea progresses, a persistent redness in the center of the face will develop and may gradually spread beyond the nose and cheeks to the forehead and chin.  In some cases, the ears, chest, and back could be affected.  Rosacea may appear as tiny blood vessels or small red bumps that occur in crops.  Frequently they can contain pus, and are called "pustules".  If the bumps do not contain pus, they are referred to as "papules".  Rarely, in prolonged, untreated cases of rosacea, the oil glands of the nose and cheeks may become permanently enlarged.  This is called rhinophyma, and is seen more frequently in men.  Signs and Risks In its beginning stages, rosacea tends to come and go, which makes it difficult to recognize.  It can start as intermittent flushing of the face.  Eventually, blood vessels may become permanently visible.  Pustules and papules can appear, but can be mistaken for adult acne.  People of all races, ages, genders and ethnic groups are at risk of developing rosacea.  However, it is more common in women (especially around menopause) and adults with fair skin between the ages of 32 and 25.  Treatment Dermatologists typically recommend a combination of treatments to effectively manage rosacea.  Treatment can improve symptoms and may stop the progression of the rosacea.  Treatment may involve both topical and oral medications.  The tetracycline  antibiotics are often used for their anti-inflammatory effect; however, because of the possibility of developing antibiotic resistance, they should not be used long term at full dose.  For dilated blood vessels the options include electrodessication (uses electric current through a small needle), laser treatment, and cosmetics to hide the redness.   With all forms of treatment, improvement is a slow process, and patients may not see any results for the first 3-4 weeks.  It is very important to avoid the sun and other triggers.  Patients must wear sunscreen daily.  Skin Care Instructions: Cleanse the skin with a mild soap such as CeraVe cleanser, Cetaphil cleanser, or Dove soap once or twice daily as needed. Moisturize with Eucerin Redness Relief Daily Perfecting Lotion (has a subtle green tint), CeraVe Moisturizing Cream, or Oil of Olay Daily Moisturizer with sunscreen every morning and/or night as recommended. Makeup should be "non-comedogenic" (won't clog pores) and be labeled "for sensitive skin". Good choices for cosmetics are: Neutrogena, Almay, and Physician's Formula.  Any product with a green tint tends to offset a red complexion. If your eyes are dry and irritated, use artificial tears 2-3 times per day and cleanse the eyelids daily with baby shampoo.  Have your eyes examined at least every 2 years.  Be sure to tell your eye doctor that you have rosacea. Alcoholic beverages tend to cause flushing of the skin, and may make rosacea worse. Always wear sunscreen, protect your skin from extreme hot and cold temperatures, and avoid spicy foods, hot drinks, and  mechanical irritation such as rubbing, scrubbing, or massaging the face.  Avoid harsh skin cleansers, cleansing masks, astringents, and exfoliation. If a particular product burns or makes your face feel tight, then it is likely to flare your rosacea. If you are having difficulty finding a sunscreen that you can tolerate, you may try switching to a  chemical-free sunscreen.  These are ones whose active ingredient is zinc oxide or titanium dioxide only.  They should also be fragrance free, non-comedogenic, and labeled for sensitive skin. Rosacea triggers may vary from person to person.  There are a variety of foods that have been reported to trigger rosacea.  Some patients find that keeping a diary of what they were doing when they flared helps them avoid triggers.   Due to recent changes in healthcare laws, you may see results of your pathology and/or laboratory studies on MyChart before the doctors have had a chance to review them. We understand that in some cases there may be results that are confusing or concerning to you. Please understand that not all results are received at the same time and often the doctors may need to interpret multiple results in order to provide you with the best plan of care or course of treatment. Therefore, we ask that you please give [place] 2 business days to thoroughly review all your results before contacting the office for clarification. Should we see a critical lab result, you will be contacted sooner.   If You Need Anything After Your Visit  If you have any questions or concerns for your doctor, please call our main line at [phone] and press option 4 to reach your doctor's medical assistant. If no one answers, please leave a voicemail as directed and we will return your call as soon as possible. Messages left after 4 pm will be answered the following business day.   You may also send [place] a message via [place]. We typically respond to MyChart messages within 1-2 business days.  For prescription refills, please ask your pharmacy to contact our office. Our fax number is [phone].  If you have an urgent issue when the clinic is closed that cannot wait until the next business day, you can page your doctor at the number below.    Please note that while we do our best to be available for urgent issues outside  of office hours, we are not available 24/7.   If you have an urgent issue and are unable to reach [place], you may choose to seek medical care at your doctor's office, retail clinic, urgent care center, or emergency room.  If you have a medical emergency, please immediately call 911 or go to the emergency department.  Pager Numbers  - Dr. Nehemiah Massed: [phone]  - Dr. Laurence Ferrari: [phone]  - Dr. Nicole Kindred: [phone]  In the event of inclement weather, please call our main line at [phone] for an update on the status of any delays or closures.  Dermatology Medication Tips: Please keep the boxes that topical medications come in in order to help keep track of the instructions about where and how to use these. Pharmacies typically print the medication instructions only on the boxes and not directly on the medication tubes.   If your medication is too expensive, please contact our office at [phone] option 4 or send [place] a message through [place].   We are unable to tell what your co-pay for medications will be in advance as this is different depending on your insurance coverage. However,  we may be able to find a substitute medication at lower cost or fill out paperwork to get insurance to cover a needed medication.   If a prior authorization is required to get your medication covered by your insurance company, please allow [place] 1-2 business days to complete this process.  Drug prices often vary depending on where the prescription is filled and some pharmacies may offer cheaper prices.  The website www.goodrx.com contains coupons for medications through different pharmacies. The prices here do not account for what the cost may be with help from insurance (it may be cheaper with your insurance), but the website can give you the price if you did not use any insurance.  - You can print the associated coupon and take it with your prescription to the pharmacy.  - You may also stop by our office  during regular business hours and pick up a GoodRx coupon card.  - If you need your prescription sent electronically to a different pharmacy, notify our office through [facility] or by phone at [phone] option 4.     Si Usted Necesita Algo Despus de Su Visita  Tambin puede enviarnos un mensaje a travs de Pharmacist, community. Por lo general respondemos a los mensajes de MyChart en el transcurso de 1 a 2 das hbiles.  Para renovar recetas, por favor pida a su farmacia que se ponga en contacto con nuestra oficina. Harland Dingwall de fax es [place] [phone].  Si tiene un asunto urgente cuando la clnica est cerrada y que no puede esperar hasta el siguiente da hbil, puede llamar/localizar a su doctor(a) al nmero que aparece a continuacin.   Por favor, tenga en cuenta que aunque hacemos todo lo posible para estar disponibles para asuntos urgentes fuera del horario de [place], no estamos disponibles las 24 horas del da, los 7 das de la [place].   Si tiene un problema urgente y no puede comunicarse con nosotros, puede optar por buscar atencin mdica  en el consultorio de su doctor(a), en una clnica privada, en un centro de atencin urgente o en una sala de emergencias.  Si tiene Engineering geologist, por favor llame inmediatamente al 911 o vaya a la sala de emergencias.  Nmeros de bper  - Dr. Nehemiah Massed: [phone]  - Dra. Moye: [phone]  - Dra. Nicole Kindred: [phone]  En caso de inclemencias del [place], por favor llame a Johnsie Kindred principal al [phone] para una actualizacin sobre el [place] de cualquier retraso o cierre.  Consejos para la medicacin en dermatologa: Por favor, guarde las cajas en las que vienen los medicamentos de uso tpico para ayudarle a seguir las instrucciones sobre dnde y cmo usarlos. Las farmacias generalmente imprimen las instrucciones del medicamento slo en las cajas y no directamente en los tubos del [place].   Si su medicamento es  muy caro, por favor, pngase en contacto con Zigmund Daniel llamando al [phone] y presione la opcin 4 o envenos un mensaje a travs de Pharmacist, community.   No podemos decirle cul ser su copago por los medicamentos por adelantado ya que esto es diferente dependiendo de la cobertura de su seguro. Sin embargo, es posible que podamos encontrar un medicamento sustituto a Electrical engineer un formulario para que el seguro cubra el medicamento que se considera necesario.   Si se requiere una autorizacin previa para que su compaa de seguros [place] su medicamento, por favor permtanos de 1 a 2 das hbiles para completar este proceso.  Los precios de los  medicamentos varan con frecuencia dependiendo del lugar de dnde se surte la receta y alguna farmacias pueden ofrecer precios ms baratos.  El sitio web www.goodrx.com tiene cupones para medicamentos de Airline pilot. Los precios aqu no tienen en cuenta lo que podra costar con la ayuda del seguro (puede ser ms barato con su seguro), pero el sitio web puede darle el precio si no utiliz Research scientist (physical sciences).  - Puede imprimir el cupn correspondiente y llevarlo con su receta a la farmacia.  - Tambin puede pasar por nuestra oficina durante el horario de atencin regular y Charity fundraiser una tarjeta de cupones de GoodRx.  - Si necesita que su receta se enve electrnicamente a una farmacia diferente, informe a nuestra oficina a travs de MyChart de [place] o por telfono llamando al [phone] y presione la opcin 4.

## 2022-11-13 NOTE — Telephone Encounter (Signed)
Patient called and stated this is the  3rd time calling, no one has called her about the note below. Please call.

## 2022-11-14 ENCOUNTER — Other Ambulatory Visit: Payer: Self-pay

## 2022-11-14 ENCOUNTER — Encounter: Payer: Self-pay | Admitting: Dermatology

## 2022-11-14 ENCOUNTER — Ambulatory Visit (INDEPENDENT_AMBULATORY_CARE_PROVIDER_SITE_OTHER): Payer: Medicare Other | Admitting: Dermatology

## 2022-11-14 VITALS — BP 139/90 | HR 86

## 2022-11-14 DIAGNOSIS — L719 Rosacea, unspecified: Secondary | ICD-10-CM

## 2022-11-14 DIAGNOSIS — R21 Rash and other nonspecific skin eruption: Secondary | ICD-10-CM | POA: Diagnosis not present

## 2022-11-14 MED ORDER — DOXYCYCLINE HYCLATE 50 MG PO CAPS: 100.0000 mg | ORAL_CAPSULE | Freq: Every day | ORAL | 2 refills | Status: DC

## 2022-11-14 MED ORDER — VALACYCLOVIR HCL 1 G PO TABS: 1000.0000 mg | ORAL_TABLET | Freq: Three times a day (TID) | ORAL | 0 refills | Status: AC

## 2022-11-14 NOTE — Patient Instructions (Signed)
Due to recent changes in healthcare laws, you may see results of your pathology and/or laboratory studies on MyChart before the doctors have had a chance to review them. We understand that in some cases there may be results that are confusing or concerning to you. Please understand that not all results are received at the same time and often the doctors may need to interpret multiple results in order to provide you with the best plan of care or course of treatment. Therefore, we ask that you please give us 2 business days to thoroughly review all your results before contacting the office for clarification. Should we see a critical lab result, you will be contacted sooner.   If You Need Anything After Your Visit  If you have any questions or concerns for your doctor, please call our main line at [phone] and press option 4 to reach your doctor's medical assistant. If no one answers, please leave a voicemail as directed and we will return your call as soon as possible. Messages left after 4 pm will be answered the following business day.   You may also send us a message via MyChart. We typically respond to MyChart messages within 1-2 business days.  For prescription refills, please ask your pharmacy to contact our office. Our fax number is [phone].  If you have an urgent issue when the clinic is closed that cannot wait until the next business day, you can page your doctor at the number below.    Please note that while we do our best to be available for urgent issues outside of office hours, we are not available 24/7.   If you have an urgent issue and are unable to reach us, you may choose to seek medical care at your doctor's office, retail clinic, urgent care center, or emergency room.  If you have a medical emergency, please immediately call 911 or go to the emergency department.  Pager Numbers  - Dr. Kowalski: [phone]  - Dr. Moye: [phone]  - Dr. Stewart:  [phone]  In the event of inclement weather, please call our main line at [phone] for an update on the status of any delays or closures.  Dermatology Medication Tips: Please keep the boxes that topical medications come in in order to help keep track of the instructions about where and how to use these. Pharmacies typically print the medication instructions only on the boxes and not directly on the medication tubes.   If your medication is too expensive, please contact our office at [phone] option 4 or send us a message through MyChart.   We are unable to tell what your co-pay for medications will be in advance as this is different depending on your insurance coverage. However, we may be able to find a substitute medication at lower cost or fill out paperwork to get insurance to cover a needed medication.   If a prior authorization is required to get your medication covered by your insurance company, please allow us 1-2 business days to complete this process.  Drug prices often vary depending on where the prescription is filled and some pharmacies may offer cheaper prices.  The website www.goodrx.com contains coupons for medications through different pharmacies. The prices here do not account for what the cost may be with help from insurance (it may be cheaper with your insurance), but the website can give you the price if you did not use any insurance.  - You can print the associated coupon and take it with   your prescription to the pharmacy.  - You may also stop by our office during regular business hours and pick up a GoodRx coupon card.  - If you need your prescription sent electronically to a different pharmacy, notify our office through [place] MyChart or by phone at [phone] option 4.     Si Usted Necesita Algo Despus de Su Visita  Tambin puede enviarnos un mensaje a travs de MyChart. Por lo general respondemos a los mensajes de MyChart en el transcurso de 1 a 2  das hbiles.  Para renovar recetas, por favor pida a su farmacia que se ponga en contacto con nuestra oficina. Nuestro nmero de fax es el [phone].  Si tiene un asunto urgente cuando la clnica est cerrada y que no puede esperar hasta el siguiente da hbil, puede llamar/localizar a su doctor(a) al nmero que aparece a continuacin.   Por favor, tenga en cuenta que aunque hacemos todo lo posible para estar disponibles para asuntos urgentes fuera del horario de oficina, no estamos disponibles las 24 horas del da, los 7 das de la semana.   Si tiene un problema urgente y no puede comunicarse con nosotros, puede optar por buscar atencin mdica  en el consultorio de su doctor(a), en una clnica privada, en un centro de atencin urgente o en una sala de emergencias.  Si tiene una emergencia mdica, por favor llame inmediatamente al 911 o vaya a la sala de emergencias.  Nmeros de bper  - Dr. Kowalski: [phone]  - Dra. Moye: [phone]  - Dra. Stewart: [phone]  En caso de inclemencias del tiempo, por favor llame a nuestra lnea principal al [phone] para una actualizacin sobre el [place] de cualquier retraso o cierre.  Consejos para la medicacin en dermatologa: Por favor, guarde las cajas en las que vienen los medicamentos de uso tpico para ayudarle a seguir las instrucciones sobre dnde y cmo usarlos. Las farmacias generalmente imprimen las instrucciones del medicamento slo en las cajas y no directamente en los tubos del medicamento.   Si su medicamento es muy caro, por favor, pngase en contacto con nuestra oficina llamando al [phone] y presione la opcin 4 o envenos un mensaje a travs de MyChart.   No podemos decirle cul ser su copago por los medicamentos por adelantado ya que esto es diferente dependiendo de la cobertura de su seguro. Sin embargo, es posible que podamos encontrar un medicamento sustituto a menor costo o llenar un formulario para que el  seguro cubra el medicamento que se considera necesario.   Si se requiere una autorizacin previa para que su compaa de seguros cubra su medicamento, por favor permtanos de 1 a 2 das hbiles para completar este proceso.  Los precios de los medicamentos varan con frecuencia dependiendo del lugar de dnde se surte la receta y alguna farmacias pueden ofrecer precios ms baratos.  El sitio web www.goodrx.com tiene cupones para medicamentos de diferentes farmacias. Los precios aqu no tienen en cuenta lo que podra costar con la ayuda del seguro (puede ser ms barato con su seguro), pero el sitio web puede darle el precio si no utiliz ningn seguro.  - Puede imprimir el cupn correspondiente y llevarlo con su receta a la farmacia.  - Tambin puede pasar por nuestra oficina durante el horario de atencin regular y recoger una tarjeta de cupones de GoodRx.  - Si necesita que su receta se enve electrnicamente a una farmacia diferente, informe a nuestra oficina a travs de MyChart de [place]   o por telfono llamando al [phone] y presione la opcin 4.  

## 2022-11-14 NOTE — Progress Notes (Signed)
   Follow-Up Visit   Subjective  Meghan Mack is a 72 y.o. female who presents for the following: Skin Problem (Recheck face. Woke up this morning with blister-like pimples. Concerned could be shingles. Washed face and areas have improved).    The following portions of the chart were reviewed this encounter and updated as appropriate:      Review of Systems: No other skin or systemic complaints except as noted in HPI or Assessment and Plan.   Objective  Well appearing patient in no apparent distress; mood and affect are within normal limits.  A focused examination was performed including face. Relevant physical exam findings are noted in the Assessment and Plan.  right cheek Erythema, no vesicles or pustules R cheek, nothing to culture  face Erythema no vesicles or pustules R cheek   Assessment & Plan   Milia - tiny firm white papules - type of cyst - benign - may be extracted if symptomatic - observe - face  Rash and other nonspecific skin eruption right cheek  Probable Rosacea less likely Shingles since pustules have cleared up since this morning. Nothing to sample for VZV PCR testing  Will send in Valtrex 1gram, 1 po tid for 7 days in case this flares over weekend, pt instructed if she gets painful blisters over the weekend and they do not go away she can start the Valtrex.      valACYclovir (VALTREX) 1000 MG tablet - right cheek Take 1 tablet (1,000 mg total) by mouth 3 (three) times daily for 7 days.  Rosacea face  Chronic and persistent condition with duration or expected duration over one year. Condition is symptomatic/ bothersome to patient. Not currently at goal.   Rosacea is a chronic progressive skin condition usually affecting the face of adults, causing redness and/or acne bumps. It is treatable but not curable. It sometimes affects the eyes (ocular rosacea) as well. It may respond to topical and/or systemic medication and can flare with  stress, sun exposure, alcohol, exercise, topical steroids (including hydrocortisone/cortisone 10) and some foods.  Daily application of broad spectrum spf 30+ sunscreen to face is recommended to reduce flares.  Cont Doxycycline '50mg'$  2 po qd with food and drink Cont Soolantra qhs  Doxycycline should be taken with food to prevent nausea. Do not lay down for 30 minutes after taking. Be cautious with sun exposure and use good sun protection while on this medication. Pregnant women should not take this medication.    Related Medications Ivermectin (SOOLANTRA) 1 % CREA Apply to face once a day for rosacea.  doxycycline (VIBRAMYCIN) 50 MG capsule Take 2 capsules (100 mg total) by mouth daily. Take with food.   Return for as scheduled.  I, Othelia Pulling, RMA, am acting as scribe for Brendolyn Patty, MD .  I, Emelia Salisbury, CMA, am acting as scribe for Brendolyn Patty, MD.  Documentation: I have reviewed the above documentation for accuracy and completeness, and I agree with the above.  Brendolyn Patty MD

## 2022-11-14 NOTE — Telephone Encounter (Signed)
Patient advised of information per Dr. Nicole Kindred. Patient called the office as well.  Patient is unsure of taking any new medications at this time and wants to know can you test these spots to confirm it is shingles.   Patient advised to take Doxycycline '50mg'$  BID.

## 2022-11-14 NOTE — Telephone Encounter (Signed)
Spoke to pt and confirmed that she would like to move forward with MRI lumbar spine pt confirmed that there is NO pacemaker OR metal in her body. Pt stated that you can call in something for her to take just in case she needs it also informed her that we will place the order in our referral whom reaching out to her to schedule in the next week

## 2022-11-14 NOTE — Progress Notes (Signed)
Patient requested 50 MG 2 po QD instead of 100 MG capsule. Rx sent in.

## 2022-11-16 MED ORDER — ALPRAZOLAM 0.5 MG PO TABS: 0.5000 mg | ORAL_TABLET | Freq: Once | ORAL | 0 refills | Status: AC

## 2022-11-16 NOTE — Addendum Note (Signed)
Addended by: Burnard Hawthorne on: 11/16/2022 10:50 AM   Modules accepted: Orders

## 2022-11-16 NOTE — Telephone Encounter (Signed)
Mri lumbar ordered  Xanax sent in

## 2022-11-26 DIAGNOSIS — M4316 Spondylolisthesis, lumbar region: Secondary | ICD-10-CM | POA: Diagnosis not present

## 2022-11-26 DIAGNOSIS — M4807 Spinal stenosis, lumbosacral region: Secondary | ICD-10-CM | POA: Diagnosis not present

## 2022-11-26 DIAGNOSIS — M47816 Spondylosis without myelopathy or radiculopathy, lumbar region: Secondary | ICD-10-CM | POA: Diagnosis not present

## 2022-11-26 DIAGNOSIS — M5135 Other intervertebral disc degeneration, thoracolumbar region: Secondary | ICD-10-CM | POA: Diagnosis not present

## 2022-11-26 DIAGNOSIS — M48061 Spinal stenosis, lumbar region without neurogenic claudication: Secondary | ICD-10-CM | POA: Diagnosis not present

## 2022-11-26 DIAGNOSIS — M5134 Other intervertebral disc degeneration, thoracic region: Secondary | ICD-10-CM | POA: Diagnosis not present

## 2022-11-26 DIAGNOSIS — M5137 Other intervertebral disc degeneration, lumbosacral region: Secondary | ICD-10-CM | POA: Diagnosis not present

## 2022-11-26 DIAGNOSIS — M47817 Spondylosis without myelopathy or radiculopathy, lumbosacral region: Secondary | ICD-10-CM | POA: Diagnosis not present

## 2022-11-26 DIAGNOSIS — M5136 Other intervertebral disc degeneration, lumbar region: Secondary | ICD-10-CM | POA: Diagnosis not present

## 2022-11-27 ENCOUNTER — Encounter: Payer: Self-pay | Admitting: Family

## 2022-11-27 ENCOUNTER — Telehealth: Payer: Self-pay | Admitting: Family

## 2022-11-27 NOTE — Telephone Encounter (Signed)
Patient called back and stated that [facility] will read the MRI results and fax to [facility] on 11/28/2022.

## 2022-11-27 NOTE — Telephone Encounter (Signed)
Patient called and said she had a MRI at [facility] yesterday. [facility] gave her a disk to take to her provider to read. She was told that they do not read them. What should patient do with the disc since office does not have a device to read it?

## 2022-11-27 NOTE — Telephone Encounter (Signed)
NOTED

## 2022-11-28 ENCOUNTER — Telehealth: Payer: Self-pay

## 2022-11-28 ENCOUNTER — Other Ambulatory Visit: Payer: Self-pay

## 2022-11-28 DIAGNOSIS — M545 Low back pain, unspecified: Secondary | ICD-10-CM

## 2022-11-28 NOTE — Telephone Encounter (Signed)
LVM to call back to office  

## 2022-11-28 NOTE — Telephone Encounter (Signed)
Spoke to pt and she was agreeable to Referral for ortho and referral was placed  to [facility] per pt

## 2022-11-28 NOTE — Telephone Encounter (Signed)
Pt returned call. Transferred.

## 2022-11-28 NOTE — Progress Notes (Signed)
Orders only

## 2022-12-03 DIAGNOSIS — E559 Vitamin D deficiency, unspecified: Secondary | ICD-10-CM | POA: Diagnosis not present

## 2022-12-03 DIAGNOSIS — E890 Postprocedural hypothyroidism: Secondary | ICD-10-CM | POA: Diagnosis not present

## 2022-12-06 DIAGNOSIS — D2272 Melanocytic nevi of left lower limb, including hip: Secondary | ICD-10-CM | POA: Diagnosis not present

## 2022-12-06 DIAGNOSIS — D2271 Melanocytic nevi of right lower limb, including hip: Secondary | ICD-10-CM | POA: Diagnosis not present

## 2022-12-06 DIAGNOSIS — D2262 Melanocytic nevi of left upper limb, including shoulder: Secondary | ICD-10-CM | POA: Diagnosis not present

## 2022-12-06 DIAGNOSIS — L718 Other rosacea: Secondary | ICD-10-CM | POA: Diagnosis not present

## 2022-12-06 DIAGNOSIS — L821 Other seborrheic keratosis: Secondary | ICD-10-CM | POA: Diagnosis not present

## 2022-12-06 DIAGNOSIS — D2261 Melanocytic nevi of right upper limb, including shoulder: Secondary | ICD-10-CM | POA: Diagnosis not present

## 2022-12-06 DIAGNOSIS — D225 Melanocytic nevi of trunk: Secondary | ICD-10-CM | POA: Diagnosis not present

## 2022-12-11 DIAGNOSIS — M8580 Other specified disorders of bone density and structure, unspecified site: Secondary | ICD-10-CM | POA: Diagnosis not present

## 2022-12-11 DIAGNOSIS — E559 Vitamin D deficiency, unspecified: Secondary | ICD-10-CM | POA: Diagnosis not present

## 2022-12-11 DIAGNOSIS — E890 Postprocedural hypothyroidism: Secondary | ICD-10-CM | POA: Diagnosis not present

## 2022-12-14 DIAGNOSIS — M5416 Radiculopathy, lumbar region: Secondary | ICD-10-CM | POA: Diagnosis not present

## 2023-01-03 DIAGNOSIS — M5416 Radiculopathy, lumbar region: Secondary | ICD-10-CM | POA: Diagnosis not present

## 2023-01-03 DIAGNOSIS — M25551 Pain in right hip: Secondary | ICD-10-CM | POA: Diagnosis not present

## 2023-01-16 DIAGNOSIS — H40003 Preglaucoma, unspecified, bilateral: Secondary | ICD-10-CM | POA: Diagnosis not present

## 2023-01-22 DIAGNOSIS — H40003 Preglaucoma, unspecified, bilateral: Secondary | ICD-10-CM | POA: Diagnosis not present

## 2023-01-30 DIAGNOSIS — M25871 Other specified joint disorders, right ankle and foot: Secondary | ICD-10-CM | POA: Diagnosis not present

## 2023-01-30 DIAGNOSIS — M216X1 Other acquired deformities of right foot: Secondary | ICD-10-CM | POA: Diagnosis not present

## 2023-01-30 DIAGNOSIS — M216X2 Other acquired deformities of left foot: Secondary | ICD-10-CM | POA: Diagnosis not present

## 2023-01-30 DIAGNOSIS — M778 Other enthesopathies, not elsewhere classified: Secondary | ICD-10-CM | POA: Diagnosis not present

## 2023-02-04 ENCOUNTER — Ambulatory Visit (INDEPENDENT_AMBULATORY_CARE_PROVIDER_SITE_OTHER): Payer: Medicare Other | Admitting: Dermatology

## 2023-02-04 ENCOUNTER — Encounter: Payer: Self-pay | Admitting: Dermatology

## 2023-02-04 VITALS — BP 115/78

## 2023-02-04 DIAGNOSIS — L570 Actinic keratosis: Secondary | ICD-10-CM

## 2023-02-04 DIAGNOSIS — L719 Rosacea, unspecified: Secondary | ICD-10-CM | POA: Diagnosis not present

## 2023-02-04 DIAGNOSIS — L304 Erythema intertrigo: Secondary | ICD-10-CM

## 2023-02-04 NOTE — Progress Notes (Signed)
Follow-Up Visit   Subjective  Meghan Mack is a 72 y.o. female who presents for the following: Rosacea face, using Metrogel prn, not daily.  She feels like she is doing better and isn't taking doxycycline.    The following portions of the chart were reviewed this encounter and updated as appropriate: medications, allergies, medical history  Review of Systems:  No other skin or systemic complaints except as noted in HPI or Assessment and Plan.  Objective  Well appearing patient in no apparent distress; mood and affect are within normal limits.   A focused examination was performed of the following areas: face  Relevant exam findings are noted in the Assessment and Plan.    Assessment & Plan   ROSACEA Exam Mild erythema and scattered pink macules nose, cheeks  Chronic and persistent condition with duration or expected duration over one year. Condition is symptomatic / bothersome to patient. Improved but not to goal.   Rosacea is a chronic progressive skin condition usually affecting the face of adults, causing redness and/or acne bumps. It is treatable but not curable. It sometimes affects the eyes (ocular rosacea) as well. It may respond to topical and/or systemic medication and can flare with stress, sun exposure, alcohol, exercise, topical steroids (including hydrocortisone/cortisone 10) and some foods.  Daily application of broad spectrum spf 30+ sunscreen to face is recommended to reduce flares.  Patient denies grittiness of the eyes  Treatment Plan Increase Metrogel 0.75% to qhs to face, will help to prevent flares when used regularly Cont SPF 30+ daily  Recommend Neutragena Hydro boost for dry skin  ACTINIC KERATOSIS Exam: Erythematous thin papules/macules with gritty scale nasal dorsum  Actinic keratoses are precancerous spots that appear secondary to cumulative UV radiation exposure/sun exposure over time. They are chronic with expected duration over 1 year.  A portion of actinic keratoses will progress to squamous cell carcinoma of the skin. It is not possible to reliably predict which spots will progress to skin cancer and so treatment is recommended to prevent development of skin cancer.  Recommend daily broad spectrum sunscreen SPF 30+ to sun-exposed areas, reapply every 2 hours as needed.  Recommend staying in the shade or wearing long sleeves, sun glasses (UVA+UVB protection) and wide brim hats (4-inch brim around the entire circumference of the hat). Call for new or changing lesions.  Treatment Plan:  Prior to procedure, discussed risks of blister formation, small wound, skin dyspigmentation, or rare scar following cryotherapy. Recommend Vaseline ointment to treated areas while healing.  Destruction Procedure Note Destruction method: cryotherapy   Informed consent: discussed and consent obtained   Lesion destroyed using liquid nitrogen: Yes   Outcome: patient tolerated procedure well with no complications   Post-procedure details: wound care instructions given   Locations: upper nasal dorsum # of Lesions Treated: 1  INTERTRIGO perianal Exam Not examined today  Chronic and persistent condition with duration or expected duration over one year. Condition is symptomatic/ bothersome to patient. Not currently at goal.   Intertrigo is a chronic recurrent rash that occurs in skin fold areas that may be associated with friction; heat; moisture; yeast; fungus; and bacteria.  It is exacerbated by increased movement / activity; sweating; and higher atmospheric temperature.  Treatment Plan Start Ketoconazole 2% cr qhs to buttocks (pt has Ketoconazole at home)    Return in about 6 months (around 08/06/2023) for Rosacea f/u.  I, Ardis Rowan, RMA, am acting as scribe for Willeen Niece, MD .   Documentation:  I have reviewed the above documentation for accuracy and completeness, and I agree with the above.  Willeen Niece, MD

## 2023-02-04 NOTE — Patient Instructions (Addendum)
Cryotherapy Aftercare  Wash gently with soap and water everyday.   Apply Vaseline and Band-Aid daily until healed.     Due to recent changes in healthcare laws, you may see results of your pathology and/or laboratory studies on MyChart before the doctors have had a chance to review them. We understand that in some cases there may be results that are confusing or concerning to you. Please understand that not all results are received at the same time and often the doctors may need to interpret multiple results in order to provide you with the best plan of care or course of treatment. Therefore, we ask that you please give us 2 business days to thoroughly review all your results before contacting the office for clarification. Should we see a critical lab result, you will be contacted sooner.   If You Need Anything After Your Visit  If you have any questions or concerns for your doctor, please call our main line at [phone] and press option 4 to reach your doctor's medical assistant. If no one answers, please leave a voicemail as directed and we will return your call as soon as possible. Messages left after 4 pm will be answered the following business day.   You may also send us a message via MyChart. We typically respond to MyChart messages within 1-2 business days.  For prescription refills, please ask your pharmacy to contact our office. Our fax number is [phone].  If you have an urgent issue when the clinic is closed that cannot wait until the next business day, you can page your doctor at the number below.    Please note that while we do our best to be available for urgent issues outside of office hours, we are not available 24/7.   If you have an urgent issue and are unable to reach us, you may choose to seek medical care at your doctor's office, retail clinic, urgent care center, or emergency room.  If you have a medical emergency, please immediately call 911 or go to the  emergency department.  Pager Numbers  - Dr. Kowalski: [phone]  - Dr. Moye: [phone]  - Dr. Stewart: [phone]  In the event of inclement weather, please call our main line at [phone] for an update on the status of any delays or closures.  Dermatology Medication Tips: Please keep the boxes that topical medications come in in order to help keep track of the instructions about where and how to use these. Pharmacies typically print the medication instructions only on the boxes and not directly on the medication tubes.   If your medication is too expensive, please contact our office at [phone] option 4 or send us a message through MyChart.   We are unable to tell what your co-pay for medications will be in advance as this is different depending on your insurance coverage. However, we may be able to find a substitute medication at lower cost or fill out paperwork to get insurance to cover a needed medication.   If a prior authorization is required to get your medication covered by your insurance company, please allow us 1-2 business days to complete this process.  Drug prices often vary depending on where the prescription is filled and some pharmacies may offer cheaper prices.  The website www.goodrx.com contains coupons for medications through different pharmacies. The prices here do not account for what the cost may be with help from insurance (it may be cheaper with your insurance), but the website can   give you the price if you did not use any insurance.  - You can print the associated coupon and take it with your prescription to the pharmacy.  - You may also stop by our office during regular business hours and pick up a GoodRx coupon card.  - If you need your prescription sent electronically to a different pharmacy, notify our office through [facility] MyChart or by phone at [phone] option 4.     Si Usted Necesita Algo Despus de Su Visita  Tambin puede  enviarnos un mensaje a travs de MyChart. Por lo general respondemos a los mensajes de MyChart en el transcurso de 1 a 2 das hbiles.  Para renovar recetas, por favor pida a su farmacia que se ponga en contacto con nuestra oficina. Nuestro nmero de fax es el [phone].  Si tiene un asunto urgente cuando la clnica est cerrada y que no puede esperar hasta el siguiente da hbil, puede llamar/localizar a su doctor(a) al nmero que aparece a continuacin.   Por favor, tenga en cuenta que aunque hacemos todo lo posible para estar disponibles para asuntos urgentes fuera del horario de oficina, no estamos disponibles las 24 horas del da, los 7 das de la semana.   Si tiene un problema urgente y no puede comunicarse con nosotros, puede optar por buscar atencin mdica  en el consultorio de su doctor(a), en una clnica privada, en un centro de atencin urgente o en una sala de emergencias.  Si tiene una emergencia mdica, por favor llame inmediatamente al 911 o vaya a la sala de emergencias.  Nmeros de bper  - Dr. Kowalski: [phone]  - Dra. Moye: [phone]  - Dra. Stewart: [phone]  En caso de inclemencias del tiempo, por favor llame a nuestra lnea principal al [phone] para una actualizacin sobre el estado de cualquier retraso o cierre.  Consejos para la medicacin en dermatologa: Por favor, guarde las cajas en las que vienen los medicamentos de uso tpico para ayudarle a seguir las instrucciones sobre dnde y cmo usarlos. Las farmacias generalmente imprimen las instrucciones del medicamento slo en las cajas y no directamente en los tubos del medicamento.   Si su medicamento es muy caro, por favor, pngase en contacto con nuestra oficina llamando al [phone] y presione la opcin 4 o envenos un mensaje a travs de MyChart.   No podemos decirle cul ser su copago por los medicamentos por adelantado ya que esto es diferente dependiendo de la cobertura de su seguro.  Sin embargo, es posible que podamos encontrar un medicamento sustituto a menor costo o llenar un formulario para que el seguro cubra el medicamento que se considera necesario.   Si se requiere una autorizacin previa para que su compaa de seguros cubra su medicamento, por favor permtanos de 1 a 2 das hbiles para completar este proceso.  Los precios de los medicamentos varan con frecuencia dependiendo del lugar de dnde se surte la receta y alguna farmacias pueden ofrecer precios ms baratos.  El sitio web www.goodrx.com tiene cupones para medicamentos de diferentes farmacias. Los precios aqu no tienen en cuenta lo que podra costar con la ayuda del seguro (puede ser ms barato con su seguro), pero el sitio web puede darle el precio si no utiliz ningn seguro.  - Puede imprimir el cupn correspondiente y llevarlo con su receta a la farmacia.  - Tambin puede pasar por nuestra oficina durante el horario de atencin regular y recoger una tarjeta de cupones de GoodRx.  -   Si necesita que su receta se enve electrnicamente a una farmacia diferente, informe a nuestra oficina a travs de MyChart de [facility] o por telfono llamando al [phone] y presione la opcin 4.  

## 2023-02-18 ENCOUNTER — Other Ambulatory Visit: Payer: Self-pay | Admitting: Dermatology

## 2023-02-18 DIAGNOSIS — L719 Rosacea, unspecified: Secondary | ICD-10-CM

## 2023-03-20 DIAGNOSIS — Z6823 Body mass index (BMI) 23.0-23.9, adult: Secondary | ICD-10-CM | POA: Diagnosis not present

## 2023-03-20 DIAGNOSIS — R300 Dysuria: Secondary | ICD-10-CM | POA: Diagnosis not present

## 2023-04-17 ENCOUNTER — Ambulatory Visit (INDEPENDENT_AMBULATORY_CARE_PROVIDER_SITE_OTHER): Payer: Medicare Other | Admitting: *Deleted

## 2023-04-17 VITALS — Ht 67.5 in | Wt 148.0 lb

## 2023-04-17 DIAGNOSIS — Z0000 Encounter for general adult medical examination without abnormal findings: Secondary | ICD-10-CM | POA: Diagnosis not present

## 2023-04-17 NOTE — Patient Instructions (Signed)
[name] , Thank you for taking time to come for your Medicare Wellness Visit. I appreciate your ongoing commitment to your health goals. Please review the following plan we discussed and let me know if I can assist you in the future.   These are the goals we discussed:  Goals       Patient Stated      Trying to cut back on carbs      Weight (lb) < 145 lb (65.8 kg) (pt-stated)      Monitor carb intake; portion control        This is a list of the screening recommended for you and due dates:  Health Maintenance  Topic Date Due   Zoster (Shingles) Vaccine (1 of 2) Never done   DTaP/Tdap/Td vaccine (3 - Td or Tdap) 09/02/2021   COVID-19 Vaccine (5 - 2023-24 season) 06/15/2022   Pneumonia Vaccine (3 of 3 - PPSV23 or PCV20) 10/05/2023*   Flu Shot  05/16/2023   Medicare Annual Wellness Visit  04/16/2024   Colon Cancer Screening  07/20/2024   Mammogram  10/18/2024   DEXA scan (bone density measurement)  Completed   Hepatitis C Screening  Completed   HPV Vaccine  Aged Out  *Topic was postponed. The date shown is not the original due date.    Advanced directives: Patient will get the paperwork on work on getting this taken care of  Conditions/risks identified: none  Next appointment: Follow up in one year for your annual wellness visit 04/21/24 @ 10:15   Preventive Care 65 Years and Older, Female Preventive care refers to lifestyle choices and visits with your health care provider that can promote health and wellness. What does preventive care include? A yearly physical exam. This is also called an annual well check. Dental exams once or twice a year. Routine eye exams. Ask your health care provider how often you should have your eyes checked. Personal lifestyle choices, including: Daily care of your teeth and gums. Regular physical activity. Eating a healthy diet. Avoiding tobacco and drug use. Limiting alcohol use. Practicing safe sex. Taking low-dose aspirin every  day. Taking vitamin and mineral supplements as recommended by your health care provider. What happens during an annual well check? The services and screenings done by your health care provider during your annual well check will depend on your age, overall health, lifestyle risk factors, and family history of disease. Counseling  Your health care provider may ask you questions about your: Alcohol use. Tobacco use. Drug use. Emotional well-being. Home and relationship well-being. Sexual activity. Eating habits. History of falls. Memory and ability to understand (cognition). Work and work Astronomer. Reproductive health. Screening  You may have the following tests or measurements: Height, weight, and BMI. Blood pressure. Lipid and cholesterol levels. These may be checked every 5 years, or more frequently if you are over 43 years old. Skin check. Lung cancer screening. You may have this screening every year starting at age 28 if you have a 30-pack-year history of smoking and currently smoke or have quit within the past 15 years. Fecal occult blood test (FOBT) of the stool. You may have this test every year starting at age 62. Flexible sigmoidoscopy or colonoscopy. You may have a sigmoidoscopy every 5 years or a colonoscopy every 10 years starting at age 28. Hepatitis C blood test. Hepatitis B blood test. Sexually transmitted disease (STD) testing. Diabetes screening. This is done by checking your blood sugar (glucose) after you have not eaten  for a while (fasting). You may have this done every 1-3 years. Bone density scan. This is done to screen for osteoporosis. You may have this done starting at age 64. Mammogram. This may be done every 1-2 years. Talk to your health care provider about how often you should have regular mammograms. Talk with your health care provider about your test results, treatment options, and if necessary, the need for more tests. Vaccines  Your health care  provider may recommend certain vaccines, such as: Influenza vaccine. This is recommended every year. Tetanus, diphtheria, and acellular pertussis (Tdap, Td) vaccine. You may need a Td booster every 10 years. Zoster vaccine. You may need this after age 73. Pneumococcal 13-valent conjugate (PCV13) vaccine. One dose is recommended after age 8. Pneumococcal polysaccharide (PPSV23) vaccine. One dose is recommended after age 4. Talk to your health care provider about which screenings and vaccines you need and how often you need them. This information is not intended to replace advice given to you by your health care provider. Make sure you discuss any questions you have with your health care provider. Document Released: 10/28/2015 Document Revised: 06/20/2016 Document Reviewed: 08/02/2015 Elsevier Interactive Patient Education  2017 [employer].  Fall Prevention in the Home Falls can cause injuries. They can happen to people of all ages. There are many things you can do to make your home safe and to help prevent falls. What can I do on the outside of my home? Regularly fix the edges of walkways and driveways and fix any cracks. Remove anything that might make you trip as you walk through a door, such as a raised step or threshold. Trim any bushes or trees on the path to your home. Use bright outdoor lighting. Clear any walking paths of anything that might make someone trip, such as rocks or tools. Regularly check to see if handrails are loose or broken. Make sure that both sides of any steps have handrails. Any raised decks and porches should have guardrails on the edges. Have any leaves, snow, or ice cleared regularly. Use sand or salt on walking paths during winter. Clean up any spills in your garage right away. This includes oil or grease spills. What can I do in the bathroom? Use night lights. Install grab bars by the toilet and in the tub and shower. Do not use towel bars as grab  bars. Use non-skid mats or decals in the tub or shower. If you need to sit down in the shower, use a plastic, non-slip stool. Keep the floor dry. Clean up any water that spills on the floor as soon as it happens. Remove soap buildup in the tub or shower regularly. Attach bath mats securely with double-sided non-slip rug tape. Do not have throw rugs and other things on the floor that can make you trip. What can I do in the bedroom? Use night lights. Make sure that you have a light by your bed that is easy to reach. Do not use any sheets or blankets that are too big for your bed. They should not hang down onto the floor. Have a firm chair that has side arms. You can use this for support while you get dressed. Do not have throw rugs and other things on the floor that can make you trip. What can I do in the kitchen? Clean up any spills right away. Avoid walking on wet floors. Keep items that you use a lot in easy-to-reach places. If you need to reach something  above you, use a strong step stool that has a grab bar. Keep electrical cords out of the way. Do not use floor polish or wax that makes floors slippery. If you must use wax, use non-skid floor wax. Do not have throw rugs and other things on the floor that can make you trip. What can I do with my stairs? Do not leave any items on the stairs. Make sure that there are handrails on both sides of the stairs and use them. Fix handrails that are broken or loose. Make sure that handrails are as long as the stairways. Check any carpeting to make sure that it is firmly attached to the stairs. Fix any carpet that is loose or worn. Avoid having throw rugs at the top or bottom of the stairs. If you do have throw rugs, attach them to the floor with carpet tape. Make sure that you have a light switch at the top of the stairs and the bottom of the stairs. If you do not have them, ask someone to add them for you. What else can I do to help prevent  falls? Wear shoes that: Do not have high heels. Have rubber bottoms. Are comfortable and fit you well. Are closed at the toe. Do not wear sandals. If you use a stepladder: Make sure that it is fully opened. Do not climb a closed stepladder. Make sure that both sides of the stepladder are locked into place. Ask someone to hold it for you, if possible. Clearly mark and make sure that you can see: Any grab bars or handrails. First and last steps. Where the edge of each step is. Use tools that help you move around (mobility aids) if they are needed. These include: Canes. Walkers. Scooters. Crutches. Turn on the lights when you go into a dark area. Replace any light bulbs as soon as they burn out. Set up your furniture so you have a clear path. Avoid moving your furniture around. If any of your floors are uneven, fix them. If there are any pets around you, be aware of where they are. Review your medicines with your doctor. Some medicines can make you feel dizzy. This can increase your chance of falling. Ask your doctor what other things that you can do to help prevent falls. This information is not intended to replace advice given to you by your health care provider. Make sure you discuss any questions you have with your health care provider. Document Released: 07/28/2009 Document Revised: 03/08/2016 Document Reviewed: 11/05/2014 Elsevier Interactive Patient Education  2017 [employer].

## 2023-04-17 NOTE — Progress Notes (Signed)
Subjective:   Meghan Mack is a 72 y.o. female who presents for Medicare Annual (Subsequent) preventive examination.  Visit Complete: Virtual  I connected with  [name] on 04/17/23 by a audio enabled telemedicine application and verified that I am speaking with the correct person using two identifiers.  Patient Location: Home  Provider Location: Office/Clinic  I discussed the limitations of evaluation and management by telemedicine. The patient expressed understanding and agreed to proceed.   Review of Systems     Cardiac Risk Factors include: advanced age (>51men, >88 women);dyslipidemia;Other (see comment), Risk factor comments: coronary artery calcification     Objective:    Today's Vitals   04/17/23 1021  Weight: 148 lb (67.1 kg)  Height: 5' 7.5" (1.715 m)   Body mass index is 22.84 kg/m.     04/17/2023   10:37 AM 03/23/2022   11:25 AM 07/10/2021    8:58 AM 03/08/2021    9:52 AM 01/23/2021    9:36 AM 03/07/2020    9:42 AM 12/29/2019    9:45 AM  Advanced Directives  Does Patient Have a Medical Advance Directive? No No Yes Yes Yes Yes Yes  Type of Surveyor, minerals;Living will Healthcare Power of [place];Living will  Healthcare Power of [place];Living will   Does patient want to make changes to medical advance directive?    No - Patient declined  No - Patient declined   Copy of Healthcare Power of Attorney in Chart?    No - copy requested  No - copy requested   Would patient like information on creating a medical advance directive?  No - Patient declined         Current Medications (verified) Outpatient Encounter Medications as of 04/17/2023  Medication Sig   BYSTOLIC 5 MG tablet TAKE 1 TABLET (5 MG TOTAL) BY MOUTH DAILY. MAY TAKE EXTRA 1 TABLET (5 MG )AS NEEDED FOR TACHYCARDIA   ergocalciferol (VITAMIN D2) 50000 UNITS capsule Take 50,000 Units by mouth every 30 (thirty) days.   ibuprofen (ADVIL,MOTRIN) 600 MG tablet Take  1 tablet (600 mg total) by mouth every 8 (eight) hours as needed.   Ivermectin (SOOLANTRA) 1 % CREA Apply to face once a day for rosacea.   latanoprost (XALATAN) 0.005 % ophthalmic solution SMARTSIG:In Eye(s)   levothyroxine (SYNTHROID, LEVOTHROID) 88 MCG tablet Take 1 tablet 6 days a week.   Multiple Vitamin (MULTIVITAMIN PO) Take 1 tablet by mouth daily.   simvastatin (ZOCOR) 20 MG tablet TAKE 1 TABLET BY MOUTH EVERY DAY IN THE EVENING   triamcinolone (KENALOG) 0.025 % cream Apply 1 application. topically as directed. Qd to bid aa chest, shoulders until clear then prn flares, avoid face, groin, axilla   doxycycline (VIBRAMYCIN) 50 MG capsule Take 2 capsules (100 mg total) by mouth daily. Take with food. (Patient not taking: Reported on 04/17/2023)   Fluocinolone Acetonide 0.01 % OIL Apply 1-2 drops up to twice daily to ears as needed for itching/irritation (Patient not taking: Reported on 04/17/2023)   No facility-administered encounter medications on file as of 04/17/2023.    Allergies (verified) Diflucan [fluconazole], Tdap [tetanus-diphth-acell pertussis], Codeine, Diphth-acell pertussis-tetanus, Ezetimibe, Propantheline, Propantheline bromide, Propoxyphene, Sulfa antibiotics, Sulfonamide derivatives, Esomeprazole magnesium, Gadolinium derivatives, Meperidine hcl, Pneumovax 23 [pneumococcal vac polyvalent], and Propoxyphene n-acetaminophen   History: Past Medical History:  Diagnosis Date   Actinic keratosis    Anemia    Colonic polyp    Constipation    COPD (chronic obstructive  pulmonary disease) (HCC)    Hyperlipidemia    IBS (irritable bowel syndrome)    Kidney cysts    Lung nodule    Meningioma (HCC)    Nausea    Palpitations    Tonsillar cyst    Past Surgical History:  Procedure Laterality Date   CARDIAC CATHETERIZATION  02/25/2007   COLONOSCOPY     ESOPHAGOGASTRODUODENOSCOPY  10/1997 & 08/2005   FOOT SURGERY  04/25/2007   right   THYROIDECTOMY  11/2005   VAGINAL  HYSTERECTOMY     ovaries intact   Family History  Problem Relation Age of Onset   Cancer Mother        colon   Emphysema Father    Heart disease Father    Cancer Father        leukemia   Cancer Brother        lung   Cancer Maternal Aunt        melanoma   Cancer Maternal Grandfather        stomach, colon   Breast cancer Neg Hx    Kidney cancer Neg Hx    Kidney disease Neg Hx    Prostate cancer Neg Hx    Social History   Socioeconomic History   Marital status: Widowed    Spouse name: Not on file   Number of children: 2   Years of education: Not on file   Highest education level: Not on file  Occupational History   Occupation: Musician: [employer]  Tobacco Use   Smoking status: Former    Packs/day: 0.25    Years: 10.00    Additional pack years: 0.00    Total pack years: 2.50    Types: Cigarettes    Quit date: 09/25/1997    Years since quitting: 25.5   Smokeless tobacco: Never  Vaping Use   Vaping Use: Never used  Substance and Sexual Activity   Alcohol use: No   Drug use: No   Sexual activity: Yes    Partners: Male    Birth control/protection: Surgical  Other Topics Concern   Not on file  Social History Narrative   Husband passed away 05-26-21      has 2 children and 2 step-children. No pets.      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       Diet - regular   Exercise - walks 5 days per week   1.5 years of college      Caffeine - 2-3 glasses iced tea day; coffee 1 cup 1/2 decaf and 1/2 reg   Right handed. One level home.    Social Determinants of Health   Financial Resource Strain: Low Risk  (04/17/2023)   Overall Financial Resource Strain (CARDIA)    Difficulty of Paying Living Expenses: Not hard at all  Food Insecurity: No Food Insecurity (04/17/2023)   Hunger Vital Sign    Worried About Running Out of Food in the Last Year: Never true    Ran Out of Food in the Last Year: Never true  Transportation Needs: No  Transportation Needs (04/17/2023)   PRAPARE - Administrator, Civil Service (Medical): No    Lack of Transportation (Non-Medical): No  Physical Activity: Insufficiently Active (04/17/2023)   Exercise Vital Sign    Days of Exercise per Week: 2 days    Minutes of Exercise per Session: 20 min  Stress: Stress Concern Present (04/17/2023)  [employer] of Occupational Health - Occupational Stress Questionnaire    Feeling of Stress : To some extent  Social Connections: Socially Isolated (04/17/2023)   Social Connection and Isolation Panel [NHANES]    Frequency of Communication with Friends and Family: More than three times a week    Frequency of Social Gatherings with Friends and Family: Twice a week    Attends Religious Services: Never    Database administrator or Organizations: No    Attends Banker Meetings: Never    Marital Status: Widowed    Tobacco Counseling Counseling given: Not Answered   Clinical Intake:  Pre-visit preparation completed: Yes  Pain : No/denies pain     BMI - recorded: 22.84 Nutritional Risks: None, Nausea/ vomitting/ diarrhea (some vertigo) Diabetes: No  How often do you need to have someone help you when you read instructions, pamphlets, or other written materials from your doctor or pharmacy?: 1 - Never  Interpreter Needed?: No  Information entered by :: R. Adaia Matthies LPN   Activities of Daily Living    04/17/2023   10:24 AM 04/16/2023    2:29 PM  In your present state of health, do you have any difficulty performing the following activities:  Hearing? 1 0  Comment wears aids   Vision? 0 0  Difficulty concentrating or making decisions? 0 1  Walking or climbing stairs? 0 0  Dressing or bathing? 0 0  Doing errands, shopping? 0 0  Preparing Food and eating ? N N  Using the Toilet? N N  In the past six months, have you accidently leaked urine? Malvin Johns  Comment occurs at times   Do you have problems with loss of bowel control? N N   Managing your Medications? N N  Managing your Finances? N N  Housekeeping or managing your Housekeeping? N N    Patient Care Team: Allegra Grana, FNP as PCP - General (Family Medicine) Mariah Milling Tollie Pizza, MD as Consulting Physician (Cardiology) Drema Dallas, DO as Consulting Physician (Neurology)  Indicate any recent Medical Services you may have received from other than [facility] providers in the past year (date may be approximate).     Assessment:   This is a routine wellness examination for [place]nn.  Hearing/Vision screen Hearing Screening - Comments:: Wears hearing aids at times Vision Screening - Comments:: Glasses no issues  Dietary issues and exercise activities discussed:     Goals Addressed             This Visit's Progress    Patient Stated       Trying to cut back on carbs      Depression Screen    04/17/2023   10:31 AM 10/19/2022    8:43 AM 07/18/2022    9:01 AM 03/23/2022   11:42 AM 07/14/2021    9:38 AM 03/08/2021    9:51 AM 06/17/2020   11:13 AM  PHQ 2/9 Scores  PHQ - 2 Score 1 0 0 0 1 0 0  PHQ- 9 Score 5 2   4  4     Fall Risk    04/17/2023   10:42 AM 04/16/2023    2:29 PM 10/19/2022    8:42 AM 07/18/2022    9:01 AM 03/23/2022   11:54 AM  Fall Risk   Falls in the past year? 1 1 0 0 1  Number falls in past yr: 1 0 0 0 0  Injury with Fall? 0 0 0 0 0  Risk for fall due to : No Fall Risks;Impaired balance/gait  No Fall Risks No Fall Risks   Risk for fall due to: Comment tripped over her own feet,      Follow up Falls prevention discussed;Falls evaluation completed;Education provided  Falls evaluation completed Falls evaluation completed Falls evaluation completed    MEDICARE RISK AT HOME:  Medicare Risk at Home - 04/17/23 1044     Any stairs in or around the home? No    If so, are there any without handrails? No    Home free of loose throw rugs in walkways, pet beds, electrical cords, etc? Yes    Adequate lighting in your home to reduce risk of falls? Yes     Life alert? No    Use of a cane, walker or w/c? No    Grab bars in the bathroom? Yes    Shower chair or bench in shower? No    Elevated toilet seat or a handicapped toilet? Yes              Cognitive Function:    01/01/2018    8:38 AM 08/03/2015   12:03 PM  MMSE - Mini Mental State Exam  Not completed: Refused   Orientation to time  5  Orientation to Place  5  Registration  3  Attention/ Calculation  4  Recall  2  Language- name 2 objects  2  Language- repeat  1  Language- follow 3 step command  3  Language- read & follow direction  1  Write a sentence  1  Copy design  1  Total score  28      01/19/2016   11:00 AM  Montreal Cognitive Assessment   Visuospatial/ Executive (0/5) 3  Naming (0/3) 3  Attention: Read list of digits (0/2) 2  Attention: Read list of letters (0/1) 1  Attention: Serial 7 subtraction starting at 100 (0/3) 0  Language: Repeat phrase (0/2) 2  Language : Fluency (0/1) 1  Abstraction (0/2) 2  Delayed Recall (0/5) 3  Orientation (0/6) 6  Total 23  Adjusted Score (based on education) 23      04/17/2023   10:40 AM 03/07/2020   10:07 AM 03/05/2019    9:55 AM  6CIT Screen  What Year? 0 points 0 points 0 points  What month? 0 points 0 points 0 points  What time? 0 points 0 points 0 points  Count back from 20 0 points 0 points 0 points  Months in reverse 0 points 0 points 0 points  Repeat phrase 0 points 2 points 0 points  Total Score 0 points 2 points 0 points    Immunizations Immunization History  Administered Date(s) Administered   Fluad Quad(high Dose 65+) 07/14/2021, 07/18/2022   Influenza Split 07/15/2011, 06/26/2014   Influenza Whole 06/22/2009, 06/13/2010, 07/05/2012, 07/06/2018   Influenza, High Dose Seasonal PF 07/06/2018, 07/16/2019   Influenza,inj,quad, With Preservative 07/16/2019   Influenza-Unspecified 06/15/2013, 06/19/2015, 06/29/2016, 07/14/2017, 07/16/2019, 07/11/2020   Moderna Covid-19 Vaccine Bivalent Booster 35yrs  & up 10/27/2021   PFIZER(Purple Top)SARS-COV-2 Vaccination 11/23/2019, 12/18/2019, 08/01/2020   Pneumococcal Conjugate-13 06/27/2015   Pneumococcal Polysaccharide-23 08/11/2007   Td 07/17/2001   Tdap 09/03/2011   Zoster, Live 06/13/2010    TDAP status: Due, Education has been provided regarding the importance of this vaccine. Advised may receive this vaccine at local pharmacy or Health Dept. Aware to provide a copy of the vaccination record if obtained from local pharmacy or Health Dept. Verbalized acceptance  and understanding.  Flu Vaccine status: Up to date  Pneumococcal vaccine status: Up to date  Covid-19 vaccine status: Completed vaccines  Qualifies for Shingles Vaccine? Yes   Zostavax completed Yes   Shingrix Completed?: No.    Education has been provided regarding the importance of this vaccine. Patient has been advised to call insurance company to determine out of pocket expense if they have not yet received this vaccine. Advised may also receive vaccine at local pharmacy or Health Dept. Verbalized acceptance and understanding.  Screening Tests Health Maintenance  Topic Date Due   Zoster Vaccines- Shingrix (1 of 2) Never done   DTaP/Tdap/Td (3 - Td or Tdap) 09/02/2021   COVID-19 Vaccine (5 - 2023-24 season) 06/15/2022   Pneumonia Vaccine 43+ Years old (3 of 3 - PPSV23 or PCV20) 10/05/2023 (Originally 06/26/2020)   INFLUENZA VACCINE  05/16/2023   Medicare Annual Wellness (AWV)  04/16/2024   Colonoscopy  07/20/2024   MAMMOGRAM  10/18/2024   DEXA SCAN  Completed   Hepatitis C Screening  Completed   HPV VACCINES  Aged Out    Health Maintenance  Health Maintenance Due  Topic Date Due   Zoster Vaccines- Shingrix (1 of 2) Never done   DTaP/Tdap/Td (3 - Td or Tdap) 09/02/2021   COVID-19 Vaccine (5 - 2023-24 season) 06/15/2022    Colorectal cancer screening: Type of screening: Colonoscopy. Completed 10/20. Repeat every 5 years  Mammogram status: Completed 1/24. Repeat  every year  Bone Density status: Completed 11/23. Results reflect: Bone density results: OSTEOPENIA. Repeat every 2 years.  Lung Cancer Screening: (Low Dose CT Chest recommended if Age 2-80 years, 20 pack-year currently smoking OR have quit w/in 15years.) does not qualify.     Additional Screening:  Hepatitis C Screening: does qualify; Completed 9/16  Vision Screening: Recommended annual ophthalmology exams for early detection of glaucoma and other disorders of the eye. Is the patient up to date with their annual eye exam?  Yes  Who is the provider or what is the name of the office in which the patient attends annual eye exams? [facility] If pt is not established with a provider, would they like to be referred to a provider to establish care? No .   Dental Screening: Recommended annual dental exams for proper oral hygiene    Community Resource Referral / Chronic Care Management: CRR required this visit?  No   CCM required this visit?  No     Plan:     I have personally reviewed and noted the following in the patient's chart:   Medical and social history Use of alcohol, tobacco or illicit drugs  Current medications and supplements including opioid prescriptions. Patient is not currently taking opioid prescriptions. Functional ability and status Nutritional status Physical activity Advanced directives List of other physicians Hospitalizations, surgeries, and ER visits in previous 12 months Vitals Screenings to include cognitive, depression, and falls Referrals and appointments  In addition, I have reviewed and discussed with patient certain preventive protocols, quality metrics, and best practice recommendations. A written personalized care plan for preventive services as well as general preventive health recommendations were provided to patient.     Sydell Axon, LPN   10/20/1094   After Visit Summary: (MyChart) Due to this being a telephonic visit, the after  visit summary with patients personalized plan was offered to patient via MyChart   Nurse Notes: Patient had a shaky spell 04/15/23 and checked her blood sugar fasting and it was 115.

## 2023-04-22 ENCOUNTER — Telehealth: Payer: Self-pay | Admitting: Family

## 2023-04-22 NOTE — Telephone Encounter (Signed)
Call pt  Please offer an appointment to discuss "shaky spell" which she described in Medicare wellness visit.   See notes:  Nurse Notes: Patient had a shaky spell 04/15/23 and checked her blood sugar fasting and it was 115.

## 2023-04-24 NOTE — Telephone Encounter (Signed)
LVM to call back to schedule appt to discuss "shaky spells" as described in [facility]

## 2023-04-24 NOTE — Telephone Encounter (Signed)
Scheduled appt for 04/29/23

## 2023-04-25 DIAGNOSIS — Z86018 Personal history of other benign neoplasm: Secondary | ICD-10-CM | POA: Diagnosis not present

## 2023-04-25 DIAGNOSIS — Z872 Personal history of diseases of the skin and subcutaneous tissue: Secondary | ICD-10-CM | POA: Diagnosis not present

## 2023-04-25 DIAGNOSIS — L578 Other skin changes due to chronic exposure to nonionizing radiation: Secondary | ICD-10-CM | POA: Diagnosis not present

## 2023-04-25 DIAGNOSIS — L570 Actinic keratosis: Secondary | ICD-10-CM | POA: Diagnosis not present

## 2023-04-25 DIAGNOSIS — L718 Other rosacea: Secondary | ICD-10-CM | POA: Diagnosis not present

## 2023-04-29 ENCOUNTER — Encounter: Payer: Self-pay | Admitting: Family

## 2023-04-29 ENCOUNTER — Ambulatory Visit (INDEPENDENT_AMBULATORY_CARE_PROVIDER_SITE_OTHER): Payer: Medicare Other | Admitting: Family

## 2023-04-29 ENCOUNTER — Ambulatory Visit: Payer: Medicare Other | Attending: Family

## 2023-04-29 VITALS — BP 128/78 | HR 105 | Temp 97.7°F | Ht 67.5 in | Wt 149.8 lb

## 2023-04-29 DIAGNOSIS — I471 Supraventricular tachycardia, unspecified: Secondary | ICD-10-CM

## 2023-04-29 DIAGNOSIS — R531 Weakness: Secondary | ICD-10-CM | POA: Diagnosis not present

## 2023-04-29 NOTE — Progress Notes (Signed)
Assessment & Plan:  Weakness Assessment & Plan: Episode nonspecific.  Patient has history supraventricular tachycardia.  Compliant with Bystolic 5 mg daily.  I ordered Zio for evaluation for super imposed arrhythmia or sustained tachycardia while she is having an episode.  Pending echocardiogram, ultrasound carotid.  Alternatively we discussed deconditioning, stamina . close follow-up  Orders: -     TSH; Future -     VITAMIN D 25 Hydroxy (Vit-D Deficiency, Fractures); Future -     CBC with Differential/Platelet; Future -     Comprehensive metabolic panel; Future -     B12 and Folate Panel; Future -     Lipid panel; Future -     ECHOCARDIOGRAM COMPLETE; Future -     LONG TERM MONITOR (3-14 DAYS); Future -     US Carotid Bilateral; Future -     Iron, TIBC and Ferritin Panel; Future  Paroxysmal supraventricular tachycardia (HCC) Assessment & Plan: Blood pressure well controlled today.  HR 105. Advised to continue Bystolic 5 mg qd and take additional 5mg  dose as needed as directed by Dr. Mariah Milling.  will follow      Return precautions given.   Risks, benefits, and alternatives of the medications and treatment plan prescribed today were discussed, and patient expressed understanding.   Education regarding symptom management and diagnosis given to patient on AVS either electronically or printed.  Return in about 2 months (around 06/30/2023) for Fasting labs in 2-3 weeks.  Rennie Plowman, FNP  Subjective:    Patient ID: Meghan Mack, female    DOB: August 21, 1951, 72 y.o.   MRN: [id number]  CC: Meghan Mack is a 72 y.o. female who presents today for follow up.   HPI: She complains of episodes of 'weakness' for several months. Symptom is episodic.   More likely to be in the morning prior to breakfast.  Example is when fixing coffee and then feels the need to 'sit down' for 30-45 minutes and then she is able to resume activity. She checked glucose during event , BS 115.    Some days she doesn't have episode of weakness.   No associated palpitations, vision changes, confusion, LOC, syncope, nausea, CP  No routine exercise.  She takes bystolic 5mg  every day. Very rare palpitations.   CT calcium score 7/17/223; dr Mariah Milling mildy elevated  Mild OSA- she doesn't use Cipap.    Allergies: Diflucan [fluconazole], Tdap [tetanus-diphth-acell pertussis], Codeine, Diphth-acell pertussis-tetanus, Ezetimibe, Propantheline, Propantheline bromide, Propoxyphene, Sulfa antibiotics, Sulfonamide derivatives, Esomeprazole magnesium, Gadolinium derivatives, Meperidine hcl, Pneumovax 23 [pneumococcal vac polyvalent], and Propoxyphene n-acetaminophen Current Outpatient Medications on File Prior to Visit  Medication Sig Dispense Refill   BYSTOLIC 5 MG tablet TAKE 1 TABLET (5 MG TOTAL) BY MOUTH DAILY. MAY TAKE EXTRA 1 TABLET (5 MG )AS NEEDED FOR TACHYCARDIA 100 tablet 3   ibuprofen (ADVIL,MOTRIN) 600 MG tablet Take 1 tablet (600 mg total) by mouth every 8 (eight) hours as needed. 20 tablet 0   latanoprost (XALATAN) 0.005 % ophthalmic solution SMARTSIG:In Eye(s)     levothyroxine (SYNTHROID, LEVOTHROID) 88 MCG tablet Take 1 tablet 6 days a week.     Multiple Vitamin (MULTIVITAMIN PO) Take 1 tablet by mouth daily.     simvastatin (ZOCOR) 20 MG tablet TAKE 1 TABLET BY MOUTH EVERY DAY IN THE EVENING 90 tablet 3   triamcinolone (KENALOG) 0.025 % cream Apply 1 application. topically as directed. Qd to bid aa chest, shoulders until clear then prn flares, avoid face, groin, axilla 30  g 2   doxycycline (VIBRAMYCIN) 50 MG capsule Take 2 capsules (100 mg total) by mouth daily. Take with food. (Patient not taking: Reported on 04/29/2023) 60 capsule 2   ergocalciferol (VITAMIN D2) 50000 UNITS capsule Take 50,000 Units by mouth every 30 (thirty) days.     Fluocinolone Acetonide 0.01 % OIL Apply 1-2 drops up to twice daily to ears as needed for itching/irritation (Patient not taking: Reported on  04/17/2023) 20 mL 2   Ivermectin (SOOLANTRA) 1 % CREA Apply to face once a day for rosacea. 45 g 2   No current facility-administered medications on file prior to visit.    Review of Systems  Constitutional:  Negative for chills and fever.  Respiratory:  Negative for cough.   Cardiovascular:  Negative for chest pain and palpitations.  Gastrointestinal:  Negative for nausea and vomiting.  Neurological:  Positive for weakness. Negative for dizziness and syncope.      Objective:    BP 128/78   Pulse (!) 105   Temp 97.7 F (36.5 C)   Ht 5' 7.5" (1.715 m)   Wt 149 lb 12.8 oz (67.9 kg)   SpO2 97%   BMI 23.12 kg/m  BP Readings from Last 3 Encounters:  04/29/23 128/78  02/04/23 115/78  11/14/22 (!) 139/90   Wt Readings from Last 3 Encounters:  04/29/23 149 lb 12.8 oz (67.9 kg)  04/17/23 148 lb (67.1 kg)  10/26/22 149 lb 3.2 oz (67.7 kg)    Physical Exam Vitals reviewed.  Constitutional:      Appearance: She is well-developed.  Eyes:     Conjunctiva/sclera: Conjunctivae normal.  Cardiovascular:     Rate and Rhythm: Normal rate and regular rhythm.     Pulses: Normal pulses.     Heart sounds: Normal heart sounds.  Pulmonary:     Effort: Pulmonary effort is normal.     Breath sounds: Normal breath sounds. No wheezing, rhonchi or rales.  Skin:    General: Skin is warm and dry.  Neurological:     Mental Status: She is alert.  Psychiatric:        Speech: Speech normal.        Behavior: Behavior normal.        Thought Content: Thought content normal.

## 2023-04-29 NOTE — Assessment & Plan Note (Addendum)
Blood pressure well controlled today.  HR 105. Advised to continue Bystolic 5 mg qd and take additional 5mg  dose as needed as directed by Dr. Mariah Milling.  will follow

## 2023-04-29 NOTE — Assessment & Plan Note (Addendum)
Episode nonspecific.  Patient has history supraventricular tachycardia.  Compliant with Bystolic 5 mg daily.  I ordered Zio for evaluation for super imposed arrhythmia or sustained tachycardia while she is having an episode.  Pending echocardiogram, ultrasound carotid.  Alternatively we discussed deconditioning, stamina . close follow-up

## 2023-04-29 NOTE — Patient Instructions (Signed)
I have ordered echocardiogram and ultrasound of the carotids  We will call you to schedule this  Let us know if you dont hear back within a week in regards to an appointment being scheduled.   So that you are aware, if you are [facility] MyChart user , please pay attention to your MyChart messages as you may receive a MyChart message with a phone number to call and schedule this test/appointment own your own from our referral coordinator. This is a new process so I do not want you to miss this message.  If you are not a MyChart user, you will receive a phone call.    I have ordered a 14 day Zio monitor which will mailed directly to you. The device will include instructions on how to apply.   The Zio 14 day monitor that we use DOES NOT have 24 hour live monitoring. The rhythm of your heart will not be monitored while you are wearing it. Only AFTER you mail the device back in and a cardiologist interprets the data will we know if an underlying cardiac arrhythmia is going on.   There are models that offer 24 hour live monitoring however NOT this one. I wanted to be sure that you were aware of this as certainly didn't want this to be a false sense of security.   If you experience chest pain, shortness of breath, left arm numbness, or sustained, more frequent palpitations , do not wait and call 911 right away. We are in a delicate period of work up regarding palpitations and until we are sure that you do not have an underlying arrhythmia, you must be extremely vigilant and cautious.      ZIO XT- Long Term Monitor Instructions   Your provider has requested you wear a ZIO patch monitor for 14 days.  This is a single patch monitor. [employer] supplies one patch monitor per enrollment. Additional stickers are not available. Please do not apply patch if you will be having a Nuclear Stress Test,  Echocardiogram, Cardiac CT, MRI, or Chest Xray during the period you would be wearing the  monitor. The patch cannot  be worn during these tests. You cannot remove and re-apply the  ZIO XT patch monitor.  Your ZIO patch monitor will be mailed 3 day USPS to your address on file. It may take 3-5 days  to receive your monitor after you have been enrolled.  Once you have received your monitor, please review the enclosed instructions. Your monitor  has already been registered assigning a specific monitor serial # to you.   Billing and Patient Assistance Program Information   We have supplied [employer] with any of your insurance information on file for billing purposes. [employer] offers a sliding scale Patient Assistance Program for patients that do not have  insurance, or whose insurance does not completely cover the cost of the ZIO monitor.  You must apply for the Patient Assistance Program to qualify for this discounted rate.  To apply, please call [employer] at [phone], select option 4, select option 2, ask to apply for  Patient Assistance Program. Meredeth Ide will ask your household income, and how many people  are in your household. They will quote your out-of-pocket cost based on that information.  [employer] will also be able to set up a 5-month, interest-free payment plan if needed.   Applying the monitor   Shave hair from upper left chest.  Hold abrader disc by orange tab. Rub abrader in 40 strokes  over the upper left chest as  indicated in your monitor instructions.  Clean area with 4 enclosed alcohol pads. Let dry.  Apply patch as indicated in monitor instructions. Patch will be placed under collarbone on left  side of chest with arrow pointing upward.  Rub patch adhesive wings for 2 minutes. Remove white label marked "1". Remove the white  label marked "2". Rub patch adhesive wings for 2 additional minutes.  While looking in a mirror, press and release button in center of patch. A small green light will  flash 3-4 times. This will be your only indicator that the monitor has been turned on.  Do not  shower for the first 24 hours. You may shower after the first 24 hours.  Press the button if you feel a symptom. You will hear a small click. Record Date, Time and  Symptom in the Patient Logbook.  When you are ready to remove the patch, follow instructions on the last 2 pages of Patient  Logbook. Stick patch monitor onto the last page of Patient Logbook.  Place Patient Logbook in the blue and white box. Use locking tab on box and tape box closed  securely. The blue and white box has prepaid postage on it. Please place it in the mailbox as  soon as possible. Your physician should have your test results approximately 7 days after the  monitor has been mailed back to [facility].  Call [facility] Customer Care at [phone] if you have questions regarding  your ZIO XT patch monitor. Call them immediately if you see an orange light blinking on your  monitor.  If your monitor falls off in less than 4 days, contact our Monitor department at [phone].  If your monitor becomes loose or falls off after 4 days call [employer] at [phone] for  suggestions on securing your monitor

## 2023-04-30 ENCOUNTER — Other Ambulatory Visit (INDEPENDENT_AMBULATORY_CARE_PROVIDER_SITE_OTHER): Payer: Medicare Other

## 2023-04-30 DIAGNOSIS — R531 Weakness: Secondary | ICD-10-CM | POA: Diagnosis not present

## 2023-04-30 LAB — COMPREHENSIVE METABOLIC PANEL
ALT: 21 U/L (ref 0–35)
AST: 23 U/L (ref 0–37)
Albumin: 4.2 g/dL (ref 3.5–5.2)
Alkaline Phosphatase: 110 U/L (ref 39–117)
BUN: 12 mg/dL (ref 6–23)
CO2: 27 mEq/L (ref 19–32)
Calcium: 9.5 mg/dL (ref 8.4–10.5)
Chloride: 103 mEq/L (ref 96–112)
Creatinine, Ser: 0.78 mg/dL (ref 0.40–1.20)
GFR: 75.48 mL/min (ref >60.00)
Glucose, Bld: 97 mg/dL (ref 70–99)
Potassium: 4.3 mEq/L (ref 3.5–5.1)
Sodium: 137 mEq/L (ref 135–145)
Total Bilirubin: 0.9 mg/dL (ref 0.2–1.2)
Total Protein: 6.7 g/dL (ref 6.0–8.3)

## 2023-04-30 LAB — LIPID PANEL
Cholesterol: 161 mg/dL (ref 0–200)
HDL: 74.9 mg/dL (ref >39.00)
LDL Cholesterol: 69 mg/dL (ref 0–99)
NonHDL: 86.2
Total CHOL/HDL Ratio: 2
Triglycerides: 86 mg/dL (ref 0.0–149.0)
VLDL: 17.2 mg/dL (ref 0.0–40.0)

## 2023-04-30 LAB — CBC WITH DIFFERENTIAL/PLATELET
Basophils Absolute: 0 10*3/uL (ref 0.0–0.1)
Basophils Relative: 0.7 % (ref 0.0–3.0)
Eosinophils Absolute: 0.1 10*3/uL (ref 0.0–0.7)
Eosinophils Relative: 1.8 % (ref 0.0–5.0)
HCT: 39.9 % (ref 36.0–46.0)
Hemoglobin: 13.5 g/dL (ref 12.0–15.0)
Lymphocytes Relative: 29.1 % (ref 12.0–46.0)
Lymphs Abs: 1.4 10*3/uL (ref 0.7–4.0)
MCHC: 33.8 g/dL (ref 30.0–36.0)
MCV: 97.9 fl (ref 78.0–100.0)
Monocytes Absolute: 0.3 10*3/uL (ref 0.1–1.0)
Monocytes Relative: 6.7 % (ref 3.0–12.0)
Neutro Abs: 3 10*3/uL (ref 1.4–7.7)
Neutrophils Relative %: 61.7 % (ref 43.0–77.0)
Platelets: 200 10*3/uL (ref 150.0–400.0)
RBC: 4.07 Mil/uL (ref 3.87–5.11)
RDW: 12.8 % (ref 11.5–15.5)
WBC: 4.9 10*3/uL (ref 4.0–10.5)

## 2023-04-30 LAB — B12 AND FOLATE PANEL
Folate: >23.8 ng/mL (ref >5.9)
Vitamin B-12: 435 pg/mL (ref 211–911)

## 2023-04-30 LAB — TSH: TSH: 1.16 u[IU]/mL (ref 0.35–5.50)

## 2023-04-30 LAB — VITAMIN D 25 HYDROXY (VIT D DEFICIENCY, FRACTURES): VITD: 49.9 ng/mL (ref 30.00–100.00)

## 2023-05-01 LAB — IRON,TIBC AND FERRITIN PANEL
%SAT: 43 % (calc) (ref 16–45)
Ferritin: 59 ng/mL (ref 16–288)
Iron: 144 ug/dL (ref 45–160)
TIBC: 334 mcg/dL (calc) (ref 250–450)

## 2023-05-03 ENCOUNTER — Ambulatory Visit
Admission: RE | Admit: 2023-05-03 | Discharge: 2023-05-03 | Disposition: A | Discharge status: Home / Self Care | Payer: Medicare Other | Source: Ambulatory Visit | Attending: Family | Admitting: Family

## 2023-05-03 DIAGNOSIS — I10 Essential (primary) hypertension: Secondary | ICD-10-CM | POA: Diagnosis not present

## 2023-05-03 DIAGNOSIS — R531 Weakness: Secondary | ICD-10-CM | POA: Insufficient documentation

## 2023-05-06 ENCOUNTER — Encounter: Payer: Self-pay | Admitting: Family

## 2023-05-08 DIAGNOSIS — R531 Weakness: Secondary | ICD-10-CM | POA: Diagnosis not present

## 2023-05-08 DIAGNOSIS — I471 Supraventricular tachycardia, unspecified: Secondary | ICD-10-CM

## 2023-05-08 NOTE — Telephone Encounter (Signed)
Pt is aware.  

## 2023-05-27 ENCOUNTER — Ambulatory Visit
Admission: RE | Admit: 2023-05-27 | Discharge: 2023-05-27 | Disposition: A | Discharge status: Home / Self Care | Payer: Medicare Other | Source: Ambulatory Visit | Attending: Family | Admitting: Family

## 2023-05-27 DIAGNOSIS — R531 Weakness: Secondary | ICD-10-CM | POA: Diagnosis not present

## 2023-05-27 DIAGNOSIS — I361 Nonrheumatic tricuspid (valve) insufficiency: Secondary | ICD-10-CM

## 2023-05-27 DIAGNOSIS — I341 Nonrheumatic mitral (valve) prolapse: Secondary | ICD-10-CM | POA: Insufficient documentation

## 2023-05-27 DIAGNOSIS — R000 Tachycardia, unspecified: Secondary | ICD-10-CM | POA: Diagnosis not present

## 2023-05-27 LAB — ECHOCARDIOGRAM COMPLETE
AR max vel: 2.64 cm2
AV Area VTI: 2.85 cm2
AV Area mean vel: 2.83 cm2
AV Mean grad: 2 mmHg
AV Peak grad: 4.5 mmHg
Ao pk vel: 1.06 m/s
Area-P 1/2: 3.4 cm2
MV VTI: 2.6 cm2
S' Lateral: 2.7 cm

## 2023-05-27 NOTE — Progress Notes (Signed)
*  PRELIMINARY RESULTS* Echocardiogram 2D Echocardiogram has been performed.  Carolyne Fiscal 05/27/2023, 10:57 AM

## 2023-05-28 ENCOUNTER — Encounter: Payer: Self-pay | Admitting: Family

## 2023-05-28 ENCOUNTER — Ambulatory Visit: Payer: Self-pay

## 2023-05-28 NOTE — Patient Outreach (Signed)
  Care Coordination   Initial Visit Note   05/28/2023 Name: Meghan Mack MRN: [id number] DOB: 1951-06-27  Meghan Mack is a 72 y.o. year old female who sees Meghan Mack, Meghan Records, FNP for primary care. I spoke with  Meghan Mack by phone today.  What matters to the patients health and wellness today?  CM educated patient on Meghan Mack services.  Patient declines services and feels that she is able to manage her medical needs.  Patient agreed to contact her provider in the future if she needs [facility] services.    Goals Addressed   None     SDOH assessments and interventions completed:  No     Care Coordination Interventions:  Yes, provided    Interventions Today    Flowsheet Row Most Recent Value  General Interventions   General Interventions Discussed/Reviewed General Interventions Discussed  Education Interventions   Education Provided Provided Education  [Educated on [facility]]  Provided Verbal Education On [employer]  [Extra benefits program through insurance coverage]       Follow up plan: No further intervention required.   Encounter Outcome:  Pt. Visit Completed

## 2023-05-30 ENCOUNTER — Telehealth: Payer: Self-pay | Admitting: Cardiovascular Disease

## 2023-05-30 ENCOUNTER — Encounter (INDEPENDENT_AMBULATORY_CARE_PROVIDER_SITE_OTHER): Payer: Self-pay

## 2023-05-30 NOTE — Telephone Encounter (Signed)
Patient would like a call back to discuss monitor results.  Patient is seeing Dr. Mariah Milling on 8/26(per Dr. Lalla Brothers, patient needs to follow up with Dr. Mariah Milling based in monitor results) and would like to know why she needs to be seen sooner than October, which is her yearly appointment.

## 2023-05-30 NOTE — Telephone Encounter (Signed)
Spoke to patient and informed her that her PCP Rennie Plowman, FNP got the results of her ZIO monitor which stated she had rare supraventricular and ventricular ectopy but no atrial fibrillation. Her PCP wanted her to be evaluated sooner rather than later. Patient understood with read back

## 2023-06-03 DIAGNOSIS — E890 Postprocedural hypothyroidism: Secondary | ICD-10-CM | POA: Diagnosis not present

## 2023-06-03 DIAGNOSIS — M8580 Other specified disorders of bone density and structure, unspecified site: Secondary | ICD-10-CM | POA: Diagnosis not present

## 2023-06-03 DIAGNOSIS — E559 Vitamin D deficiency, unspecified: Secondary | ICD-10-CM | POA: Diagnosis not present

## 2023-06-08 NOTE — Progress Notes (Unsigned)
Cardiology Office Note  Date:  06/10/2023   ID:  Meghan, Mack 12/05/1950, MRN [id number]  PCP:  Allegra Grana, FNP   Chief Complaint  Patient presents with   Follow up Zio monitor     "Doing well." Medications reviewed by the patient verbally.     HPI:  [name] is a very pleasant 72 year old woman with a history of  palpitations,  anxiety,  remote history of chest pain,  hyperlipidemia  30 day monitor in 2014 showed APCs,short runs atrial tachycardia/SVT with rate 166  hypertension  mild coronary calcification of the LAD and RCA on CT Coronary calcium score of 183. MRI with moderate stenosis cerebral vascular disease who presents for routine followup  Of her coronary calcifications, Aortic atherosclerosis, tachycardia , peripheral arterial disease  LOV 10/23 In follow-up today reports feeling relatively well Zio monitor reviewed showing rare episodes short runs of SVT/atrial tachycardia  Appreciates rare palpitations No near syncope and syncope  On bystolic 5 daily, blood pressure runs low, unable to titrate upwards on a daily basis  Active at baseline  Labs reviewed Total chol 161, LDL 69  EKG personally reviewed by myself on todays visit EKG Interpretation Date/Time:  Monday June 10 2023 10:53:49 EDT Ventricular Rate:  88 PR Interval:  142 QRS Duration:  64 QT Interval:  344 QTC Calculation: 416 R Axis:   -1  Text Interpretation: Normal sinus rhythm Low voltage QRS Cannot rule out Anterior infarct (cited on or before 10-Jun-2023) When compared with ECG of 14-Nov-2005 07:42, No significant change was found Confirmed by Julien Nordmann [phone]) on 06/10/2023 11:11:38 AM   CT coronary calcium scoring July 2023 Atherosclerosis of thoracic aorta Coronary calcium score of 183. This was 72nd percentile for age and sex matched control. CAC 100-299 in LAD, RCA.  Husband passed, prostate cancer Compression hose in place for chronic lower  extremity lymphedema  Tolerating statin, simvastatin Previously on Zetia, stopped secondary to stomach issues  Prior CT scan reviewed showing mild coronary calcification LAD, RCA, aortic atherosclerosis mild  Past medical history reviewed MRI, was having sharp pain in head Moderate stenosis right P2 segment Moderate to severe stenosis left internal carotid artery at the skull base and moderate stenosis right internal carotid artery at the skull base. Mild atherosclerotic disease right anterior cerebral Artery.  PMH:   has a past medical history of Actinic keratosis, Anemia, Colonic polyp, Constipation, COPD (chronic obstructive pulmonary disease) (HCC), Hyperlipidemia, IBS (irritable bowel syndrome), Kidney cysts, Lung nodule, Meningioma (HCC), Nausea, Palpitations, and Tonsillar cyst.  PSH:    Past Surgical History:  Procedure Laterality Date   CARDIAC CATHETERIZATION  02/25/2007   COLONOSCOPY     ESOPHAGOGASTRODUODENOSCOPY  10/1997 & 08/2005   FOOT SURGERY  04/25/2007   right   THYROIDECTOMY  11/2005   VAGINAL HYSTERECTOMY     ovaries intact    Current Outpatient Medications  Medication Sig Dispense Refill   BYSTOLIC 5 MG tablet TAKE 1 TABLET (5 MG TOTAL) BY MOUTH DAILY. MAY TAKE EXTRA 1 TABLET (5 MG )AS NEEDED FOR TACHYCARDIA 100 tablet 3   ergocalciferol (VITAMIN D2) 50000 UNITS capsule Take 50,000 Units by mouth every 30 (thirty) days.     ibuprofen (ADVIL,MOTRIN) 600 MG tablet Take 1 tablet (600 mg total) by mouth every 8 (eight) hours as needed. 20 tablet 0   Ivermectin (SOOLANTRA) 1 % CREA Apply to face once a day for rosacea. 45 g 2   latanoprost (XALATAN) 0.005 % ophthalmic solution  SMARTSIG:In Eye(s)     levothyroxine (SYNTHROID, LEVOTHROID) 88 MCG tablet Take 1 tablet 6 days a week.     Multiple Vitamin (MULTIVITAMIN PO) Take 1 tablet by mouth daily.     simvastatin (ZOCOR) 20 MG tablet TAKE 1 TABLET BY MOUTH EVERY DAY IN THE EVENING 90 tablet 3   triamcinolone  (KENALOG) 0.025 % cream Apply 1 application. topically as directed. Qd to bid aa chest, shoulders until clear then prn flares, avoid face, groin, axilla 30 g 2   doxycycline (VIBRAMYCIN) 50 MG capsule Take 2 capsules (100 mg total) by mouth daily. Take with food. (Patient not taking: Reported on 04/29/2023) 60 capsule 2   Fluocinolone Acetonide 0.01 % OIL Apply 1-2 drops up to twice daily to ears as needed for itching/irritation (Patient not taking: Reported on 04/17/2023) 20 mL 2   No current facility-administered medications for this visit.    Allergies:   Diflucan [fluconazole], Tdap [tetanus-diphth-acell pertussis], Chlorpheniramine-pseudoeph, Codeine, Diphth-acell pertussis-tetanus, Ezetimibe, Propantheline, Propantheline bromide, Propoxyphene, Sulfa antibiotics, Sulfonamide derivatives, Tetanus antitoxin, Esomeprazole magnesium, Gadolinium derivatives, Meperidine hcl, Pneumococcal vaccine, Pneumovax 23 [pneumococcal vac polyvalent], and Propoxyphene n-acetaminophen   Social History:  The patient  reports that she quit smoking about 25 years ago. Her smoking use included cigarettes. She started smoking about 35 years ago. She has a 2.5 pack-year smoking history. She has never used smokeless tobacco. She reports that she does not drink alcohol and does not use drugs.   Family History:   family history includes Cancer in her brother, father, maternal aunt, maternal grandfather, and mother; Emphysema in her father; Heart disease in her father.    Review of Systems: Review of Systems  Constitutional: Negative.   HENT: Negative.    Respiratory: Negative.    Cardiovascular: Negative.   Gastrointestinal: Negative.   Musculoskeletal: Negative.   Neurological: Negative.   Psychiatric/Behavioral: Negative.    All other systems reviewed and are negative.   PHYSICAL EXAM: VS:  BP 130/70 (BP Location: Left Arm, Patient Position: Sitting, Cuff Size: Normal)   Pulse 88   Ht 5' 7.5" (1.715 m)   Wt  152 lb 2 oz (69 kg)   SpO2 98%   BMI 23.47 kg/m  , BMI Body mass index is 23.47 kg/m. Constitutional:  oriented to person, place, and time. No distress.  HENT:  Head: Grossly normal Eyes:  no discharge. No scleral icterus.  Neck: No JVD, no carotid bruits  Cardiovascular: Regular rate and rhythm, no murmurs appreciated Pulmonary/Chest: Clear to auscultation bilaterally, no wheezes or rails Abdominal: Soft.  no distension.  no tenderness.  Musculoskeletal: Normal range of motion Neurological:  normal muscle tone. Coordination normal. No atrophy Skin: Skin warm and dry  Psychiatric: normal affect, pleasant  Recent Labs: 04/30/2023: ALT 21; BUN 12; Creatinine, Ser 0.78; Hemoglobin 13.5; Platelets 200.0; Potassium 4.3; Sodium 137; TSH 1.16    Lipid Panel Lab Results  Component Value Date   CHOL 161 04/30/2023   HDL 74.90 04/30/2023   LDLCALC 69 04/30/2023   TRIG 86.0 04/30/2023      Wt Readings from Last 3 Encounters:  06/10/23 152 lb 2 oz (69 kg)  04/29/23 149 lb 12.8 oz (67.9 kg)  04/17/23 148 lb (67.1 kg)     ASSESSMENT AND PLAN:  Peripheral arterial disease Mild aortic atherosclerosis Prior history of smoking Cholesterol at goal, on simvastatin, could not tolerate Zetia  Tachycardia - stay on bytolic 5 mg daily  Extra beta-blocker as needed for bad days For worsening  symptoms on a regular basis may need to add antiarrhythmic such as flecainide given low blood pressure   Obstructive sleep apnea - Reports sleeping relatively well Weight trending up slightly  TOBACCO ABUSE - Plan: EKG 12-Lead Prior history of smoking Stopped smoking  Mixed hyperlipidemia - Plan: EKG 12-Lead Did not tolerate Zetia,  Continue simvastatin 20 for now, total cholesterol 160  CAD Mild coronary calcification and mild to moderate diffuse aortic atherosclerosis Calcium score discussed Last at goal, diabetes numbers low, A1c 5.6, stable   Total encounter time more than 30  minutes  Greater than 50% was spent in counseling and coordination of care with the patient    Orders Placed This Encounter  Procedures   EKG 12-Lead     Signed, Dossie Arbour, M.D., Ph.D. 06/10/2023  [facility] [place], [place] [phone]

## 2023-06-10 ENCOUNTER — Ambulatory Visit: Payer: Medicare Other | Attending: Cardiovascular Disease | Admitting: Cardiovascular Disease

## 2023-06-10 ENCOUNTER — Encounter: Payer: Self-pay | Admitting: Cardiovascular Disease

## 2023-06-10 ENCOUNTER — Telehealth: Payer: Self-pay | Admitting: Cardiovascular Disease

## 2023-06-10 VITALS — BP 130/70 | HR 88 | Ht 67.5 in | Wt 152.1 lb

## 2023-06-10 DIAGNOSIS — I471 Supraventricular tachycardia, unspecified: Secondary | ICD-10-CM | POA: Diagnosis not present

## 2023-06-10 DIAGNOSIS — G4733 Obstructive sleep apnea (adult) (pediatric): Secondary | ICD-10-CM

## 2023-06-10 DIAGNOSIS — E782 Mixed hyperlipidemia: Secondary | ICD-10-CM

## 2023-06-10 DIAGNOSIS — I2510 Atherosclerotic heart disease of native coronary artery without angina pectoris: Secondary | ICD-10-CM | POA: Diagnosis not present

## 2023-06-10 DIAGNOSIS — I739 Peripheral vascular disease, unspecified: Secondary | ICD-10-CM

## 2023-06-10 DIAGNOSIS — R531 Weakness: Secondary | ICD-10-CM

## 2023-06-10 DIAGNOSIS — F17200 Nicotine dependence, unspecified, uncomplicated: Secondary | ICD-10-CM

## 2023-06-10 DIAGNOSIS — I700 Atherosclerosis of aorta: Secondary | ICD-10-CM

## 2023-06-10 MED ORDER — SIMVASTATIN 20 MG PO TABS: ORAL_TABLET | ORAL | 3 refills | Status: DC

## 2023-06-10 MED ORDER — BYSTOLIC 5 MG PO TABS: ORAL_TABLET | ORAL | 3 refills | Status: DC

## 2023-06-10 NOTE — Patient Instructions (Signed)

## 2023-06-10 NOTE — Telephone Encounter (Signed)
Called patient, advised that during her EKG it showed some things she was not aware of. Answered all questions and concerns. Patient just wanted to make sure nothing was wrong with the EKG from today. Advised that MD reviewed and it was no changes from previous. Patient verbalized understanding, thankful for call back

## 2023-06-10 NOTE — Telephone Encounter (Signed)
Patient has a question about the EKG that was done today.

## 2023-06-11 ENCOUNTER — Telehealth: Payer: Self-pay | Admitting: Cardiovascular Disease

## 2023-06-11 NOTE — Telephone Encounter (Signed)
Called patient and notified her of the following from Dr. Mariah Milling.   echo reviewed, No concerning findings noted, overall looks normal Thx TG   Patient verbalizes understanding.

## 2023-06-11 NOTE — Telephone Encounter (Signed)
Will route to MD to review.  ECHO is in the chart.  Thanks!

## 2023-06-11 NOTE — Telephone Encounter (Signed)
  Patient is calling to inquire if Dr. Mariah Milling has received her echo results from her pcp. She would appreciate it if Dr. Mariah Milling could review and share his observations on the results

## 2023-06-12 DIAGNOSIS — E559 Vitamin D deficiency, unspecified: Secondary | ICD-10-CM | POA: Diagnosis not present

## 2023-06-12 DIAGNOSIS — M8580 Other specified disorders of bone density and structure, unspecified site: Secondary | ICD-10-CM | POA: Diagnosis not present

## 2023-06-12 DIAGNOSIS — E890 Postprocedural hypothyroidism: Secondary | ICD-10-CM | POA: Diagnosis not present

## 2023-07-02 ENCOUNTER — Ambulatory Visit: Payer: Medicare Other | Admitting: Family

## 2023-07-02 DIAGNOSIS — M25871 Other specified joint disorders, right ankle and foot: Secondary | ICD-10-CM | POA: Diagnosis not present

## 2023-07-02 DIAGNOSIS — M216X1 Other acquired deformities of right foot: Secondary | ICD-10-CM | POA: Diagnosis not present

## 2023-07-02 DIAGNOSIS — L909 Atrophic disorder of skin, unspecified: Secondary | ICD-10-CM | POA: Diagnosis not present

## 2023-07-02 DIAGNOSIS — B351 Tinea unguium: Secondary | ICD-10-CM | POA: Diagnosis not present

## 2023-07-02 DIAGNOSIS — M778 Other enthesopathies, not elsewhere classified: Secondary | ICD-10-CM | POA: Diagnosis not present

## 2023-07-02 DIAGNOSIS — L600 Ingrowing nail: Secondary | ICD-10-CM | POA: Diagnosis not present

## 2023-07-16 ENCOUNTER — Ambulatory Visit (INDEPENDENT_AMBULATORY_CARE_PROVIDER_SITE_OTHER): Payer: Medicare Other

## 2023-07-16 DIAGNOSIS — Z23 Encounter for immunization: Secondary | ICD-10-CM

## 2023-07-19 ENCOUNTER — Ambulatory Visit: Payer: Medicare Other | Admitting: Cardiovascular Disease

## 2023-07-22 ENCOUNTER — Encounter: Payer: Medicare Other | Admitting: Family

## 2023-07-25 DIAGNOSIS — H40003 Preglaucoma, unspecified, bilateral: Secondary | ICD-10-CM | POA: Diagnosis not present

## 2023-07-25 DIAGNOSIS — H2513 Age-related nuclear cataract, bilateral: Secondary | ICD-10-CM | POA: Diagnosis not present

## 2023-07-25 DIAGNOSIS — H43813 Vitreous degeneration, bilateral: Secondary | ICD-10-CM | POA: Diagnosis not present

## 2023-07-26 ENCOUNTER — Telehealth: Payer: Self-pay | Admitting: Family

## 2023-07-26 DIAGNOSIS — I8390 Asymptomatic varicose veins of unspecified lower extremity: Secondary | ICD-10-CM

## 2023-07-26 NOTE — Telephone Encounter (Signed)
Patient just called and said she needs a referral to [facility]. The address is [address] [place], [place] [address]. The number is [phone].

## 2023-07-30 NOTE — Telephone Encounter (Signed)
Spoke to pt and she stated that she had seen them a long time ago and would like the referral because shehas some varicose veins that are getting out of control and she wants them to look at them

## 2023-07-30 NOTE — Telephone Encounter (Signed)
Call pt I am happy to place referral however need more information as to why.   I dont see that she has ever seen vascular  Can pt provide context here?

## 2023-07-31 NOTE — Telephone Encounter (Signed)
Pt IS AWARE

## 2023-08-06 ENCOUNTER — Ambulatory Visit (INDEPENDENT_AMBULATORY_CARE_PROVIDER_SITE_OTHER): Payer: Medicare Other | Admitting: Dermatology

## 2023-08-06 VITALS — BP 110/72 | HR 89

## 2023-08-06 DIAGNOSIS — L719 Rosacea, unspecified: Secondary | ICD-10-CM | POA: Diagnosis not present

## 2023-08-06 DIAGNOSIS — L821 Other seborrheic keratosis: Secondary | ICD-10-CM | POA: Diagnosis not present

## 2023-08-06 DIAGNOSIS — Z79899 Other long term (current) drug therapy: Secondary | ICD-10-CM

## 2023-08-06 DIAGNOSIS — L820 Inflamed seborrheic keratosis: Secondary | ICD-10-CM

## 2023-08-06 MED ORDER — DOXYCYCLINE HYCLATE 20 MG PO TABS: 20.0000 mg | ORAL_TABLET | Freq: Two times a day (BID) | ORAL | 5 refills | Status: DC

## 2023-08-06 NOTE — Patient Instructions (Addendum)
Cryotherapy Aftercare  Wash gently with soap and water everyday.   Apply Vaseline and Band-Aid daily until healed.     Doxycycline should be taken with food to prevent nausea. Do not lay down for 30 minutes after taking. Be cautious with sun exposure and use good sun protection while on this medication. Pregnant women should not take this medication.    Rosacea  What is rosacea? Rosacea (say: ro-zay-sha) is a common skin disease that usually begins as a trend of flushing or blushing easily.  As rosacea progresses, a persistent redness in the center of the face will develop and may gradually spread beyond the nose and cheeks to the forehead and chin.  In some cases, the ears, chest, and back could be affected.  Rosacea may appear as tiny blood vessels or small red bumps that occur in crops.  Frequently they can contain pus, and are called "pustules".  If the bumps do not contain pus, they are referred to as "papules".  Rarely, in prolonged, untreated cases of rosacea, the oil glands of the nose and cheeks may become permanently enlarged.  This is called rhinophyma, and is seen more frequently in men.  Signs and Risks In its beginning stages, rosacea tends to come and go, which makes it difficult to recognize.  It can start as intermittent flushing of the face.  Eventually, blood vessels may become permanently visible.  Pustules and papules can appear, but can be mistaken for adult acne.  People of all races, ages, genders and ethnic groups are at risk of developing rosacea.  However, it is more common in women (especially around menopause) and adults with fair skin between the ages of 64 and 34.  Treatment Dermatologists typically recommend a combination of treatments to effectively manage rosacea.  Treatment can improve symptoms and may stop the progression of the rosacea.  Treatment may involve both topical and oral medications.  The tetracycline antibiotics are often used for their  anti-inflammatory effect; however, because of the possibility of developing antibiotic resistance, they should not be used long term at full dose.  For dilated blood vessels the options include electrodessication (uses electric current through a small needle), laser treatment, and cosmetics to hide the redness.   With all forms of treatment, improvement is a slow process, and patients may not see any results for the first 3-4 weeks.  It is very important to avoid the sun and other triggers.  Patients must wear sunscreen daily.  Skin Care Instructions: Cleanse the skin with a mild soap such as CeraVe cleanser, Cetaphil cleanser, or Dove soap once or twice daily as needed. Moisturize with Eucerin Redness Relief Daily Perfecting Lotion (has a subtle green tint), CeraVe Moisturizing Cream, or Oil of Olay Daily Moisturizer with sunscreen every morning and/or night as recommended. Makeup should be "non-comedogenic" (won't clog pores) and be labeled "for sensitive skin". Good choices for cosmetics are: Neutrogena, Almay, and Physician's Formula.  Any product with a green tint tends to offset a red complexion. If your eyes are dry and irritated, use artificial tears 2-3 times per day and cleanse the eyelids daily with baby shampoo.  Have your eyes examined at least every 2 years.  Be sure to tell your eye doctor that you have rosacea. Alcoholic beverages tend to cause flushing of the skin, and may make rosacea worse. Always wear sunscreen, protect your skin from extreme hot and cold temperatures, and avoid spicy foods, hot drinks, and mechanical irritation such as rubbing, scrubbing, or  massaging the face.  Avoid harsh skin cleansers, cleansing masks, astringents, and exfoliation. If a particular product burns or makes your face feel tight, then it is likely to flare your rosacea. If you are having difficulty finding a sunscreen that you can tolerate, you may try switching to a chemical-free sunscreen.  These are  ones whose active ingredient is zinc oxide or titanium dioxide only.  They should also be fragrance free, non-comedogenic, and labeled for sensitive skin. Rosacea triggers may vary from person to person.  There are a variety of foods that have been reported to trigger rosacea.  Some patients find that keeping a diary of what they were doing when they flared helps them avoid triggers.   Due to recent changes in healthcare laws, you may see results of your pathology and/or laboratory studies on MyChart before the doctors have had a chance to review them. We understand that in some cases there may be results that are confusing or concerning to you. Please understand that not all results are received at the same time and often the doctors may need to interpret multiple results in order to provide you with the best plan of care or course of treatment. Therefore, we ask that you please give [place] 2 business days to thoroughly review all your results before contacting the office for clarification. Should we see a critical lab result, you will be contacted sooner.   If You Need Anything After Your Visit  If you have any questions or concerns for your doctor, please call our main line at [phone] and press option 4 to reach your doctor's medical assistant. If no one answers, please leave a voicemail as directed and we will return your call as soon as possible. Messages left after 4 pm will be answered the following business day.   You may also send [place] a message via MyChart. We typically respond to MyChart messages within 1-2 business days.  For prescription refills, please ask your pharmacy to contact our office. Our fax number is [phone].  If you have an urgent issue when the clinic is closed that cannot wait until the next business day, you can page your doctor at the number below.    Please note that while we do our best to be available for urgent issues outside of office hours, we are not available  24/7.   If you have an urgent issue and are unable to reach [place], you may choose to seek medical care at your doctor's office, retail clinic, urgent care center, or emergency room.  If you have a medical emergency, please immediately call 911 or go to the emergency department.  Pager Numbers  - Dr. Gwen Pounds: [phone]  - Dr. Roseanne Reno: [phone]  - Dr. Katrinka Blazing: [phone]   In the event of inclement weather, please call our main line at [phone] for an update on the status of any delays or closures.  Dermatology Medication Tips: Please keep the boxes that topical medications come in in order to help keep track of the instructions about where and how to use these. Pharmacies typically print the medication instructions only on the boxes and not directly on the medication tubes.   If your medication is too expensive, please contact our office at [phone] option 4 or send [place] a message through MyChart.   We are unable to tell what your co-pay for medications will be in advance as this is different depending on your insurance coverage. However, we may be able to find  a substitute medication at lower cost or fill out paperwork to get insurance to cover a needed medication.   If a prior authorization is required to get your medication covered by your insurance company, please allow [place] 1-2 business days to complete this process.  Drug prices often vary depending on where the prescription is filled and some pharmacies may offer cheaper prices.  The website www.goodrx.com contains coupons for medications through different pharmacies. The prices here do not account for what the cost may be with help from insurance (it may be cheaper with your insurance), but the website can give you the price if you did not use any insurance.  - You can print the associated coupon and take it with your prescription to the pharmacy.  - You may also stop by our office during regular business hours and pick  up a GoodRx coupon card.  - If you need your prescription sent electronically to a different pharmacy, notify our office through [facility] or by phone at [phone] option 4.     Si Usted Necesita Algo Despus de Su Visita  Tambin puede enviarnos un mensaje a travs de Clinical cytogeneticist. Por lo general respondemos a los mensajes de MyChart en el transcurso de 1 a 2 das hbiles.  Para renovar recetas, por favor pida a su farmacia que se ponga en contacto con nuestra oficina. Annie Sable de fax es [place] [phone].  Si tiene un asunto urgente cuando la clnica est cerrada y que no puede esperar hasta el siguiente da hbil, puede llamar/localizar a su doctor(a) al nmero que aparece a continuacin.   Por favor, tenga en cuenta que aunque hacemos todo lo posible para estar disponibles para asuntos urgentes fuera del horario de [place], no estamos disponibles las 24 horas del da, los 7 [address]  [address] de la [place].   Si tiene un problema urgente y no puede comunicarse con nosotros, puede optar por buscar atencin mdica  en el consultorio de su doctor(a), en una clnica privada, en un centro de atencin urgente o en una sala de emergencias.  Si tiene Engineer, drilling, por favor llame inmediatamente al 911 o vaya a la sala de emergencias.  Nmeros de bper  - Dr. Gwen Pounds: [phone]  - Dra. Roseanne Reno: [phone]  - Dr. Katrinka Blazing: [phone]   En caso de inclemencias del tiempo, por favor llame a Lacy Duverney principal al [phone] para una actualizacin sobre el [place] de cualquier retraso o cierre.  Consejos para la medicacin en dermatologa: Por favor, guarde las cajas en las que vienen los medicamentos de uso tpico para ayudarle a seguir las instrucciones sobre dnde y cmo usarlos. Las farmacias generalmente imprimen las instrucciones del medicamento slo en las cajas y no directamente en los tubos del [place].   Si su medicamento es muy caro, por favor, pngase en  contacto con Rolm Gala llamando al [phone] y presione la opcin 4 o envenos un mensaje a travs de Clinical cytogeneticist.   No podemos decirle cul ser su copago por los medicamentos por adelantado ya que esto es diferente dependiendo de la cobertura de su seguro. Sin embargo, es posible que podamos encontrar un medicamento sustituto a Audiological scientist un formulario para que el seguro cubra el medicamento que se considera necesario.   Si se requiere una autorizacin previa para que su compaa de seguros [place] su medicamento, por favor permtanos de 1 a 2 das hbiles para completar [address]  Los precios de los medicamentos varan con frecuencia dependiendo  del lugar de dnde se surte la receta y alguna farmacias pueden ofrecer precios ms baratos.  El sitio web www.goodrx.com tiene cupones para medicamentos de Health and safety inspector. Los precios aqu no tienen en cuenta lo que podra costar con la ayuda del seguro (puede ser ms barato con su seguro), pero el sitio web puede darle el precio si no utiliz Tourist information centre manager.  - Puede imprimir el cupn correspondiente y llevarlo con su receta a la farmacia.  - Tambin puede pasar por nuestra oficina durante el horario de atencin regular y Education officer, museum una tarjeta de cupones de GoodRx.  - Si necesita que su receta se enve electrnicamente a una farmacia diferente, informe a nuestra oficina a travs de MyChart de [place] o por telfono llamando al [phone] y presione la opcin 4.

## 2023-08-06 NOTE — Progress Notes (Signed)
Follow-Up Visit   Subjective  Meghan Mack is a 72 y.o. female who presents for the following: Rosacea of the face, much improved with metronidazole 0.75% gel. She has doxycycline 50 MG to take as needed for flares- she gets yeast infections when she uses it. She has a new growth on her back that is itchy and growing. She has another growth on the left temple hairline (not bothersome) and chest (irritated) she would like checked.    The following portions of the chart were reviewed this encounter and updated as appropriate: medications, allergies, medical history  Review of Systems:  No other skin or systemic complaints except as noted in HPI or Assessment and Plan.  Objective  Well appearing patient in no apparent distress; mood and affect are within normal limits.  Areas Examined: face  Relevant physical exam findings are noted in the Assessment and Plan.  L medial clavicle x 1, R lat eyebrow x 1, L mid lower back x 6 (8) Erythematous stuck-on, waxy papule or plaque    Assessment & Plan   Inflamed seborrheic keratosis (8) L medial clavicle x 1, R lat eyebrow x 1, L mid lower back x 6  Symptomatic, irritating, patient would like treated.  Destruction of lesion - L medial clavicle x 1, R lat eyebrow x 1, L mid lower back x 6 (8)  Destruction method: cryotherapy   Informed consent: discussed and consent obtained   Lesion destroyed using liquid nitrogen: Yes   Region frozen until ice ball extended beyond lesion: Yes   Outcome: patient tolerated procedure well with no complications   Post-procedure details: wound care instructions given   Additional details:  Prior to procedure, discussed risks of blister formation, small wound, skin dyspigmentation, or rare scar following cryotherapy. Recommend Vaseline ointment to treated areas while healing.   SEBORRHEIC KERATOSIS - Stuck-on, waxy, tan-brown papules and/or plaques, including left temple hairline  -  Benign-appearing - Discussed benign etiology and prognosis. - Observe - Call for any changes   ROSACEA  Exam:  Mild erythema of the nose, with few small pink papules.  Chronic and persistent condition with duration or expected duration over one year. Condition is improving with treatment but not currently at goal.   Rosacea is a chronic progressive skin condition usually affecting the face of adults, causing redness and/or acne bumps. It is treatable but not curable. It sometimes affects the eyes (ocular rosacea) as well. It may respond to topical and/or systemic medication and can flare with stress, sun exposure, alcohol, exercise, topical steroids (including hydrocortisone/cortisone 10) and some foods.  Daily application of broad spectrum spf 30+ sunscreen to face is recommended to reduce flares.  Treatment Plan Continue metronidazole 0.75% gel nightly to face. Decrease doxycycline 20 MG take 1 po BID with food dsp #60 5Rf.  Doxycycline should be taken with food to prevent nausea. Do not lay down for 30 minutes after taking. Be cautious with sun exposure and use good sun protection while on this medication. Pregnant women should not take this medication.    Long term medication management.  Patient is using long term (months to years) prescription medication  to control their dermatologic condition.  These medications require periodic monitoring to evaluate for efficacy and side effects.   Return in about 6 months (around 02/04/2024) for TBSE, Hx AKs, Rosacea.  Wendee Beavers, CMA, am acting as scribe for Willeen Niece, MD .   Documentation: I have reviewed the above documentation for accuracy  and completeness, and I agree with the above.  Willeen Niece, MD

## 2023-09-05 ENCOUNTER — Other Ambulatory Visit: Payer: Self-pay | Admitting: Family

## 2023-09-05 DIAGNOSIS — Z1231 Encounter for screening mammogram for malignant neoplasm of breast: Secondary | ICD-10-CM

## 2023-09-09 ENCOUNTER — Ambulatory Visit (INDEPENDENT_AMBULATORY_CARE_PROVIDER_SITE_OTHER): Payer: Medicare Other | Admitting: Nurse Practitioner

## 2023-09-09 ENCOUNTER — Encounter (INDEPENDENT_AMBULATORY_CARE_PROVIDER_SITE_OTHER): Payer: Self-pay | Admitting: Nurse Practitioner

## 2023-09-09 VITALS — BP 134/88 | HR 85 | Resp 18 | Ht 67.5 in | Wt 154.4 lb

## 2023-09-09 DIAGNOSIS — R200 Anesthesia of skin: Secondary | ICD-10-CM | POA: Diagnosis not present

## 2023-09-09 DIAGNOSIS — I83813 Varicose veins of bilateral lower extremities with pain: Secondary | ICD-10-CM | POA: Diagnosis not present

## 2023-09-09 NOTE — Progress Notes (Signed)
Subjective:    Patient ID: Meghan Mack, female    DOB: [birth year], 72 y.o.   MRN: [id number] Chief Complaint  Patient presents with   New Patient (Initial Visit)    np. consult. Varicose veins of lower extremity, unspecified laterality, unspecified whether complicated. arnett, margaret.     Well is a 72 year old female who presents today for evaluation of bilateral varicose veins.  She notes that she has had an episode of bleeding from them before with trauma.  In addition in the right leg she has some pain along the shin area although she does not have significant varicosities in the area.  She does have a history of lower back issues.  She notes that with standing sometimes the varicosities swell and they can burn and staying at times.  Currently there are no open wounds or ulcerations.  The patient has been seen by vascular surgery years ago and since that time she has had medical grade compression socks which she does utilize.    Review of Systems  Cardiovascular:  Positive for leg swelling.  All other systems reviewed and are negative.      Objective:   Physical Exam Vitals reviewed.  HENT:     Head: Normocephalic.  Cardiovascular:     Rate and Rhythm: Normal rate.     Pulses: Normal pulses.  Pulmonary:     Effort: Pulmonary effort is normal.  Musculoskeletal:        General: Tenderness present.  Skin:    General: Skin is warm and dry.  Neurological:     Mental Status: She is alert and oriented to person, place, and time.  Psychiatric:        Mood and Affect: Mood normal.        Behavior: Behavior normal.        Thought Content: Thought content normal.        Judgment: Judgment normal.     BP 134/88 (BP Location: Left Arm)   Pulse 85   Resp 18   Ht 5' 7.5" (1.715 m)   Wt 154 lb 6.4 oz (70 kg)   BMI 23.83 kg/m   Past Medical History:  Diagnosis Date   Actinic keratosis    Anemia    Colonic polyp    Constipation    COPD (chronic obstructive  pulmonary disease) (HCC)    Hyperlipidemia    IBS (irritable bowel syndrome)    Kidney cysts    Lung nodule    Meningioma (HCC)    Nausea    Palpitations    Tonsillar cyst     Social History   Socioeconomic History   Marital status: Widowed    Spouse name: Not on file   Number of children: 2   Years of education: Not on file   Highest education level: Some college, no degree  Occupational History   Occupation: Musician: [employer]  Tobacco Use   Smoking status: Former    Current packs/day: 0.00    Average packs/day: 0.3 packs/day for 10.0 years (2.5 ttl pk-yrs)    Types: Cigarettes    Start date: 09/26/1987    Quit date: 09/25/1997    Years since quitting: 25.9   Smokeless tobacco: Never  Vaping Use   Vaping status: Never Used  Substance and Sexual Activity   Alcohol use: No   Drug use: No   Sexual activity: Yes    Partners: Male    Birth control/protection: Surgical  Other Topics Concern   Not on file  Social History Narrative   Husband passed away 2021/05/28      has 2 children and 2 step-children. No pets.      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       Diet - regular   Exercise - walks 5 days per week   1.5 years of college      Caffeine - 2-3 glasses iced tea day; coffee 1 cup 1/2 decaf and 1/2 reg   Right handed. One level home.    Social Determinants of Health   Financial Resource Strain: Low Risk  (04/26/2023)   Overall Financial Resource Strain (CARDIA)    Difficulty of Paying Living Expenses: Not hard at all  Food Insecurity: No Food Insecurity (04/26/2023)   Hunger Vital Sign    Worried About Running Out of Food in the Last Year: Never true    Ran Out of Food in the Last Year: Never true  Transportation Needs: No Transportation Needs (04/26/2023)   PRAPARE - Administrator, Civil Service (Medical): No    Lack of Transportation (Non-Medical): No  Physical Activity: Insufficiently Active (04/26/2023)    Exercise Vital Sign    Days of Exercise per Week: 1 day    Minutes of Exercise per Session: 20 min  Stress: No Stress Concern Present (04/26/2023)   [employer] of Occupational Health - Occupational Stress Questionnaire    Feeling of Stress : Only a little  Recent Concern: Stress - Stress Concern Present (04/17/2023)   [employer] of Occupational Health - Occupational Stress Questionnaire    Feeling of Stress : To some extent  Social Connections: Moderately Isolated (04/26/2023)   Social Connection and Isolation Panel [NHANES]    Frequency of Communication with Friends and Family: More than three times a week    Frequency of Social Gatherings with Friends and Family: Once a week    Attends Religious Services: 1 to 4 times per year    Active Member of [employer] or Organizations: No    Attends Banker Meetings: Never    Marital Status: Widowed  Intimate Partner Violence: Not At Risk (04/17/2023)   Humiliation, Afraid, Rape, and Kick questionnaire    Fear of Current or Ex-Partner: No    Emotionally Abused: No    Physically Abused: No    Sexually Abused: No    Past Surgical History:  Procedure Laterality Date   CARDIAC CATHETERIZATION  02/25/2007   COLONOSCOPY     ESOPHAGOGASTRODUODENOSCOPY  10/1997 & 08/2005   FOOT SURGERY  04/25/2007   right   THYROIDECTOMY  11/2005   VAGINAL HYSTERECTOMY     ovaries intact    Family History  Problem Relation Age of Onset   Cancer Mother        colon   Emphysema Father    Heart disease Father    Cancer Father        leukemia   Cancer Brother        lung   Cancer Maternal Aunt        melanoma   Cancer Maternal Grandfather        stomach, colon   Breast cancer Neg Hx    Kidney cancer Neg Hx    Kidney disease Neg Hx    Prostate cancer Neg Hx     Allergies  Allergen Reactions   Diflucan [Fluconazole] Hives   Tdap [Tetanus-Diphth-Acell Pertussis] Swelling and Rash  Chlorpheniramine-Pseudoeph Other (See  Comments)   Codeine    Diphth-Acell Pertussis-Tetanus Other (See Comments)   Ezetimibe Other (See Comments) and Nausea Only    Other reaction(s): Not available   Propantheline     Other reaction(s): Not available Other reaction(s): High heart rate   Propantheline Bromide    Propoxyphene     Other reaction(s): Not available   Sulfa Antibiotics Other (See Comments)   Sulfonamide Derivatives    Tetanus Antitoxin Other (See Comments)   Esomeprazole Magnesium Nausea Only   Gadolinium Derivatives Palpitations    gabastat used 05/2019 for MRI/MRA made heart race.  gabastat used 05/2019 for MRI/MRA made heart race.   Meperidine Hcl Other (See Comments) and Palpitations    Causes heart to race   Pneumococcal Vaccine Other (See Comments) and Swelling    Extensive local reaction with arm swelling and induration   Pneumovax 23 [Pneumococcal Vac Polyvalent] Swelling    Extensive local reaction with arm swelling and induration   Propoxyphene N-Acetaminophen Palpitations       Latest Ref Rng & Units 04/30/2023    9:34 AM 08/30/2022    9:02 AM 07/18/2022    9:49 AM  CBC  WBC 4.0 - 10.5 K/uL 4.9  4.4  4.2   Hemoglobin 12.0 - 15.0 g/dL 57.8  46.9  62.9   Hematocrit 36.0 - 46.0 % 39.9  38.3  37.4   Platelets 150.0 - 400.0 K/uL 200.0  181.0  166.0       CMP     Component Value Date/Time   NA 137 04/30/2023 0934   NA 140 08/12/2019 1306   K 4.3 04/30/2023 0934   CL 103 04/30/2023 0934   CO2 27 04/30/2023 0934   GLUCOSE 97 04/30/2023 0934   BUN 12 04/30/2023 0934   BUN 8 08/12/2019 1306   CREATININE 0.78 04/30/2023 0934   CALCIUM 9.5 04/30/2023 0934   PROT 6.7 04/30/2023 0934   PROT 6.2 08/12/2019 1306   ALBUMIN 4.2 04/30/2023 0934   ALBUMIN 4.2 08/12/2019 1306   AST 23 04/30/2023 0934   ALT 21 04/30/2023 0934   ALKPHOS 110 04/30/2023 0934   BILITOT 0.9 04/30/2023 0934   BILITOT 0.2 08/12/2019 1306   GFR 75.48 04/30/2023 0934   GFRNONAA 84 08/12/2019 1306     No results  found.     Assessment & Plan:   1. Varicose veins of both lower extremities with pain The patient has had varicosities for years and has never had any treatment.  She has pain in the shin area of the right lower extremity.  There is also some swelling associated.  She does wear medical grade compression and she is advised to continue to do so.  She should also elevate her lower extremities when possible and be active.  Will have patient return for manage for bilateral venous reflux studies.  2. Left leg numbness Patient has a previous ABIs done which show normal arterial perfusion.  Based on patient description I suspect it is more neurological related   Current Outpatient Medications on File Prior to Visit  Medication Sig Dispense Refill   BYSTOLIC 5 MG tablet TAKE 1 TABLET (5 MG TOTAL) BY MOUTH DAILY. MAY TAKE EXTRA 1 TABLET (5 MG )AS NEEDED FOR TACHYCARDIA 180 tablet 3   ibuprofen (ADVIL,MOTRIN) 600 MG tablet Take 1 tablet (600 mg total) by mouth every 8 (eight) hours as needed. 20 tablet 0   latanoprost (XALATAN) 0.005 % ophthalmic solution SMARTSIG:In Eye(s)  levothyroxine (SYNTHROID, LEVOTHROID) 88 MCG tablet Take 1 tablet 6 days a week.     Multiple Vitamin (MULTIVITAMIN PO) Take 1 tablet by mouth daily.     simvastatin (ZOCOR) 20 MG tablet TAKE 1 TABLET BY MOUTH EVERY DAY IN THE EVENING 90 tablet 3   doxycycline (PERIOSTAT) 20 MG tablet Take 1 tablet (20 mg total) by mouth 2 (two) times daily. Take with food. 60 tablet 5   No current facility-administered medications on file prior to visit.    There are no Patient Instructions on file for this visit. No follow-ups on file.   Georgiana Spinner, NP

## 2023-09-20 ENCOUNTER — Other Ambulatory Visit: Payer: Self-pay | Admitting: Cardiovascular Disease

## 2023-10-08 ENCOUNTER — Other Ambulatory Visit (INDEPENDENT_AMBULATORY_CARE_PROVIDER_SITE_OTHER): Payer: Self-pay | Admitting: Nurse Practitioner

## 2023-10-08 DIAGNOSIS — I83813 Varicose veins of bilateral lower extremities with pain: Secondary | ICD-10-CM

## 2023-10-11 DIAGNOSIS — M778 Other enthesopathies, not elsewhere classified: Secondary | ICD-10-CM | POA: Diagnosis not present

## 2023-10-11 DIAGNOSIS — M216X2 Other acquired deformities of left foot: Secondary | ICD-10-CM | POA: Diagnosis not present

## 2023-10-11 DIAGNOSIS — M216X1 Other acquired deformities of right foot: Secondary | ICD-10-CM | POA: Diagnosis not present

## 2023-10-11 DIAGNOSIS — M25871 Other specified joint disorders, right ankle and foot: Secondary | ICD-10-CM | POA: Diagnosis not present

## 2023-10-11 DIAGNOSIS — L909 Atrophic disorder of skin, unspecified: Secondary | ICD-10-CM | POA: Diagnosis not present

## 2023-10-14 ENCOUNTER — Ambulatory Visit (INDEPENDENT_AMBULATORY_CARE_PROVIDER_SITE_OTHER): Payer: Medicare Other

## 2023-10-14 ENCOUNTER — Ambulatory Visit (INDEPENDENT_AMBULATORY_CARE_PROVIDER_SITE_OTHER): Payer: Medicare Other | Admitting: Vascular Surgery

## 2023-10-14 ENCOUNTER — Encounter (INDEPENDENT_AMBULATORY_CARE_PROVIDER_SITE_OTHER): Payer: Self-pay | Admitting: Vascular Surgery

## 2023-10-14 VITALS — BP 131/89 | HR 90 | Resp 18 | Ht 67.5 in | Wt 151.0 lb

## 2023-10-14 DIAGNOSIS — I2510 Atherosclerotic heart disease of native coronary artery without angina pectoris: Secondary | ICD-10-CM | POA: Diagnosis not present

## 2023-10-14 DIAGNOSIS — M150 Primary generalized (osteo)arthritis: Secondary | ICD-10-CM

## 2023-10-14 DIAGNOSIS — I83813 Varicose veins of bilateral lower extremities with pain: Secondary | ICD-10-CM

## 2023-10-14 DIAGNOSIS — E782 Mixed hyperlipidemia: Secondary | ICD-10-CM | POA: Diagnosis not present

## 2023-10-14 DIAGNOSIS — I872 Venous insufficiency (chronic) (peripheral): Secondary | ICD-10-CM

## 2023-10-17 ENCOUNTER — Telehealth: Payer: Self-pay | Admitting: Cardiovascular Disease

## 2023-10-17 NOTE — Telephone Encounter (Signed)
 Patient called stating she needs prior auth for name brand BYSTOLIC.

## 2023-10-18 ENCOUNTER — Telehealth: Payer: Self-pay | Admitting: Pharmacy Technician

## 2023-10-18 ENCOUNTER — Other Ambulatory Visit (HOSPITAL_COMMUNITY): Payer: Self-pay

## 2023-10-18 NOTE — Telephone Encounter (Signed)
 Pharmacy Patient Advocate Encounter   Received notification from Pt Calls Messages that prior authorization for Bystolic  5mg  is required/requested.   Insurance verification completed.   The patient is insured through [facility] and [employer].   Per test claim: The current 30 day co-pay is, $0.00.  No PA needed at this time. This test claim was processed through [facility]- copay amounts may vary at other pharmacies due to pharmacy/plan contracts, or as the patient moves through the different stages of their insurance plan.     I called and spoke with the patient's pharmacy [facility] and they said it is too soon to fill since the patient just got the medication on 09/03/23 for 90 days. The pharmacy said she patient can get the prescription from them on 11/09/23.

## 2023-10-18 NOTE — Telephone Encounter (Signed)
 Pharmacy Patient Advocate Encounter   Received notification from Pt Calls Messages that prior authorization for Bystolic  5mg  is required/requested.   Insurance verification completed.   The patient is insured through [facility] and [employer] .   Per test claim: The current 30 day co-pay is, $0.00.  No PA needed at this time. This test claim was processed through [facility]- copay amounts may vary at other pharmacies due to pharmacy/plan contracts, or as the patient moves through the different stages of their insurance plan.   I will call her pharmacy when they open up.

## 2023-10-18 NOTE — Telephone Encounter (Signed)
 PA request has been Cancelled. New Encounter created for follow up. For additional info see Pharmacy Prior Auth telephone encounter from 1//3/25.

## 2023-10-20 ENCOUNTER — Encounter (INDEPENDENT_AMBULATORY_CARE_PROVIDER_SITE_OTHER): Payer: Self-pay | Admitting: Vascular Surgery

## 2023-10-20 DIAGNOSIS — I872 Venous insufficiency (chronic) (peripheral): Secondary | ICD-10-CM | POA: Insufficient documentation

## 2023-10-22 ENCOUNTER — Ambulatory Visit
Admission: RE | Admit: 2023-10-22 | Discharge: 2023-10-22 | Disposition: A | Discharge status: Home / Self Care | Payer: Medicare Other | Source: Ambulatory Visit | Attending: Family

## 2023-10-22 DIAGNOSIS — Z1231 Encounter for screening mammogram for malignant neoplasm of breast: Secondary | ICD-10-CM

## 2023-11-07 ENCOUNTER — Telehealth: Payer: Self-pay | Admitting: Cardiovascular Disease

## 2023-11-07 NOTE — Telephone Encounter (Signed)
Pt c/o medication issue:  1. Name of Medication:   BYSTOLIC 5 MG tablet   2. How are you currently taking this medication (dosage and times per day)?   As prescribed  3. Are you having a reaction (difficulty breathing--STAT)?   4. What is your medication issue?   Patient stated her insurance company is requiring she be prescribed this medication 30 days at a time.  Patient noted she will also need a pre-authorization for this name brand medication.

## 2023-11-08 NOTE — Telephone Encounter (Signed)
  Per test claim: The current 30 day co-pay is, $0.00.  No PA needed at this time. This test claim was processed through [facility]- copay amounts may vary at other pharmacies due to pharmacy/plan contracts, or as the patient moves through the different stages of their insurance plan.      I called and spoke with the patient's pharmacy [facility] and they said it is too soon to fill since the patient just got the medication on 09/03/23 for 90 days. The pharmacy said she patient can get the prescription from them on 11/09/23.

## 2023-11-11 ENCOUNTER — Encounter (HOSPITAL_BASED_OUTPATIENT_CLINIC_OR_DEPARTMENT_OTHER): Payer: Self-pay

## 2023-11-11 ENCOUNTER — Telehealth: Payer: Self-pay | Admitting: Pharmacy Technician

## 2023-11-11 ENCOUNTER — Other Ambulatory Visit (HOSPITAL_COMMUNITY): Payer: Self-pay

## 2023-11-11 NOTE — Telephone Encounter (Signed)
Pharmacy Patient Advocate Encounter   Received notification from Patient Advice Request messages that prior authorization for Bystolic BRAND is required/requested.   Insurance verification completed.   The patient is insured through [facility] .   Per test claim: PA required; PA submitted to above mentioned insurance via CoverMyMeds Key/confirmation #/EOC [id number] Status is pending

## 2023-11-12 NOTE — Telephone Encounter (Signed)
Pharmacy Patient Advocate Encounter  Received notification from [facility] that Prior Authorization for Bystolic Charlsie Quest has been APPROVED from 11/11/23 to 10/14/24   PA #/Case ID/Reference #: [id number]

## 2023-11-13 DIAGNOSIS — L308 Other specified dermatitis: Secondary | ICD-10-CM | POA: Diagnosis not present

## 2023-12-12 DIAGNOSIS — D2261 Melanocytic nevi of right upper limb, including shoulder: Secondary | ICD-10-CM | POA: Diagnosis not present

## 2023-12-12 DIAGNOSIS — D225 Melanocytic nevi of trunk: Secondary | ICD-10-CM | POA: Diagnosis not present

## 2023-12-12 DIAGNOSIS — E890 Postprocedural hypothyroidism: Secondary | ICD-10-CM | POA: Diagnosis not present

## 2023-12-12 DIAGNOSIS — L308 Other specified dermatitis: Secondary | ICD-10-CM | POA: Diagnosis not present

## 2023-12-12 DIAGNOSIS — Z85828 Personal history of other malignant neoplasm of skin: Secondary | ICD-10-CM | POA: Diagnosis not present

## 2023-12-12 DIAGNOSIS — E559 Vitamin D deficiency, unspecified: Secondary | ICD-10-CM | POA: Diagnosis not present

## 2023-12-12 DIAGNOSIS — D2262 Melanocytic nevi of left upper limb, including shoulder: Secondary | ICD-10-CM | POA: Diagnosis not present

## 2023-12-12 DIAGNOSIS — D2272 Melanocytic nevi of left lower limb, including hip: Secondary | ICD-10-CM | POA: Diagnosis not present

## 2023-12-12 DIAGNOSIS — L814 Other melanin hyperpigmentation: Secondary | ICD-10-CM | POA: Diagnosis not present

## 2023-12-18 DIAGNOSIS — E890 Postprocedural hypothyroidism: Secondary | ICD-10-CM | POA: Diagnosis not present

## 2023-12-18 DIAGNOSIS — E559 Vitamin D deficiency, unspecified: Secondary | ICD-10-CM | POA: Diagnosis not present

## 2024-01-23 DIAGNOSIS — H40003 Preglaucoma, unspecified, bilateral: Secondary | ICD-10-CM | POA: Diagnosis not present

## 2024-01-30 DIAGNOSIS — H40003 Preglaucoma, unspecified, bilateral: Secondary | ICD-10-CM | POA: Diagnosis not present

## 2024-01-30 DIAGNOSIS — H2513 Age-related nuclear cataract, bilateral: Secondary | ICD-10-CM | POA: Diagnosis not present

## 2024-02-03 ENCOUNTER — Encounter: Payer: Self-pay | Admitting: Family

## 2024-02-03 ENCOUNTER — Ambulatory Visit (INDEPENDENT_AMBULATORY_CARE_PROVIDER_SITE_OTHER): Admitting: Family

## 2024-02-03 VITALS — BP 110/80 | HR 107 | Temp 97.6°F | Ht 67.5 in | Wt 152.8 lb

## 2024-02-03 DIAGNOSIS — I7300 Raynaud's syndrome without gangrene: Secondary | ICD-10-CM | POA: Diagnosis not present

## 2024-02-03 DIAGNOSIS — F4320 Adjustment disorder, unspecified: Secondary | ICD-10-CM | POA: Diagnosis not present

## 2024-02-03 MED ORDER — TRAZODONE HCL 50 MG PO TABS: 25.0000 mg | ORAL_TABLET | Freq: Every day | ORAL | 3 refills | Status: DC

## 2024-02-03 NOTE — Patient Instructions (Addendum)
 Trial of vaseline; hand masks for hands  I will reach out to vascular Trial of trazodone 

## 2024-02-03 NOTE — Progress Notes (Unsigned)
 Assessment & Plan:  There are no diagnoses linked to this encounter.   Return precautions given.   Risks, benefits, and alternatives of the medications and treatment plan prescribed today were discussed, and patient expressed understanding.   Education regarding symptom management and diagnosis given to patient on AVS either electronically or printed.  No follow-ups on file.  [name], FNP  Subjective:    Patient ID: Meghan Mack, female    DOB: 1951-04-06, 73 y.o.   MRN: [id number]  CC: Meghan Mack is a 73 y.o. female who presents today for follow up.   HPI: Complains of  bilateral hand discoloration and staying cold for the past couple of months.  Worse in right hand.  Exacerbated by cold weather   She has itching and peeling on her hands; dermatology prescribed  fluocinonide 0.05 with some relief.  She using hand sanitizer.   She lives alone  She found her sister in law decreased 5 months ago and she has been more worried about being alone.  She has trouble sleeping.           History of Raynaud's phenomenon, Sjogren's syndrome, hypothyroidism, aortic atherosclerosis.  Seen by vascular for chronic venous insufficiency , coronary artery calcification , hyperlipidemia 10/04/2023.  Allergies: Diflucan [fluconazole], Tdap [tetanus-diphth-acell pertussis], Chlorpheniramine-pseudoeph, Codeine, Diphth-acell pertussis-tetanus, Ezetimibe , Propantheline, Propantheline bromide, Propoxyphene, Sulfa antibiotics, Sulfonamide derivatives, Tetanus antitoxin, Esomeprazole magnesium, Gadolinium derivatives, Meperidine hcl, Pneumococcal vaccine, Pneumovax 23 [pneumococcal vac polyvalent], and Propoxyphene n-acetaminophen Current Outpatient Medications on File Prior to Visit  Medication Sig Dispense Refill   BYSTOLIC  5 MG tablet TAKE 1 TABLET (5 MG TOTAL) BY MOUTH DAILY. MAY TAKE EXTRA 1 TABLET (5 MG )AS NEEDED FOR TACHYCARDIA 180 tablet 3   doxycycline  (PERIOSTAT )  20 MG tablet Take 1 tablet (20 mg total) by mouth 2 (two) times daily. Take with food. 60 tablet 5   ibuprofen  (ADVIL ,MOTRIN ) 600 MG tablet Take 1 tablet (600 mg total) by mouth every 8 (eight) hours as needed. 20 tablet 0   latanoprost (XALATAN) 0.005 % ophthalmic solution SMARTSIG:In Eye(s)     levothyroxine (SYNTHROID, LEVOTHROID) 88 MCG tablet Take 1 tablet 6 days a week.     Multiple Vitamin (MULTIVITAMIN PO) Take 1 tablet by mouth daily.     simvastatin  (ZOCOR ) 20 MG tablet TAKE 1 TABLET BY MOUTH EVERY DAY IN THE EVENING 90 tablet 3   No current facility-administered medications on file prior to visit.    Review of Systems    Objective:    BP 122/76   Pulse (!) 107   Temp 97.6 F (36.4 C) (Oral)   Ht 5' 7.5" (1.715 m)   Wt 152 lb 12.8 oz (69.3 kg)   SpO2 98%   BMI 23.58 kg/m  BP Readings from Last 3 Encounters:  02/03/24 122/76  10/14/23 131/89  09/09/23 134/88   Wt Readings from Last 3 Encounters:  02/03/24 152 lb 12.8 oz (69.3 kg)  10/14/23 151 lb (68.5 kg)  09/09/23 154 lb 6.4 oz (70 kg)      02/03/2024   11:59 AM 02/03/2024   11:49 AM 04/17/2023   10:31 AM  Depression screen PHQ 2/9  Decreased Interest 0 0 0  Down, Depressed, Hopeless 0 0 1  PHQ - 2 Score 0 0 1  Altered sleeping 0  1  Tired, decreased energy 0  1  Change in appetite 0  2  Feeling bad or failure about yourself  0  0  Trouble concentrating  0  0  Moving slowly or fidgety/restless 0  0  Suicidal thoughts 0  0  PHQ-9 Score 0  5  Difficult doing work/chores Not difficult at all  Not difficult at all    Physical Exam Vitals reviewed.  Constitutional:      Appearance: She is well-developed.  Eyes:     Conjunctiva/sclera: Conjunctivae normal.  Cardiovascular:     Rate and Rhythm: Normal rate and regular rhythm.     Pulses: Normal pulses.     Heart sounds: Normal heart sounds.  Pulmonary:     Effort: Pulmonary effort is normal.     Breath sounds: Normal breath sounds. No wheezing, rhonchi  or rales.  Skin:    General: Skin is warm and dry.  Neurological:     Mental Status: She is alert.  Psychiatric:        Speech: Speech normal.        Behavior: Behavior normal.        Thought Content: Thought content normal.

## 2024-02-04 ENCOUNTER — Ambulatory Visit (INDEPENDENT_AMBULATORY_CARE_PROVIDER_SITE_OTHER): Payer: Medicare Other | Admitting: Dermatology

## 2024-02-04 DIAGNOSIS — L820 Inflamed seborrheic keratosis: Secondary | ICD-10-CM | POA: Diagnosis not present

## 2024-02-04 DIAGNOSIS — L821 Other seborrheic keratosis: Secondary | ICD-10-CM

## 2024-02-04 DIAGNOSIS — L309 Dermatitis, unspecified: Secondary | ICD-10-CM | POA: Diagnosis not present

## 2024-02-04 DIAGNOSIS — Z792 Long term (current) use of antibiotics: Secondary | ICD-10-CM

## 2024-02-04 DIAGNOSIS — L719 Rosacea, unspecified: Secondary | ICD-10-CM | POA: Diagnosis not present

## 2024-02-04 MED ORDER — TACROLIMUS 0.1 % EX OINT
TOPICAL_OINTMENT | CUTANEOUS | 0 refills | Status: DC
Start: 2024-02-04 — End: 2024-06-30

## 2024-02-04 NOTE — Progress Notes (Signed)
 Follow-Up Visit   Subjective  Meghan Mack is a 73 y.o. female who presents for the following: Rosacea follow up - pt currently using Metronidazole  which does help calm flares. Also takes Doxy prn for flares. Irregular skin lesion on the L cheek that has turned pink and scaly. Dryness and scale of the hands after washing car with cold water after had Raynaud's flare, pt currently using Fluocinonide off and on with some improvement, but not clearing.   The patient has spots, moles and lesions to be evaluated, some may be new or changing and the patient may have concern these could be cancer.   The following portions of the chart were reviewed this encounter and updated as appropriate: medications, allergies, medical history  Review of Systems:  No other skin or systemic complaints except as noted in HPI or Assessment and Plan.  Objective  Well appearing patient in no apparent distress; mood and affect are within normal limits.   A focused examination was performed of the following areas: the face and hands   Relevant exam findings are noted in the Assessment and Plan.  Left Anterior Mandible x 1, right pariatel scalp x 1 (2) Erythematous stuck-on, waxy papule or plaque  Assessment & Plan   SEBORRHEIC KERATOSIS - Stuck-on, waxy, tan-brown papules and/or plaques  - Benign-appearing - Discussed benign etiology and prognosis. - Observe - Call for any changes  ROSACEA Exam mild erythema at nose with telangiectasias at nose and cheeks   Chronic and persistent condition with duration or expected duration over one year. Condition is improving with treatment but not currently at goal.   Rosacea is a chronic progressive skin condition usually affecting the face of adults, causing redness and/or acne bumps. It is treatable but not curable. It sometimes affects the eyes (ocular rosacea) as well. It may respond to topical and/or systemic medication and can flare with stress, sun  exposure, alcohol, exercise, topical steroids (including hydrocortisone/cortisone 10) and some foods.  Daily application of broad spectrum spf 30+ sunscreen to face is recommended to reduce flares.  Patient denies grittiness of the eyes   Treatment Plan Continue doxycycline  20 mg take 1 po bid with food, pt takes prn for flares Continue metronidazole  0.75 % gel qhs to face, bid with flares  Doxycycline  should be taken with food to prevent nausea. Do not lay down for 30 minutes after taking. Be cautious with sun exposure and use good sun protection while on this medication. Pregnant women should not take this medication.   Samples of La Roche Posay daily UV lotion, Eucerin Mineral Spf   Long term medication management. Patient is using long term (months to years) prescription medication to control their dermatologic condition. These medications require periodic monitoring to evaluate for efficacy and side effects    HAND DERMATITIS VS Secondary skin changes due to Raynaud's- pt had recent flare. Exam Pink patches on fingers  Chronic and persistent condition with duration or expected duration over one year. Condition is symptomatic/ bothersome to patient. Not currently at goal. Has been treating it with fluocinonide cream with some improvement, but only uses sporadically due to side effects  Hand Dermatitis is a chronic type of eczema that can come and go on the hands and fingers.  While there is no cure, the rash and symptoms can be managed with topical prescription medications, and for more severe cases, with systemic medications.  Recommend mild soap and routine use of moisturizing cream after handwashing.  Minimize soap/water  exposure when possible.    Treatment Plan Start tacrolimus  0.1% ointment apply to aa twice daily until itchy rash cleared  Recommend mild soap and moisturizing cream with hand washing. Samples of moisturizers given   DERMATITIS   Related Medications tacrolimus   (PROTOPIC ) 0.1 % ointment Apply to affected areas of hands twice daily as needed for dermatitis INFLAMED SEBORRHEIC KERATOSIS (2) Left Anterior Mandible x 1, right pariatel scalp x 1 (2) Symptomatic, irritating, patient would like treated. Destruction of lesion - Left Anterior Mandible x 1, right pariatel scalp x 1 (2)  Destruction method: cryotherapy   Informed consent: discussed and consent obtained   Lesion destroyed using liquid nitrogen: Yes   Region frozen until ice ball extended beyond lesion: Yes   Outcome: patient tolerated procedure well with no complications   Post-procedure details: wound care instructions given   Additional details:  Prior to procedure, discussed risks of blister formation, small wound, skin dyspigmentation, or rare scar following cryotherapy. Recommend Vaseline ointment to treated areas while healing.   Return for tbse 3 - 4 month.  Arlinda Lais, CMA, am acting as scribe for Artemio Larry, MD .   Documentation: I have reviewed the above documentation for accuracy and completeness, and I agree with the above.  Artemio Larry, MD

## 2024-02-04 NOTE — Patient Instructions (Addendum)
 Seborrheic Keratosis  What causes seborrheic keratoses? Seborrheic keratoses are harmless, common skin growths that first appear during adult life.  As time goes by, more growths appear.  Some people may develop a large number of them.  Seborrheic keratoses appear on both covered and uncovered body parts.  They are not caused by sunlight.  The tendency to develop seborrheic keratoses can be inherited.  They vary in color from skin-colored to gray, brown, or even black.  They can be either smooth or have a rough, warty surface.   Seborrheic keratoses are superficial and look as if they were stuck on the skin.  Under the microscope this type of keratosis looks like layers upon layers of skin.  That is why at times the top layer may seem to fall off, but the rest of the growth remains and re-grows.    Treatment Seborrheic keratoses do not need to be treated, but can easily be removed in the office.  Seborrheic keratoses often cause symptoms when they rub on clothing or jewelry.  Lesions can be in the way of shaving.  If they become inflamed, they can cause itching, soreness, or burning.  Removal of a seborrheic keratosis can be accomplished by freezing, burning, or surgery. If any spot bleeds, scabs, or grows rapidly, please return to have it checked, as these can be an indication of a skin cancer.   Cryotherapy Aftercare  Wash gently with soap and water everyday.   Apply Vaseline and Band-Aid daily until healed.      Gentle Skin Care Guide  1. Bathe no more than once a day.  2. Avoid bathing in hot water  3. Use a mild soap like Dove, Vanicream, Cetaphil, CeraVe. Can use Lever 2000 or Cetaphil antibacterial soap  4. Use soap only where you need it. On most days, use it under your arms, between your legs, and on your feet. Let the water rinse other areas unless visibly dirty.  5. When you get out of the bath/shower, use a towel to gently blot your skin dry, don't rub it.  6. While your  skin is still a little damp, apply a moisturizing cream such as Vanicream, CeraVe, Cetaphil, Eucerin, Sarna lotion or plain Vaseline Jelly. For hands apply Neutrogena [place] Hand Cream or Excipial Hand Cream.  7. Reapply moisturizer any time you start to itch or feel dry.  8. Sometimes using free and clear laundry detergents can be helpful. Fabric softener sheets should be avoided. Downy Free & Gentle liquid, or any liquid fabric softener that is free of dyes and perfumes, it acceptable to use  9. If your doctor has given you prescription creams you may apply moisturizers over them     Due to recent changes in healthcare laws, you may see results of your pathology and/or laboratory studies on MyChart before the doctors have had a chance to review them. We understand that in some cases there may be results that are confusing or concerning to you. Please understand that not all results are received at the same time and often the doctors may need to interpret multiple results in order to provide you with the best plan of care or course of treatment. Therefore, we ask that you please give us  2 business days to thoroughly review all your results before contacting the office for clarification. Should we see a critical lab result, you will be contacted sooner.   If You Need Anything After Your Visit  If you have any questions or  concerns for your doctor, please call our main line at [phone] and press option 4 to reach your doctor's medical assistant. If no one answers, please leave a voicemail as directed and we will return your call as soon as possible. Messages left after 4 pm will be answered the following business day.   You may also send us  a message via MyChart. We typically respond to MyChart messages within 1-2 business days.  For prescription refills, please ask your pharmacy to contact our office. Our fax number is [phone].  If you have an urgent issue when the clinic is closed  that cannot wait until the next business day, you can page your doctor at the number below.    Please note that while we do our best to be available for urgent issues outside of office hours, we are not available 24/7.   If you have an urgent issue and are unable to reach us , you may choose to seek medical care at your doctor's office, retail clinic, urgent care center, or emergency room.  If you have a medical emergency, please immediately call 911 or go to the emergency department.  Pager Numbers  - Dr. Bary Likes: [phone]  - Dr. Annette Barters: [phone]  - Dr. Felipe Horton: [phone]   In the event of inclement weather, please call our main line at [phone] for an update on the status of any delays or closures.  Dermatology Medication Tips: Please keep the boxes that topical medications come in in order to help keep track of the instructions about where and how to use these. Pharmacies typically print the medication instructions only on the boxes and not directly on the medication tubes.   If your medication is too expensive, please contact our office at [phone] option 4 or send us  a message through MyChart.   We are unable to tell what your co-pay for medications will be in advance as this is different depending on your insurance coverage. However, we may be able to find a substitute medication at lower cost or fill out paperwork to get insurance to cover a needed medication.   If a prior authorization is required to get your medication covered by your insurance company, please allow us  1-2 business days to complete this process.  Drug prices often vary depending on where the prescription is filled and some pharmacies may offer cheaper prices.  The website www.goodrx.com contains coupons for medications through different pharmacies. The prices here do not account for what the cost may be with help from insurance (it may be cheaper with your insurance), but the website can give  you the price if you did not use any insurance.  - You can print the associated coupon and take it with your prescription to the pharmacy.  - You may also stop by our office during regular business hours and pick up a GoodRx coupon card.  - If you need your prescription sent electronically to a different pharmacy, notify our office through [facility] or by phone at [phone] option 4.     Si Usted Necesita Algo Despus de Su Visita  Tambin puede enviarnos un mensaje a travs de Clinical cytogeneticist. Por lo general respondemos a los mensajes de MyChart en el transcurso de 1 a 2 das hbiles.  Para renovar recetas, por favor pida a su farmacia que se ponga en contacto con nuestra oficina. Franz Jacks de fax es [address] [phone].  Si tiene un asunto urgente cuando la clnica est cerrada y que no puede  esperar hasta el siguiente da hbil, puede llamar/localizar a su doctor(a) al nmero que aparece a continuacin.   Por favor, tenga en cuenta que aunque hacemos todo lo posible para estar disponibles para asuntos urgentes fuera del horario de [place], no estamos disponibles las 24 horas del da, los 7 [address]  [address] de la [place].   Si tiene un problema urgente y no puede comunicarse con nosotros, puede optar por buscar atencin mdica  en el consultorio de su doctor(a), en una clnica privada, en un centro de atencin urgente o en una sala de emergencias.  Si tiene Engineer, drilling, por favor llame inmediatamente al 911 o vaya a la sala de emergencias.  Nmeros de bper  - Dr. Bary Likes: [phone]  - Dra. Annette Barters: [phone]  - Dr. Felipe Horton: [phone]   En caso de inclemencias del tiempo, por favor llame a Lajuan Pila principal al [phone] para una actualizacin sobre el [place] de cualquier retraso o cierre.  Consejos para la medicacin en dermatologa: Por favor, guarde las cajas en las que vienen los medicamentos de uso tpico para ayudarle a seguir las instrucciones sobre dnde  y cmo usarlos. Las farmacias generalmente imprimen las instrucciones del medicamento slo en las cajas y no directamente en los tubos del [place].   Si su medicamento es muy caro, por favor, pngase en contacto con Bettyjane Brunet llamando al [phone] y presione la opcin 4 o envenos un mensaje a travs de Clinical cytogeneticist.   No podemos decirle cul ser su copago por los medicamentos por adelantado ya que esto es diferente dependiendo de la cobertura de su seguro. Sin embargo, es posible que podamos encontrar un medicamento sustituto a Audiological scientist un formulario para que el seguro cubra el medicamento que se considera necesario.   Si se requiere una autorizacin previa para que su compaa de seguros [place] su medicamento, por favor permtanos de 1 a 2 das hbiles para completar este proceso.  Los precios de los medicamentos varan con frecuencia dependiendo del Environmental consultant de dnde se surte la receta y alguna farmacias pueden ofrecer precios ms baratos.  El sitio web www.goodrx.com tiene cupones para medicamentos de Health and safety inspector. Los precios aqu no tienen en cuenta lo que podra costar con la ayuda del seguro (puede ser ms barato con su seguro), pero el sitio web puede darle el precio si no utiliz Tourist information centre manager.  - Puede imprimir el cupn correspondiente y llevarlo con su receta a la farmacia.  - Tambin puede pasar por nuestra oficina durante el horario de atencin regular y Education officer, museum una tarjeta de cupones de GoodRx.  - Si necesita que su receta se enve electrnicamente a una farmacia diferente, informe a nuestra oficina a travs de MyChart de [place] o por telfono llamando al [phone] y presione la opcin 4.

## 2024-02-04 NOTE — Progress Notes (Deleted)
   Follow-Up Visit   Subjective  Meghan Mack is a 73 y.o. female who presents for the following: Skin Cancer Screening and Full Body Skin Exam  The patient presents for Total-Body Skin Exam (TBSE) for skin cancer screening and mole check. The patient has spots, moles and lesions to be evaluated, some may be new or changing and the patient may have concern these could be cancer.    The following portions of the chart were reviewed this encounter and updated as appropriate: medications, allergies, medical history  Review of Systems:  No other skin or systemic complaints except as noted in HPI or Assessment and Plan.  Objective  Well appearing patient in no apparent distress; mood and affect are within normal limits.  A full examination was performed including scalp, head, eyes, ears, nose, lips, neck, chest, axillae, abdomen, back, buttocks, bilateral upper extremities, bilateral lower extremities, hands, feet, fingers, toes, fingernails, and toenails. All findings within normal limits unless otherwise noted below.   Relevant physical exam findings are noted in the Assessment and Plan.    Assessment & Plan   SKIN CANCER SCREENING PERFORMED TODAY.  ACTINIC DAMAGE - Chronic condition, secondary to cumulative UV/sun exposure - diffuse scaly erythematous macules with underlying dyspigmentation - Recommend daily broad spectrum sunscreen SPF 30+ to sun-exposed areas, reapply every 2 hours as needed.  - Staying in the shade or wearing long sleeves, sun glasses (UVA+UVB protection) and wide brim hats (4-inch brim around the entire circumference of the hat) are also recommended for sun protection.  - Call for new or changing lesions.  LENTIGINES, SEBORRHEIC KERATOSES, HEMANGIOMAS - Benign normal skin lesions - Benign-appearing - Call for any changes  MELANOCYTIC NEVI - Tan-brown and/or pink-flesh-colored symmetric macules and papules - Benign appearing on exam today -  Observation - Call clinic for new or changing moles - Recommend daily use of broad spectrum spf 30+ sunscreen to sun-exposed areas.   ROSACEA   Exam:  Mild erythema of the nose, with few small pink papules.   Chronic and persistent condition with duration or expected duration over one year. Condition is improving with treatment but not currently at goal.     Rosacea is a chronic progressive skin condition usually affecting the face of adults, causing redness and/or acne bumps. It is treatable but not curable. It sometimes affects the eyes (ocular rosacea) as well. It may respond to topical and/or systemic medication and can flare with stress, sun exposure, alcohol, exercise, topical steroids (including hydrocortisone/cortisone 10) and some foods.  Daily application of broad spectrum spf 30+ sunscreen to face is recommended to reduce flares.   Treatment Plan Continue metronidazole  0.75% gel nightly to face. Decrease doxycycline  20 MG take 1 po BID with food dsp #60 5Rf.   Doxycycline  should be taken with food to prevent nausea. Do not lay down for 30 minutes after taking. Be cautious with sun exposure and use good sun protection while on this medication. Pregnant women should not take this medication.      Long term medication management.  Patient is using long term (months to years) prescription medication  to control their dermatologic condition.  These medications require periodic monitoring to evaluate for efficacy and side effects.    No follow-ups on file.  ***  Documentation: I have reviewed the above documentation for accuracy and completeness, and I agree with the above.  Artemio Larry, MD

## 2024-02-06 ENCOUNTER — Encounter: Payer: Self-pay | Admitting: Family

## 2024-02-06 NOTE — Assessment & Plan Note (Signed)
 Trouble sleeping, increased anxiety after death of sister-in-law.  Start trazodone .  Close follow-up

## 2024-02-06 NOTE — Assessment & Plan Note (Addendum)
 Presentation consistent with Raynaud's phenomenon wo grangene as aggravated by cold temperature, emotional stress.  History of sjogrens which is often a/w Raynaud's phenomenon.  Reviewed prior rheumatology note from 2021.  Patient lost to follow-up.Recommended follow up with rheumatology Discussed goal preventative measures, avoidance of vasoconstrictive drugs.

## 2024-03-03 ENCOUNTER — Encounter (INDEPENDENT_AMBULATORY_CARE_PROVIDER_SITE_OTHER): Payer: Self-pay

## 2024-03-16 ENCOUNTER — Ambulatory Visit: Admitting: Family

## 2024-04-08 DIAGNOSIS — M778 Other enthesopathies, not elsewhere classified: Secondary | ICD-10-CM | POA: Diagnosis not present

## 2024-04-08 DIAGNOSIS — L909 Atrophic disorder of skin, unspecified: Secondary | ICD-10-CM | POA: Diagnosis not present

## 2024-04-08 DIAGNOSIS — M216X2 Other acquired deformities of left foot: Secondary | ICD-10-CM | POA: Diagnosis not present

## 2024-04-08 DIAGNOSIS — M25871 Other specified joint disorders, right ankle and foot: Secondary | ICD-10-CM | POA: Diagnosis not present

## 2024-04-08 DIAGNOSIS — M216X1 Other acquired deformities of right foot: Secondary | ICD-10-CM | POA: Diagnosis not present

## 2024-04-14 DIAGNOSIS — R1013 Epigastric pain: Secondary | ICD-10-CM | POA: Diagnosis not present

## 2024-04-14 DIAGNOSIS — K5909 Other constipation: Secondary | ICD-10-CM | POA: Diagnosis not present

## 2024-04-14 DIAGNOSIS — R1033 Periumbilical pain: Secondary | ICD-10-CM | POA: Diagnosis not present

## 2024-04-14 DIAGNOSIS — R110 Nausea: Secondary | ICD-10-CM | POA: Diagnosis not present

## 2024-04-14 DIAGNOSIS — Z860101 Personal history of adenomatous and serrated colon polyps: Secondary | ICD-10-CM | POA: Diagnosis not present

## 2024-04-14 DIAGNOSIS — K449 Diaphragmatic hernia without obstruction or gangrene: Secondary | ICD-10-CM | POA: Diagnosis not present

## 2024-04-21 ENCOUNTER — Ambulatory Visit (INDEPENDENT_AMBULATORY_CARE_PROVIDER_SITE_OTHER): Payer: Medicare Other | Admitting: *Deleted

## 2024-04-21 VITALS — Ht 67.5 in | Wt 148.0 lb

## 2024-04-21 DIAGNOSIS — Z0000 Encounter for general adult medical examination without abnormal findings: Secondary | ICD-10-CM

## 2024-04-21 NOTE — Progress Notes (Signed)
 Subjective:   Meghan Mack is a 73 y.o. who presents for a Medicare Wellness preventive visit.  As a reminder, Annual Wellness Visits don't include a physical exam, and some assessments may be limited, especially if this visit is performed virtually. We may recommend an in-person follow-up visit with your provider if needed.  Visit Complete: Virtual I connected with  Meghan Mack on 04/21/24 by a audio enabled telemedicine application and verified that I am speaking with the correct person using two identifiers.  Patient Location: Home  Provider Location: Home Office  I discussed the limitations of evaluation and management by telemedicine. The patient expressed understanding and agreed to proceed.  Vital Signs: Because this visit was a virtual/telehealth visit, some criteria may be missing or patient reported. Any vitals not documented were not able to be obtained and vitals that have been documented are patient reported.  VideoDeclined- This patient declined Librarian, academic. Therefore the visit was completed with audio only.  Persons Participating in Visit: Patient.  AWV Questionnaire: Yes: Patient Medicare AWV questionnaire was completed by the patient on 04/14/24; I have confirmed that all information answered by patient is correct and no changes since this date.  Cardiac Risk Factors include: advanced age (>70men, >62 women);dyslipidemia     Objective:    Today's Vitals   04/21/24 0955  Weight: 148 lb (67.1 kg)  Height: 5' 7.5 (1.715 m)   Body mass index is 22.84 kg/m.     04/21/2024   10:06 AM 04/17/2023   10:37 AM 03/23/2022   11:25 AM 07/10/2021    8:58 AM 03/08/2021    9:52 AM 01/23/2021    9:36 AM 03/07/2020    9:42 AM  Advanced Directives  Does Patient Have a Medical Advance Directive? No No No Yes Yes Yes Yes  Type of Theme park manager;Living will Healthcare Power of [place];Living will   Healthcare Power of [place];Living will  Does patient want to make changes to medical advance directive?     No - Patient declined  No - Patient declined  Copy of Healthcare Power of Attorney in Chart?     No - copy requested  No - copy requested  Would patient like information on creating a medical advance directive? No - Patient declined  No - Patient declined        Current Medications (verified) Outpatient Encounter Medications as of 04/21/2024  Medication Sig   BYSTOLIC  5 MG tablet TAKE 1 TABLET (5 MG TOTAL) BY MOUTH DAILY. MAY TAKE EXTRA 1 TABLET (5 MG )AS NEEDED FOR TACHYCARDIA   doxycycline  (PERIOSTAT ) 20 MG tablet Take 1 tablet (20 mg total) by mouth 2 (two) times daily. Take with food. (Patient taking differently: Take 20 mg by mouth 2 (two) times daily as needed. Take with food.)   ibuprofen  (ADVIL ,MOTRIN ) 600 MG tablet Take 1 tablet (600 mg total) by mouth every 8 (eight) hours as needed.   latanoprost (XALATAN) 0.005 % ophthalmic solution SMARTSIG:In Eye(s)   levothyroxine (SYNTHROID, LEVOTHROID) 88 MCG tablet Take 1 tablet 6 days a week.   Multiple Vitamin (MULTIVITAMIN PO) Take 1 tablet by mouth daily.   simvastatin  (ZOCOR ) 20 MG tablet TAKE 1 TABLET BY MOUTH EVERY DAY IN THE EVENING   traZODone  (DESYREL ) 50 MG tablet Take 0.5-1 tablets (25-50 mg total) by mouth at bedtime.   tacrolimus  (PROTOPIC ) 0.1 % ointment Apply to affected areas of hands twice daily as needed for dermatitis (  Patient not taking: Reported on 04/21/2024)   No facility-administered encounter medications on file as of 04/21/2024.    Allergies (verified) Diflucan [fluconazole], Tdap [tetanus-diphth-acell pertussis], Chlorpheniramine-pseudoeph, Codeine, Diphth-acell pertussis-tetanus, Ezetimibe , Propantheline, Propantheline bromide, Propoxyphene, Sulfa antibiotics, Sulfonamide derivatives, Tetanus antitoxin, Esomeprazole magnesium, Gadolinium derivatives, Meperidine hcl, Pneumococcal vaccine, Pneumovax 23  [pneumococcal vac polyvalent], and Propoxyphene n-acetaminophen   History: Past Medical History:  Diagnosis Date   Actinic keratosis    Anemia    Colonic polyp    Constipation    COPD (chronic obstructive pulmonary disease) (HCC)    Hyperlipidemia    IBS (irritable bowel syndrome)    Kidney cysts    Lung nodule    Meningioma (HCC)    Nausea    Palpitations    Tonsillar cyst    Past Surgical History:  Procedure Laterality Date   CARDIAC CATHETERIZATION  02/25/2007   COLONOSCOPY     ESOPHAGOGASTRODUODENOSCOPY  10/1997 & 08/2005   FOOT SURGERY  04/25/2007   right   THYROIDECTOMY  11/2005   VAGINAL HYSTERECTOMY     ovaries intact   Family History  Problem Relation Age of Onset   Cancer Mother        colon   Emphysema Father    Heart disease Father    Cancer Father        leukemia   Cancer Brother        lung   Cancer Maternal Aunt        melanoma   Cancer Maternal Grandfather        stomach, colon   Breast cancer Neg Hx    Kidney cancer Neg Hx    Kidney disease Neg Hx    Prostate cancer Neg Hx    Social History   Socioeconomic History   Marital status: Widowed    Spouse name: Not on file   Number of children: 2   Years of education: Not on file   Highest education level: Some college, no degree  Occupational History   Occupation: Musician: [employer]  Tobacco Use   Smoking status: Former    Current packs/day: 0.00    Average packs/day: 0.3 packs/day for 10.0 years (2.5 ttl pk-yrs)    Types: Cigarettes    Start date: 09/26/1987    Quit date: 09/25/1997    Years since quitting: 26.5   Smokeless tobacco: Never  Vaping Use   Vaping status: Never Used  Substance and Sexual Activity   Alcohol use: No   Drug use: No   Sexual activity: Yes    Partners: Male    Birth control/protection: Surgical  Other Topics Concern   Not on file  Social History Narrative   Husband passed away 06-06-2021      has 2 children and 2 step-children. No  pets.      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       Diet - regular   Exercise - walks 5 days per week   1.5 years of college      Caffeine - 2-3 glasses iced tea day; coffee 1 cup 1/2 decaf and 1/2 reg   Right handed. One level home.    Social Drivers of Corporate investment banker Strain: Low Risk  (04/14/2024)   Received from [facility]   Overall Financial Resource Strain (CARDIA)    Difficulty of Paying Living Expenses: Not hard at all  Food Insecurity: No Food Insecurity (  04/14/2024)   Hunger Vital Sign    Worried About Running Out of Food in the Last Year: Never true    Ran Out of Food in the Last Year: Never true  Transportation Needs: No Transportation Needs (04/14/2024)   Received from [facility] - Transportation    In the past 12 months, has lack of transportation kept you from medical appointments or from getting medications?: No    Lack of Transportation (Non-Medical): No  Physical Activity: Insufficiently Active (04/14/2024)   Exercise Vital Sign    Days of Exercise per Week: 1 day    Minutes of Exercise per Session: 20 min  Stress: No Stress Concern Present (04/14/2024)   [employer] of Occupational Health - Occupational Stress Questionnaire    Feeling of Stress: Only a little  Recent Concern: Stress - Stress Concern Present (01/30/2024)   [employer] of Occupational Health - Occupational Stress Questionnaire    Feeling of Stress : To some extent  Social Connections: Moderately Isolated (04/14/2024)   Social Connection and Isolation Panel    Frequency of Communication with Friends and Family: More than three times a week    Frequency of Social Gatherings with Friends and Family: Once a week    Attends Religious Services: Never    Database administrator or Organizations: Yes    Attends Banker Meetings: 1 to 4 times per year    Marital Status: Widowed    Tobacco  Counseling Counseling given: Not Answered    Clinical Intake:  Pre-visit preparation completed: Yes  Pain : No/denies pain     BMI - recorded: 22.84 Nutritional Risks: None Diabetes: No  Lab Results  Component Value Date   HGBA1C 5.6 07/18/2022   HGBA1C 5.6 07/18/2021   HGBA1C 5.7 07/07/2020     How often do you need to have someone help you when you read instructions, pamphlets, or other written materials from your doctor or pharmacy?: 1 - Never  Interpreter Needed?: No  Information entered by :: R. Danesha Kirchoff LPN   Activities of Daily Living     04/14/2024    6:56 PM  In your present state of health, do you have any difficulty performing the following activities:  Hearing? 1  Comment wears aids at times  Vision? 0  Comment glasses  Difficulty concentrating or making decisions? 0  Walking or climbing stairs? 0  Dressing or bathing? 0  Doing errands, shopping? 0  Preparing Food and eating ? N  Using the Toilet? N  In the past six months, have you accidently leaked urine? Y  Do you have problems with loss of bowel control? N  Managing your Medications? N  Managing your Finances? N  Housekeeping or managing your Housekeeping? N    Patient Care Team: Dineen Rollene MATSU, FNP as PCP - General (Family Medicine) Perla Evalene PARAS, MD as Consulting Physician (Cardiology) Skeet Juliene SAUNDERS, DO as Consulting Physician (Neurology)  I have updated your Care Teams any recent Medical Services you may have received from other providers in the past year.     Assessment:   This is a routine wellness examination for Cheryln.  Hearing/Vision screen Hearing Screening - Comments:: Wears aids at times Vision Screening - Comments:: glasses   Goals Addressed             This Visit's Progress    Patient Stated       Wants to walk more  Depression Screen     04/21/2024   10:01 AM 02/03/2024   11:59 AM 02/03/2024   11:49 AM 04/17/2023   10:31 AM 10/19/2022    8:43 AM  07/18/2022    9:01 AM 03/23/2022   11:42 AM  PHQ 2/9 Scores  PHQ - 2 Score 0 0 0 1 0 0 0  PHQ- 9 Score 1 0  5 2      Fall Risk     04/14/2024    6:56 PM 02/03/2024   11:48 AM 04/17/2023   10:42 AM 04/16/2023    2:29 PM 10/19/2022    8:42 AM  Fall Risk   Falls in the past year? 1 0 1 1 0  Number falls in past yr: 1 0 1 0 0  Injury with Fall? 0 0 0 0 0  Risk for fall due to : History of fall(s);Impaired balance/gait No Fall Risks No Fall Risks;Impaired balance/gait  No Fall Risks  Risk for fall due to: Comment   tripped over her own feet,    Follow up Falls evaluation completed;Falls prevention discussed Falls evaluation completed Falls prevention discussed;Falls evaluation completed;Education provided  Falls evaluation completed      Data saved with a previous flowsheet row definition    MEDICARE RISK AT HOME:  Medicare Risk at Home Any stairs in or around the home?: (Patient-Rptd) No If so, are there any without handrails?: (Patient-Rptd) No Home free of loose throw rugs in walkways, pet beds, electrical cords, etc?: (Patient-Rptd) Yes Adequate lighting in your home to reduce risk of falls?: (Patient-Rptd) Yes Life alert?: (Patient-Rptd) No Use of a cane, walker or w/c?: (Patient-Rptd) No Grab bars in the bathroom?: (Patient-Rptd) Yes Shower chair or bench in shower?: (Patient-Rptd) No Elevated toilet seat or a handicapped toilet?: (Patient-Rptd) No  TIMED UP AND GO:  Was the test performed?  No  Cognitive Function: 6CIT completed    01/01/2018    8:38 AM 08/03/2015   12:03 PM  MMSE - Mini Mental State Exam  Not completed: Refused   Orientation to time  5   Orientation to Place  5   Registration  3   Attention/ Calculation  4   Recall  2   Language- name 2 objects  2   Language- repeat  1  Language- follow 3 step command  3   Language- read & follow direction  1   Write a sentence  1   Copy design  1   Total score  28      Data saved with a previous flowsheet row  definition      01/19/2016   11:00 AM  Montreal Cognitive Assessment   Visuospatial/ Executive (0/5) 3  Naming (0/3) 3  Attention: Read list of digits (0/2) 2  Attention: Read list of letters (0/1) 1  Attention: Serial 7 subtraction starting at 100 (0/3) 0  Language: Repeat phrase (0/2) 2  Language : Fluency (0/1) 1  Abstraction (0/2) 2  Delayed Recall (0/5) 3  Orientation (0/6) 6  Total 23  Adjusted Score (based on education) 23      04/21/2024   10:07 AM 04/17/2023   10:40 AM 03/07/2020   10:07 AM 03/05/2019    9:55 AM  6CIT Screen  What Year? 0 points 0 points 0 points 0 points  What month? 0 points 0 points 0 points 0 points  What time? 0 points 0 points 0 points 0 points  Count back from 20 0 points 0  points 0 points 0 points  Months in reverse 0 points 0 points 0 points 0 points  Repeat phrase 0 points 0 points 2 points 0 points  Total Score 0 points 0 points 2 points 0 points    Immunizations Immunization History  Administered Date(s) Administered   Fluad Quad(high Dose 65+) 07/14/2021, 07/18/2022   Fluad Trivalent(High Dose 65+) 07/16/2023   Influenza Split 07/15/2011, 06/26/2014   Influenza Whole 06/22/2009, 06/13/2010, 07/05/2012, 07/06/2018   Influenza, High Dose Seasonal PF 07/06/2018, 07/16/2019   Influenza,inj,quad, With Preservative 07/16/2019   Influenza-Unspecified 06/15/2013, 06/19/2015, 06/29/2016, 07/14/2017, 07/16/2019, 07/11/2020   Moderna Covid-19 Vaccine Bivalent Booster 109yrs & up 10/27/2021   PFIZER(Purple Top)SARS-COV-2 Vaccination 11/23/2019, 12/18/2019, 08/01/2020   Pneumococcal Conjugate-13 06/27/2015   Pneumococcal Polysaccharide-23 08/11/2007   Td 07/17/2001   Tdap 09/03/2011   Zoster, Live 06/13/2010    Screening Tests Health Maintenance  Topic Date Due   DTaP/Tdap/Td (3 - Td or Tdap) 09/02/2021   COVID-19 Vaccine (5 - 2024-25 season) 06/16/2023   Medicare Annual Wellness (AWV)  04/16/2024   Colonoscopy  07/20/2024   Zoster  Vaccines- Shingrix (1 of 2) 05/04/2024 (Originally 02/24/1970)   Pneumococcal Vaccine: 50+ Years (3 of 3 - PCV20 or PCV21) 02/02/2025 (Originally 08/22/2015)   INFLUENZA VACCINE  05/15/2024   MAMMOGRAM  10/21/2025   DEXA SCAN  Completed   Hepatitis C Screening  Completed   Hepatitis B Vaccines  Aged Out   HPV VACCINES  Aged Out   Meningococcal B Vaccine  Aged Out    Health Maintenance  Health Maintenance Due  Topic Date Due   DTaP/Tdap/Td (3 - Td or Tdap) 09/02/2021   COVID-19 Vaccine (5 - 2024-25 season) 06/16/2023   Medicare Annual Wellness (AWV)  04/16/2024   Colonoscopy  07/20/2024   Health Maintenance Items Addressed: Discussed the need to update tetanus, shingles and covid vaccines  Additional Screening:  Vision Screening: Recommended annual ophthalmology exams for early detection of glaucoma and other disorders of the eye. Up to date [facility] Would you like a referral to an eye doctor? No    Dental Screening: Recommended annual dental exams for proper oral hygiene  Community Resource Referral / Chronic Care Management: CRR required this visit?  No   CCM required this visit?  No   Plan:    I have personally reviewed and noted the following in the patient's chart:   Medical and social history Use of alcohol, tobacco or illicit drugs  Current medications and supplements including opioid prescriptions. Patient is not currently taking opioid prescriptions. Functional ability and status Nutritional status Physical activity Advanced directives List of other physicians Hospitalizations, surgeries, and ER visits in previous 12 months Vitals Screenings to include cognitive, depression, and falls Referrals and appointments  In addition, I have reviewed and discussed with patient certain preventive protocols, quality metrics, and best practice recommendations. A written personalized care plan for preventive services as well as general preventive health  recommendations were provided to patient.   Angeline Fredericks, LPN   11/15/7972   After Visit Summary: (MyChart) Due to this being a telephonic visit, the after visit summary with patients personalized plan was offered to patient via MyChart   Notes: Nothing significant to report at this time.

## 2024-04-21 NOTE — Patient Instructions (Signed)
 Meghan Mack , Thank you for taking time out of your busy schedule to complete your Annual Wellness Visit with me. I enjoyed our conversation and look forward to speaking with you again next year. I, as well as your care team,  appreciate your ongoing commitment to your health goals. Please review the following plan we discussed and let me know if I can assist you in the future. Your Game plan/ To Do List    Referrals: If you haven't heard from the office you've been referred to, please reach out to them at the phone provided.  Remember to update your tetanus, shingles and covid vaccines Follow up Visits: Next Medicare AWV with our clinical staff: 04/26/25 @ 9:30   Have you seen your provider in the last 6 months (3 months if uncontrolled diabetes)? Yes Next Office Visit with your provider: 05/14/24  Clinician Recommendations:  Aim for 30 minutes of exercise or brisk walking, 6-8 glasses of water, and 5 servings of fruits and vegetables each day.       This is a list of the screening recommended for you and due dates:  Health Maintenance  Topic Date Due   DTaP/Tdap/Td vaccine (3 - Td or Tdap) 09/02/2021   COVID-19 Vaccine (5 - 2024-25 season) 06/16/2023   Colon Cancer Screening  07/20/2024   Zoster (Shingles) Vaccine (1 of 2) 05/04/2024*   Pneumococcal Vaccine for age over 53 (3 of 3 - PCV20 or PCV21) 02/02/2025*   Flu Shot  05/15/2024   Medicare Annual Wellness Visit  04/21/2025   Mammogram  10/21/2025   DEXA scan (bone density measurement)  Completed   Hepatitis C Screening  Completed   Hepatitis B Vaccine  Aged Out   HPV Vaccine  Aged Out   Meningitis B Vaccine  Aged Out  *Topic was postponed. The date shown is not the original due date.    Advanced directives: (ACP Link)Information on Advanced Care Planning can be found at [place]  Secretary of [facility] Advance Health Care Directives Advance Health Care Directives. [email or web address]  Advance Care Planning is important because  it:  [x]  Makes sure you receive the medical care that is consistent with your values, goals, and preferences  [x]  It provides guidance to your family and loved ones and reduces their decisional burden about whether or not they are making the right decisions based on your wishes.  Follow the link provided in your after visit summary or read over the paperwork we have mailed to you to help you started getting your Advance Directives in place. If you need assistance in completing these, please reach out to us  so that we can help you!

## 2024-05-14 ENCOUNTER — Ambulatory Visit (INDEPENDENT_AMBULATORY_CARE_PROVIDER_SITE_OTHER): Admitting: Family

## 2024-05-14 ENCOUNTER — Encounter: Payer: Self-pay | Admitting: Family

## 2024-05-14 VITALS — BP 136/78 | HR 78 | Temp 97.8°F | Ht 67.5 in | Wt 145.0 lb

## 2024-05-14 DIAGNOSIS — K219 Gastro-esophageal reflux disease without esophagitis: Secondary | ICD-10-CM | POA: Diagnosis not present

## 2024-05-14 DIAGNOSIS — I471 Supraventricular tachycardia, unspecified: Secondary | ICD-10-CM | POA: Diagnosis not present

## 2024-05-14 DIAGNOSIS — B379 Candidiasis, unspecified: Secondary | ICD-10-CM | POA: Diagnosis not present

## 2024-05-14 DIAGNOSIS — D329 Benign neoplasm of meninges, unspecified: Secondary | ICD-10-CM | POA: Diagnosis not present

## 2024-05-14 DIAGNOSIS — I700 Atherosclerosis of aorta: Secondary | ICD-10-CM

## 2024-05-14 DIAGNOSIS — L290 Pruritus ani: Secondary | ICD-10-CM | POA: Diagnosis not present

## 2024-05-14 NOTE — Patient Instructions (Addendum)
 Please call Dr skeet to schedule follow up with meningioma.  We will await rectal skin culture prior to further treatment.

## 2024-05-14 NOTE — Progress Notes (Signed)
 Assessment & Plan:  Aortic atherosclerosis (HCC) -     Lipid panel; Future  Paroxysmal supraventricular tachycardia (HCC) -     CBC with Differential/Platelet; Future -     Comprehensive metabolic panel with GFR; Future -     TSH; Future  Yeast detected -     WOUND CULTURE -     WET PREP FOR TRICH, YEAST, CLUE  Meningioma (HCC) Assessment & Plan: Overdue for follow-up surveillance meningioma.  Patient understands to reach out to neurology to schedule this appointment   Rectal itching Assessment & Plan: No external hemorrhoid on exam.  Erythema surrounding rectum.  Concern for yeast.  Patient is allergic to oral Diflucan.  She tried topical ketoconazole  which worsened symptoms.  Will await skin culture, wet prep.  Consider zinc oxide barrier cream, short course of topical steroid.   Gastroesophageal reflux disease, unspecified whether esophagitis present Assessment & Plan: Chronic, stable.  Nausea improved with Tums.  No alarm features at this time.  Controlled with dietary measures.  Pending colonoscopy, EGD.  Will follow   Other orders -     [id number]     Return precautions given.   Risks, benefits, and alternatives of the medications and treatment plan prescribed today were discussed, and patient expressed understanding.   Education regarding symptom management and diagnosis given to patient on AVS either electronically or printed.  Return in about 6 months (around 11/14/2024) for Fasting labs in 2-3 weeks.  Rollene Northern, FNP  Subjective:    Patient ID: [name], female    DOB: [birth year], 73 y.o.   MRN: [id number]  CC: Arthea Nobel is a 73 y.o. female who presents today for follow up.   HPI: Nausea is improved with tums.  Denies weight loss, blood in stool.   Acid reflux is better controlled with dietary changes, avoidance of tomato and reduced sweet tea consumption.   Rare use of NSAIDs No alcohol.  She complains of itching around the  rectum. Concern for 'yeast'. Describes white discharge 'dots'.   Hemorrhoids are internal. No rectal bleeding.   Miralax every other day.  Tried ketoconazole  on rectum and feels medication made rectum more red.    She has virus 2 months ago; room started to spin like a carousel and nausea started.  She has chronic  Consult with GI 04/14/24 Jonette Primmer  Colonoscopy  and EGD ordered Discussed constipation , hemorrhoids, nausea Started pepcid 20mg  BID Dc dulcolax; start miralax Advised zinc oxide for conservative management of hemorrhoids.   US  ab 09/07/2019 normal  Allergies: Diflucan [fluconazole], Tdap [tetanus-diphth-acell pertussis], Chlorpheniramine-pseudoeph, Codeine, Diphth-acell pertussis-tetanus, Ezetimibe , Propantheline, Propantheline bromide, Propoxyphene, Sulfa antibiotics, Sulfonamide derivatives, Tetanus antitoxin, Esomeprazole magnesium, Gadolinium derivatives, Meperidine hcl, Pneumococcal vaccine, Pneumovax 23 [pneumococcal vac polyvalent], and Propoxyphene n-acetaminophen Current Outpatient Medications on File Prior to Visit  Medication Sig Dispense Refill   BYSTOLIC  5 MG tablet TAKE 1 TABLET (5 MG TOTAL) BY MOUTH DAILY. MAY TAKE EXTRA 1 TABLET (5 MG )AS NEEDED FOR TACHYCARDIA 180 tablet 3   doxycycline  (PERIOSTAT ) 20 MG tablet Take 1 tablet (20 mg total) by mouth 2 (two) times daily. Take with food. (Patient taking differently: Take 20 mg by mouth 2 (two) times daily as needed. Take with food.) 60 tablet 5   ibuprofen  (ADVIL ,MOTRIN ) 600 MG tablet Take 1 tablet (600 mg total) by mouth every 8 (eight) hours as needed. 20 tablet 0   latanoprost (XALATAN) 0.005 % ophthalmic solution SMARTSIG:In Eye(s)     levothyroxine (  SYNTHROID, LEVOTHROID) 88 MCG tablet Take 1 tablet 6 days a week.     Multiple Vitamin (MULTIVITAMIN PO) Take 1 tablet by mouth daily.     simvastatin  (ZOCOR ) 20 MG tablet TAKE 1 TABLET BY MOUTH EVERY DAY IN THE EVENING 90 tablet 3   traZODone  (DESYREL ) 50 MG  tablet Take 0.5-1 tablets (25-50 mg total) by mouth at bedtime. 30 tablet 3   tacrolimus  (PROTOPIC ) 0.1 % ointment Apply to affected areas of hands twice daily as needed for dermatitis (Patient not taking: Reported on 05/14/2024) 60 g 0   No current facility-administered medications on file prior to visit.    Review of Systems  Constitutional:  Negative for chills and fever.  Respiratory:  Negative for cough.   Cardiovascular:  Negative for chest pain and palpitations.  Gastrointestinal:  Positive for constipation (chronic) and rectal pain. Negative for abdominal pain, anal bleeding, blood in stool, nausea (resolved) and vomiting.  Neurological:  Negative for headaches.      Objective:    BP 136/78   Pulse 78   Temp 97.8 F (36.6 C) (Oral)   Ht 5' 7.5 (1.715 m)   Wt 145 lb (65.8 kg)   SpO2 95%   BMI 22.38 kg/m  BP Readings from Last 3 Encounters:  05/14/24 136/78  02/03/24 110/80  10/14/23 131/89   Wt Readings from Last 3 Encounters:  05/14/24 145 lb (65.8 kg)  04/21/24 148 lb (67.1 kg)  02/03/24 152 lb 12.8 oz (69.3 kg)    Physical Exam Vitals reviewed.  Constitutional:      Appearance: She is well-developed.  Eyes:     Conjunctiva/sclera: Conjunctivae normal.  Cardiovascular:     Rate and Rhythm: Normal rate and regular rhythm.     Pulses: Normal pulses.     Heart sounds: Normal heart sounds.  Pulmonary:     Effort: Pulmonary effort is normal.     Breath sounds: Normal breath sounds. No wheezing, rhonchi or rales.  Genitourinary:     Comments: Diffuse erythema without white exudate. No external hemorrhoid, skin tag.  Skin:    General: Skin is warm and dry.  Neurological:     Mental Status: She is alert.  Psychiatric:        Speech: Speech normal.        Behavior: Behavior normal.        Thought Content: Thought content normal.

## 2024-05-15 ENCOUNTER — Other Ambulatory Visit (INDEPENDENT_AMBULATORY_CARE_PROVIDER_SITE_OTHER)

## 2024-05-15 DIAGNOSIS — I700 Atherosclerosis of aorta: Secondary | ICD-10-CM | POA: Diagnosis not present

## 2024-05-15 DIAGNOSIS — I471 Supraventricular tachycardia, unspecified: Secondary | ICD-10-CM

## 2024-05-15 LAB — LIPID PANEL
Cholesterol: 155 mg/dL (ref 0–200)
HDL: 80.3 mg/dL (ref >39.00)
LDL Cholesterol: 62 mg/dL (ref 0–99)
NonHDL: 74.35
Total CHOL/HDL Ratio: 2
Triglycerides: 62 mg/dL (ref 0.0–149.0)
VLDL: 12.4 mg/dL (ref 0.0–40.0)

## 2024-05-15 LAB — COMPREHENSIVE METABOLIC PANEL WITH GFR
ALT: 16 U/L (ref 0–35)
AST: 20 U/L (ref 0–37)
Albumin: 4.1 g/dL (ref 3.5–5.2)
Alkaline Phosphatase: 83 U/L (ref 39–117)
BUN: 7 mg/dL (ref 6–23)
CO2: 29 meq/L (ref 19–32)
Calcium: 9.3 mg/dL (ref 8.4–10.5)
Chloride: 103 meq/L (ref 96–112)
Creatinine, Ser: 0.64 mg/dL (ref 0.40–1.20)
GFR: 87.18 mL/min (ref >60.00)
Glucose, Bld: 95 mg/dL (ref 70–99)
Potassium: 4.1 meq/L (ref 3.5–5.1)
Sodium: 139 meq/L (ref 135–145)
Total Bilirubin: 0.8 mg/dL (ref 0.2–1.2)
Total Protein: 6.6 g/dL (ref 6.0–8.3)

## 2024-05-15 LAB — TSH: TSH: 0.62 u[IU]/mL (ref 0.35–5.50)

## 2024-05-15 LAB — CBC WITH DIFFERENTIAL/PLATELET
Basophils Absolute: 0 K/uL (ref 0.0–0.1)
Basophils Relative: 0.4 % (ref 0.0–3.0)
Eosinophils Absolute: 0.1 K/uL (ref 0.0–0.7)
Eosinophils Relative: 1.5 % (ref 0.0–5.0)
HCT: 39.9 % (ref 36.0–46.0)
Hemoglobin: 13.4 g/dL (ref 12.0–15.0)
Lymphocytes Relative: 31.1 % (ref 12.0–46.0)
Lymphs Abs: 1.3 K/uL (ref 0.7–4.0)
MCHC: 33.6 g/dL (ref 30.0–36.0)
MCV: 96.4 fl (ref 78.0–100.0)
Monocytes Absolute: 0.3 K/uL (ref 0.1–1.0)
Monocytes Relative: 6.4 % (ref 3.0–12.0)
Neutro Abs: 2.6 K/uL (ref 1.4–7.7)
Neutrophils Relative %: 60.6 % (ref 43.0–77.0)
Platelets: 165 K/uL (ref 150.0–400.0)
RBC: 4.14 Mil/uL (ref 3.87–5.11)
RDW: 13.2 % (ref 11.5–15.5)
WBC: 4.3 K/uL (ref 4.0–10.5)

## 2024-05-17 LAB — WOUND CULTURE
MICRO NUMBER:: 16770630
RESULT:: NORMAL
SPECIMEN QUALITY:: ADEQUATE

## 2024-05-18 ENCOUNTER — Ambulatory Visit: Payer: Self-pay | Admitting: Family

## 2024-05-18 DIAGNOSIS — L290 Pruritus ani: Secondary | ICD-10-CM | POA: Insufficient documentation

## 2024-05-18 DIAGNOSIS — K219 Gastro-esophageal reflux disease without esophagitis: Secondary | ICD-10-CM | POA: Insufficient documentation

## 2024-05-18 LAB — TIQ-NTM

## 2024-05-18 NOTE — Assessment & Plan Note (Addendum)
 Chronic, stable.  Nausea improved with Tums.  No alarm features at this time.  Controlled with dietary measures.  Pending colonoscopy, EGD.  Will follow

## 2024-05-18 NOTE — Assessment & Plan Note (Signed)
 Overdue for follow-up surveillance meningioma.  Patient understands to reach out to neurology to schedule this appointment

## 2024-05-18 NOTE — Assessment & Plan Note (Addendum)
 No external hemorrhoid on exam.  Erythema surrounding rectum.  Concern for yeast.  Patient is allergic to oral Diflucan.  She tried topical ketoconazole  which worsened symptoms.  Will await skin culture, wet prep.  Consider zinc oxide barrier cream, short course of topical steroid.

## 2024-05-20 NOTE — Telephone Encounter (Signed)
 Copied from CRM #[id number]. Topic: General - Other >> May 19, 2024  4:49 PM Lavanda D wrote: Reason for CRM: Arland is returning a call from [place], she said that she checked under the aerobic culture for yeast and there was none. The orange aptima swab is only for vaginal sourced tests, She is still waiting for a follow-up from a lab tech. Sari can give a call back tomorrow and ask directly for [employer].

## 2024-06-01 ENCOUNTER — Ambulatory Visit: Admitting: Dermatology

## 2024-06-01 DIAGNOSIS — L821 Other seborrheic keratosis: Secondary | ICD-10-CM

## 2024-06-01 DIAGNOSIS — L304 Erythema intertrigo: Secondary | ICD-10-CM | POA: Diagnosis not present

## 2024-06-01 DIAGNOSIS — Z1283 Encounter for screening for malignant neoplasm of skin: Secondary | ICD-10-CM | POA: Diagnosis not present

## 2024-06-01 DIAGNOSIS — L814 Other melanin hyperpigmentation: Secondary | ICD-10-CM

## 2024-06-01 DIAGNOSIS — I781 Nevus, non-neoplastic: Secondary | ICD-10-CM | POA: Diagnosis not present

## 2024-06-01 DIAGNOSIS — W908XXA Exposure to other nonionizing radiation, initial encounter: Secondary | ICD-10-CM | POA: Diagnosis not present

## 2024-06-01 DIAGNOSIS — L729 Follicular cyst of the skin and subcutaneous tissue, unspecified: Secondary | ICD-10-CM

## 2024-06-01 DIAGNOSIS — L578 Other skin changes due to chronic exposure to nonionizing radiation: Secondary | ICD-10-CM | POA: Diagnosis not present

## 2024-06-01 DIAGNOSIS — D1801 Hemangioma of skin and subcutaneous tissue: Secondary | ICD-10-CM | POA: Diagnosis not present

## 2024-06-01 DIAGNOSIS — D692 Other nonthrombocytopenic purpura: Secondary | ICD-10-CM

## 2024-06-01 DIAGNOSIS — L820 Inflamed seborrheic keratosis: Secondary | ICD-10-CM

## 2024-06-01 DIAGNOSIS — L720 Epidermal cyst: Secondary | ICD-10-CM | POA: Diagnosis not present

## 2024-06-01 DIAGNOSIS — L719 Rosacea, unspecified: Secondary | ICD-10-CM

## 2024-06-01 DIAGNOSIS — D229 Melanocytic nevi, unspecified: Secondary | ICD-10-CM

## 2024-06-01 MED ORDER — HYDROCORTISONE 2.5 % EX CREA: TOPICAL_CREAM | CUTANEOUS | 2 refills | Status: DC

## 2024-06-01 NOTE — Progress Notes (Addendum)
 Follow-Up Visit   Subjective  Meghan Mack is a 73 y.o. female who presents for the following: Skin Cancer Screening and Full Body Skin Exam  The patient presents for Total-Body Skin Exam (TBSE) for skin cancer screening and mole check. The patient has spots, moles and lesions to be evaluated, some may be new or changing. She has a rash at the perianal area, off and on for a few weeks, itchy at times. Patient used a different kind of wipe in that area. She tried ketoconazole  2% cream, improved but patient thinks the area is discolored. She has rosacea, controlled with doxycycline  20 MG bid and metronidazole  cream, as needed. She has an itchy spot on her neck.   The following portions of the chart were reviewed this encounter and updated as appropriate: medications, allergies, medical history  Review of Systems:  No other skin or systemic complaints except as noted in HPI or Assessment and Plan.  Objective  Well appearing patient in no apparent distress; mood and affect are within normal limits.  A full examination was performed including scalp, head, eyes, ears, nose, lips, neck, chest, axillae, abdomen, back, buttocks, bilateral upper extremities, bilateral lower extremities, hands, feet, fingers, toes, fingernails, and toenails. All findings within normal limits unless otherwise noted below.   Relevant physical exam findings are noted in the Assessment and Plan.  R neck Erythematous stuck-on, waxy papule or plaque  Assessment & Plan   SKIN CANCER SCREENING PERFORMED TODAY.  ACTINIC DAMAGE - Chronic condition, secondary to cumulative UV/sun exposure - diffuse scaly erythematous macules with underlying dyspigmentation - Recommend daily broad spectrum sunscreen SPF 30+ to sun-exposed areas, reapply every 2 hours as needed.  - Staying in the shade or wearing long sleeves, sun glasses (UVA+UVB protection) and wide brim hats (4-inch brim around the entire circumference of the hat)  are also recommended for sun protection.  - Call for new or changing lesions.  LENTIGINES, SEBORRHEIC KERATOSES, HEMANGIOMAS - Benign normal skin lesions - Benign-appearing - Call for any changes  MELANOCYTIC NEVI - Tan-brown and/or pink-flesh-colored symmetric macules and papules - Benign appearing on exam today - Observation - Call clinic for new or changing moles - Recommend daily use of broad spectrum spf 30+ sunscreen to sun-exposed areas.   ROSACEA Exam Slight erythema nose and malar cheeks  Chronic and persistent condition with duration or expected duration over one year. Condition is improving with treatment but not currently at goal.   Rosacea is a chronic progressive skin condition usually affecting the face of adults, causing redness and/or acne bumps. It is treatable but not curable. It sometimes affects the eyes (ocular rosacea) as well. It may respond to topical and/or systemic medication and can flare with stress, sun exposure, alcohol, exercise, topical steroids (including hydrocortisone /cortisone 10) and some foods.  Daily application of broad spectrum spf 30+ sunscreen to face is recommended to reduce flares.  Treatment Plan Continue doxycycline  20 mg take 1 po bid with food, pt takes prn for flares Continue metronidazole  0.75 % gel qhs to face, bid with flares   Doxycycline  should be taken with food to prevent nausea. Do not lay down for 30 minutes after taking. Be cautious with sun exposure and use good sun protection while on this medication. Pregnant women should not take this medication.   Long term medication management. Patient is using long term (months to years) prescription medication to control their dermatologic condition. These medications require periodic monitoring to evaluate for efficacy and side  effects   EPIDERMAL INCLUSION CYST Exam: Firm flesh papule R inf chin 5 mm   Benign-appearing. Exam most consistent with an epidermal inclusion cyst.  Discussed that a cyst is a benign growth that can grow over time and sometimes get irritated or inflamed. Recommend observation if it is not bothersome. Discussed option of surgical excision to remove it if it is growing, symptomatic, or other changes noted. Please call for new or changing lesions so they can be evaluated.  Purpura - Chronic; persistent and recurrent.  Treatable, but not curable. - Violaceous macules and patches, right hand - Benign - Related to trauma, age, sun damage and/or use of blood thinners, chronic use of topical and/or oral steroids - Observe - Can use OTC arnica containing moisturizer such as Dermend Bruise Formula if desired - Call for worsening or other concerns  Spider Veins - Dilated blue, purple or red veins at the lower extremities - Reassured - Smaller vessels can be treated by sclerotherapy (a procedure to inject a medicine into the veins to make them disappear) if desired, but the treatment is not covered by insurance. Larger vessels may be covered if symptomatic and we would refer to vascular surgeon if treatment desired.  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 Condition is symptomatic/ bothersome to patient. Not currently at goal.   Intertrigo is a chronic recurrent rash that occurs in skin fold areas that may be associated with friction; heat; moisture; yeast; fungus; and bacteria.  It is exacerbated by increased movement / activity; sweating; and higher atmospheric temperature.  Use of an absorbant powder such as Zeasorb AF powder or other OTC antifungal powder to the area daily can prevent rash recurrence. Other options to help keep the area dry include blow drying the area after bathing or using antiperspirant products such as Duradry sweat minimizing gel.  Treatment Plan: Continue ketoconazole  2% cream once to twice daily as  needed.  Start hydrocortisone  2.5% cream once to twice daily as needed for itch. May use up to 2 weeks at a time. Dsp 30 g 2 Rf. D/C wipes to area. May use Desitin ointment as a skin protectant prn  INFLAMED SEBORRHEIC KERATOSIS R neck Symptomatic, irritating, patient would like treated. Destruction of lesion - R neck  Destruction method: cryotherapy   Informed consent: discussed and consent obtained   Lesion destroyed using liquid nitrogen: Yes   Region frozen until ice ball extended beyond lesion: Yes   Outcome: patient tolerated procedure well with no complications   Post-procedure details: wound care instructions given   Additional details:  Prior to procedure, discussed risks of blister formation, small wound, skin dyspigmentation, or rare scar following cryotherapy. Recommend Vaseline ointment to treated areas while healing.   Return in about 1 year (around 06/01/2025) for TBSE.  IAndrea Kerns, CMA, am acting as scribe for Rexene Rattler, MD .   Documentation: I have reviewed the above documentation for accuracy and completeness, and I agree with the above.  Rexene Rattler, MD

## 2024-06-01 NOTE — Patient Instructions (Signed)
 Cryotherapy Aftercare  Wash gently with soap and water everyday.   Apply Vaseline and Band-Aid daily until healed.   Rosacea  What is rosacea? Rosacea (say: ro-zay-sha) is a common skin disease that usually begins as a trend of flushing or blushing easily.  As rosacea progresses, a persistent redness in the center of the face will develop and may gradually spread beyond the nose and cheeks to the forehead and chin.  In some cases, the ears, chest, and back could be affected.  Rosacea may appear as tiny blood vessels or small red bumps that occur in crops.  Frequently they can contain pus, and are called "pustules".  If the bumps do not contain pus, they are referred to as "papules".  Rarely, in prolonged, untreated cases of rosacea, the oil glands of the nose and cheeks may become permanently enlarged.  This is called rhinophyma, and is seen more frequently in men.  Signs and Risks In its beginning stages, rosacea tends to come and go, which makes it difficult to recognize.  It can start as intermittent flushing of the face.  Eventually, blood vessels may become permanently visible.  Pustules and papules can appear, but can be mistaken for adult acne.  People of all races, ages, genders and ethnic groups are at risk of developing rosacea.  However, it is more common in women (especially around menopause) and adults with fair skin between the ages of 18 and 31.  Treatment Dermatologists typically recommend a combination of treatments to effectively manage rosacea.  Treatment can improve symptoms and may stop the progression of the rosacea.  Treatment may involve both topical and oral medications.  The tetracycline antibiotics are often used for their anti-inflammatory effect; however, because of the possibility of developing antibiotic resistance, they should not be used long term at full dose.  For dilated blood vessels the options include electrodessication (uses electric current through a small  needle), laser treatment, and cosmetics to hide the redness.   With all forms of treatment, improvement is a slow process, and patients may not see any results for the first 3-4 weeks.  It is very important to avoid the sun and other triggers.  Patients must wear sunscreen daily.  Skin Care Instructions: Cleanse the skin with a mild soap such as CeraVe cleanser, Cetaphil cleanser, or Dove soap once or twice daily as needed. Moisturize with Eucerin Redness Relief Daily Perfecting Lotion (has a subtle green tint), CeraVe Moisturizing Cream, or Oil of Olay Daily Moisturizer with sunscreen every morning and/or night as recommended. Makeup should be "non-comedogenic" (won't clog pores) and be labeled "for sensitive skin". Good choices for cosmetics are: Neutrogena, Almay, and Physician's Formula.  Any product with a green tint tends to offset a red complexion. If your eyes are dry and irritated, use artificial tears 2-3 times per day and cleanse the eyelids daily with baby shampoo.  Have your eyes examined at least every 2 years.  Be sure to tell your eye doctor that you have rosacea. Alcoholic beverages tend to cause flushing of the skin, and may make rosacea worse. Always wear sunscreen, protect your skin from extreme hot and cold temperatures, and avoid spicy foods, hot drinks, and mechanical irritation such as rubbing, scrubbing, or massaging the face.  Avoid harsh skin cleansers, cleansing masks, astringents, and exfoliation. If a particular product burns or makes your face feel tight, then it is likely to flare your rosacea. If you are having difficulty finding a sunscreen that you can tolerate,  you may try switching to a chemical-free sunscreen.  These are ones whose active ingredient is zinc oxide or titanium dioxide only.  They should also be fragrance free, non-comedogenic, and labeled for sensitive skin. Rosacea triggers may vary from person to person.  There are a variety of foods that have been  reported to trigger rosacea.  Some patients find that keeping a diary of what they were doing when they flared helps them avoid triggers.   Due to recent changes in healthcare laws, you may see results of your pathology and/or laboratory studies on MyChart before the doctors have had a chance to review them. We understand that in some cases there may be results that are confusing or concerning to you. Please understand that not all results are received at the same time and often the doctors may need to interpret multiple results in order to provide you with the best plan of care or course of treatment. Therefore, we ask that you please give [place] 2 business days to thoroughly review all your results before contacting the office for clarification. Should we see a critical lab result, you will be contacted sooner.   If You Need Anything After Your Visit  If you have any questions or concerns for your doctor, please call our main line at [phone] and press option 4 to reach your doctor's medical assistant. If no one answers, please leave a voicemail as directed and we will return your call as soon as possible. Messages left after 4 pm will be answered the following business day.   You may also send [place] a message via MyChart. We typically respond to MyChart messages within 1-2 business days.  For prescription refills, please ask your pharmacy to contact our office. Our fax number is [phone].  If you have an urgent issue when the clinic is closed that cannot wait until the next business day, you can page your doctor at the number below.    Please note that while we do our best to be available for urgent issues outside of office hours, we are not available 24/7.   If you have an urgent issue and are unable to reach [place], you may choose to seek medical care at your doctor's office, retail clinic, urgent care center, or emergency room.  If you have a medical emergency, please immediately call 911 or  go to the emergency department.  Pager Numbers  - Dr. Gwen Pounds: [phone]  - Dr. Roseanne Reno: [phone]  - Dr. Katrinka Blazing: [phone]   In the event of inclement weather, please call our main line at [phone] for an update on the status of any delays or closures.  Dermatology Medication Tips: Please keep the boxes that topical medications come in in order to help keep track of the instructions about where and how to use these. Pharmacies typically print the medication instructions only on the boxes and not directly on the medication tubes.   If your medication is too expensive, please contact our office at [phone] option 4 or send [place] a message through MyChart.   We are unable to tell what your co-pay for medications will be in advance as this is different depending on your insurance coverage. However, we may be able to find a substitute medication at lower cost or fill out paperwork to get insurance to cover a needed medication.   If a prior authorization is required to get your medication covered by your insurance company, please allow [place] 1-2 business days to complete this  process.  Drug prices often vary depending on where the prescription is filled and some pharmacies may offer cheaper prices.  The website www.goodrx.com contains coupons for medications through different pharmacies. The prices here do not account for what the cost may be with help from insurance (it may be cheaper with your insurance), but the website can give you the price if you did not use any insurance.  - You can print the associated coupon and take it with your prescription to the pharmacy.  - You may also stop by our office during regular business hours and pick up a GoodRx coupon card.  - If you need your prescription sent electronically to a different pharmacy, notify our office through [facility] or by phone at [phone] option 4.     Si Usted Necesita Algo Despus de Su  Visita  Tambin puede enviarnos un mensaje a travs de Clinical cytogeneticist. Por lo general respondemos a los mensajes de MyChart en el transcurso de 1 a 2 das hbiles.  Para renovar recetas, por favor pida a su farmacia que se ponga en contacto con nuestra oficina. Annie Sable de fax es [place] [phone].  Si tiene un asunto urgente cuando la clnica est cerrada y que no puede esperar hasta el siguiente da hbil, puede llamar/localizar a su doctor(a) al nmero que aparece a continuacin.   Por favor, tenga en cuenta que aunque hacemos todo lo posible para estar disponibles para asuntos urgentes fuera del horario de [place], no estamos disponibles las 24 horas del da, los 7 [address]  [address] de la [place].   Si tiene un problema urgente y no puede comunicarse con nosotros, puede optar por buscar atencin mdica  en el consultorio de su doctor(a), en una clnica privada, en un centro de atencin urgente o en una sala de emergencias.  Si tiene Engineer, drilling, por favor llame inmediatamente al 911 o vaya a la sala de emergencias.  Nmeros de bper  - Dr. Gwen Pounds: [phone]  - Dra. Roseanne Reno: [phone]  - Dr. Katrinka Blazing: [phone]   En caso de inclemencias del tiempo, por favor llame a Lacy Duverney principal al [phone] para una actualizacin sobre el [place] de cualquier retraso o cierre.  Consejos para la medicacin en dermatologa: Por favor, guarde las cajas en las que vienen los medicamentos de uso tpico para ayudarle a seguir las instrucciones sobre dnde y cmo usarlos. Las farmacias generalmente imprimen las instrucciones del medicamento slo en las cajas y no directamente en los tubos del [place].   Si su medicamento es muy caro, por favor, pngase en contacto con Rolm Gala llamando al [phone] y presione la opcin 4 o envenos un mensaje a travs de Clinical cytogeneticist.   No podemos decirle cul ser su copago por los medicamentos por adelantado ya que esto es diferente dependiendo de  la cobertura de su seguro. Sin embargo, es posible que podamos encontrar un medicamento sustituto a Audiological scientist un formulario para que el seguro cubra el medicamento que se considera necesario.   Si se requiere una autorizacin previa para que su compaa de seguros [place] su medicamento, por favor permtanos de 1 a 2 das hbiles para completar [address]  Los precios de los medicamentos varan con frecuencia dependiendo del Environmental consultant de dnde se surte la receta y alguna farmacias pueden ofrecer precios ms baratos.  El sitio web www.goodrx.com tiene cupones para medicamentos de Health and safety inspector. Los precios aqu no tienen en cuenta lo que podra costar con la ayuda del seguro (  puede ser ms barato con su seguro), pero el sitio web puede darle el precio si no Visual merchandiser.  - Puede imprimir el cupn correspondiente y llevarlo con su receta a la farmacia.  - Tambin puede pasar por nuestra oficina durante el horario de atencin regular y Education officer, museum una tarjeta de cupones de GoodRx.  - Si necesita que su receta se enve electrnicamente a una farmacia diferente, informe a nuestra oficina a travs de MyChart de [place] o por telfono llamando al [phone] y presione la opcin 4.

## 2024-06-17 DIAGNOSIS — E559 Vitamin D deficiency, unspecified: Secondary | ICD-10-CM | POA: Diagnosis not present

## 2024-06-17 DIAGNOSIS — E890 Postprocedural hypothyroidism: Secondary | ICD-10-CM | POA: Diagnosis not present

## 2024-06-24 DIAGNOSIS — E559 Vitamin D deficiency, unspecified: Secondary | ICD-10-CM | POA: Diagnosis not present

## 2024-06-24 DIAGNOSIS — E890 Postprocedural hypothyroidism: Secondary | ICD-10-CM | POA: Diagnosis not present

## 2024-06-29 NOTE — Progress Notes (Unsigned)
 Cardiology Office Note  Date:  06/30/2024   ID:  Meghan Mack, Meghan Mack 01/18/1951, MRN [id number]  PCP:  Dineen Rollene MATSU, FNP   Chief Complaint  Patient presents with   12 month follow up     Doing well.     HPI:  Meghan Mack is a very pleasant 73 year old woman with a history of  palpitations,  anxiety,  remote history of chest pain,  hyperlipidemia  30 day monitor in 2014 showed APCs,short runs atrial tachycardia/SVT with rate 166  hypertension  mild coronary calcification of the LAD and RCA on CT Coronary calcium score of 183. MRI with moderate stenosis cerebral vascular disease Chronic lymphedema, wears compressions who presents for routine followup  Of her coronary calcifications, Aortic atherosclerosis, tachycardia , peripheral arterial disease  LOV 8/24 In general she reports that she is doing well with no complaints Occasionally wakes at night with palpitations, elevated heart rate when laying on her left side  Continues to take Bystolic  5 mg in the morning Active, exercises at the [facility] Denies shortness of breath or chest pain on exertion No near-syncope or syncope  Gets her medication to the [facility]  Prior studies reviewed Zio monitor reviewed showing rare episodes short runs of SVT/atrial tachycardia  Labs reviewed Total chol 155, LDL 62  EKG personally reviewed by myself on todays visit EKG Interpretation Date/Time:  Tuesday June 30 2024 08:53:34 EDT Ventricular Rate:  95 PR Interval:  142 QRS Duration:  66 QT Interval:  362 QTC Calculation: 454 R Axis:   -11  Text Interpretation: Normal sinus rhythm Low voltage QRS When compared with ECG of 10-Jun-2023 10:53, No significant change was found Confirmed by Perla Lye [phone]) on 06/30/2024 8:58:18 AM    CT coronary calcium scoring July 2023 Atherosclerosis of thoracic aorta Coronary calcium score of 183. This was 72nd percentile for age and sex matched control. CAC 100-299 in LAD,  RCA.  Husband passed, prostate cancer Compression hose in place for chronic lower extremity lymphedema  Tolerating statin, simvastatin  Previously on Zetia , stopped secondary to stomach issues  Prior CT scan reviewed showing mild coronary calcification LAD, RCA, aortic atherosclerosis mild  Past medical history reviewed MRI, was having sharp pain in head Moderate stenosis right P2 segment Moderate to severe stenosis left internal carotid artery at the skull base and moderate stenosis right internal carotid artery at the skull base. Mild atherosclerotic disease right anterior cerebral Artery.  PMH:   has a past medical history of Actinic keratosis, Anemia, Colonic polyp, Constipation, COPD (chronic obstructive pulmonary disease) (HCC), Hyperlipidemia, IBS (irritable bowel syndrome), Kidney cysts, Lung nodule, Meningioma (HCC), Nausea, Palpitations, and Tonsillar cyst.  PSH:    Past Surgical History:  Procedure Laterality Date   CARDIAC CATHETERIZATION  02/25/2007   COLONOSCOPY     ESOPHAGOGASTRODUODENOSCOPY  10/1997 & 08/2005   FOOT SURGERY  04/25/2007   right   THYROIDECTOMY  11/2005   VAGINAL HYSTERECTOMY     ovaries intact    Current Outpatient Medications  Medication Sig Dispense Refill   BYSTOLIC  5 MG tablet TAKE 1 TABLET (5 MG TOTAL) BY MOUTH DAILY. MAY TAKE EXTRA 1 TABLET (5 MG )AS NEEDED FOR TACHYCARDIA 180 tablet 3   ibuprofen  (ADVIL ,MOTRIN ) 600 MG tablet Take 1 tablet (600 mg total) by mouth every 8 (eight) hours as needed. 20 tablet 0   latanoprost (XALATAN) 0.005 % ophthalmic solution SMARTSIG:In Eye(s)     levothyroxine (SYNTHROID, LEVOTHROID) 88 MCG tablet Take 1 tablet 6 days a  week.     Multiple Vitamin (MULTIVITAMIN PO) Take 1 tablet by mouth daily.     simvastatin  (ZOCOR ) 20 MG tablet TAKE 1 TABLET BY MOUTH EVERY DAY IN THE EVENING 90 tablet 3   doxycycline  (PERIOSTAT ) 20 MG tablet Take 20 mg by mouth 2 (two) times daily. (Patient not taking: Reported on  06/30/2024)     traZODone  (DESYREL ) 50 MG tablet Take 0.5-1 tablets (25-50 mg total) by mouth at bedtime. 30 tablet 3   No current facility-administered medications for this visit.    Allergies:   Diflucan [fluconazole], Tdap [tetanus-diphth-acell pertussis], Chlorpheniramine-pseudoeph, Codeine, Diphth-acell pertussis-tetanus, Esomeprazole magnesium, Ezetimibe , Meperidine hcl, Propantheline, Propantheline bromide, Propoxyphene, Sulfa antibiotics, Sulfasalazine, Sulfonamide derivatives, Tetanus antitoxin, Esomeprazole magnesium, Gadolinium derivatives, Pneumococcal vaccine, Pneumovax 23 [pneumococcal vac polyvalent], and Propoxyphene n-acetaminophen   Social History:  The patient  reports that she quit smoking about 26 years ago. Her smoking use included cigarettes. She started smoking about 36 years ago. She has a 2.5 pack-year smoking history. She has never used smokeless tobacco. She reports that she does not drink alcohol and does not use drugs.   Family History:   family history includes Cancer in her brother, father, maternal aunt, maternal grandfather, and mother; Emphysema in her father; Heart disease in her father.    Review of Systems: Review of Systems  Constitutional: Negative.   HENT: Negative.    Respiratory: Negative.    Cardiovascular: Negative.   Gastrointestinal: Negative.   Musculoskeletal: Negative.   Neurological: Negative.   Psychiatric/Behavioral: Negative.    All other systems reviewed and are negative.   PHYSICAL EXAM: VS:  BP 120/80 (BP Location: Left Arm, Patient Position: Sitting, Cuff Size: Normal)   Pulse 95   Ht 5' 7.5 (1.715 m)   Wt 142 lb (64.4 kg)   SpO2 95%   BMI 21.91 kg/m  , BMI Body mass index is 21.91 kg/m. Constitutional:  oriented to person, place, and time. No distress.  HENT:  Head: Grossly normal Eyes:  no discharge. No scleral icterus.  Neck: No JVD, no carotid bruits  Cardiovascular: Regular rate and rhythm, no murmurs  appreciated Pulmonary/Chest: Clear to auscultation bilaterally, no wheezes or rales Abdominal: Soft.  no distension.  no tenderness.  Musculoskeletal: Normal range of motion Neurological:  normal muscle tone. Coordination normal. No atrophy Skin: Skin warm and dry Psychiatric: normal affect, pleasant  Recent Labs: 05/15/2024: ALT 16; BUN 7; Creatinine, Ser 0.64; Hemoglobin 13.4; Platelets 165.0; Potassium 4.1; Sodium 139; TSH 0.62    Lipid Panel Lab Results  Component Value Date   CHOL 155 05/15/2024   HDL 80.30 05/15/2024   LDLCALC 62 05/15/2024   TRIG 62.0 05/15/2024      Wt Readings from Last 3 Encounters:  06/30/24 142 lb (64.4 kg)  05/14/24 145 lb (65.8 kg)  04/21/24 148 lb (67.1 kg)     ASSESSMENT AND PLAN:  Peripheral arterial disease Mild aortic atherosclerosis Prior history of smoking Tolerating simvastatin , off of Zetia  secondary to stomach issues, cholesterol numbers  Tachycardia - stay on bytolic 5 mg daily  May need to increase up to 5 twice daily for continued palpitations in the middle of the night And general symptoms relatively well-controlled   Obstructive sleep apnea - Reports sleeping relatively well Occasionally waking with palpitations  TOBACCO ABUSE - Plan: EKG 12-Lead Prior history of smoking Stopped smoking  Mixed hyperlipidemia - Did not tolerate Zetia , Tolerating simvastatin  20 milligrams daily  CAD Mild coronary calcification and mild to moderate diffuse  aortic atherosclerosis A1c well-controlled Cholesterol reasonably well-controlled   Orders Placed This Encounter  Procedures   EKG 12-Lead     Signed, Velinda Lunger, M.D., Ph.D. 06/30/2024  [facility] [place], [place] [phone]

## 2024-06-30 ENCOUNTER — Encounter: Payer: Self-pay | Admitting: Cardiovascular Disease

## 2024-06-30 ENCOUNTER — Encounter: Payer: Self-pay | Admitting: Family

## 2024-06-30 ENCOUNTER — Ambulatory Visit: Attending: Cardiovascular Disease | Admitting: Cardiovascular Disease

## 2024-06-30 VITALS — BP 120/80 | HR 95 | Ht 67.5 in | Wt 142.0 lb

## 2024-06-30 DIAGNOSIS — E782 Mixed hyperlipidemia: Secondary | ICD-10-CM | POA: Diagnosis not present

## 2024-06-30 DIAGNOSIS — I739 Peripheral vascular disease, unspecified: Secondary | ICD-10-CM

## 2024-06-30 DIAGNOSIS — I700 Atherosclerosis of aorta: Secondary | ICD-10-CM

## 2024-06-30 DIAGNOSIS — G4733 Obstructive sleep apnea (adult) (pediatric): Secondary | ICD-10-CM | POA: Diagnosis not present

## 2024-06-30 DIAGNOSIS — I471 Supraventricular tachycardia, unspecified: Secondary | ICD-10-CM

## 2024-06-30 DIAGNOSIS — I2510 Atherosclerotic heart disease of native coronary artery without angina pectoris: Secondary | ICD-10-CM

## 2024-06-30 DIAGNOSIS — F17200 Nicotine dependence, unspecified, uncomplicated: Secondary | ICD-10-CM | POA: Diagnosis not present

## 2024-06-30 MED ORDER — SIMVASTATIN 20 MG PO TABS: ORAL_TABLET | ORAL | 3 refills | Status: DC

## 2024-06-30 MED ORDER — BYSTOLIC 5 MG PO TABS: ORAL_TABLET | ORAL | 11 refills | Status: AC

## 2024-06-30 NOTE — Patient Instructions (Signed)

## 2024-07-27 ENCOUNTER — Ambulatory Visit: Payer: Self-pay

## 2024-07-27 DIAGNOSIS — Z860101 Personal history of adenomatous and serrated colon polyps: Secondary | ICD-10-CM | POA: Diagnosis not present

## 2024-07-27 DIAGNOSIS — Z09 Encounter for follow-up examination after completed treatment for conditions other than malignant neoplasm: Secondary | ICD-10-CM | POA: Diagnosis not present

## 2024-07-27 DIAGNOSIS — D123 Benign neoplasm of transverse colon: Secondary | ICD-10-CM | POA: Diagnosis not present

## 2024-07-27 DIAGNOSIS — K449 Diaphragmatic hernia without obstruction or gangrene: Secondary | ICD-10-CM | POA: Diagnosis not present

## 2024-07-27 DIAGNOSIS — R1033 Periumbilical pain: Secondary | ICD-10-CM | POA: Diagnosis not present

## 2024-07-27 DIAGNOSIS — R1013 Epigastric pain: Secondary | ICD-10-CM | POA: Diagnosis not present

## 2024-07-27 DIAGNOSIS — R110 Nausea: Secondary | ICD-10-CM | POA: Diagnosis not present

## 2024-07-31 DIAGNOSIS — H40003 Preglaucoma, unspecified, bilateral: Secondary | ICD-10-CM | POA: Diagnosis not present

## 2024-07-31 DIAGNOSIS — H2513 Age-related nuclear cataract, bilateral: Secondary | ICD-10-CM | POA: Diagnosis not present

## 2024-07-31 DIAGNOSIS — H43813 Vitreous degeneration, bilateral: Secondary | ICD-10-CM | POA: Diagnosis not present

## 2024-07-31 DIAGNOSIS — H04123 Dry eye syndrome of bilateral lacrimal glands: Secondary | ICD-10-CM | POA: Diagnosis not present

## 2024-08-26 ENCOUNTER — Other Ambulatory Visit: Payer: Self-pay | Admitting: Cardiovascular Disease

## 2024-09-01 NOTE — Telephone Encounter (Signed)
 open in error

## 2024-10-12 ENCOUNTER — Ambulatory Visit: Payer: Self-pay

## 2024-10-12 NOTE — Telephone Encounter (Signed)
 FYI Only or Action Required?: FYI only for provider: appointment scheduled on 12.30.25.  Patient was last seen in primary care on 05/14/2024 by Dineen Rollene MATSU, FNP.  Called Nurse Triage reporting Mass.  Symptoms began a week ago.  Interventions attempted: Other: compression stockings.  Symptoms are: unchanged.  Triage Disposition: See PCP When Office is Open (Within 3 Days)  Patient/caregiver understands and will follow disposition?: Yes   Copied from CRM #[id number]. Topic: Clinical - Red Word Triage >> Oct 12, 2024  2:24 PM Hadassah PARAS wrote: Red Word that prompted transfer to Nurse Triage: Pt called in to set up an app due to hair shredding after having COVID. Pt also has a lump on leg near shin bone. Transferring to NT Reason for Disposition  [1] Small swelling or lump AND [2] unexplained AND [3] present > 1 week  Answer Assessment - Initial Assessment Questions Lump next to shin on left leg. States it is a knot at the top and then goes along her shin. States you can't see it but can feel it. Denies any pain with walking, redness, swelling or fever. Been there about a week, she has tried compression stockings to see if that helps.  She is also having hair loss post covid. She states she has lost lots of hair. Not breaking like it was. She states typically she has to go in every 6-8 weeks to have hair thinned but when she went last time her hair dresser told her no due to the hair loss.    1. APPEARANCE of SWELLING: What does it look like?     Knot on top and then long 2. SIZE: How large is the swelling? (e.g., inches, cm; or compare to size of pinhead, tip of pen, eraser, coin, pea, grape, ping pong ball)      Couldn't describe 3. LOCATION: Where is the swelling located?     Beside left shin 4. ONSET: When did the swelling start?     About a week ago 5. COLOR: What color is it? Is there more than one color?     no 6. PAIN: Is there any pain? If Yes, ask: How bad  is the pain? (Scale 1-10; or mild, moderate, severe)       Sore a times 7. CAUSE: What do you think caused the swelling?     unknown 8 OTHER SYMPTOMS: Do you have any other symptoms? (e.g., fever)     denies  Protocols used: Skin Lump or Localized Swelling-A-AH

## 2024-10-13 ENCOUNTER — Ambulatory Visit: Admitting: Nurse Practitioner

## 2024-10-13 ENCOUNTER — Ambulatory Visit

## 2024-10-13 ENCOUNTER — Encounter: Payer: Self-pay | Admitting: Nurse Practitioner

## 2024-10-13 VITALS — BP 128/80 | HR 85 | Temp 97.8°F | Ht 67.5 in | Wt 139.2 lb

## 2024-10-13 DIAGNOSIS — R2242 Localized swelling, mass and lump, left lower limb: Secondary | ICD-10-CM | POA: Diagnosis not present

## 2024-10-13 DIAGNOSIS — L659 Nonscarring hair loss, unspecified: Secondary | ICD-10-CM | POA: Diagnosis not present

## 2024-10-13 NOTE — Progress Notes (Signed)
 " Leron Glance, NP-C Phone: [phone]  Bentleigh Waren is a 73 y.o. female who presents today for leg mass and hair thinning.   Discussed the use of AI scribe software for clinical note transcription with the patient, who gave verbal consent to proceed.  History of Present Illness   Eleshia Wooley is a 73 year old female with Sjogren's syndrome who presents with a lump on her shin and hair loss after COVID-19 infection.  She noticed a lump on her shin a few weeks ago while shaving her legs. It is described as a palpable knot that feels attached to the bone but is not visible. The lump is tender, especially after massaging her legs due to swelling. No trauma to the area is reported, and the lump has not changed in size. She wears compression hose, which she feels helps with the tenderness.  She experienced significant hair loss following a COVID-19 infection in September. Her hair, previously thick, became notably thin, especially at the back of her head. Some new hair growth is occurring, with 'baby hairs' appearing. No recent labs except for a normal thyroid  check with her Endocrinologist. She has also experienced weight loss, losing fifteen pounds, attributed to decreased appetite following COVID-19 and subsequent vertigo.  She has a history of Sjogren's syndrome, contributing to dry skin and scalp, potentially exacerbating her hair issues. She has tried changing shampoos and using low heat to dry her hair to manage the condition. She has not taken biotin due to potential cardiac side effects as advised by a dermatologist.      Tobacco Use History[1]  Medications Ordered Prior to Encounter[2]   ROS see history of present illness  Objective  Physical Exam Vitals:   10/13/24 1339  BP: 128/80  Pulse: 85  Temp: 97.8 F (36.6 C)  SpO2: 98%    BP Readings from Last 3 Encounters:  10/13/24 128/80  06/30/24 120/80  05/14/24 136/78   Wt Readings from Last 3 Encounters:   10/13/24 139 lb 3.2 oz (63.1 kg)  06/30/24 142 lb (64.4 kg)  05/14/24 145 lb (65.8 kg)    Physical Exam Constitutional:      General: She is not in acute distress.    Appearance: Normal appearance.  HENT:     Head: Normocephalic.  Cardiovascular:     Rate and Rhythm: Normal rate and regular rhythm.     Heart sounds: Normal heart sounds.  Pulmonary:     Effort: Pulmonary effort is normal.     Breath sounds: Normal breath sounds.  Musculoskeletal:     Left lower leg: Deformity and tenderness present.       Legs:     Comments: Palpable mass noted along left shin. Mild tenderness. No erythema or swelling   Skin:    General: Skin is warm and dry.  Neurological:     General: No focal deficit present.     Mental Status: She is alert.  Psychiatric:        Mood and Affect: Mood normal.        Behavior: Behavior normal.      Assessment/Plan: Please see individual problem list.  Localized swelling, mass, or lump of lower extremity, left Assessment & Plan: A tender lump on her shin is palpable. No recent injury. No erythema or swelling noted. X-ray left tib/fib for further evaluation. Advised continued ice, compression and elevation. Further work up pending results.   Orders: -     DG Tibia/Fibula Left; Future  Hair thinning Assessment & Plan: Post-COVID-19 hair loss is likely due to post-viral shedding, though new hair growth is noted. Discussed treatments such as biotin and finasteride, declined due to potential side effects and interactions with other medications. Check lab work as outlined. She recently had normal thyroid  labs with her Endocrinologist. Recommended over-the-counter hair strengthening and growth treatments such as Nutrafol or DIVI, and advised using hair masks and strengthening conditioners.   Orders: -     CBC with Differential/Platelet -     Comprehensive metabolic panel with GFR -     Vitamin B12 -     IBC + Ferritin     Return if symptoms worsen or  fail to improve.   Leron Glance, NP-C [facility] [place]     [1]  Social History Tobacco Use  Smoking Status Former   Current packs/day: 0.00   Average packs/day: 0.3 packs/day for 10.0 years (2.5 ttl pk-yrs)   Types: Cigarettes   Start date: 09/26/1987   Quit date: 09/25/1997   Years since quitting: 27.0  Smokeless Tobacco Never  [2]  Current Outpatient Medications on File Prior to Visit  Medication Sig Dispense Refill   BYSTOLIC  5 MG tablet TAKE 1 TABLET (5 MG TOTAL) BY MOUTH DAILY. MAY TAKE EXTRA 1 TABLET (5 MG )AS NEEDED FOR TACHYCARDIA 60 tablet 11   latanoprost (XALATAN) 0.005 % ophthalmic solution SMARTSIG:In Eye(s)     levothyroxine (SYNTHROID, LEVOTHROID) 88 MCG tablet Take 1 tablet 6 days a week.     Multiple Vitamin (MULTIVITAMIN PO) Take 1 tablet by mouth daily.     simvastatin  (ZOCOR ) 20 MG tablet TAKE 1 TABLET BY MOUTH EVERY DAY IN THE EVENING 90 tablet 3   No current facility-administered medications on file prior to visit.   "

## 2024-10-13 NOTE — Assessment & Plan Note (Signed)
 A tender lump on her shin is palpable. No recent injury. No erythema or swelling noted. X-ray left tib/fib for further evaluation. Advised continued ice, compression and elevation. Further work up pending results.

## 2024-10-13 NOTE — Assessment & Plan Note (Signed)
 Post-COVID-19 hair loss is likely due to post-viral shedding, though new hair growth is noted. Discussed treatments such as biotin and finasteride, declined due to potential side effects and interactions with other medications. Check lab work as outlined. She recently had normal thyroid  labs with her Endocrinologist. Recommended over-the-counter hair strengthening and growth treatments such as Nutrafol or DIVI, and advised using hair masks and strengthening conditioners.

## 2024-10-14 LAB — COMPREHENSIVE METABOLIC PANEL WITH GFR
ALT: 24 U/L (ref 3–35)
AST: 24 U/L (ref 5–37)
Albumin: 4.2 g/dL (ref 3.5–5.2)
Alkaline Phosphatase: 88 U/L (ref 39–117)
BUN: 9 mg/dL (ref 6–23)
CO2: 29 meq/L (ref 19–32)
Calcium: 9.2 mg/dL (ref 8.4–10.5)
Chloride: 103 meq/L (ref 96–112)
Creatinine, Ser: 0.65 mg/dL (ref 0.40–1.20)
GFR: 86.6 mL/min
Glucose, Bld: 90 mg/dL (ref 70–99)
Potassium: 4.2 meq/L (ref 3.5–5.1)
Sodium: 140 meq/L (ref 135–145)
Total Bilirubin: 0.5 mg/dL (ref 0.2–1.2)
Total Protein: 6.8 g/dL (ref 6.0–8.3)

## 2024-10-14 LAB — IBC + FERRITIN
Ferritin: 46.8 ng/mL (ref 10.0–291.0)
Iron: 124 ug/dL (ref 42–145)
Saturation Ratios: 32.7 % (ref 20.0–50.0)
TIBC: 379.4 ug/dL (ref 250.0–450.0)
Transferrin: 271 mg/dL (ref 212.0–360.0)

## 2024-10-14 LAB — CBC WITH DIFFERENTIAL/PLATELET
Basophils Absolute: 0.1 K/uL (ref 0.0–0.1)
Basophils Relative: 1.4 % (ref 0.0–3.0)
Eosinophils Absolute: 0.1 K/uL (ref 0.0–0.7)
Eosinophils Relative: 1.6 % (ref 0.0–5.0)
HCT: 39 % (ref 36.0–46.0)
Hemoglobin: 13.3 g/dL (ref 12.0–15.0)
Lymphocytes Relative: 35.8 % (ref 12.0–46.0)
Lymphs Abs: 1.4 K/uL (ref 0.7–4.0)
MCHC: 34.2 g/dL (ref 30.0–36.0)
MCV: 97 fl (ref 78.0–100.0)
Monocytes Absolute: 0.4 K/uL (ref 0.1–1.0)
Monocytes Relative: 9.1 % (ref 3.0–12.0)
Neutro Abs: 2 K/uL (ref 1.4–7.7)
Neutrophils Relative %: 52.1 % (ref 43.0–77.0)
Platelets: 171 K/uL (ref 150.0–400.0)
RBC: 4.02 Mil/uL (ref 3.87–5.11)
RDW: 12.9 % (ref 11.5–15.5)
WBC: 3.9 K/uL — ABNORMAL LOW (ref 4.0–10.5)

## 2024-10-14 LAB — VITAMIN B12: Vitamin B-12: 464 pg/mL (ref 211–911)

## 2024-10-19 ENCOUNTER — Other Ambulatory Visit: Payer: Self-pay | Admitting: Family

## 2024-10-19 DIAGNOSIS — Z1231 Encounter for screening mammogram for malignant neoplasm of breast: Secondary | ICD-10-CM

## 2024-10-21 ENCOUNTER — Ambulatory Visit: Payer: Self-pay | Admitting: Nurse Practitioner

## 2024-10-21 ENCOUNTER — Encounter: Payer: Self-pay | Admitting: Nurse Practitioner

## 2024-10-21 NOTE — Telephone Encounter (Signed)
 Pt read provider message on today in regards to these results

## 2024-10-26 ENCOUNTER — Telehealth: Payer: Self-pay

## 2024-10-26 NOTE — Telephone Encounter (Signed)
 Copied from CRM #[id number]. Topic: General - Other >> Oct 26, 2024  9:54 AM Amber H wrote: Reason for CRM: Patient stated that she has not heard back from anyone regarding if the MRI for her legs would have dye in it because she had a reaction to it. Wanted it performed at the [facility] off of [address]    Meghan [phone]

## 2024-10-27 ENCOUNTER — Ambulatory Visit
Admission: RE | Admit: 2024-10-27 | Discharge: 2024-10-27 | Disposition: A | Source: Ambulatory Visit | Attending: Family

## 2024-10-27 DIAGNOSIS — Z1231 Encounter for screening mammogram for malignant neoplasm of breast: Secondary | ICD-10-CM

## 2024-10-29 ENCOUNTER — Other Ambulatory Visit: Payer: Self-pay | Admitting: Nurse Practitioner

## 2024-10-29 DIAGNOSIS — R2242 Localized swelling, mass and lump, left lower limb: Secondary | ICD-10-CM

## 2024-10-29 DIAGNOSIS — M899 Disorder of bone, unspecified: Secondary | ICD-10-CM

## 2024-11-14 ENCOUNTER — Other Ambulatory Visit

## 2024-11-16 ENCOUNTER — Ambulatory Visit: Admitting: Family

## 2024-11-28 ENCOUNTER — Other Ambulatory Visit

## 2024-12-01 ENCOUNTER — Ambulatory Visit: Admitting: Family

## 2025-04-26 ENCOUNTER — Ambulatory Visit

## 2025-06-01 ENCOUNTER — Encounter: Admitting: Dermatology
